# Patient Record
Sex: Male | Born: 1945 | Hispanic: No | Marital: Married | Smoking: Current every day smoker
Health system: Southern US, Community
[De-identification: ages and names within clinical notes are randomized; demographics above are authoritative.]

## PROBLEM LIST (undated history)

## (undated) DIAGNOSIS — J189 Pneumonia, unspecified organism: Secondary | ICD-10-CM

## (undated) DIAGNOSIS — I2510 Atherosclerotic heart disease of native coronary artery without angina pectoris: Secondary | ICD-10-CM

## (undated) DIAGNOSIS — K831 Obstruction of bile duct: Secondary | ICD-10-CM

## (undated) DIAGNOSIS — C801 Malignant (primary) neoplasm, unspecified: Secondary | ICD-10-CM

## (undated) DIAGNOSIS — E7800 Pure hypercholesterolemia, unspecified: Secondary | ICD-10-CM

## (undated) DIAGNOSIS — K5900 Constipation, unspecified: Secondary | ICD-10-CM

## (undated) DIAGNOSIS — I10 Essential (primary) hypertension: Secondary | ICD-10-CM

## (undated) DIAGNOSIS — I219 Acute myocardial infarction, unspecified: Secondary | ICD-10-CM

## (undated) HISTORY — PX: CORONARY STENT PLACEMENT: SHX1402

## (undated) HISTORY — DX: Malignant (primary) neoplasm, unspecified: C80.1

## (undated) HISTORY — PX: APPENDECTOMY: SHX54

## (undated) HISTORY — PX: CORONARY ANGIOPLASTY: SHX604

---

## 2008-11-24 ENCOUNTER — Emergency Department (HOSPITAL_COMMUNITY): Admission: EM | Admit: 2008-11-24 | Discharge: 2008-11-24 | Payer: Self-pay | Admitting: Family Medicine

## 2009-12-29 ENCOUNTER — Ambulatory Visit: Payer: Self-pay | Admitting: Family Medicine

## 2009-12-29 ENCOUNTER — Encounter (INDEPENDENT_AMBULATORY_CARE_PROVIDER_SITE_OTHER): Payer: Self-pay | Admitting: Family Medicine

## 2009-12-29 LAB — CONVERTED CEMR LAB
ALT: 14 units/L (ref 0–53)
Alkaline Phosphatase: 79 units/L (ref 39–117)
Basophils Absolute: 0 10*3/uL (ref 0.0–0.1)
Basophils Relative: 0 % (ref 0–1)
Creatinine, Ser: 0.91 mg/dL (ref 0.40–1.50)
Glucose, Bld: 83 mg/dL (ref 70–99)
LDL Cholesterol: 93 mg/dL (ref 0–99)
MCHC: 34.1 g/dL (ref 30.0–36.0)
Neutro Abs: 3.9 10*3/uL (ref 1.7–7.7)
Neutrophils Relative %: 51 % (ref 43–77)
PSA: 1.15 ng/mL (ref 0.10–4.00)
RDW: 14 % (ref 11.5–15.5)
Sodium: 134 meq/L — ABNORMAL LOW (ref 135–145)
Total Bilirubin: 0.5 mg/dL (ref 0.3–1.2)
Total CHOL/HDL Ratio: 3.7
Total Protein: 7.9 g/dL (ref 6.0–8.3)
Triglycerides: 183 mg/dL — ABNORMAL HIGH (ref <150)
VLDL: 37 mg/dL (ref 0–40)

## 2012-05-01 ENCOUNTER — Encounter (HOSPITAL_COMMUNITY): Payer: Self-pay

## 2012-05-01 ENCOUNTER — Inpatient Hospital Stay (HOSPITAL_COMMUNITY)
Admission: EM | Admit: 2012-05-01 | Discharge: 2012-05-23 | DRG: 438 | Disposition: A | Discharge status: Home Health | Payer: Medicaid Other | Attending: Cardiovascular Disease | Admitting: Cardiovascular Disease

## 2012-05-01 ENCOUNTER — Emergency Department (HOSPITAL_COMMUNITY): Payer: Medicaid Other

## 2012-05-01 DIAGNOSIS — E871 Hypo-osmolality and hyponatremia: Secondary | ICD-10-CM | POA: Diagnosis present

## 2012-05-01 DIAGNOSIS — T50995A Adverse effect of other drugs, medicaments and biological substances, initial encounter: Secondary | ICD-10-CM | POA: Diagnosis present

## 2012-05-01 DIAGNOSIS — R7402 Elevation of levels of lactic acid dehydrogenase (LDH): Secondary | ICD-10-CM | POA: Diagnosis present

## 2012-05-01 DIAGNOSIS — I10 Essential (primary) hypertension: Secondary | ICD-10-CM | POA: Diagnosis present

## 2012-05-01 DIAGNOSIS — R634 Abnormal weight loss: Secondary | ICD-10-CM | POA: Diagnosis present

## 2012-05-01 DIAGNOSIS — K831 Obstruction of bile duct: Secondary | ICD-10-CM | POA: Diagnosis present

## 2012-05-01 DIAGNOSIS — R17 Unspecified jaundice: Secondary | ICD-10-CM | POA: Diagnosis present

## 2012-05-01 DIAGNOSIS — Q619 Cystic kidney disease, unspecified: Secondary | ICD-10-CM

## 2012-05-01 DIAGNOSIS — E785 Hyperlipidemia, unspecified: Secondary | ICD-10-CM | POA: Diagnosis present

## 2012-05-01 DIAGNOSIS — F17200 Nicotine dependence, unspecified, uncomplicated: Secondary | ICD-10-CM | POA: Diagnosis present

## 2012-05-01 DIAGNOSIS — R748 Abnormal levels of other serum enzymes: Secondary | ICD-10-CM | POA: Diagnosis present

## 2012-05-01 DIAGNOSIS — K4090 Unilateral inguinal hernia, without obstruction or gangrene, not specified as recurrent: Secondary | ICD-10-CM | POA: Diagnosis present

## 2012-05-01 DIAGNOSIS — K5730 Diverticulosis of large intestine without perforation or abscess without bleeding: Secondary | ICD-10-CM | POA: Diagnosis present

## 2012-05-01 DIAGNOSIS — K838 Other specified diseases of biliary tract: Secondary | ICD-10-CM | POA: Diagnosis present

## 2012-05-01 DIAGNOSIS — R7401 Elevation of levels of liver transaminase levels: Secondary | ICD-10-CM | POA: Diagnosis present

## 2012-05-01 DIAGNOSIS — Z6825 Body mass index (BMI) 25.0-25.9, adult: Secondary | ICD-10-CM

## 2012-05-01 DIAGNOSIS — Z79899 Other long term (current) drug therapy: Secondary | ICD-10-CM

## 2012-05-01 DIAGNOSIS — E8809 Other disorders of plasma-protein metabolism, not elsewhere classified: Secondary | ICD-10-CM | POA: Diagnosis present

## 2012-05-01 DIAGNOSIS — K859 Acute pancreatitis without necrosis or infection, unspecified: Principal | ICD-10-CM | POA: Diagnosis present

## 2012-05-01 DIAGNOSIS — E875 Hyperkalemia: Secondary | ICD-10-CM | POA: Diagnosis present

## 2012-05-01 DIAGNOSIS — K5900 Constipation, unspecified: Secondary | ICD-10-CM | POA: Diagnosis not present

## 2012-05-01 DIAGNOSIS — D490 Neoplasm of unspecified behavior of digestive system: Secondary | ICD-10-CM | POA: Diagnosis present

## 2012-05-01 DIAGNOSIS — N179 Acute kidney failure, unspecified: Secondary | ICD-10-CM | POA: Diagnosis not present

## 2012-05-01 DIAGNOSIS — E861 Hypovolemia: Secondary | ICD-10-CM | POA: Diagnosis present

## 2012-05-01 DIAGNOSIS — I2510 Atherosclerotic heart disease of native coronary artery without angina pectoris: Secondary | ICD-10-CM | POA: Diagnosis present

## 2012-05-01 DIAGNOSIS — E46 Unspecified protein-calorie malnutrition: Secondary | ICD-10-CM | POA: Diagnosis present

## 2012-05-01 DIAGNOSIS — K8020 Calculus of gallbladder without cholecystitis without obstruction: Secondary | ICD-10-CM | POA: Diagnosis present

## 2012-05-01 DIAGNOSIS — I959 Hypotension, unspecified: Secondary | ICD-10-CM | POA: Diagnosis present

## 2012-05-01 DIAGNOSIS — K315 Obstruction of duodenum: Secondary | ICD-10-CM | POA: Diagnosis present

## 2012-05-01 HISTORY — DX: Atherosclerotic heart disease of native coronary artery without angina pectoris: I25.10

## 2012-05-01 HISTORY — DX: Essential (primary) hypertension: I10

## 2012-05-01 HISTORY — DX: Pure hypercholesterolemia, unspecified: E78.00

## 2012-05-01 LAB — CBC WITH DIFFERENTIAL/PLATELET
Basophils Relative: 0 % (ref 0–1)
Eosinophils Absolute: 0.2 10*3/uL (ref 0.0–0.7)
Eosinophils Relative: 2 % (ref 0–5)
MCH: 28.3 pg (ref 26.0–34.0)
MCHC: 34.6 g/dL (ref 30.0–36.0)
MCV: 81.9 fL (ref 78.0–100.0)
Monocytes Relative: 14 % — ABNORMAL HIGH (ref 3–12)
Neutrophils Relative %: 67 % (ref 43–77)
Platelets: 192 10*3/uL (ref 150–400)

## 2012-05-01 LAB — COMPREHENSIVE METABOLIC PANEL
Albumin: 3.7 g/dL (ref 3.5–5.2)
Alkaline Phosphatase: 400 U/L — ABNORMAL HIGH (ref 39–117)
BUN: 18 mg/dL (ref 6–23)
Calcium: 9.6 mg/dL (ref 8.4–10.5)
GFR calc Af Amer: 88 mL/min — ABNORMAL LOW (ref >90)
Potassium: 3.8 mEq/L (ref 3.5–5.1)
Sodium: 134 mEq/L — ABNORMAL LOW (ref 135–145)
Total Protein: 7.8 g/dL (ref 6.0–8.3)

## 2012-05-01 LAB — LIPASE, BLOOD: Lipase: >3000 U/L — ABNORMAL HIGH (ref 11–59)

## 2012-05-01 MED ORDER — ZOLPIDEM TARTRATE 5 MG PO TABS: 5.0000 mg | ORAL_TABLET | Freq: Every evening | ORAL | Status: DC | PRN

## 2012-05-01 MED ORDER — DEXTROSE-NACL 5-0.45 % IV SOLN
INTRAVENOUS | Status: DC
  Administered 2012-05-01 – 2012-05-02 (×3): via INTRAVENOUS

## 2012-05-01 MED ORDER — ONDANSETRON HCL 4 MG/2ML IJ SOLN
4.0000 mg | Freq: Four times a day (QID) | INTRAMUSCULAR | Status: DC | PRN
  Administered 2012-05-01 – 2012-05-04 (×3): 4 mg via INTRAVENOUS
  Filled 2012-05-01 (×3): qty 2

## 2012-05-01 MED ORDER — LISINOPRIL 20 MG PO TABS
20.0000 mg | ORAL_TABLET | Freq: Every day | ORAL | Status: DC
  Administered 2012-05-01 – 2012-05-07 (×7): 20 mg via ORAL
  Filled 2012-05-01 (×8): qty 1

## 2012-05-01 MED ORDER — SODIUM CHLORIDE 0.9 % IJ SOLN
3.0000 mL | Freq: Two times a day (BID) | INTRAMUSCULAR | Status: DC
  Administered 2012-05-01 – 2012-05-23 (×8): 3 mL via INTRAVENOUS

## 2012-05-01 MED ORDER — ONDANSETRON HCL 4 MG PO TABS: 4.0000 mg | ORAL_TABLET | Freq: Four times a day (QID) | ORAL | Status: DC | PRN

## 2012-05-01 MED ORDER — ASPIRIN 81 MG PO CHEW
324.0000 mg | CHEWABLE_TABLET | Freq: Once | ORAL | Status: AC
  Administered 2012-05-01: 324 mg via ORAL
  Filled 2012-05-01: qty 4

## 2012-05-01 MED ORDER — SODIUM CHLORIDE 0.9 % IV BOLUS (SEPSIS)
500.0000 mL | Freq: Once | INTRAVENOUS | Status: AC
  Administered 2012-05-01: 500 mL via INTRAVENOUS

## 2012-05-01 MED ORDER — IOHEXOL 300 MG/ML  SOLN
100.0000 mL | Freq: Once | INTRAMUSCULAR | Status: AC | PRN
  Administered 2012-05-01: 100 mL via INTRAVENOUS

## 2012-05-01 MED ORDER — OXYCODONE HCL 5 MG PO TABS
5.0000 mg | ORAL_TABLET | ORAL | Status: DC | PRN
  Administered 2012-05-01 – 2012-05-23 (×15): 5 mg via ORAL
  Filled 2012-05-01 (×15): qty 1

## 2012-05-01 NOTE — ED Notes (Signed)
Pt complains of epigastric pain and shortness of breath since last night, no vomiting but states that he feels nauseated

## 2012-05-01 NOTE — ED Provider Notes (Signed)
History     CSN: [id number]  Arrival date & time 05/01/12  1322   First MD Initiated Contact with Patient 05/01/12 1355      Chief Complaint  Patient presents with  . Abdominal Pain  . Shortness of Breath    (Consider location/radiation/quality/duration/timing/severity/associated sxs/prior treatment) HPI Pt reports several days of increasing, moderate aching epigastric abdominal pain occasionally radiating into his chest, associated with nausea but no vomiting and abdominal bloating. No change in bowels. Pain occasionally moves into his chest and having some mild itching. He went to see his PCP recently for itching all over and given a course of prednisone. Denies any cough or fever.   Past Medical History  Diagnosis Date  . Hypertension   . High cholesterol     History reviewed. No pertinent past surgical history.  History reviewed. No pertinent family history.  History  Substance Use Topics  . Smoking status: Not on file  . Smokeless tobacco: Not on file  . Alcohol Use: No      Review of Systems All other systems reviewed and are negative except as noted in HPI.   Allergies  Review of patient's allergies indicates no known allergies.  Home Medications   Current Outpatient Rx  Name Route Sig Dispense Refill  . ATORVASTATIN CALCIUM 10 MG PO TABS Oral Take 10 mg by mouth daily.    [name] LISINOPRIL 20 MG PO TABS Oral Take 20 mg by mouth daily.    [name] PRAVASTATIN SODIUM 40 MG PO TABS Oral Take 40 mg by mouth daily.    [name] PREDNISONE 10 MG PO TABS Oral Take 10-20 mg by mouth daily. Taper dosing     Started at 20mg  for first four days then 10mg  for rest of days.     Was taking for itching.      BP 160/72  Pulse 82  Temp 97.8 F (36.6 C) (Oral)  Resp 20  SpO2 99%  Physical Exam  Nursing note and vitals reviewed. Constitutional: He is oriented to person, place, and time. He appears well-developed and well-nourished.  HENT:  Head: Normocephalic and atraumatic.  Eyes:  EOM are normal. Pupils are equal, round, and reactive to light.  Neck: Normal range of motion. Neck supple.  Cardiovascular: Normal rate, normal heart sounds and intact distal pulses.   Pulmonary/Chest: Effort normal and breath sounds normal.  Abdominal: Bowel sounds are normal. He exhibits no distension. There is tenderness (epigastric tenderness). There is no rebound and no guarding.  Musculoskeletal: Normal range of motion. He exhibits no edema and no tenderness.  Neurological: He is alert and oriented to person, place, and time. He has normal strength. No cranial nerve deficit or sensory deficit.  Skin: Skin is warm and dry. No rash noted.  Psychiatric: He has a normal mood and affect.    ED Course  Procedures (including critical care time)  Labs Reviewed  CBC WITH DIFFERENTIAL - Abnormal; Notable for the following:    RBC 6.18 (*)     Hemoglobin 17.5 (*)     Monocytes Relative 14 (*)     Monocytes Absolute 1.5 (*)     All other components within normal limits  COMPREHENSIVE METABOLIC PANEL - Abnormal; Notable for the following:    Sodium 134 (*)     AST 72 (*)     ALT 216 (*)     Alkaline Phosphatase 400 (*)     Total Bilirubin 2.9 (*)     GFR calc non Af  Amer 76 (*)     GFR calc Af Amer 88 (*)     All other components within normal limits  TROPONIN I  LIPASE, BLOOD   Dg Abd Acute W/chest  05/01/2012  *RADIOLOGY REPORT*  Clinical Data: Chest pain, abdominal pain  ACUTE ABDOMEN SERIES (ABDOMEN 2 VIEW & CHEST 1 VIEW)  Comparison: None.  Findings: Normal cardiac silhouette.  A fine reticular nodular pattern of left or right lower lobe.  Lungs are clear.  Lungs are hyperinflated.  There are no dilated loops of large or small bowel. Gas filled loops of large and small bowel.  There is gas in the rectum.  No pathologic calcifications.  IMPRESSION:  1. Reticular nodular pattern in the lower lobes suggest interstitial edema or infection.  Fine pulmonary edema could have similar  pattern. 2.  No evidence of bowel obstruction.  Gas filled loops of large and small bowel.   Original Report Authenticated By: Genevive Bi, M.D.      No diagnosis found.    MDM   Date: 05/01/2012  Rate: 84  Rhythm: normal sinus rhythm  QRS Axis: normal  Intervals: normal  ST/T Wave abnormalities: nonspecific T wave changes  Conduction Disutrbances:none  Narrative Interpretation:   Old EKG Reviewed: none available   CMP shows elevation in liver enzymes and bilirubin. Awaiting lipase which is elevated and being diluted in the Lab. Will send for CT. Discussed with Dr. Algie Coffer who will come to the ED to evaluate the patient.        Charles B. Bernette Mayers, MD 05/01/12 1538

## 2012-05-01 NOTE — ED Notes (Signed)
Dr Sheldon at bedside  

## 2012-05-01 NOTE — H&P (Signed)
Colin Castro is an 66 y.o. male.   Chief Complaint: Abdominal Pain HPI: Abdominal tightness and nausea x 1 week. No fever. Denies alcohol, fat intake or prior history of GB or pancreatic disease. No fever or cough or diarrhea.  Past Medical History  Diagnosis Date  . Hypertension   . High cholesterol       History reviewed. No pertinent past surgical history.  History reviewed. No pertinent family history. Social History:  reports that he has been smoking Cigarettes.  He has a 12 pack-year smoking history. He has never used smokeless tobacco. He reports that he does not drink alcohol or use illicit drugs.  Allergies: No Known Allergies   Results for orders placed during the hospital encounter of 05/01/12 (from the past 48 hour(s))  CBC WITH DIFFERENTIAL     Status: Abnormal   Collection Time   05/01/12  2:25 PM      Component Value Range Comment   WBC 10.3  4.0 - 10.5 K/uL    RBC 6.18 (*) 4.22 - 5.81 MIL/uL    Hemoglobin 17.5 (*) 13.0 - 17.0 g/dL    HCT 96.0  45.4 - 09.8 %    MCV 81.9  78.0 - 100.0 fL    MCH 28.3  26.0 - 34.0 pg    MCHC 34.6  30.0 - 36.0 g/dL    RDW 11.9  14.7 - 82.9 %    Platelets 192  150 - 400 K/uL    Neutrophils Relative 67  43 - 77 %    Neutro Abs 6.8  1.7 - 7.7 K/uL    Lymphocytes Relative 17  12 - 46 %    Lymphs Abs 1.8  0.7 - 4.0 K/uL    Monocytes Relative 14 (*) 3 - 12 %    Monocytes Absolute 1.5 (*) 0.1 - 1.0 K/uL    Eosinophils Relative 2  0 - 5 %    Eosinophils Absolute 0.2  0.0 - 0.7 K/uL    Basophils Relative 0  0 - 1 %    Basophils Absolute 0.0  0.0 - 0.1 K/uL   COMPREHENSIVE METABOLIC PANEL     Status: Abnormal   Collection Time   05/01/12  2:25 PM      Component Value Range Comment   Sodium 134 (*) 135 - 145 mEq/L    Potassium 3.8  3.5 - 5.1 mEq/L    Chloride 97  96 - 112 mEq/L    CO2 22  19 - 32 mEq/L    Glucose, Bld 82  70 - 99 mg/dL    BUN 18  6 - 23 mg/dL    Creatinine, Ser 5.62  0.50 - 1.35 mg/dL    Calcium 9.6  8.4 - 13.0  mg/dL    Total Protein 7.8  6.0 - 8.3 g/dL    Albumin 3.7  3.5 - 5.2 g/dL    AST 72 (*) 0 - 37 U/L    ALT 216 (*) 0 - 53 U/L    Alkaline Phosphatase 400 (*) 39 - 117 U/L    Total Bilirubin 2.9 (*) 0.3 - 1.2 mg/dL    GFR calc non Af Amer 76 (*) >90 mL/min    GFR calc Af Amer 88 (*) >90 mL/min   LIPASE, BLOOD     Status: Abnormal   Collection Time   05/01/12  2:25 PM      Component Value Range Comment   Lipase >3000 (*) 11 - 59 U/L  TROPONIN I     Status: Normal   Collection Time   05/01/12  2:25 PM      Component Value Range Comment   Troponin I <0.30  <0.30 ng/mL    Dg Abd Acute W/chest  05/01/2012  *RADIOLOGY REPORT*  Clinical Data: Chest pain, abdominal pain  ACUTE ABDOMEN SERIES (ABDOMEN 2 VIEW & CHEST 1 VIEW)  Comparison: None.  Findings: Normal cardiac silhouette.  A fine reticular nodular pattern of left or right lower lobe.  Lungs are clear.  Lungs are hyperinflated.  There are no dilated loops of large or small bowel. Gas filled loops of large and small bowel.  There is gas in the rectum.  No pathologic calcifications.  IMPRESSION:  1. Reticular nodular pattern in the lower lobes suggest interstitial edema or infection.  Fine pulmonary edema could have similar pattern. 2.  No evidence of bowel obstruction.  Gas filled loops of large and small bowel.   Original Report Authenticated By: Genevive Bi, M.D.     @ROS @ No weight gain or loss. No fever, wears reading glasses. No chest pain. No hepatitis. No gi bleed. No kidney stone. No CVA. No seizure disorder or psychiatric admission.   Blood pressure 160/72, pulse 82, temperature 97.8 F (36.6 C), temperature source Oral, resp. rate 20, SpO2 99.00%. Physical Exan: Constitutional: He is oriented to person, place, and time. He appears well-developed and well-nourished.  HENT:  Head: Normocephalic and atraumatic. Eyes: Manson Passey, EOM are normal. Pupils are equal, round, and reactive to light.  Neck: Normal range of motion. Neck  supple.  Cardiovascular: Normal rate, normal heart sounds and intact distal pulses.  Pulmonary/Chest: Effort normal and breath sounds normal.  Abdominal: Bowel sounds are normal. There is tenderness (epigastric tenderness). There is no rebound and no guarding.   Musculoskeletal: Normal range of motion. No edema and no tenderness.  Neurological: He is alert and oriented to person, place, and time. He has normal strength. No cranial nerve deficit or sensory deficit.  Skin: Skin is warm and dry. No rash noted.  Psychiatric: He has a normal mood and affect.   Assessment/Plan Acute pancreatitis R/O GB pancreatitis  Admit, IV fluids.  Estephani Popper S 05/01/2012, 5:32 PM

## 2012-05-01 NOTE — Progress Notes (Signed)
[facility] ED CM noted cm consult from admission RN for assistance because pt does not have insurance. CM spoke with pt who confirms he has an active orange card and sees Dr Algie Coffer  Pt reports no issues with obtaining his medications Male visitor assisted with translation and providing information for pt.

## 2012-05-01 NOTE — ED Notes (Signed)
Pt finishing second cup of CT contrast. Informed of plan of care. Verbalizes understanding and agreement. Urinal provided at pt request. Family friend at bedside.

## 2012-05-01 NOTE — ED Notes (Signed)
Report called to [facility] RN

## 2012-05-01 NOTE — ED Notes (Signed)
Attempted to call report x2 at 1840. Sec stated RN was on another line. Held for RN for . No one ever answered phone. Will attempt report x3 at shift change.

## 2012-05-01 NOTE — ED Notes (Signed)
Attempted to call report x1, RN unavailable. Awaiting return call.

## 2012-05-01 NOTE — ED Notes (Signed)
Pt reports epigastric pain x5-6 days. Sometimes radiates to chest or generalized abd. Denies n/v, shob, CP.

## 2012-05-02 LAB — BASIC METABOLIC PANEL
BUN: 20 mg/dL (ref 6–23)
CO2: 24 mEq/L (ref 19–32)
Chloride: 99 mEq/L (ref 96–112)
Creatinine, Ser: 1.18 mg/dL (ref 0.50–1.35)
Glucose, Bld: 98 mg/dL (ref 70–99)

## 2012-05-02 NOTE — Consult Note (Signed)
[facility] Consultation Note  Referring Provider = Primary Care Physician:  [name], MD  Reason for Consultation:  pancreatitis  HPI: Colin Castro is a 66 y.o. male whom we've been asked to see for pancreatitis and abnormal CT scan.  Patient was in static state of health until about one week ago.  At that time, he began having acute onset of epigastric pain and nausea/vomiting.  Pain at present has resolved.  He has no change in bowel habits, blood in stool.  His appetite has been a bit diminished, and he has lost about 5 lbs over the past several weeks.  CT showed pancreatic and biliary ductal dilatation, large gallstone, but no obvious inflammatory changes of pancreatitis despite markedly elevated pancreatic enzyme level.  He has never had pancreatitis before.  He does not consume alcohol.   Past Medical History  Diagnosis Date  . Hypertension   . High cholesterol     History reviewed. No pertinent past surgical history.  Prior to Admission medications   Medication Sig Start Date End Date Taking? Authorizing Provider  atorvastatin (LIPITOR) 10 MG tablet Take 10 mg by mouth daily.   Yes Historical Provider, MD  lisinopril (PRINIVIL,ZESTRIL) 20 MG tablet Take 20 mg by mouth daily.   Yes Historical Provider, MD  pravastatin (PRAVACHOL) 40 MG tablet Take 40 mg by mouth daily.   Yes Historical Provider, MD  predniSONE (DELTASONE) 10 MG tablet Take 10-20 mg by mouth daily. Taper dosing     Started at 20mg  for first four days then 10mg  for rest of days.     Was taking for itching.   Yes Historical Provider, MD    Current Facility-Administered Medications  Medication Dose Route Frequency Provider Last Rate Last Dose  . dextrose 5 %-0.45 % sodium chloride infusion   Intravenous Continuous [name], MD 100 mL/hr at 05/02/12 1610    . iohexol (OMNIPAQUE) 300 MG/ML solution 100 mL  100 mL Intravenous Once PRN Medication Radiologist, MD   100 mL at 05/01/12 1743  .  lisinopril (PRINIVIL,ZESTRIL) tablet 20 mg  20 mg Oral Daily [name], MD   20 mg at 05/02/12 0946  . ondansetron (ZOFRAN) tablet 4 mg  4 mg Oral Q6H PRN [name], MD       Or  . ondansetron (ZOFRAN) injection 4 mg  4 mg Intravenous Q6H PRN [name], MD   4 mg at 05/01/12 2102  . oxyCODONE (Oxy IR/ROXICODONE) immediate release tablet 5 mg  5 mg Oral Q4H PRN [name], MD   5 mg at 05/02/12 0438  . sodium chloride 0.9 % injection 3 mL  3 mL Intravenous Q12H [name], MD   3 mL at 05/01/12 2040  . zolpidem (AMBIEN) tablet 5 mg  5 mg Oral QHS PRN [name], MD        Allergies as of 05/01/2012  . (No Known Allergies)    History reviewed. No pertinent family history.  History   Social History  . Marital Status: Married    Spouse Name: N/A    Number of Children: N/A  . Years of Education: N/A   Occupational History  . Not on file.   Social History Main Topics  . Smoking status: Current Every Day Smoker -- 0.2 packs/day for 48 years    Types: Cigarettes  . Smokeless tobacco: Never Used  . Alcohol Use: No  . Drug Use: No  . Sexually Active: No  Other Topics Concern  . Not on file   Social History Narrative  . No narrative on file    Review of Systems: As per HPI; all others negative.  Physical Exam: Vital signs in last 24 hours: Temp:  [97.8 F (36.6 C)-98.4 F (36.9 C)] 98.4 F (36.9 C) (09/13 1449) Pulse Rate:  [59-61] 60  (09/13 1452) Resp:  [18-22] 18  (09/13 1449) BP: (96-151)/(49-83) 151/61 mmHg (09/13 1452) SpO2:  [95 %-100 %] 95 % (09/13 1452) Weight:  [67.8 kg (149 lb 7.6 oz)-70.5 kg (155 lb 6.8 oz)] 70.5 kg (155 lb 6.8 oz) (09/13 0538) Last BM Date: 04/30/12 General:   Alert,  Bit cachectic-appearing, older-appearing than stated age, is in NAD Head:  Normocephalic and atraumatic. Eyes:  Sclera clear; slight scleral icterus.   Conjunctiva slightly pale Ears:  Normal auditory acuity. Nose:  No deformity, discharge,  or  lesions. Mouth:  No deformity or lesions.  Oropharynx pink & somewhat dry.  Poor dentition, with couple very loose teeth Neck:  Supple Lungs:  Clear throughout to auscultation.   No wheezes, crackles, or rhonchi. No acute distress. Heart:  Regular rate and rhythm; no murmurs, clicks, rubs,  or gallops. Abdomen:  Soft, nondistended. No significant tenderness, even to deep palpation.  Active bowel sounds. No masses, hepatosplenomegaly or hernias noted. No peritonitis.    Msk:  Symmetrical without gross deformities. Normal posture. Pulses:  Normal pulses noted. Extremities:  Without clubbing or edema. Neurologic:  Alert and  oriented x4;  grossly normal neurologically. Cervical Nodes:  No significant cervical adenopathy. Psych:  Alert and cooperative. Normal mood and affect.   Lab Results:  Basename 05/01/12 1425  WBC 10.3  HGB 17.5*  HCT 50.6  PLT 192   BMET  Basename 05/02/12 0450 05/01/12 1425  NA 133* 134*  K 3.9 3.8  CL 99 97  CO2 24 22  GLUCOSE 98 82  BUN 20 18  CREATININE 1.18 1.01  CALCIUM 8.7 9.6   LFT  Basename 05/01/12 1425  PROT 7.8  ALBUMIN 3.7  AST 72*  ALT 216*  ALKPHOS 400*  BILITOT 2.9*  BILIDIR --  IBILI --   PT/INR No results found for this basename: LABPROT:2,INR:2 in the last 72 hours  Studies/Results: Ct Abdomen Pelvis W Contrast  05/01/2012  *RADIOLOGY REPORT*  Clinical Data: Epigastric pain, pancreatitis.  CT ABDOMEN AND PELVIS WITH CONTRAST  Technique:  Multidetector CT imaging of the abdomen and pelvis was performed following the standard protocol during bolus administration of intravenous contrast.  Contrast: OMNIPAQUE IOHEXOL 300 MG/ML  SOLN plain films of the abdomen 05/01/2012  Comparison: Plain films of the abdomen 05/01/2012  Findings: The heart is normal in size.  Possible coronary stent versus coronary calcification.  There is dilatation of the intrahepatic, extrahepatic biliary ducts and the pancreatic duct, which taper abruptly  at the level of the ampulla.  This raises the question of and ampullary lesion such as a benign or malignant stricture.  Large gallstone is present in the gallbladder.  No focal hepatic lesion identified.  No focal abnormality identified within the spleen, pancreas, or adrenal glands.  Small low attenuation lesions are identified within the kidneys, most consistent with cysts.  No evidence for hydronephrosis.  The stomach and small bowel loops have a normal appearance.  There are scattered colonic diverticula but no CT evidence for acute diverticulitis.  The appendix is not seen.  No inflammatory changes are seen is the chest presence of acute appendicitis.  No retroperitoneal or mesenteric adenopathy.  There is atherosclerotic calcification of the aorta.  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 There is a small right inguinal hernia, containing anterior aspect of the bladder and mesenteric fat.  IMPRESSION:  1.  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 2.  No contour deforming or abnormal density mass within the pancreatic head.  Further evaluation with ERCP may be helpful. 3.  Coronary stent versus coronary calcification. 4.  Cholelithiasis. 5.  Probable small renal cysts. 6.  Diverticulosis. 7.  Right inguinal hernia containing a portion of the bladder.   Original Report Authenticated By: Patterson Hammersmith, M.D.    Dg Abd Acute W/chest  05/01/2012  *RADIOLOGY REPORT*  Clinical Data: Chest pain, abdominal pain  ACUTE ABDOMEN SERIES (ABDOMEN 2 VIEW & CHEST 1 VIEW)  Comparison: None.  Findings: Normal cardiac silhouette.  A fine reticular nodular pattern of left or right lower lobe.  Lungs are clear.  Lungs are hyperinflated.  There are no dilated loops of large or small bowel. Gas filled loops of large and small bowel.  There is gas in the rectum.  No pathologic calcifications.   IMPRESSION:  1. Reticular nodular pattern in the lower lobes suggest interstitial edema or infection.  Fine pulmonary edema could have similar pattern. 2.  No evidence of bowel obstruction.  Gas filled loops of large and small bowel.   Original Report Authenticated By: Genevive Bi, M.D.     Impression:  1.  Pancreatitis.  Etiology most consistent with gallstone etiology.  Improving. 2.  Elevated LFTs.  Likely choledocholithiasis-related.  Resolved pain argues in favor of either stone passage or "ball-valving" and presently non-obstructing stone.  Ampullary or diminutive pancreatic mass seems quite unlikely, but can't be definitively excluded. 3.  Gallstones on CT scan.  Plan:  1.  Trial of clear liquids. 2.  Continue Intravenous fluids, narcotic analgesia as supportive care for pancreatitis.   3.  Follow LFTs; if worsening of LFTs will need to consider MRCP versus ERCP. 4.  Surgical consult this admission; will wait for now until we see what trend his LFTs follow. 5.  Thank you for the consult.  One of my partners will follow over the weekend.   LOS: 1 day   Yamilett Anastos M  05/02/2012, 3:47 PM

## 2012-05-02 NOTE — Progress Notes (Signed)
Subjective:  Decreasing abdominal discomfort. Afebrile.  Objective:  Vital Signs in the last 24 hours: Temp:  [97.8 F (36.6 C)-98.1 F (36.7 C)] 98.1 F (36.7 C) (09/13 0538) Pulse Rate:  [59-61] 61  (09/13 0538) Cardiac Rhythm:  [-] Normal sinus rhythm (09/12 2026) Resp:  [18-22] 18  (09/13 0538) BP: (105-141)/(49-83) 108/60 mmHg (09/13 0605) SpO2:  [98 %-100 %] 98 % (09/13 0538) Weight:  [67.8 kg (149 lb 7.6 oz)-70.5 kg (155 lb 6.8 oz)] 70.5 kg (155 lb 6.8 oz) (09/13 0538)  Physical Exam: BP Readings from Last 1 Encounters:  05/02/12 108/60    Wt Readings from Last 1 Encounters:  05/02/12 70.5 kg (155 lb 6.8 oz)    Weight change:   HEENT: [place]/AT, Eyes-Brown, PERL, EOMI, Conjunctiva-Pink, Sclera-Non-icteric Neck: No JVD, No bruit, Trachea midline. Lungs:  Clear, Bilateral. Cardiac:  Regular rhythm, normal S1 and S2, no S3.  Abdomen:  Soft, mild epigastric tenderness. Extremities:  No edema present. No cyanosis. No clubbing. CNS: AxOx3, Cranial nerves grossly intact, moves all 4 extremities. Right handed. Skin: Warm and dry.   Intake/Output from previous day: 09/12 0701 - 09/13 0700 In: 950 [I.V.:950] Out: 1200 [Urine:1200]    Lab Results: BMET    Component Value Date/Time   NA 133* 05/02/2012 0450   K 3.9 05/02/2012 0450   CL 99 05/02/2012 0450   CO2 24 05/02/2012 0450   GLUCOSE 98 05/02/2012 0450   BUN 20 05/02/2012 0450   CREATININE 1.18 05/02/2012 0450   CALCIUM 8.7 05/02/2012 0450   GFRNONAA 63* 05/02/2012 0450   GFRAA 73* 05/02/2012 0450   CBC    Component Value Date/Time   WBC 10.3 05/01/2012 1425   RBC 6.18* 05/01/2012 1425   HGB 17.5* 05/01/2012 1425   HCT 50.6 05/01/2012 1425   PLT 192 05/01/2012 1425   MCV 81.9 05/01/2012 1425   MCH 28.3 05/01/2012 1425   MCHC 34.6 05/01/2012 1425   RDW 15.4 05/01/2012 1425   LYMPHSABS 1.8 05/01/2012 1425   MONOABS 1.5* 05/01/2012 1425   EOSABS 0.2 05/01/2012 1425   BASOSABS 0.0 05/01/2012 1425   CARDIAC ENZYMES Lab Results   Component Value Date   TROPONINI <0.30 05/01/2012    Assessment/Plan:  Patient Active Hospital Problem List: Acute Pancreatitis  R/O Ampullary stricture Diverticulosis Right inguinal hernia  Continue IV fluids. Refer to GI.   LOS: 1 day    Orpah Cobb  MD  05/02/2012, 2:02 PM

## 2012-05-03 LAB — HEPATIC FUNCTION PANEL
ALT: 134 U/L — ABNORMAL HIGH (ref 0–53)
AST: 59 U/L — ABNORMAL HIGH (ref 0–37)
Bilirubin, Direct: 4.5 mg/dL — ABNORMAL HIGH (ref 0.0–0.3)
Total Bilirubin: 5.9 mg/dL — ABNORMAL HIGH (ref 0.3–1.2)

## 2012-05-03 LAB — BASIC METABOLIC PANEL
BUN: 12 mg/dL (ref 6–23)
Calcium: 8.7 mg/dL (ref 8.4–10.5)
Chloride: 98 mEq/L (ref 96–112)
Creatinine, Ser: 0.91 mg/dL (ref 0.50–1.35)
GFR calc Af Amer: >90 mL/min (ref >90)

## 2012-05-03 LAB — CBC
HCT: 41.4 % (ref 39.0–52.0)
MCH: 28.4 pg (ref 26.0–34.0)
MCV: 82.3 fL (ref 78.0–100.0)
RDW: 15.2 % (ref 11.5–15.5)
WBC: 8 10*3/uL (ref 4.0–10.5)

## 2012-05-03 MED ORDER — KCL IN DEXTROSE-NACL 20-5-0.45 MEQ/L-%-% IV SOLN
INTRAVENOUS | Status: DC
  Administered 2012-05-03: 1000 mL via INTRAVENOUS
  Administered 2012-05-07 – 2012-05-08 (×2): via INTRAVENOUS
  Filled 2012-05-03 (×12): qty 1000

## 2012-05-03 NOTE — Progress Notes (Signed)
Subjective:  Patient has abdominal pain worsened when he consumes clear liquids with one episode of vomiting last night. He is alert and oriented and does not appear to be in any distress.    Objective:  Vital Signs in the last 24 hours: Temp:  [98.3 F (36.8 C)-98.5 F (36.9 C)] 98.5 F (36.9 C) (09/14 0524) Pulse Rate:  [56-60] 56  (09/14 0524) Cardiac Rhythm:  [-] Sinus bradycardia (09/14 0800) Resp:  [18] 18  (09/13 1449) BP: (96-156)/(55-68) 116/68 mmHg (09/14 0524) SpO2:  [95 %-98 %] 96 % (09/14 0524) Weight:  [72.1 kg (158 lb 15.2 oz)] 72.1 kg (158 lb 15.2 oz) (09/14 0524)  Physical Exam: BP Readings from Last 1 Encounters:  05/03/12 116/68    Wt Readings from Last 1 Encounters:  05/03/12 72.1 kg (158 lb 15.2 oz)    Weight change: 4.3 kg (9 lb 7.7 oz)  HEENT: [place]/AT, Eyes-Brown, PERL, EOMI, Conjunctiva-Pink, Sclera-Non-icteric Neck: No JVD, No bruit, Trachea midline. Lungs:  Clear, Bilateral. Cardiac:  Regular rhythm, normal S1 and S2, no S3.  Abdomen:  Soft, epigastric tenderness. Extremities:  No edema present. No cyanosis. No clubbing. CNS: AxOx3, Cranial nerves grossly intact, moves all 4 extremities. Right handed. Skin: Warm and dry.   Intake/Output from previous day: 09/13 0701 - 09/14 0700 In: 800 [I.V.:800] Out: -     Lab Results: BMET    Component Value Date/Time   NA 131* 05/03/2012 0530   K 3.4* 05/03/2012 0530   CL 98 05/03/2012 0530   CO2 22 05/03/2012 0530   GLUCOSE 87 05/03/2012 0530   BUN 12 05/03/2012 0530   CREATININE 0.91 05/03/2012 0530   CALCIUM 8.7 05/03/2012 0530   GFRNONAA 87* 05/03/2012 0530   GFRAA >90 05/03/2012 0530   CBC    Component Value Date/Time   WBC 8.0 05/03/2012 0530   RBC 5.03 05/03/2012 0530   HGB 14.3 05/03/2012 0530   HCT 41.4 05/03/2012 0530   PLT 168 05/03/2012 0530   MCV 82.3 05/03/2012 0530   MCH 28.4 05/03/2012 0530   MCHC 34.5 05/03/2012 0530   RDW 15.2 05/03/2012 0530   LYMPHSABS 1.8 05/01/2012 1425   MONOABS 1.5*  05/01/2012 1425   EOSABS 0.2 05/01/2012 1425   BASOSABS 0.0 05/01/2012 1425   CARDIAC ENZYMES Lab Results  Component Value Date   TROPONINI <0.30 05/01/2012    Assessment/Plan:  Patient Active Hospital Problem List:  Acute Pancreatitis probably due to GB stone R/O Ampullary stricture  Diverticulosis  Right inguinal hernia  Follow with GI.   LOS: 2 days    Orpah Cobb  MD  05/03/2012, 11:50 AM

## 2012-05-03 NOTE — Progress Notes (Signed)
[facility] Progress Note  Subjective: Patient still complaining of abdominal pain worsened when he consumes clear liquids with one episode of vomiting last night. He is alert and oriented and does not appear to be in any distress.  Objective: Vital signs in last 24 hours: Temp:  [98.3 F (36.8 C)-98.5 F (36.9 C)] 98.5 F (36.9 C) (09/14 0524) Pulse Rate:  [56-60] 56  (09/14 0524) Resp:  [18] 18  (09/13 1449) BP: (96-156)/(55-68) 116/68 mmHg (09/14 0524) SpO2:  [95 %-98 %] 96 % (09/14 0524) Weight:  [72.1 kg (158 lb 15.2 oz)] 72.1 kg (158 lb 15.2 oz) (09/14 0524) Weight change: 4.3 kg (9 lb 7.7 oz)   PE: Abdomen soft with decreased bowel sounds. Mild/moderate tenderness throughout the upper abdomen  Lab Results: No results found for this or any previous visit (from the past 24 hour(s)).  Studies/Results: Ct Abdomen Pelvis W Contrast  05/01/2012  *RADIOLOGY REPORT*  Clinical Data: Epigastric pain, pancreatitis.  CT ABDOMEN AND PELVIS WITH CONTRAST  Technique:  Multidetector CT imaging of the abdomen and pelvis was performed following the standard protocol during bolus administration of intravenous contrast.  Contrast: OMNIPAQUE IOHEXOL 300 MG/ML  SOLN plain films of the abdomen 05/01/2012  Comparison: Plain films of the abdomen 05/01/2012  Findings: The heart is normal in size.  Possible coronary stent versus coronary calcification.  There is dilatation of the intrahepatic, extrahepatic biliary ducts and the pancreatic duct, which taper abruptly at the level of the ampulla.  This raises the question of and ampullary lesion such as a benign or malignant stricture.  Large gallstone is present in the gallbladder.  No focal hepatic lesion identified.  No focal abnormality identified within the spleen, pancreas, or adrenal glands.  Small low attenuation lesions are identified within the kidneys, most consistent with cysts.  No evidence for hydronephrosis.  The stomach and small  bowel loops have a normal appearance.  There are scattered colonic diverticula but no CT evidence for acute diverticulitis.  The appendix is not seen.  No inflammatory changes are seen is the chest presence of acute appendicitis.  No retroperitoneal or mesenteric adenopathy.  There is atherosclerotic calcification of the aorta.  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 There is a small right inguinal hernia, containing anterior aspect of the bladder and mesenteric fat.  IMPRESSION:  1.  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 2.  No contour deforming or abnormal density mass within the pancreatic head.  Further evaluation with ERCP may be helpful. 3.  Coronary stent versus coronary calcification. 4.  Cholelithiasis. 5.  Probable small renal cysts. 6.  Diverticulosis. 7.  Right inguinal hernia containing a portion of the bladder.   Original Report Authenticated By: Patterson Hammersmith, M.D.    Dg Abd Acute W/chest  05/01/2012  *RADIOLOGY REPORT*  Clinical Data: Chest pain, abdominal pain  ACUTE ABDOMEN SERIES (ABDOMEN 2 VIEW & CHEST 1 VIEW)  Comparison: None.  Findings: Normal cardiac silhouette.  A fine reticular nodular pattern of left or right lower lobe.  Lungs are clear.  Lungs are hyperinflated.  There are no dilated loops of large or small bowel. Gas filled loops of large and small bowel.  There is gas in the rectum.  No pathologic calcifications.  IMPRESSION:  1. Reticular nodular pattern in the lower lobes suggest interstitial edema or infection.  Fine pulmonary edema could  have similar pattern. 2.  No evidence of bowel obstruction.  Gas filled loops of large and small bowel.   Original Report Authenticated By: Genevive Bi, M.D.       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  Plan: 1. Increase IV fluid rate 2. Maintain a clear liquid for now 3. Recheck labs tomorrow Surgical consult eventually Depending on her course and laboratory trends consider MRCP or ERCP versus cholecystectomy with intraoperative cholangiogram.     Abb Gobert C 05/03/2012, 8:21 AM

## 2012-05-04 LAB — COMPREHENSIVE METABOLIC PANEL
BUN: 8 mg/dL (ref 6–23)
Calcium: 8.9 mg/dL (ref 8.4–10.5)
GFR calc Af Amer: >90 mL/min (ref >90)
Glucose, Bld: 134 mg/dL — ABNORMAL HIGH (ref 70–99)
Total Protein: 6.8 g/dL (ref 6.0–8.3)

## 2012-05-04 LAB — CBC WITH DIFFERENTIAL/PLATELET
Basophils Relative: 0 % (ref 0–1)
Eosinophils Absolute: 0.3 10*3/uL (ref 0.0–0.7)
Hemoglobin: 14.6 g/dL (ref 13.0–17.0)
MCH: 28.6 pg (ref 26.0–34.0)
MCHC: 34.8 g/dL (ref 30.0–36.0)
Monocytes Relative: 17 % — ABNORMAL HIGH (ref 3–12)
Neutrophils Relative %: 59 % (ref 43–77)
Platelets: 169 10*3/uL (ref 150–400)

## 2012-05-04 MED ORDER — SODIUM CHLORIDE 0.9 % IV SOLN: 75.0000 mg/kg | Freq: Three times a day (TID) | INTRAVENOUS | Status: DC

## 2012-05-04 MED ORDER — PIPERACILLIN-TAZOBACTAM 3.375 G IVPB
3.3750 g | Freq: Three times a day (TID) | INTRAVENOUS | Status: DC
  Administered 2012-05-04 – 2012-05-22 (×54): 3.375 g via INTRAVENOUS
  Filled 2012-05-04 (×58): qty 50

## 2012-05-04 NOTE — Progress Notes (Signed)
[facility] Progress Note  Subjective: Still having some abdominal pain but less than yesterday. Continues to be worse after eating. Having some nausea but no vomiting.  Objective: Vital signs in last 24 hours: Temp:  [97.2 F (36.2 C)-98.7 F (37.1 C)] 97.2 F (36.2 C) (09/15 0639) Pulse Rate:  [51-56] 56  (09/15 0639) Resp:  [16] 16  (09/15 0639) BP: (113-153)/(68-91) 148/68 mmHg (09/15 0639) SpO2:  [95 %-97 %] 95 % (09/15 0639) Weight:  [70.4 kg (155 lb 3.3 oz)] 70.4 kg (155 lb 3.3 oz) (09/15 0639) Weight change: -1.7 kg (-3 lb 12 oz)   PE: Abdomen soft moderately tender in epigastrium no guarding.  Lab Results: No results found for this or any previous visit (from the past 24 hour(s)).  Studies/Results: No results found.    Assessment: Probable gallstone pancreatitis with some question of ampullary neoplasm. Yesterday his labs came back with bilirubin rising to 5.9 with persistent significant lipase elevation. Today's labs are pending. WBC count from yesterday was normal. He is afebrile.  Plan: Await today's labs, suspect he will need ERCP probably tomorrow based on rising bilirubin. Try to explain the procedure to the patient he understands some English. I did discuss in a little more detail with his daughter probable need for invasive procedure, likely tomorrow. Her name is Mahetab [phone]. If  white blood cell count elevated today will cover with antibiotics. Will make n.p.o. after midnight.    Colin Castro C 05/04/2012, 8:22 AM

## 2012-05-04 NOTE — Progress Notes (Signed)
Subjective:  Patient without new complaints. He still has some abdominal pain when he eats. No chest pains or shortness of breath. No nausea vomiting. GI followup appreciated. Patient scheduled for ERCP tomorrow.   No Known Allergies Current Facility-Administered Medications  Medication Dose Route Frequency Provider Last Rate Last Dose  . dextrose 5 % and 0.45 % NaCl with KCl 20 mEq/L infusion   Intravenous Continuous [name], MD 75 mL/hr at 05/04/12 0902    . lisinopril (PRINIVIL,ZESTRIL) tablet 20 mg  20 mg Oral Daily [name], MD   20 mg at 05/04/12 0902  . ondansetron (ZOFRAN) tablet 4 mg  4 mg Oral Q6H PRN [name], MD       Or  . ondansetron (ZOFRAN) injection 4 mg  4 mg Intravenous Q6H PRN [name], MD   4 mg at 05/04/12 0349  . oxyCODONE (Oxy IR/ROXICODONE) immediate release tablet 5 mg  5 mg Oral Q4H PRN [name], MD   5 mg at 05/04/12 0349  . piperacillin-tazobactam (ZOSYN) IVPB 3.375 g  3.375 g Intravenous Q8H Barrie Folk, MD   3.375 g at 05/04/12 1036  . sodium chloride 0.9 % injection 3 mL  3 mL Intravenous Q12H [name], MD   3 mL at 05/01/12 2040  . zolpidem (AMBIEN) tablet 5 mg  5 mg Oral QHS PRN [name], MD      . DISCONTD: piperacillin-tazo (ZOSYN) NICU IV syringe 200 mg/mL  75 mg/kg Intravenous Q8H Barrie Folk, MD        Objective: Blood pressure 148/68, pulse 56, temperature 97.2 F (36.2 C), temperature source Oral, resp. rate 16, height 5\' 6"  (1.676 m), weight 155 lb 3.3 oz (70.4 kg), SpO2 95.00%.  Well-developed well-nourished [place] male in no acute distress. HEENT: No sinus tenderness. NECK: No posterior cervical nodes LUNGS: Clear to auscultation. CV: Normal S1, S2 without S3. ABD: Epigastric and left upper quadrant tenderness. MSK: Negative [place]. NEURO: Nonfocal.  Lab results: Results for orders placed during the hospital encounter of 05/01/12 (from the past 48 hour(s))  CBC     Status: Normal   Collection  Time   05/03/12  5:30 AM      Component Value Range Comment   WBC 8.0  4.0 - 10.5 K/uL    RBC 5.03  4.22 - 5.81 MIL/uL    Hemoglobin 14.3  13.0 - 17.0 g/dL    HCT 16.1  09.6 - 04.5 %    MCV 82.3  78.0 - 100.0 fL    MCH 28.4  26.0 - 34.0 pg    MCHC 34.5  30.0 - 36.0 g/dL    RDW 40.9  81.1 - 91.4 %    Platelets 168  150 - 400 K/uL   BASIC METABOLIC PANEL     Status: Abnormal   Collection Time   05/03/12  5:30 AM      Component Value Range Comment   Sodium 131 (*) 135 - 145 mEq/L    Potassium 3.4 (*) 3.5 - 5.1 mEq/L    Chloride 98  96 - 112 mEq/L    CO2 22  19 - 32 mEq/L    Glucose, Bld 87  70 - 99 mg/dL    BUN 12  6 - 23 mg/dL    Creatinine, Ser 7.82  0.50 - 1.35 mg/dL    Calcium 8.7  8.4 - 95.6 mg/dL    GFR calc non Af Amer 87 (*) >  90 mL/min    GFR calc Af Amer >90  >90 mL/min   LIPASE, BLOOD     Status: Abnormal   Collection Time   05/03/12  5:30 AM      Component Value Range Comment   Lipase 2216 (*) 11 - 59 U/L   HEPATIC FUNCTION PANEL     Status: Abnormal   Collection Time   05/03/12  5:30 AM      Component Value Range Comment   Total Protein 6.3  6.0 - 8.3 g/dL    Albumin 2.8 (*) 3.5 - 5.2 g/dL    AST 59 (*) 0 - 37 U/L    ALT 134 (*) 0 - 53 U/L    Alkaline Phosphatase 275 (*) 39 - 117 U/L    Total Bilirubin 5.9 (*) 0.3 - 1.2 mg/dL    Bilirubin, Direct 4.5 (*) 0.0 - 0.3 mg/dL    Indirect Bilirubin 1.4 (*) 0.3 - 0.9 mg/dL   LIPASE, BLOOD     Status: Abnormal   Collection Time   05/04/12  8:40 AM      Component Value Range Comment   Lipase 2023 (*) 11 - 59 U/L   COMPREHENSIVE METABOLIC PANEL     Status: Abnormal   Collection Time   05/04/12  8:40 AM      Component Value Range Comment   Sodium 129 (*) 135 - 145 mEq/L    Potassium 3.8  3.5 - 5.1 mEq/L    Chloride 97  96 - 112 mEq/L    CO2 20  19 - 32 mEq/L    Glucose, Bld 134 (*) 70 - 99 mg/dL    BUN 8  6 - 23 mg/dL    Creatinine, Ser 1.61  0.50 - 1.35 mg/dL    Calcium 8.9  8.4 - 09.6 mg/dL    Total Protein 6.8   6.0 - 8.3 g/dL    Albumin 2.9 (*) 3.5 - 5.2 g/dL    AST 63 (*) 0 - 37 U/L    ALT 135 (*) 0 - 53 U/L    Alkaline Phosphatase 279 (*) 39 - 117 U/L    Total Bilirubin 8.3 (*) 0.3 - 1.2 mg/dL    GFR calc non Af Amer >90  >90 mL/min    GFR calc Af Amer >90  >90 mL/min   CBC WITH DIFFERENTIAL     Status: Abnormal   Collection Time   05/04/12 11:00 AM      Component Value Range Comment   WBC 7.0  4.0 - 10.5 K/uL    RBC 5.11  4.22 - 5.81 MIL/uL    Hemoglobin 14.6  13.0 - 17.0 g/dL    HCT 04.5  40.9 - 81.1 %    MCV 82.0  78.0 - 100.0 fL    MCH 28.6  26.0 - 34.0 pg    MCHC 34.8  30.0 - 36.0 g/dL    RDW 91.4  78.2 - 95.6 %    Platelets 169  150 - 400 K/uL    Neutrophils Relative 59  43 - 77 %    Neutro Abs 4.1  1.7 - 7.7 K/uL    Lymphocytes Relative 20  12 - 46 %    Lymphs Abs 1.4  0.7 - 4.0 K/uL    Monocytes Relative 17 (*) 3 - 12 %    Monocytes Absolute 1.2 (*) 0.1 - 1.0 K/uL    Eosinophils Relative 4  0 - 5 %    Eosinophils  Absolute 0.3  0.0 - 0.7 K/uL    Basophils Relative 0  0 - 1 %    Basophils Absolute 0.0  0.0 - 0.1 K/uL     Studies/Results: No results found.  There is no problem list on file for this patient.   Impression: Pancreatitis. Rule out gallstone pancreatitis versus other. Abdominal pain secondary to above. Hypokalemia resolved. Mild malnutrition. Low albumin. Rule out inflammatory gammopathy. Patient with total protein/albumin split.    Plan: Continue present therapy. GI followup with evaluation as appropriate. Baseline SPEP.   August Saucer, Rock Sobol 05/04/2012 12:39 PM

## 2012-05-04 NOTE — Progress Notes (Signed)
CSW met with Pt.   Pt had only medication questions and referred Pt to CM.  CSW signing off.   Leron Croak, LCSWA [employer] [phone]

## 2012-05-05 ENCOUNTER — Inpatient Hospital Stay (HOSPITAL_COMMUNITY): Payer: Medicaid Other

## 2012-05-05 LAB — CBC WITH DIFFERENTIAL/PLATELET
Basophils Absolute: 0 10*3/uL (ref 0.0–0.1)
Basophils Relative: 0 % (ref 0–1)
Eosinophils Absolute: 0.3 10*3/uL (ref 0.0–0.7)
Lymphs Abs: 1.4 10*3/uL (ref 0.7–4.0)
MCH: 29 pg (ref 26.0–34.0)
Neutrophils Relative %: 59 % (ref 43–77)
Platelets: 150 10*3/uL (ref 150–400)
RBC: 5.2 MIL/uL (ref 4.22–5.81)
RDW: 14.7 % (ref 11.5–15.5)

## 2012-05-05 LAB — COMPREHENSIVE METABOLIC PANEL
ALT: 134 U/L — ABNORMAL HIGH (ref 0–53)
AST: 62 U/L — ABNORMAL HIGH (ref 0–37)
CO2: 18 mEq/L — ABNORMAL LOW (ref 19–32)
Chloride: 98 mEq/L (ref 96–112)
GFR calc non Af Amer: 89 mL/min — ABNORMAL LOW (ref >90)
Potassium: 3.8 mEq/L (ref 3.5–5.1)
Sodium: 131 mEq/L — ABNORMAL LOW (ref 135–145)
Total Bilirubin: 9.5 mg/dL — ABNORMAL HIGH (ref 0.3–1.2)

## 2012-05-05 LAB — PROTIME-INR: Prothrombin Time: 13.2 seconds (ref 11.6–15.2)

## 2012-05-05 LAB — LIPASE, BLOOD: Lipase: 1311 U/L — ABNORMAL HIGH (ref 11–59)

## 2012-05-05 MED ORDER — HEPARIN SODIUM (PORCINE) 5000 UNIT/ML IJ SOLN
5000.0000 [IU] | Freq: Three times a day (TID) | INTRAMUSCULAR | Status: DC
  Administered 2012-05-05 – 2012-05-06 (×2): 5000 [IU] via SUBCUTANEOUS
  Filled 2012-05-05 (×6): qty 1

## 2012-05-05 MED ORDER — GADOBENATE DIMEGLUMINE 529 MG/ML IV SOLN
14.0000 mL | Freq: Once | INTRAVENOUS | Status: AC | PRN
  Administered 2012-05-05: 14 mL via INTRAVENOUS

## 2012-05-05 NOTE — Progress Notes (Signed)
Subjective:  No chest pain. Abdominal discomfort with food intake. Improving blood work yesterday.  Objective:  Vital Signs in the last 24 hours: Temp:  [97.9 F (36.6 C)-98.7 F (37.1 C)] 98.7 F (37.1 C) (09/16 0646) Pulse Rate:  [55-62] 58  (09/16 0646) Cardiac Rhythm:  [-]  Resp:  [16-20] 20  (09/16 0646) BP: (129-151)/(68-77) 151/77 mmHg (09/16 0646) SpO2:  [95 %-98 %] 97 % (09/16 0646) Weight:  [70.4 kg (155 lb 3.3 oz)] 70.4 kg (155 lb 3.3 oz) (09/16 0646)  Physical Exam: BP Readings from Last 1 Encounters:  05/05/12 151/77    Wt Readings from Last 1 Encounters:  05/05/12 70.4 kg (155 lb 3.3 oz)    Weight change: 0 kg (0 lb)  HEENT: [place]/AT, Eyes-Brown, PERL, EOMI, Conjunctiva-Pink, Sclera-Non-icteric Neck: No JVD, No bruit, Trachea midline. Lungs:  Clear, Bilateral. Cardiac:  Regular rhythm, normal S1 and S2, no S3.  Abdomen:  Soft, epigastric tenderness. Extremities:  No edema present. No cyanosis. No clubbing. CNS: AxOx3, Cranial nerves grossly intact, moves all 4 extremities. Right handed. Skin: Warm and dry.   Intake/Output from previous day: 09/15 0701 - 09/16 0700 In: 4062.5 [I.V.:3912.5; IV Piggyback:150] Out: -     Lab Results: BMET    Component Value Date/Time   NA 131* 05/05/2012 0543   K 3.8 05/05/2012 0543   CL 98 05/05/2012 0543   CO2 18* 05/05/2012 0543   GLUCOSE 89 05/05/2012 0543   BUN 8 05/05/2012 0543   CREATININE 0.86 05/05/2012 0543   CALCIUM 8.8 05/05/2012 0543   GFRNONAA 89* 05/05/2012 0543   GFRAA >90 05/05/2012 0543   CBC    Component Value Date/Time   WBC 6.2 05/05/2012 0543   RBC 5.20 05/05/2012 0543   HGB 15.1 05/05/2012 0543   HCT 42.5 05/05/2012 0543   PLT 150 05/05/2012 0543   MCV 81.7 05/05/2012 0543   MCH 29.0 05/05/2012 0543   MCHC 35.5 05/05/2012 0543   RDW 14.7 05/05/2012 0543   LYMPHSABS 1.4 05/05/2012 0543   MONOABS 0.9 05/05/2012 0543   EOSABS 0.3 05/05/2012 0543   BASOSABS 0.0 05/05/2012 0543   CARDIAC ENZYMES Lab Results    Component Value Date   TROPONINI <0.30 05/01/2012    Assessment/Plan:  Patient Active Hospital Problem List: Acute Pancreatitis probably due to GB stone  R/O Ampullary stricture  Diverticulosis  Right inguinal hernia  Awaiting ERCP today.   LOS: 4 days    Orpah Cobb  MD  05/05/2012, 9:03 AM

## 2012-05-05 NOTE — Progress Notes (Signed)
[facility] Progress Note  Subjective: The patient was seen this morning, and he is not having much upper abdominal pain now. His lipase although down is still markedly elevated. I talked to him about ERCP, but he doesn't seem to understand about it other than he says it's putting a camera down in his stomach. He wanted me to talked to his daughter who lives in [place] which I will. I told him that I thought we needed to get an interpreter who spoke his language so that he could be fully informed of the procedure along with the potential risks of bleeding, infection, perforation, and worsening pancreatitis before doing it. I would like to see his lipase come down more before doing the ERCP. I would also like to get an MRCP done today to further clarify what might be going on. This would be a noninvasive approach to image the biliary tree without risks of the ERCP.  Objective: Vital signs in last 24 hours: Temp:  [97.9 F (36.6 C)-98.7 F (37.1 C)] 98.7 F (37.1 C) (09/16 0646) Pulse Rate:  [55-62] 58  (09/16 0646) Resp:  [16-20] 20  (09/16 0646) BP: (129-151)/(68-77) 151/77 mmHg (09/16 0646) SpO2:  [95 %-98 %] 97 % (09/16 0646) Weight:  [70.4 kg (155 lb 3.3 oz)] 70.4 kg (155 lb 3.3 oz) (09/16 0646) Weight change: 0 kg (0 lb)   PE: He is in no acute distress  Abdomen soft nontender Lab Results: Results for orders placed during the hospital encounter of 05/01/12 (from the past 24 hour(s))  CBC WITH DIFFERENTIAL     Status: Abnormal   Collection Time   05/04/12 11:00 AM      Component Value Range   WBC 7.0  4.0 - 10.5 K/uL   RBC 5.11  4.22 - 5.81 MIL/uL   Hemoglobin 14.6  13.0 - 17.0 g/dL   HCT 16.1  09.6 - 04.5 %   MCV 82.0  78.0 - 100.0 fL   MCH 28.6  26.0 - 34.0 pg   MCHC 34.8  30.0 - 36.0 g/dL   RDW 40.9  81.1 - 91.4 %   Platelets 169  150 - 400 K/uL   Neutrophils Relative 59  43 - 77 %   Neutro Abs 4.1  1.7 - 7.7 K/uL   Lymphocytes Relative 20  12 - 46 %   Lymphs Abs 1.4  0.7 - 4.0 K/uL   Monocytes Relative 17 (*) 3 - 12 %   Monocytes Absolute 1.2 (*) 0.1 - 1.0 K/uL   Eosinophils Relative 4  0 - 5 %   Eosinophils Absolute 0.3  0.0 - 0.7 K/uL   Basophils Relative 0  0 - 1 %   Basophils Absolute 0.0  0.0 - 0.1 K/uL  COMPREHENSIVE METABOLIC PANEL     Status: Abnormal   Collection Time   05/05/12  5:43 AM      Component Value Range   Sodium 131 (*) 135 - 145 mEq/L   Potassium 3.8  3.5 - 5.1 mEq/L   Chloride 98  96 - 112 mEq/L   CO2 18 (*) 19 - 32 mEq/L   Glucose, Bld 89  70 - 99 mg/dL   BUN 8  6 - 23 mg/dL   Creatinine, Ser 7.82  0.50 - 1.35 mg/dL   Calcium 8.8  8.4 - 95.6 mg/dL   Total Protein 6.7  6.0 - 8.3 g/dL   Albumin 2.9 (*) 3.5 - 5.2 g/dL   AST 62 (*)  0 - 37 U/L   ALT 134 (*) 0 - 53 U/L   Alkaline Phosphatase 286 (*) 39 - 117 U/L   Total Bilirubin 9.5 (*) 0.3 - 1.2 mg/dL   GFR calc non Af Amer 89 (*) >90 mL/min   GFR calc Af Amer >90  >90 mL/min  CBC WITH DIFFERENTIAL     Status: Abnormal   Collection Time   05/05/12  5:43 AM      Component Value Range   WBC 6.2  4.0 - 10.5 K/uL   RBC 5.20  4.22 - 5.81 MIL/uL   Hemoglobin 15.1  13.0 - 17.0 g/dL   HCT 13.0  86.5 - 78.4 %   MCV 81.7  78.0 - 100.0 fL   MCH 29.0  26.0 - 34.0 pg   MCHC 35.5  30.0 - 36.0 g/dL   RDW 69.6  29.5 - 28.4 %   Platelets 150  150 - 400 K/uL   Neutrophils Relative 59  43 - 77 %   Neutro Abs 3.7  1.7 - 7.7 K/uL   Lymphocytes Relative 22  12 - 46 %   Lymphs Abs 1.4  0.7 - 4.0 K/uL   Monocytes Relative 14 (*) 3 - 12 %   Monocytes Absolute 0.9  0.1 - 1.0 K/uL   Eosinophils Relative 5  0 - 5 %   Eosinophils Absolute 0.3  0.0 - 0.7 K/uL   Basophils Relative 0  0 - 1 %   Basophils Absolute 0.0  0.0 - 0.1 K/uL  LIPASE, BLOOD     Status: Abnormal   Collection Time   05/05/12  5:43 AM      Component Value Range   Lipase 1311 (*) 11 - 59 U/L    Studies/Results: @RISRSLT24 @    Assessment: Pancreatitis, this could be a gallstone  pancreatitis.  Plan: Obtain MRCP  Discuss with patient in detail with an interpreter so that he understands what this procedure is and its potential risks.  Discuss with daughter also.    Angeliah Wisdom F 05/05/2012, 9:20 AM

## 2012-05-05 NOTE — Progress Notes (Signed)
Patient states that his cardiac monitor was discontinued on Saturday 05/03/2012

## 2012-05-06 MED ORDER — SODIUM CHLORIDE 0.9 % IV SOLN: INTRAVENOUS | Status: DC

## 2012-05-06 MED ORDER — SODIUM CHLORIDE 0.9 % IV SOLN
INTRAVENOUS | Status: DC
  Administered 2012-05-07: 500 mL via INTRAVENOUS

## 2012-05-06 MED ORDER — DIPHENHYDRAMINE HCL 50 MG/ML IJ SOLN
25.0000 mg | Freq: Four times a day (QID) | INTRAMUSCULAR | Status: DC | PRN
  Administered 2012-05-06 – 2012-05-12 (×13): 25 mg via INTRAVENOUS
  Filled 2012-05-06 (×11): qty 1

## 2012-05-06 MED ORDER — DIPHENHYDRAMINE HCL 50 MG/ML IJ SOLN
INTRAMUSCULAR | Status: AC
  Administered 2012-05-06: 25 mg via INTRAVENOUS
  Filled 2012-05-06: qty 1

## 2012-05-06 NOTE — Progress Notes (Signed)
Subjective:  Feeling better. No abdominal pain today. MR abdomen worry some for possible neoplasm along with GB stone.  Objective:  Vital Signs in the last 24 hours: Temp:  [98 F (36.7 C)-98.2 F (36.8 C)] 98.2 F (36.8 C) (09/17 0551) Pulse Rate:  [55-58] 55  (09/17 0551) Cardiac Rhythm:  [-] Normal sinus rhythm (09/16 1030) Resp:  [18] 18  (09/17 0551) BP: (118-137)/(55-66) 119/55 mmHg (09/17 0551) SpO2:  [97 %-98 %] 98 % (09/17 0551) Weight:  [68.2 kg (150 lb 5.7 oz)] 68.2 kg (150 lb 5.7 oz) (09/17 0551)  Physical Exam: BP Readings from Last 1 Encounters:  05/06/12 119/55    Wt Readings from Last 1 Encounters:  05/06/12 68.2 kg (150 lb 5.7 oz)    Weight change: -2.2 kg (-4 lb 13.6 oz)  HEENT: [place]/AT, Eyes-Brown, PERL, EOMI, Conjunctiva-Pink, Sclera-Non-icteric Neck: No JVD, No bruit, Trachea midline. Lungs:  Clear, Bilateral. Cardiac:  Regular rhythm, normal S1 and S2, no S3.  Abdomen:  Soft, non-tender. Extremities:  No edema present. No cyanosis. No clubbing. CNS: AxOx3, Cranial nerves grossly intact, moves all 4 extremities. Right handed. Skin: Warm and dry.   Intake/Output from previous day:      Lab Results: BMET    Component Value Date/Time   NA 131* 05/05/2012 0543   K 3.8 05/05/2012 0543   CL 98 05/05/2012 0543   CO2 18* 05/05/2012 0543   GLUCOSE 89 05/05/2012 0543   BUN 8 05/05/2012 0543   CREATININE 0.86 05/05/2012 0543   CALCIUM 8.8 05/05/2012 0543   GFRNONAA 89* 05/05/2012 0543   GFRAA >90 05/05/2012 0543   CBC    Component Value Date/Time   WBC 6.2 05/05/2012 0543   RBC 5.20 05/05/2012 0543   HGB 15.1 05/05/2012 0543   HCT 42.5 05/05/2012 0543   PLT 150 05/05/2012 0543   MCV 81.7 05/05/2012 0543   MCH 29.0 05/05/2012 0543   MCHC 35.5 05/05/2012 0543   RDW 14.7 05/05/2012 0543   LYMPHSABS 1.4 05/05/2012 0543   MONOABS 0.9 05/05/2012 0543   EOSABS 0.3 05/05/2012 0543   BASOSABS 0.0 05/05/2012 0543   CARDIAC ENZYMES Lab Results  Component Value Date   TROPONINI <0.30 05/01/2012    Assessment/Plan:  Patient Active Hospital Problem List: Acute Pancreatitis probably due to GB stone  R/O Ampullary stricture/Neoplasm  Diverticulosis  Right inguinal hernia  Follow with GI.   LOS: 5 days    Orpah Cobb  MD  05/06/2012, 8:27 AM

## 2012-05-06 NOTE — Progress Notes (Signed)
The MRCP was reviewed and it is worrisome for a mass in the area of the ampulla of Vater. This could certainly be causing obstruction in this region. There was no sign of choledocholithiasis. I discussed this with Dr. Dulce Sellar to review the MRCP and did not feel that an EUS would be the next step as suggested on the MRCP. ERCP will therefore be planned with visualization of the ampullary area, possible biopsy, and possible biliary stent placement. I was able to speak to the patient in the presence of Dr. Algie Coffer who speaks the patient's language and was able to interpret. The procedure was explained to the patient along with the reasons and the potential risks of bleeding, infection, perforation, and pancreatitis. We will set him up for ERCP, possible biopsy, possible sphincterotomy, possible stent placement.

## 2012-05-07 ENCOUNTER — Inpatient Hospital Stay (HOSPITAL_COMMUNITY): Payer: Medicaid Other

## 2012-05-07 ENCOUNTER — Encounter (HOSPITAL_COMMUNITY)
Admission: EM | Disposition: A | Discharge status: Home Health | Payer: Self-pay | Source: Home / Self Care | Attending: Cardiovascular Disease

## 2012-05-07 ENCOUNTER — Encounter (HOSPITAL_COMMUNITY): Payer: Self-pay | Admitting: Radiology

## 2012-05-07 HISTORY — PX: ERCP: SHX5425

## 2012-05-07 HISTORY — PX: EUS: SHX5427

## 2012-05-07 SURGERY — ERCP, WITH INTERVENTION IF INDICATED
Anesthesia: Moderate Sedation

## 2012-05-07 MED ORDER — DIPHENHYDRAMINE HCL 50 MG/ML IJ SOLN
INTRAMUSCULAR | Status: AC
  Filled 2012-05-07: qty 1

## 2012-05-07 MED ORDER — FENTANYL CITRATE 0.05 MG/ML IJ SOLN
INTRAMUSCULAR | Status: AC
  Filled 2012-05-07: qty 4

## 2012-05-07 MED ORDER — GLUCAGON HCL (RDNA) 1 MG IJ SOLR
INTRAMUSCULAR | Status: AC
  Filled 2012-05-07: qty 2

## 2012-05-07 MED ORDER — FENTANYL CITRATE 0.05 MG/ML IJ SOLN
INTRAMUSCULAR | Status: AC
  Filled 2012-05-07: qty 2

## 2012-05-07 MED ORDER — MIDAZOLAM HCL 2 MG/2ML IJ SOLN
INTRAMUSCULAR | Status: AC
  Filled 2012-05-07: qty 4

## 2012-05-07 MED ORDER — FENTANYL CITRATE 0.05 MG/ML IJ SOLN
INTRAMUSCULAR | Status: DC | PRN
  Administered 2012-05-07 (×3): 25 ug via INTRAVENOUS

## 2012-05-07 MED ORDER — MIDAZOLAM HCL 10 MG/2ML IJ SOLN
INTRAMUSCULAR | Status: DC | PRN
  Administered 2012-05-07 (×3): 2 mg via INTRAVENOUS

## 2012-05-07 MED ORDER — BUTAMBEN-TETRACAINE-BENZOCAINE 2-2-14 % EX AERO
INHALATION_SPRAY | CUTANEOUS | Status: DC | PRN
  Administered 2012-05-07: 2 via TOPICAL

## 2012-05-07 MED ORDER — MIDAZOLAM HCL 10 MG/2ML IJ SOLN
INTRAMUSCULAR | Status: AC
  Filled 2012-05-07: qty 4

## 2012-05-07 MED ORDER — MIDAZOLAM HCL 5 MG/5ML IJ SOLN
INTRAMUSCULAR | Status: AC | PRN
  Administered 2012-05-07: 2 mg via INTRAVENOUS
  Administered 2012-05-07: 1 mg via INTRAVENOUS
  Administered 2012-05-07: 2 mg via INTRAVENOUS
  Administered 2012-05-07 (×5): 1 mg via INTRAVENOUS

## 2012-05-07 MED ORDER — FENTANYL CITRATE 0.05 MG/ML IJ SOLN
INTRAMUSCULAR | Status: AC
  Filled 2012-05-07: qty 6

## 2012-05-07 MED ORDER — MIDAZOLAM HCL 2 MG/2ML IJ SOLN
INTRAMUSCULAR | Status: AC
  Filled 2012-05-07: qty 6

## 2012-05-07 MED ORDER — HYDROMORPHONE HCL PF 1 MG/ML IJ SOLN
INTRAMUSCULAR | Status: AC | PRN
  Administered 2012-05-07 (×5): 1 mg via INTRAVENOUS

## 2012-05-07 MED ORDER — DIPHENHYDRAMINE HCL 50 MG/ML IJ SOLN
INTRAMUSCULAR | Status: DC | PRN
  Administered 2012-05-07: 25 mg via INTRAVENOUS

## 2012-05-07 MED ORDER — HYDROMORPHONE HCL PF 2 MG/ML IJ SOLN
INTRAMUSCULAR | Status: AC
  Filled 2012-05-07: qty 1

## 2012-05-07 MED ORDER — HYDROMORPHONE HCL PF 2 MG/ML IJ SOLN
INTRAMUSCULAR | Status: AC
  Filled 2012-05-07: qty 2

## 2012-05-07 MED ORDER — FENTANYL CITRATE 0.05 MG/ML IJ SOLN
INTRAMUSCULAR | Status: AC | PRN
  Administered 2012-05-07 (×2): 100 ug via INTRAVENOUS
  Administered 2012-05-07 (×2): 50 ug via INTRAVENOUS

## 2012-05-07 MED ORDER — FLUMAZENIL 0.5 MG/5ML IV SOLN
0.5000 mg | Freq: Once | INTRAVENOUS | Status: AC
  Administered 2012-05-07: 0.5 mg via INTRAVENOUS
  Filled 2012-05-07: qty 5

## 2012-05-07 MED ORDER — DIPHENHYDRAMINE-ZINC ACETATE 2-0.1 % EX CREA
TOPICAL_CREAM | Freq: Every day | CUTANEOUS | Status: DC | PRN
  Administered 2012-05-08: 1 via TOPICAL
  Administered 2012-05-10: 13:00:00 via TOPICAL
  Filled 2012-05-07 (×4): qty 28

## 2012-05-07 MED ORDER — IOHEXOL 300 MG/ML  SOLN
100.0000 mL | Freq: Once | INTRAMUSCULAR | Status: AC | PRN
  Administered 2012-05-07: 50 mL

## 2012-05-07 NOTE — Op Note (Signed)
[facility] [address] [place], [address]   ERCP PROCEDURE REPORT  PATIENT: Colin Castro, Colin Castro  MR# :[id number] BIRTHDATE: August 13, 1946  GENDER: Male ENDOSCOPIST: Wandalee Ferdinand, MD REFERRED BY: PROCEDURE DATE:  05/07/2012 PROCEDURE:   EGD ,initial plan was for an ERCP with biliary decompression ASA CLASS:   3 INDICATIONS:obstructive jaundice with abnormal MRI showing dilation of both CBD and pancreatic duct with duodenal thickening worrisome for malignancy MEDICATIONS: fentanyl 50 mcg IV, Versed 4 mg IV, Benadryl 25 mg IV TOPICAL ANESTHETIC: Cetacaine spray  DESCRIPTION OF PROCEDURE:   After the risks benefits and alternatives of the procedure were thoroughly explained, informed consent was obtained.  The [employer]ax ERCP [id number]  endoscope was introduced through the mouth  and advanced to the duodenal bulb. At this point there was an obvious stricture in the post bulbar area with friable mucosa.  I could not advance the scope beyond the stricture. I therefore removed the lateral viewing scope and advanced a forward viewing scope which was of smaller diameter but still could not get it past the post bulbar duodenal stricture. This area has the appearance of tumor with friable mucosa but there is also appearance of extrinsic compression. No obvious luminal tumor could be identified but the mucosa was friable and I did obtain some biopsies from the strictured area. After this I discussed with my partner Dr. Dulce Sellar who was here who does endoscopic ultrasound and it was felt that perhaps an EUS could give [place] further information. I then discussed going ahead and doing EUS with a family who consented to have this done also at the same time. No other specific abnormalities were seen in the stomach or in the esophagus.      .  findings: See above        The scope was then completely withdrawn from the patient and the procedure  terminated.     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 Biopsies were obtained. EUS was done. see above RECOMMENDATIONS:obtain CEA level, CA 19-9 level, consult interventional radiology for percutaneous transhepatic cholangiogram with biliary drainage. Check duodenal biopsies.     _______________________________ Rosalie DoctorWandalee Ferdinand, MD 05/07/2012 10:33 AM   CC:

## 2012-05-07 NOTE — Progress Notes (Signed)
Subjective:  Sleepy x 1 hour post return from IR intervention of 8 Fr biliary drain for CBD obstruction at ampulla and failure of ERCP due to post bulbar duodenal stricture, with suspicion of tumor.  Objective:  Vital Signs in the last 24 hours: Temp:  [97.4 F (36.3 C)-98.5 F (36.9 C)] 97.9 F (36.6 C) (09/18 2008) Pulse Rate:  [55-106] 92  (09/18 2008) Cardiac Rhythm:  [-] Normal sinus rhythm (09/18 1738) Resp:  [11-31] 17  (09/18 2008) BP: (72-197)/(36-106) 121/94 mmHg (09/18 2008) SpO2:  [21 %-100 %] 100 % (09/18 2008)  Physical Exam: BP Readings from Last 1 Encounters:  05/07/12 121/94    Wt Readings from Last 1 Encounters:  05/06/12 68.2 kg (150 lb 5.7 oz)    Weight change:   HEENT: [place]/AT, Eyes-Brown, PERL, EOMI, Conjunctiva-Pink, Sclera-icteric Neck: No JVD, No bruit, Trachea midline. Lungs:  Clear, Bilateral. Cardiac:  Regular rhythm, normal S1 and S2, no S3.  Abdomen:  Soft, non-tender. Extremities:  No edema present. No cyanosis. No clubbing. CNS: AxOx0, Cranial nerves grossly intact. Skin: Warm and dry.   Intake/Output from previous day: 09/17 0701 - 09/18 0700 In: 4075 [P.O.:440; I.V.:3435; IV Piggyback:200] Out: -     Lab Results: BMET    Component Value Date/Time   NA 131* 05/05/2012 0543   K 3.8 05/05/2012 0543   CL 98 05/05/2012 0543   CO2 18* 05/05/2012 0543   GLUCOSE 89 05/05/2012 0543   BUN 8 05/05/2012 0543   CREATININE 0.86 05/05/2012 0543   CALCIUM 8.8 05/05/2012 0543   GFRNONAA 89* 05/05/2012 0543   GFRAA >90 05/05/2012 0543   CBC    Component Value Date/Time   WBC 6.2 05/05/2012 0543   RBC 5.20 05/05/2012 0543   HGB 15.1 05/05/2012 0543   HCT 42.5 05/05/2012 0543   PLT 150 05/05/2012 0543   MCV 81.7 05/05/2012 0543   MCH 29.0 05/05/2012 0543   MCHC 35.5 05/05/2012 0543   RDW 14.7 05/05/2012 0543   LYMPHSABS 1.4 05/05/2012 0543   MONOABS 0.9 05/05/2012 0543   EOSABS 0.3 05/05/2012 0543   BASOSABS 0.0 05/05/2012 0543   CARDIAC ENZYMES Lab Results   Component Value Date   TROPONINI <0.30 05/01/2012    Assessment/Plan:  Patient Active Hospital Problem List: Acute Pancreatitis probably due to GB stone  R/O Ampullary stricture/Neoplasm  Diverticulosis  Right inguinal hernia Duodenal stricture CBD biliary drain Obstructive jaundice  Increase IV fluids Discussed care with wife and daughter.   LOS: 6 days    Orpah Cobb  MD  05/07/2012, 8:09 PM

## 2012-05-07 NOTE — Progress Notes (Signed)
We receive him from Interventional radiology at this time.  Pt. Is very lethargic and moans continuously.  His right lower abd./thoracic drsng. Is dry, with his abd. Drain putting out very dark green liquid drng., which is attached to gravity drain bag.  He is breathing normally, and his skin is jaundiced, warm and dry.  He is too lethargic at this time to consider giving any pain med.

## 2012-05-07 NOTE — H&P (Signed)
Chief Complaint: Biliary obstruction Referring Physician:Ganem HPI: Colin Castro is an 66 y.o. male who presented with abdominal tightness and nausea. He has also had progressive jaundice and his workup now finds evidence of possible mass at the ampulla causing biliary obstruction with rising bilirubin. He underwent Endoscopy today with unsuccessful attempts at ERCP or EUS. Biopsies were taken of the duodenum. IR consult requested for PTC to allow decompression of biliary system. Pt surrently feels ok, no N/V. Tol endoscopy with sedation well earlier today.  Past Medical History:  Past Medical History  Diagnosis Date  . Hypertension   . High cholesterol     Past Surgical History: History reviewed. No pertinent past surgical history.  Family History: History reviewed. No pertinent family history.  Social History:  reports that he has been smoking Cigarettes.  He has a 12 pack-year smoking history. He has never used smokeless tobacco. He reports that he does not drink alcohol or use illicit drugs.  Allergies: No Known Allergies  Medications: Medications Prior to Admission  Medication Sig Dispense Refill  . atorvastatin (LIPITOR) 10 MG tablet Take 10 mg by mouth daily.      [name] lisinopril (PRINIVIL,ZESTRIL) 20 MG tablet Take 20 mg by mouth daily.      . pravastatin (PRAVACHOL) 40 MG tablet Take 40 mg by mouth daily.      . predniSONE (DELTASONE) 10 MG tablet Take 10-20 mg by mouth daily. Taper dosing     Started at 20mg  for first four days then 10mg  for rest of days.     Was taking for itching.        Please HPI for pertinent positives, otherwise complete 10 system ROS negative.  Physical Exam: Blood pressure 137/81, pulse 60, temperature 97.6 F (36.4 C), temperature source Oral, resp. rate 18, height 5\' 6"  (1.676 m), weight 150 lb 5.7 oz (68.2 kg), SpO2 99.00%. Body mass index is 24.27 kg/(m^2).   General Appearance:  Alert, cooperative, no distress, appears stated age.  Head:   Normocephalic, without obvious abnormality, atraumatic  EENT: Icteric sclera, otherwise unremarkable  Neck: Supple, symmetrical, trachea midline, no adenopathy, thyroid: not enlarged, symmetric, no tenderness/mass/nodules  Lungs:   Clear to auscultation bilaterally, no w/r/r, respirations unlabored without use of accessory muscles.  Chest Wall:  No tenderness or deformity  Heart:  Regular rate and rhythm, S1, S2 normal, no murmur, rub or gallop. Carotids 2+ without bruit.  Abdomen:   Soft, non-tender, non distended. Bowel sounds active all four quadrants,  no masses, no organomegaly.  Extremities: Extremities normal, atraumatic, no cyanosis or edema  Skin: Jaundiced, no rashes  Neurologic: Normal affect, no gross deficits.   No results found for this or any previous visit (from the past 48 hour(s)). Mr 3d Recon At Scanner  05/05/2012  *RADIOLOGY REPORT*  Clinical Data:  Pancreatitis, evaluate for CBD stone  MRI ABDOMEN WITHOUT AND WITH CONTRAST (INCLUDING MRCP)  Technique:  Multiplanar multisequence MR imaging of the abdomen was performed both before and after the administration of intravenous contrast. Heavily T2-weighted images of the biliary and pancreatic ducts were obtained, and three-dimensional MRCP images were rendered by post processing.  Contrast: 14mL MULTIHANCE GADOBENATE DIMEGLUMINE 529 MG/ML IV SOLN  Comparison:  CT abdomen pelvis dated 05/01/2012  Findings:  Motion degraded images.  Mild intrahepatic ductal dilatation.  Common duct measures up to 14 mm and tapers abruptly at the ampulla.  Mild pancreatic ductal dilatation measuring up to 7 mm and tapering abruptly at the ampulla.  Cholelithiasis.  No choledocholithiasis is seen.  Narrowing/wall thickening involving the second portion of the duodenum (series 3/images 25 and 27) and extending to the ampulla (series 3/image 30).  This appearance is worrisome for neoplasm (series 11/image 21), less likely focal inflammatory changes related to  known pancreatitis.  It is unclear whether the abnormality involves the adjacent pancreatic head/uncinate process.  The celiac artery, SMA/SMV, and portal vein all appear uninvolved.  No abdominal ascites.  No suspicious abdominal lymphadenopathy.  No focal osseous lesions.  IMPRESSION: Intrahepatic/extrahepatic and pancreatic ductal dilatation, as described above, tapering abruptly at the ampulla.  Focal wall thickening involving the second portion of the duodenum and extending to the ampulla, as described above, worrisome for neoplasm.  Cholelithiasis.  No choledocholithiasis is seen.  EUS is suggested for further evaluation.  These results will be called to the ordering clinician or representative by the Radiologist Assistant, and communication documented in the PACS Dashboard.   Original Report Authenticated By: Charline Bills, M.D.    Mr Abd W/wo Cm/mrcp  05/05/2012  *RADIOLOGY REPORT*  Clinical Data:  Pancreatitis, evaluate for CBD stone  MRI ABDOMEN WITHOUT AND WITH CONTRAST (INCLUDING MRCP)  Technique:  Multiplanar multisequence MR imaging of the abdomen was performed both before and after the administration of intravenous contrast. Heavily T2-weighted images of the biliary and pancreatic ducts were obtained, and three-dimensional MRCP images were rendered by post processing.  Contrast: 14mL MULTIHANCE GADOBENATE DIMEGLUMINE 529 MG/ML IV SOLN  Comparison:  CT abdomen pelvis dated 05/01/2012  Findings:  Motion degraded images.  Mild intrahepatic ductal dilatation.  Common duct measures up to 14 mm and tapers abruptly at the ampulla.  Mild pancreatic ductal dilatation measuring up to 7 mm and tapering abruptly at the ampulla.  Cholelithiasis.  No choledocholithiasis is seen.  Narrowing/wall thickening involving the second portion of the duodenum (series 3/images 25 and 27) and extending to the ampulla (series 3/image 30).  This appearance is worrisome for neoplasm (series 11/image 21), less likely focal  inflammatory changes related to known pancreatitis.  It is unclear whether the abnormality involves the adjacent pancreatic head/uncinate process.  The celiac artery, SMA/SMV, and portal vein all appear uninvolved.  No abdominal ascites.  No suspicious abdominal lymphadenopathy.  No focal osseous lesions.  IMPRESSION: Intrahepatic/extrahepatic and pancreatic ductal dilatation, as described above, tapering abruptly at the ampulla.  Focal wall thickening involving the second portion of the duodenum and extending to the ampulla, as described above, worrisome for neoplasm.  Cholelithiasis.  No choledocholithiasis is seen.  EUS is suggested for further evaluation.  These results will be called to the ordering clinician or representative by the Radiologist Assistant, and communication documented in the PACS Dashboard.   Original Report Authenticated By: Charline Bills, M.D.     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 Biopsies pending post endoscopy Steadily rising bilirubin and jaundice Discussed with pt and family indications for percutaneous transhepatic cholangiogram and drain placement. Procedure discussed in detail, including risks and complications. Discussed that drain would remain for the foreseeable future until definitive diagnosis and treatment is made. Labs ok Consent signed by daughter as pt had sedation earlier today.  Brayton El PA-C 05/07/2012, 12:27 PM

## 2012-05-07 NOTE — Progress Notes (Signed)
Dr. Algie Coffer called and notified of pt's manual BP of 78/50, P-84,R-12, T-98.3, and  O2 sats 97% on 2L. Order given to check CBG which was 112. Dr. Algie Coffer stated he would call back for further orders.

## 2012-05-07 NOTE — Op Note (Signed)
[facility] [address] [place], [address]   ENDOSCOPIC ULTRASOUND PROCEDURE REPORT  PATIENT: Colin Castro, Colin Castro  MR#: [id number] BIRTHDATE: Nov 21, 1945  GENDER: Male ENDOSCOPIST: Willis Modena, MD REFERRED BY:  Orpah Cobb, M.D. PROCEDURE DATE:  05/07/2012 PROCEDURE:   Upper EUS ASA CLASS:      Class II INDICATIONS:   1.  pancreatitis, obstructive jaundice. MEDICATIONS: Fentanyl 25 mcg IV and Versed 2 mg IV  DESCRIPTION OF PROCEDURE:   After the risks benefits and alternatives of the procedure were  explained, informed consent was obtained. The patient was then placed in the left, lateral, decubitus postion and IV sedation was administered. Throughout the procedure, the patients blood pressure, pulse and oxygen saturations were monitored continuously.  Under direct visualization, the     endoscope was introduced through the mouth and advanced to the bulb of duodenum .  Water was used as necessary to provide an acoustic interface.  Upon completion of the imaging, water was removed and the patient was sent to the recovery room in satisfactory condition.   FINDINGS:      Linear EUS scope used.  Diffuse honeycombing lobularity of pancreas; most typical of chronic pancreatitis, but can be seen with acute pancreatitis or progressive obstruction of pancreatic ductal obstruction.  Gallbladder distended and full of sludge.  Post-bulbar friable and edematous region with stricturing noted on ERCP seen again; can't pass the EUS scope through this area.  Head of pancreas showed no obvious mass.  Bile and pancreatic ducts both were dilated and I could follow them all the way to the ampulla.  Unable to visualize the uncinate pancreas . The ampulla and post-bulbar duodenal wall were suboptimally seen as well.  IMPRESSION:     As above.  Suspect patient has either infiltrative or inflammatory process around the ampulla, descending duodenal wall or uncinate  pancreas.  RECOMMENDATIONS:     1.  Watch for potential complications of procedure. 2.  Patient needs Interventional Radiology consultation for percutaneous biliary decompression. 3.  Would consider UGI series in near future to better assess his duodenal stricture. 4.  Regardless of underlying process, if duodenal obstruction persists, patient will likely need surgical consultation.   _______________________________ Rosalie DoctorWillis Modena, MD 05/07/2012 10:26 AM   CC:

## 2012-05-07 NOTE — Progress Notes (Signed)
Pt given Romazicon 0.5mg  IV with Dr. Algie Coffer at bedside. Pt became alert after administration. Wife and daughter also at bedside with the patient. BP 89/48 and O2 sats 99% on 2L. Will monitor VS  q45minx3 per Dr. Algie Coffer.

## 2012-05-07 NOTE — Procedures (Signed)
Successful placement of an 8 [place] biliary drain through a right posterior biliary duct with tip coiled in the distal CBD. Drain placed to bag. No immediate post procedural complications.

## 2012-05-08 ENCOUNTER — Encounter (HOSPITAL_COMMUNITY): Payer: Self-pay | Admitting: Gastroenterology

## 2012-05-08 ENCOUNTER — Encounter (HOSPITAL_COMMUNITY): Payer: Self-pay

## 2012-05-08 DIAGNOSIS — K315 Obstruction of duodenum: Secondary | ICD-10-CM

## 2012-05-08 DIAGNOSIS — K859 Acute pancreatitis without necrosis or infection, unspecified: Secondary | ICD-10-CM

## 2012-05-08 LAB — COMPREHENSIVE METABOLIC PANEL
ALT: 131 U/L — ABNORMAL HIGH (ref 0–53)
AST: 90 U/L — ABNORMAL HIGH (ref 0–37)
Albumin: 2.4 g/dL — ABNORMAL LOW (ref 3.5–5.2)
CO2: 18 mEq/L — ABNORMAL LOW (ref 19–32)
Chloride: 103 mEq/L (ref 96–112)
Creatinine, Ser: 1.65 mg/dL — ABNORMAL HIGH (ref 0.50–1.35)
GFR calc non Af Amer: 42 mL/min — ABNORMAL LOW (ref >90)
Potassium: 4.5 mEq/L (ref 3.5–5.1)
Sodium: 133 mEq/L — ABNORMAL LOW (ref 135–145)
Total Bilirubin: 10.6 mg/dL — ABNORMAL HIGH (ref 0.3–1.2)

## 2012-05-08 LAB — CBC
Platelets: 200 10*3/uL (ref 150–400)
RBC: 4.8 MIL/uL (ref 4.22–5.81)
RDW: 15.9 % — ABNORMAL HIGH (ref 11.5–15.5)
WBC: 8.3 10*3/uL (ref 4.0–10.5)

## 2012-05-08 LAB — CANCER ANTIGEN 19-9: CA 19-9: 98.8 U/mL — ABNORMAL HIGH (ref <35.0)

## 2012-05-08 MED ORDER — ALBUMIN HUMAN 5 % IV SOLN
12.5000 g | Freq: Once | INTRAVENOUS | Status: AC
  Administered 2012-05-08: 12.5 g via INTRAVENOUS
  Filled 2012-05-08: qty 250

## 2012-05-08 MED ORDER — ALPRAZOLAM 0.25 MG PO TABS
0.2500 mg | ORAL_TABLET | Freq: Two times a day (BID) | ORAL | Status: DC
  Administered 2012-05-08 – 2012-05-20 (×23): 0.25 mg via ORAL
  Filled 2012-05-08 (×23): qty 1

## 2012-05-08 MED ORDER — HYDROXYZINE HCL 10 MG PO TABS
10.0000 mg | ORAL_TABLET | Freq: Three times a day (TID) | ORAL | Status: AC | PRN
  Administered 2012-05-08 – 2012-05-10 (×3): 10 mg via ORAL
  Filled 2012-05-08 (×3): qty 1

## 2012-05-08 MED ORDER — GUAIFENESIN 100 MG/5ML PO SYRP
200.0000 mg | ORAL_SOLUTION | ORAL | Status: DC | PRN
  Administered 2012-05-08 – 2012-05-18 (×9): 200 mg via ORAL
  Filled 2012-05-08 (×5): qty 118

## 2012-05-08 MED ORDER — DEXTROSE-NACL 5-0.45 % IV SOLN
INTRAVENOUS | Status: DC
  Administered 2012-05-08: 22:00:00 via INTRAVENOUS

## 2012-05-08 MED ORDER — SODIUM CHLORIDE 0.9 % IV BOLUS (SEPSIS)
500.0000 mL | Freq: Once | INTRAVENOUS | Status: AC
  Administered 2012-05-08: 500 mL via INTRAVENOUS

## 2012-05-08 NOTE — Progress Notes (Signed)
Subjective:  Awake. Wants to eat. Aware of difficulty with ERCP and stent placement for internal drainage. Afebrile. ++ itching all over.  Objective:  Vital Signs in the last 24 hours: Temp:  [97.5 F (36.4 C)-98.4 F (36.9 C)] 97.8 F (36.6 C) (09/19 0622) Pulse Rate:  [57-106] 75  (09/19 0742) Cardiac Rhythm:  [-] Normal sinus rhythm (09/18 1738) Resp:  [11-31] 18  (09/19 0622) BP: (70-197)/(36-106) 102/49 mmHg (09/19 0742) SpO2:  [21 %-100 %] 93 % (09/19 0622) Weight:  [69.8 kg (153 lb 14.1 oz)] 69.8 kg (153 lb 14.1 oz) (09/19 0622)  Physical Exam: BP Readings from Last 1 Encounters:  05/08/12 102/49    Wt Readings from Last 1 Encounters:  05/08/12 69.8 kg (153 lb 14.1 oz)    Weight change:   HEENT: [place]/AT, Eyes-Brown, PERL, EOMI, Conjunctiva-Pink, Sclera-icteric Neck: No JVD, No bruit, Trachea midline. Lungs:  Clear, Bilateral. Cardiac:  Regular rhythm, normal S1 and S2, no S3.  Abdomen:  Soft, non-tender. + drainage tube with bile. Extremities:  No edema present. No cyanosis. No clubbing. CNS: AxOx3, Cranial nerves grossly intact, moves all 4 extremities. Right handed. Skin: Warm and dry. + excoriations.   Intake/Output from previous day: 09/18 0701 - 09/19 0700 In: 50 [IV Piggyback:50] Out: 75 [Drains:75]    Lab Results: BMET    Component Value Date/Time   NA 133* 05/08/2012 0441   K 4.5 05/08/2012 0441   CL 103 05/08/2012 0441   CO2 18* 05/08/2012 0441   GLUCOSE 117* 05/08/2012 0441   BUN 9 05/08/2012 0441   CREATININE 1.65* 05/08/2012 0441   CALCIUM 8.3* 05/08/2012 0441   GFRNONAA 42* 05/08/2012 0441   GFRAA 49* 05/08/2012 0441   CBC    Component Value Date/Time   WBC 8.3 05/08/2012 0440   RBC 4.80 05/08/2012 0440   HGB 13.6 05/08/2012 0440   HCT 40.4 05/08/2012 0440   PLT 200 05/08/2012 0440   MCV 84.2 05/08/2012 0440   MCH 28.3 05/08/2012 0440   MCHC 33.7 05/08/2012 0440   RDW 15.9* 05/08/2012 0440   LYMPHSABS 1.4 05/05/2012 0543   MONOABS 0.9 05/05/2012 0543   EOSABS 0.3 05/05/2012 0543   BASOSABS 0.0 05/05/2012 0543   CARDIAC ENZYMES Lab Results  Component Value Date   TROPONINI <0.30 05/01/2012    Assessment/Plan:  Patient Active Hospital Problem List:  Acute Pancreatitis probably due to GB stone  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

## 2012-05-08 NOTE — Progress Notes (Signed)
INITIAL ADULT NUTRITION ASSESSMENT Date: 05/08/2012   Time: 3:16 PM Reason for Assessment: Low braden  INTERVENTION: Recommend TNA as pt with duodenal obstruction and no PO intake since admission (7 days). Will monitor.   Pt meets criteria for moderate malnutrition of acute illness as evidenced by pt with <50% estimated energy intake for the past 2-3 weeks per pt and family report with 1.2-1.9% weight loss during this time frame.  ASSESSMENT: Male 66 y.o.  Dx: Biliary obstruction  Food/Nutrition Related Hx: Met with pt and daughter. Daughter did a lot of the speaking in addition to translating for pt. She reports pt typically consumes 1 large meal/day at night and snacks during the day. A typical day would consist of tea for breakfast, tea with milk, cereal, and a biscuit for lunch, and rice, chicken soup, and cooked vegetables for dinner with tortillas. She reports he has been eating less in the past 2 weeks with likely 2-3 pound unintended weight loss r/t abdominal tightness and nausea. She reports pt does not have any nausea today but did not eat much of the liquid diet. Pt had ERCP and upper EUS yesterday which showed pt with possible tumor around the ampulla, descending duodenal wall or uncinate pancreas. Pt had biliary drain placed yesterday with 75ml total output yesterday. Pt with elevated bilirubin that has been increasing since admission. Pt with pancreatitis likely secondary to duodenal obstruction per PA notes.   Hx:  Past Medical History  Diagnosis Date  . Hypertension   . High cholesterol   . Coronary artery disease    Related Meds:  Scheduled Meds:   . albumin human  12.5 g Intravenous Once  . diphenhydrAMINE      . fentaNYL      . flumazenil  0.5 mg Intravenous Once  . HYDROmorphone      . HYDROmorphone      . midazolam      . midazolam      . piperacillin-tazobactam (ZOSYN)  IV  3.375 g Intravenous Q8H  . sodium chloride  500 mL Intravenous Once  . sodium chloride   500 mL Intravenous Once  . sodium chloride  3 mL Intravenous Q12H  . DISCONTD: lisinopril  20 mg Oral Daily   Continuous Infusions:   . dextrose 5 % and 0.45% NaCl 100 mL/hr at 05/08/12 0935  . DISCONTD: dextrose 5 % and 0.45 % NaCl with KCl 20 mEq/L 125 mL/hr at 05/08/12 0614   PRN Meds:.diphenhydrAMINE, diphenhydrAMINE-zinc acetate, fentaNYL, HYDROmorphone, hydrOXYzine, iohexol, midazolam, ondansetron (ZOFRAN) IV, ondansetron, oxyCODONE, DISCONTD: zolpidem  Ht: 5\' 6"  (167.6 cm) (pt. stated)  Wt: 153 lb 14.1 oz (69.8 kg) (bed scale)  Ideal Wt: 142 lb % Ideal Wt: 108  Usual Wt: 155-156 lb per pt report % Usual Wt: 98  Body mass index is 24.84 kg/(m^2).   Labs:  CMP     Component Value Date/Time   NA 133* 05/08/2012 0441   K 4.5 05/08/2012 0441   CL 103 05/08/2012 0441   CO2 18* 05/08/2012 0441   GLUCOSE 117* 05/08/2012 0441   BUN 9 05/08/2012 0441   CREATININE 1.65* 05/08/2012 0441   CALCIUM 8.3* 05/08/2012 0441   PROT 5.7* 05/08/2012 0441   ALBUMIN 2.4* 05/08/2012 0441   AST 90* 05/08/2012 0441   ALT 131* 05/08/2012 0441   ALKPHOS 273* 05/08/2012 0441   BILITOT 10.6* 05/08/2012 0441   GFRNONAA 42* 05/08/2012 0441   GFRAA 49* 05/08/2012 0441   Lipase  Date Value Range  Status  05/08/2012 102* 11 - 59 U/L Final    Intake/Output Summary (Last 24 hours) at 05/08/12 1534 Last data filed at 05/08/12 1021  Gross per 24 hour  Intake    750 ml  Output    175 ml  Net    575 ml   Last BM - 9/16  Diet Order: Full Liquid   IVF:    dextrose 5 % and 0.45% NaCl Last Rate: 100 mL/hr at 05/08/12 0935  DISCONTD: dextrose 5 % and 0.45 % NaCl with KCl 20 mEq/L Last Rate: 125 mL/hr at 05/08/12 1610    Estimated Nutritional Needs:   Kcal:1700-1950 Protein:70-85g Fluid:1.7-1.9L  NUTRITION DIAGNOSIS: -Inadequate oral intake (NI-2.1).  Status: Ongoing  RELATED TO: poor appetite  AS EVIDENCE BY: pt statement, <50% meal intake  MONITORING/EVALUATION(Goals): Initiation of nutrition  support with goal of meeting >90% of estimated nutritional needs.   EDUCATION NEEDS: -No education needs identified at this time   Dietitian #: [phone]  DOCUMENTATION CODES Per approved criteria  -Moderate malnutrition in the context of acute illness or injury    Marshall Cork 05/08/2012, 3:16 PM

## 2012-05-08 NOTE — Progress Notes (Signed)
Feels ok today. Itching all over. Had placement of a external biliary drain yesterday by IR, but they could not get through the distal biliary obstruction. CA-19-9 elevated.  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 ? Any role for surgical intervention.

## 2012-05-08 NOTE — Consult Note (Signed)
I have seen and examined the patient and agree with the assessment and plans. Will have Dr. Donell Beers review the findings Nyonna Hargrove A. Magnus Ivan  MD, FACS

## 2012-05-08 NOTE — Progress Notes (Signed)
1 Day Post-Op  Subjective: Itching all over. Tender at drain. Denies abdominal pain, nausea or vomitting. Benadryl not really effective in controlling itching.   Objective: Vital signs in last 24 hours: Temp:  [97.5 F (36.4 C)-98.4 F (36.9 C)] 98.3 F (36.8 C) (09/19 1019) Pulse Rate:  [57-106] 78  (09/19 1019) Resp:  [11-22] 18  (09/19 1019) BP: (70-197)/(36-106) 122/62 mmHg (09/19 1019) SpO2:  [21 %-100 %] 94 % (09/19 1019) Weight:  [153 lb 14.1 oz (69.8 kg)] 153 lb 14.1 oz (69.8 kg) (09/19 0622) Last BM Date: 05/05/12  Intake/Output from previous day: 09/18 0701 - 09/19 0700 In: 50 [IV Piggyback:50] Out: 77 [Urine:2; Drains:75] Intake/Output this shift: Total I/O In: 700 [P.O.:700] Out: 100 [Drains:100] PE:  Drain intact, jaundiced.  Dressing to drain saturated.  Drain redressed and flushed with 20 ml NS and aspirated back without difficulty, leakage or pain to patient. 175 ml output since placement 9/18.  Approximately 25 ml in bag currently.  Abdomen is soft, positive bowel sounds.   Lab Results:   [facility] 05/08/12 0440  WBC 8.3  HGB 13.6  HCT 40.4  PLT 200   BMET  Basename 05/08/12 0441  NA 133*  K 4.5  CL 103  CO2 18*  GLUCOSE 117*  BUN 9  CREATININE 1.65*  CALCIUM 8.3*   PT/INR  Basename 05/05/12 1056  LABPROT 13.2  INR 0.98    Results for ALANN, AVEY (MRN [id number]) as of 05/08/2012 10:50  Ref. Range 05/08/2012 04:41  Sodium Latest Range: 135-145 mEq/L 133 (L)  Potassium Latest Range: 3.5-5.1 mEq/L 4.5  Chloride Latest Range: 96-112 mEq/L 103  CO2 Latest Range: 19-32 mEq/L 18 (L)  BUN Latest Range: 6-23 mg/dL 9  Creatinine Latest Range: 0.50-1.35 mg/dL 1.19 (H)  Calcium Latest Range: 8.4-10.5 mg/dL 8.3 (L)  GFR calc non Af Amer Latest Range: >90 mL/min 42 (L)  GFR calc Af Amer Latest Range: >90 mL/min 49 (L)  Glucose Latest Range: 70-99 mg/dL 147 (H)  Alkaline Phosphatase Latest Range: 39-117 U/L 273 (H)  Albumin Latest Range: 3.5-5.2  g/dL 2.4 (L)  AST Latest Range: 0-37 U/L 90 (H)  ALT Latest Range: 0-53 U/L 131 (H)  Total Protein Latest Range: 6.0-8.3 g/dL 5.7 (L)  Total Bilirubin Latest Range: 0.3-1.2 mg/dL 82.9 (H)    Studies/Results: Ir Biliary Drain Catheter Placement  05/07/2012  *RADIOLOGY REPORT*  Indication: 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  Failed ERCP. Now with obstructive jaundice.  ULTRASOUND AND FLUOROSCOIC GUIDED PERCUTANEOUS TRANSHEPATIC CHOLANGIOGRAM AND BILIARY TUBE PLACEMENT  Comparisons: CT abdomen pelvis - 05/01/2012; abdominal MRCP - 05/05/2012  Intravenous Medications: Versed 10 mg IV; Fentanyl 300 mcg IV; Dilaudid 6 mg IV; The patient is currently admitted to the hospital on intravenous antibiotics.  Intravenous antibiotics have been administered with an appropriate time frame prior to the initiation of the procedure.  Contrast: 50ml Omnipaque-300  Total Moderate Sedation Time: 90 minutes  Fluoroscopy Time: 28.1 minutes.  Complications: None immediate  Technique:  Informed written consent was obtained from the the patient's daughter after a discussion of the risks, benefits and alternatives to treatment.  Questions regarding the procedure were encouraged and answered.  A timeout was performed prior to the initiation of the procedure.  The right upper abdominal quadrant was prepped and draped in the usual sterile fashion, and a sterile drape was applied covering the operative field.  Maximum barrier sterile technique with sterile gowns and gloves were used for the procedure.  A timeout  was performed prior to the initiation of the procedure.  Ultrasound scanning of the left lobe of the liver failed to delineate dilatation of the peripheral left biliary ducts.  A nondilated biliary duct was attempted to be accessed percutaneously with a 21-gauge Chiba needle after overlying soft tissues were anesthestized with 1% lidocaine, however this optimally proved unsuccessful.  Ultrasound scanning of  the right upper abdominal quadrant was performed to all of the delineate the anatomy and avoid transgression of the pleural. Note was made of marked dilatation of the gallbladder nearly obstructing access to the right lobe of the liver.  A spot along the right mid axillary line was marked fluoroscopically inferior to the right costophrenic angle.  A pass was made into the hepatic parenchyma with a 22 gauge Chiba needle after the overlying soft tissues were anesthetized 1% lidocaine with epinephrine however puncture of the gallbladder was confirmed with injection of a small amount of contrast.  Despite the injection of additional contrast into the gallbladder, no contrast would pass beyond the level of the cystic duct.  Several additional passes were made into the posterior segment of the right lobe of the liver with eventual opacification of a nondilated peripheral biliary duct.  This allowed for access of this non dilated duct with an additional 22 gauge Chiba needle. Appropriate biliary puncture was confirmed with the advancement of a Nitrex wire centrally and dilatation of the tract with an Accustick set.  Several spot radiographic fluoroscopic images were obtained in various obliquities confirming appropriate access.  The Accustick catheter was exchanged for a Kumpe catheter over a [employer] wire.  Contrast injection was performed at the level of the distal CBD.   Limited attempts were made to pass the distal CBD obstruction with both a regular and stiff Glidewire, however this ultimately proved unsuccessful.  Over an Amplatz stiff wire, the tract was dilated and an 8 [place] biliary drainage catheter was advanced with coil ultimately locked within the distal CBD.  Contrast was injected and a completion radiograph was obtained.  The catheter was connected to a drainage bag which yielded the brisk return of dark bile.  The catheter was secured to the skin with an interrupted suture.  The patient tolerated the  procedure well without immediate postprocedural complication.  Findings:  The preprocedural sonographic imaging failed to demonstrate significant dilatation of the peripheral biliary ducts as was demonstrated on preprocedural cross-sectional imaging.  Limited attempts to access the slightly atrophic nondilated left hepatic lobe ultimately proved unsuccessful.  Ultrasound scanning of the right lobe of the liver demonstrated marked distension of the gallbladder and during the initial attempt to access the right lobe of the liver, the gallbladder was inadvertently punctured.  With the gallbladder now delineated, a peripheral right posterior nondilated duct was eventually opacified and ultimately cannulated.  Contrast injection from the distal CBD demonstrates complete obstruction at the level of the ampulla.  Limited attempts to traverse the obstruction proved unsuccessful.  As such, an 8 [place] percutaneous biliary drainage catheter was positioned with coil locked within the distal common bile duct.  Impression:  Successful placement of an 8 French percutaneous biliary drainage catheter with end coiled and locked within the obstructed distal CBD.  Plan:  The patient will return after at least 2 days of external biliary decompression for attempted traversal of the distal CBD obstruction and internalization of the biliary drain.  Above findings discussed with Dr. Evette Cristal at the time of procedure completion.   Original Report Authenticated By: Jonny Ruiz  A. Judithann Sheen, M.D.    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  Failed ERCP. Now with obstructive jaundice.  ULTRASOUND AND FLUOROSCOIC GUIDED PERCUTANEOUS TRANSHEPATIC CHOLANGIOGRAM AND BILIARY TUBE PLACEMENT  Comparisons: CT abdomen pelvis - 05/01/2012; abdominal MRCP - 05/05/2012  Intravenous Medications: Versed 10 mg IV; Fentanyl 300 mcg IV; Dilaudid 6 mg IV; The patient is currently admitted to the  hospital on intravenous antibiotics.  Intravenous antibiotics have been administered with an appropriate time frame prior to the initiation of the procedure.  Contrast: 50ml Omnipaque-300  Total Moderate Sedation Time: 90 minutes  Fluoroscopy Time: 28.1 minutes.  Complications: None immediate  Technique:  Informed written consent was obtained from the the patient's daughter after a discussion of the risks, benefits and alternatives to treatment.  Questions regarding the procedure were encouraged and answered.  A timeout was performed prior to the initiation of the procedure.  The right upper abdominal quadrant was prepped and draped in the usual sterile fashion, and a sterile drape was applied covering the operative field.  Maximum barrier sterile technique with sterile gowns and gloves were used for the procedure.  A timeout was performed prior to the initiation of the procedure.  Ultrasound scanning of the left lobe of the liver failed to delineate dilatation of the peripheral left biliary ducts.  A nondilated biliary duct was attempted to be accessed percutaneously with a 21-gauge Chiba needle after overlying soft tissues were anesthestized with 1% lidocaine, however this optimally proved unsuccessful.  Ultrasound scanning of the right upper abdominal quadrant was performed to all of the delineate the anatomy and avoid transgression of the pleural. Note was made of marked dilatation of the gallbladder nearly obstructing access to the right lobe of the liver.  A spot along the right mid axillary line was marked fluoroscopically inferior to the right costophrenic angle.  A pass was made into the hepatic parenchyma with a 22 gauge Chiba needle after the overlying soft tissues were anesthetized 1% lidocaine with epinephrine however puncture of the gallbladder was confirmed with injection of a small amount of contrast.  Despite the injection of additional contrast into the gallbladder, no contrast would pass beyond  the level of the cystic duct.  Several additional passes were made into the posterior segment of the right lobe of the liver with eventual opacification of a nondilated peripheral biliary duct.  This allowed for access of this non dilated duct with an additional 22 gauge Chiba needle. Appropriate biliary puncture was confirmed with the advancement of a Nitrex wire centrally and dilatation of the tract with an Accustick set.  Several spot radiographic fluoroscopic images were obtained in various obliquities confirming appropriate access.  The Accustick catheter was exchanged for a Kumpe catheter over a [employer] wire.  Contrast injection was performed at the level of the distal CBD.   Limited attempts were made to pass the distal CBD obstruction with both a regular and stiff Glidewire, however this ultimately proved unsuccessful.  Over an Amplatz stiff wire, the tract was dilated and an 8 [place] biliary drainage catheter was advanced with coil ultimately locked within the distal CBD.  Contrast was injected and a completion radiograph was obtained.  The catheter was connected to a drainage bag which yielded the brisk return of dark bile.  The catheter was secured to the skin with an interrupted suture.  The patient tolerated the procedure well without immediate postprocedural complication.  Findings:  The preprocedural  sonographic imaging failed to demonstrate significant dilatation of the peripheral biliary ducts as was demonstrated on preprocedural cross-sectional imaging.  Limited attempts to access the slightly atrophic nondilated left hepatic lobe ultimately proved unsuccessful.  Ultrasound scanning of the right lobe of the liver demonstrated marked distension of the gallbladder and during the initial attempt to access the right lobe of the liver, the gallbladder was inadvertently punctured.  With the gallbladder now delineated, a peripheral right posterior nondilated duct was eventually opacified and ultimately  cannulated.  Contrast injection from the distal CBD demonstrates complete obstruction at the level of the ampulla.  Limited attempts to traverse the obstruction proved unsuccessful.  As such, an 8 [place] percutaneous biliary drainage catheter was positioned with coil locked within the distal common bile duct.  Impression:  Successful placement of an 8 French percutaneous biliary drainage catheter with end coiled and locked within the obstructed distal CBD.  Plan:  The patient will return after at least 2 days of external biliary decompression for attempted traversal of the distal CBD obstruction and internalization of the biliary drain.  Above findings discussed with Dr. Evette Cristal at the time of procedure completion.   Original Report Authenticated By: Waynard Reeds, M.D.     Anti-infectives: Anti-infectives     Start     Dose/Rate Route Frequency Ordered Stop   05/04/12 1100  piperacillin-tazobactam (ZOSYN) IVPB 3.375 g       3.375 g 12.5 mL/hr over 240 Minutes Intravenous Every 8 hours 05/04/12 1013     05/04/12 1000   piperacillin-tazo (ZOSYN) NICU IV syringe 200 mg/mL  Status:  Discontinued        75 mg/kg  70.4 kg 52.8 mL/hr over 30 Minutes Intravenous Every 8 hours 05/04/12 0947 05/04/12 1011          Assessment/Plan: CBD obstruction with jaundice and hyperbilirubinemia s/p external drain by IR 9/18. Bili still elevated and no significant output from drain.  Will recheck am labs and discuss with Dr. Archer Asa - may need to allow further decompression over the weekend prior to any further attempts for internalization. Will add vistaril to meds today and tomorrow to see if helps with itching while bilirubin is still elevated.  Kamie Korber D 05/08/2012

## 2012-05-08 NOTE — Consult Note (Signed)
Colin Castro 27-Apr-1946  [id number].   Primary Care MD: Dr. Orpah Cobb Requesting MD: Dr. Herbert Moors Chief Complaint/Reason for Consult: duodenal obstruction HPI: This is a 66 yo [place] male who began having itching about 2-3 weeks ago.  He saw his PCP who gave him Benadryl, checked labs, and had him follow up in a week.  He then began complaining of upper abdominal pain.  He had some nausea and was unable to eat very much at all.  He went back to see his PCP who drew more labs and was found to have a lipase > 3000 and elevated LFTs.  He was admitted for further observation.  In the meantime, he had a CT scan and MRI all of which show thickening of the duodenum, intra and extrahepatic ductal dilatation c/w possible malignancy.  GI was asked to see the patient.  An EGD was done as well as EUS.  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  The EUS was fairly unremarkable as well secondary to this same problem.  Biopsies of the duodenum at the obstruction site were taken though and are pending.  He does have an elevated CA 19-9 of 98.  Due to worsening TB, a PTC  And biliary drain was placed in the gallbladder for decompression.  We have been asked to evaluate the patient for surgical recommendations.  Review of Systems: Please see HPI, otherwise all other systems have been reviewed and are negative.  History reviewed. No pertinent family history.  Past Medical History  Diagnosis Date  . Hypertension   . High cholesterol   . Coronary artery disease     Past Surgical History  Procedure Date  . Ercp 05/07/2012    Procedure: ENDOSCOPIC RETROGRADE CHOLANGIOPANCREATOGRAPHY (ERCP);  Surgeon: [name], MD;  Location: [facility];  Service: Endoscopy;  Laterality: N/A;  Dr [name]  . Eus 05/07/2012    Procedure: UPPER ENDOSCOPIC ULTRASOUND (EUS) RADIAL;  Surgeon: [name], MD;  Location: [facility];  Service: Endoscopy;;  . Appendectomy     open, 25 yrs ago  . Coronary stent placement     2 stents placed    Social History:  reports that he has been smoking Cigarettes.  He has a 12 pack-year smoking history. He has never used smokeless tobacco. He reports that he does not drink alcohol or use illicit drugs.  Allergies: No Known Allergies  Medications Prior to Admission  Medication Sig Dispense Refill  . atorvastatin (LIPITOR) 10 MG tablet Take 10 mg by mouth daily.      [name] lisinopril (PRINIVIL,ZESTRIL) 20 MG tablet Take 20 mg by mouth daily.      . pravastatin (PRAVACHOL) 40 MG tablet Take 40 mg by mouth daily.      . predniSONE (DELTASONE) 10 MG tablet Take 10-20 mg by mouth daily. Taper dosing     Started at 20mg  for first four days then 10mg  for rest of days.     Was taking for itching.        Blood pressure 111/97, pulse 79, temperature 97.7 F (36.5 C), temperature source Oral, resp. rate 20, height 5\' 6"  (1.676 m), weight 153 lb 14.1 oz (69.8 kg), SpO2 91.00%. Physical Exam: General: pleasant, WD, WN [place] male who is laying in bed in NAD HEENT: head is normocephalic, atraumatic.  Sclera are icteric.  PERRL.  Ears and nose without any masses or lesions.  Mouth is pink and moist.  Very poor dentition! Heart: regular, rate, and rhythm.  Normal s1,s2. No obvious murmurs, gallops, or rubs noted.  Palpable radial and pedal pulses bilaterally Lungs: CTAB, no wheezes, rhonchi, or rales noted.  Respiratory effort nonlabored Abd: soft, NT, ND, +BS, no masses, hernias, or organomegaly MS: all 4 extremities are symmetrical with no cyanosis, clubbing, or edema. Skin: warm and dry with no masses, lesions, or rashes, jaundice is difficult to observe due to skin color Psych: A&Ox3 with an appropriate affect.    Results for orders placed during the hospital encounter of 05/01/12 (from the past 48 hour(s))  CEA     Status: Normal   Collection Time    05/07/12 12:20 PM      Component Value Range Comment   CEA 1.3  0.0 - 5.0 ng/mL   CANCER ANTIGEN 19-9     Status: Abnormal   Collection Time   05/07/12 12:20 PM      Component Value Range Comment   CA 19-9 98.8 (*) <35.0 U/mL   CBC     Status: Abnormal   Collection Time   05/08/12  4:40 AM      Component Value Range Comment   WBC 8.3  4.0 - 10.5 K/uL    RBC 4.80  4.22 - 5.81 MIL/uL    Hemoglobin 13.6  13.0 - 17.0 g/dL    HCT 40.9  81.1 - 91.4 %    MCV 84.2  78.0 - 100.0 fL    MCH 28.3  26.0 - 34.0 pg    MCHC 33.7  30.0 - 36.0 g/dL    RDW 78.2 (*) 95.6 - 15.5 %    Platelets 200  150 - 400 K/uL   LIPASE, BLOOD     Status: Abnormal   Collection Time   05/08/12  4:40 AM      Component Value Range Comment   Lipase 102 (*) 11 - 59 U/L   COMPREHENSIVE METABOLIC PANEL     Status: Abnormal   Collection Time   05/08/12  4:41 AM      Component Value Range Comment   Sodium 133 (*) 135 - 145 mEq/L    Potassium 4.5  3.5 - 5.1 mEq/L    Chloride 103  96 - 112 mEq/L    CO2 18 (*) 19 - 32 mEq/L    Glucose, Bld 117 (*) 70 - 99 mg/dL    BUN 9  6 - 23 mg/dL    Creatinine, Ser 2.13 (*) 0.50 - 1.35 mg/dL    Calcium 8.3 (*) 8.4 - 10.5 mg/dL    Total Protein 5.7 (*) 6.0 - 8.3 g/dL    Albumin 2.4 (*) 3.5 - 5.2 g/dL    AST 90 (*) 0 - 37 U/L    ALT 131 (*) 0 - 53 U/L    Alkaline Phosphatase 273 (*) 39 - 117 U/L    Total Bilirubin 10.6 (*) 0.3 - 1.2 mg/dL    GFR calc non Af Amer 42 (*) >90 mL/min    GFR calc Af Amer 49 (*) >90 mL/min    Ir Biliary Drain Catheter Placement  05/07/2012  *RADIOLOGY REPORT*  Indication: 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  Failed ERCP. Now with obstructive jaundice.  ULTRASOUND AND FLUOROSCOIC GUIDED PERCUTANEOUS TRANSHEPATIC CHOLANGIOGRAM AND BILIARY TUBE PLACEMENT  Comparisons: CT abdomen pelvis - 05/01/2012; abdominal MRCP - 05/05/2012  Intravenous Medications: Versed 10 mg IV; Fentanyl 300 mcg IV; Dilaudid 6 mg IV; The patient is currently  admitted to the hospital on intravenous antibiotics.  Intravenous antibiotics have been administered with an appropriate time frame prior to the initiation of the procedure.  Contrast: 50ml Omnipaque-300  Total Moderate Sedation Time: 90 minutes  Fluoroscopy Time: 28.1 minutes.  Complications: None immediate  Technique:  Informed written consent was obtained from the the patient's daughter after a discussion of the risks, benefits and alternatives to treatment.  Questions regarding the procedure were encouraged and answered.  A timeout was performed prior to the initiation of the procedure.  The right upper abdominal quadrant was prepped and draped in the usual sterile fashion, and a sterile drape was applied covering the operative field.  Maximum barrier sterile technique with sterile gowns and gloves were used for the procedure.  A timeout was performed prior to the initiation of the procedure.  Ultrasound scanning of the left lobe of the liver failed to delineate dilatation of the peripheral left biliary ducts.  A nondilated biliary duct was attempted to be accessed percutaneously with a 21-gauge Chiba needle after overlying soft tissues were anesthestized with 1% lidocaine, however this optimally proved unsuccessful.  Ultrasound scanning of the right upper abdominal quadrant was performed to all of the delineate the anatomy and avoid transgression of the pleural. Note was made of marked dilatation of the gallbladder nearly obstructing access to the right lobe of the liver.  A spot along the right mid axillary line was marked fluoroscopically inferior to the right costophrenic angle.  A pass was made into the hepatic parenchyma with a 22 gauge Chiba needle after the overlying soft tissues were anesthetized 1% lidocaine with epinephrine however puncture of the gallbladder was confirmed with injection of a small amount of contrast.  Despite the injection of additional contrast into the gallbladder, no contrast  would pass beyond the level of the cystic duct.  Several additional passes were made into the posterior segment of the right lobe of the liver with eventual opacification of a nondilated peripheral biliary duct.  This allowed for access of this non dilated duct with an additional 22 gauge Chiba needle. Appropriate biliary puncture was confirmed with the advancement of a Nitrex wire centrally and dilatation of the tract with an Accustick set.  Several spot radiographic fluoroscopic images were obtained in various obliquities confirming appropriate access.  The Accustick catheter was exchanged for a Kumpe catheter over a [employer] wire.  Contrast injection was performed at the level of the distal CBD.   Limited attempts were made to pass the distal CBD obstruction with both a regular and stiff Glidewire, however this ultimately proved unsuccessful.  Over an Amplatz stiff wire, the tract was dilated and an 8 [place] biliary drainage catheter was advanced with coil ultimately locked within the distal CBD.  Contrast was injected and a completion radiograph was obtained.  The catheter was connected to a drainage bag which yielded the brisk return of dark bile.  The catheter was secured to the skin with an interrupted suture.  The patient tolerated the procedure well without immediate postprocedural complication.  Findings:  The preprocedural sonographic imaging failed to demonstrate significant dilatation of the peripheral biliary ducts as was demonstrated on preprocedural cross-sectional imaging.  Limited attempts to access the slightly atrophic nondilated left hepatic lobe ultimately proved unsuccessful.  Ultrasound scanning of the right lobe of the liver demonstrated marked distension of the gallbladder and during the initial  attempt to access the right lobe of the liver, the gallbladder was inadvertently punctured.  With the gallbladder now delineated, a peripheral right posterior nondilated duct was eventually opacified  and ultimately cannulated.  Contrast injection from the distal CBD demonstrates complete obstruction at the level of the ampulla.  Limited attempts to traverse the obstruction proved unsuccessful.  As such, an 8 [place] percutaneous biliary drainage catheter was positioned with coil locked within the distal common bile duct.  Impression:  Successful placement of an 8 French percutaneous biliary drainage catheter with end coiled and locked within the obstructed distal CBD.  Plan:  The patient will return after at least 2 days of external biliary decompression for attempted traversal of the distal CBD obstruction and internalization of the biliary drain.  Above findings discussed with Dr. Evette Cristal at the time of procedure completion.   Original Report Authenticated By: Waynard Reeds, M.D.    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  Failed ERCP. Now with obstructive jaundice.  ULTRASOUND AND FLUOROSCOIC GUIDED PERCUTANEOUS TRANSHEPATIC CHOLANGIOGRAM AND BILIARY TUBE PLACEMENT  Comparisons: CT abdomen pelvis - 05/01/2012; abdominal MRCP - 05/05/2012  Intravenous Medications: Versed 10 mg IV; Fentanyl 300 mcg IV; Dilaudid 6 mg IV; The patient is currently admitted to the hospital on intravenous antibiotics.  Intravenous antibiotics have been administered with an appropriate time frame prior to the initiation of the procedure.  Contrast: 50ml Omnipaque-300  Total Moderate Sedation Time: 90 minutes  Fluoroscopy Time: 28.1 minutes.  Complications: None immediate  Technique:  Informed written consent was obtained from the the patient's daughter after a discussion of the risks, benefits and alternatives to treatment.  Questions regarding the procedure were encouraged and answered.  A timeout was performed prior to the initiation of the procedure.  The right upper abdominal quadrant was prepped and draped in the usual sterile fashion, and a sterile drape  was applied covering the operative field.  Maximum barrier sterile technique with sterile gowns and gloves were used for the procedure.  A timeout was performed prior to the initiation of the procedure.  Ultrasound scanning of the left lobe of the liver failed to delineate dilatation of the peripheral left biliary ducts.  A nondilated biliary duct was attempted to be accessed percutaneously with a 21-gauge Chiba needle after overlying soft tissues were anesthestized with 1% lidocaine, however this optimally proved unsuccessful.  Ultrasound scanning of the right upper abdominal quadrant was performed to all of the delineate the anatomy and avoid transgression of the pleural. Note was made of marked dilatation of the gallbladder nearly obstructing access to the right lobe of the liver.  A spot along the right mid axillary line was marked fluoroscopically inferior to the right costophrenic angle.  A pass was made into the hepatic parenchyma with a 22 gauge Chiba needle after the overlying soft tissues were anesthetized 1% lidocaine with epinephrine however puncture of the gallbladder was confirmed with injection of a small amount of contrast.  Despite the injection of additional contrast into the gallbladder, no contrast would pass beyond the level of the cystic duct.  Several additional passes were made into the posterior segment of the right lobe of the liver with eventual opacification of a nondilated peripheral biliary duct.  This allowed for access of this non dilated duct with an additional 22 gauge Chiba needle. Appropriate biliary puncture was confirmed with the advancement of a Nitrex wire centrally and dilatation of the  tract with an Accustick set.  Several spot radiographic fluoroscopic images were obtained in various obliquities confirming appropriate access.  The Accustick catheter was exchanged for a Kumpe catheter over a [employer] wire.  Contrast injection was performed at the level of the distal CBD.    Limited attempts were made to pass the distal CBD obstruction with both a regular and stiff Glidewire, however this ultimately proved unsuccessful.  Over an Amplatz stiff wire, the tract was dilated and an 8 [place] biliary drainage catheter was advanced with coil ultimately locked within the distal CBD.  Contrast was injected and a completion radiograph was obtained.  The catheter was connected to a drainage bag which yielded the brisk return of dark bile.  The catheter was secured to the skin with an interrupted suture.  The patient tolerated the procedure well without immediate postprocedural complication.  Findings:  The preprocedural sonographic imaging failed to demonstrate significant dilatation of the peripheral biliary ducts as was demonstrated on preprocedural cross-sectional imaging.  Limited attempts to access the slightly atrophic nondilated left hepatic lobe ultimately proved unsuccessful.  Ultrasound scanning of the right lobe of the liver demonstrated marked distension of the gallbladder and during the initial attempt to access the right lobe of the liver, the gallbladder was inadvertently punctured.  With the gallbladder now delineated, a peripheral right posterior nondilated duct was eventually opacified and ultimately cannulated.  Contrast injection from the distal CBD demonstrates complete obstruction at the level of the ampulla.  Limited attempts to traverse the obstruction proved unsuccessful.  As such, an 8 [place] percutaneous biliary drainage catheter was positioned with coil locked within the distal common bile duct.  Impression:  Successful placement of an 8 French percutaneous biliary drainage catheter with end coiled and locked within the obstructed distal CBD.  Plan:  The patient will return after at least 2 days of external biliary decompression for attempted traversal of the distal CBD obstruction and internalization of the biliary drain.  Above findings discussed with Dr. Evette Cristal at the  time of procedure completion.   Original Report Authenticated By: Waynard Reeds, M.D.        Assessment/Plan 1. 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 Hyperbilirubinemia 3. 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 H/o CAD with stenting  Plan: 1. I will need to review this case with my surgeon.  The patient will likely need some type of an operation whether this are is benign or malignant given multiple obstructions of his bowel and biliary tree.  This would likely be a large operation.  We will currently await his pathology results from his biopsies.  He will likely need a PICC and TNA given he is unable to eat.  I have explained all of this to his daughter who has explained it to him.  They both understand what is going on and the possibilities.  We will follow along.  Thank you for this consultation. Develle Sievers E 05/08/2012, 2:09 PM Pager: [phone]

## 2012-05-08 NOTE — Progress Notes (Signed)
Agree.  Biliary drain flushed today, will re-evaluate in the morning.  If drainage continues to be scant and Br elevated, we may consider upsizing the existing 54f drain.    Signed,  Sterling Big, MD Vascular & Interventional Radiologist [facility] Radiology

## 2012-05-08 NOTE — Progress Notes (Signed)
Notified Dr. Algie Coffer of pt's manual BP of 70/40. Order given to give 500cc bolus of NS. Will recheck BP after bolus and notify MD. Pt is arousable at this time but quickly falls back to sleep.

## 2012-05-09 ENCOUNTER — Inpatient Hospital Stay (HOSPITAL_COMMUNITY): Payer: Medicaid Other

## 2012-05-09 DIAGNOSIS — E46 Unspecified protein-calorie malnutrition: Secondary | ICD-10-CM

## 2012-05-09 LAB — DIFFERENTIAL
Basophils Absolute: 0.1 10*3/uL (ref 0.0–0.1)
Basophils Relative: 1 % (ref 0–1)
Eosinophils Absolute: 0.3 10*3/uL (ref 0.0–0.7)
Eosinophils Relative: 4 % (ref 0–5)
Monocytes Absolute: 1 10*3/uL (ref 0.1–1.0)

## 2012-05-09 LAB — MAGNESIUM: Magnesium: 2 mg/dL (ref 1.5–2.5)

## 2012-05-09 LAB — CBC
HCT: 44.3 % (ref 39.0–52.0)
MCH: 28.4 pg (ref 26.0–34.0)
MCHC: 34.5 g/dL (ref 30.0–36.0)
MCV: 82.2 fL (ref 78.0–100.0)
RDW: 16.1 % — ABNORMAL HIGH (ref 11.5–15.5)

## 2012-05-09 LAB — COMPREHENSIVE METABOLIC PANEL
ALT: 127 U/L — ABNORMAL HIGH (ref 0–53)
AST: 80 U/L — ABNORMAL HIGH (ref 0–37)
Albumin: 3.2 g/dL — ABNORMAL LOW (ref 3.5–5.2)
Alkaline Phosphatase: 354 U/L — ABNORMAL HIGH (ref 39–117)
Calcium: 9.5 mg/dL (ref 8.4–10.5)
GFR calc non Af Amer: 84 mL/min — ABNORMAL LOW (ref >90)
Glucose, Bld: 89 mg/dL (ref 70–99)
Potassium: 3.2 mEq/L — ABNORMAL LOW (ref 3.5–5.1)
Sodium: 132 mEq/L — ABNORMAL LOW (ref 135–145)
Total Bilirubin: 12.2 mg/dL — ABNORMAL HIGH (ref 0.3–1.2)
Total Protein: 7.2 g/dL (ref 6.0–8.3)

## 2012-05-09 LAB — GLUCOSE, CAPILLARY: Glucose-Capillary: 92 mg/dL (ref 70–99)

## 2012-05-09 LAB — PREALBUMIN: Prealbumin: 12.6 mg/dL — ABNORMAL LOW (ref 17.0–34.0)

## 2012-05-09 MED ORDER — DEXTROSE-NACL 5-0.45 % IV SOLN: INTRAVENOUS | Status: DC

## 2012-05-09 MED ORDER — HYDROMORPHONE HCL PF 2 MG/ML IJ SOLN
INTRAMUSCULAR | Status: AC
  Filled 2012-05-09: qty 1

## 2012-05-09 MED ORDER — POTASSIUM CHLORIDE 10 MEQ/100ML IV SOLN
10.0000 meq | INTRAVENOUS | Status: AC
  Administered 2012-05-09: 10 meq via INTRAVENOUS
  Filled 2012-05-09 (×4): qty 100

## 2012-05-09 MED ORDER — KCL IN DEXTROSE-NACL 20-5-0.9 MEQ/L-%-% IV SOLN
INTRAVENOUS | Status: DC
  Administered 2012-05-09: 13:00:00 via INTRAVENOUS
  Filled 2012-05-09 (×2): qty 1000

## 2012-05-09 MED ORDER — FENTANYL CITRATE 0.05 MG/ML IJ SOLN
INTRAMUSCULAR | Status: AC | PRN
  Administered 2012-05-09: 100 ug via INTRAVENOUS

## 2012-05-09 MED ORDER — INSULIN ASPART 100 UNIT/ML ~~LOC~~ SOLN
0.0000 [IU] | Freq: Four times a day (QID) | SUBCUTANEOUS | Status: DC
  Administered 2012-05-10 – 2012-05-11 (×5): 1 [IU] via SUBCUTANEOUS
  Administered 2012-05-13 (×2): 2 [IU] via SUBCUTANEOUS
  Administered 2012-05-13: 1 [IU] via SUBCUTANEOUS
  Administered 2012-05-13: 2 [IU] via SUBCUTANEOUS
  Administered 2012-05-15 – 2012-05-16 (×5): 1 [IU] via SUBCUTANEOUS

## 2012-05-09 MED ORDER — SODIUM CHLORIDE 0.9 % IJ SOLN
10.0000 mL | INTRAMUSCULAR | Status: DC | PRN
  Administered 2012-05-15 – 2012-05-23 (×8): 10 mL

## 2012-05-09 MED ORDER — SODIUM CHLORIDE 0.9 % IV SOLN
INTRAVENOUS | Status: DC
  Administered 2012-05-12: 20 mL via INTRAVENOUS

## 2012-05-09 MED ORDER — KCL IN DEXTROSE-NACL 20-5-0.9 MEQ/L-%-% IV SOLN
INTRAVENOUS | Status: AC
  Administered 2012-05-09 – 2012-05-10 (×3): via INTRAVENOUS
  Filled 2012-05-09 (×3): qty 1000

## 2012-05-09 MED ORDER — HYDROMORPHONE HCL PF 1 MG/ML IJ SOLN
INTRAMUSCULAR | Status: AC | PRN
  Administered 2012-05-09: 2 mg via INTRAVENOUS

## 2012-05-09 MED ORDER — MIDAZOLAM HCL 5 MG/5ML IJ SOLN
INTRAMUSCULAR | Status: AC | PRN
  Administered 2012-05-09: 2 mg via INTRAVENOUS

## 2012-05-09 MED ORDER — IOHEXOL 300 MG/ML  SOLN
80.0000 mL | Freq: Once | INTRAMUSCULAR | Status: AC | PRN
  Administered 2012-05-09: 80 mL via INTRAVENOUS

## 2012-05-09 MED ORDER — ZINC TRACE METAL 1 MG/ML IV SOLN
INTRAVENOUS | Status: AC
  Administered 2012-05-09: 17:00:00 via INTRAVENOUS
  Filled 2012-05-09: qty 1000

## 2012-05-09 NOTE — Procedures (Signed)
Interventional Radiology Procedure Note  Procedure: Cholangiogram and internalization and upsizing of the existing percutaneous biliary drain.  Complications: None Recommendations: - Maintain to bag drainage - If bilirubin continues to increase, it is likely due to underlying hepatocellular dysfunction - Internal/external drain can be capped after sufficient decrease in serum bilirubin  Signed,  Sterling Big, MD Vascular & Interventional Radiologist [facility] Radiology

## 2012-05-09 NOTE — Progress Notes (Signed)
Subjective:  Awake, + itching. On IV zosyn. Afebrile. Awaiting additional r/o cancer evaluation and possible surgery. CT chest negative for possible metastasis  Objective:  Vital Signs in the last 24 hours: Temp:  [97.6 F (36.4 C)-98.5 F (36.9 C)] 97.6 F (36.4 C) (09/20 1745) Pulse Rate:  [63-83] 76  (09/20 1745) Cardiac Rhythm:  [-] Normal sinus rhythm (09/20 1559) Resp:  [13-28] 16  (09/20 1745) BP: (119-209)/(63-102) 126/82 mmHg (09/20 1745) SpO2:  [91 %-97 %] 94 % (09/20 1559)  Physical Exam: BP Readings from Last 1 Encounters:  05/09/12 126/82    Wt Readings from Last 1 Encounters:  05/08/12 69.8 kg (153 lb 14.1 oz)    Weight change:   HEENT: [place]/AT, Eyes-Brown, PERL, EOMI, Conjunctiva-Pink, Sclera-icteric Neck: No JVD, No bruit, Trachea midline. Lungs:  Clear, Bilateral. Cardiac:  Regular rhythm, normal S1 and S2, no S3.  Abdomen:  Soft, non-tender. + drainage tube. Extremities:  No edema present. No cyanosis. No clubbing. CNS: AxOx3, Cranial nerves grossly intact, moves all 4 extremities. Right handed. Skin: Warm and dry.   Intake/Output from previous day: 09/19 0701 - 09/20 0700 In: 3091.7 [P.O.:700; I.V.:1941.7; IV Piggyback:450] Out: 500 [Drains:500]    Lab Results: BMET    Component Value Date/Time   NA 132* 05/09/2012 0440   K 3.2* 05/09/2012 0440   CL 97 05/09/2012 0440   CO2 21 05/09/2012 0440   GLUCOSE 89 05/09/2012 0440   BUN 4* 05/09/2012 0440   CREATININE 0.99 05/09/2012 0440   CALCIUM 9.5 05/09/2012 0440   GFRNONAA 84* 05/09/2012 0440   GFRAA >90 05/09/2012 0440   CBC    Component Value Date/Time   WBC 8.9 05/09/2012 0440   RBC 5.39 05/09/2012 0440   HGB 15.3 05/09/2012 0440   HCT 44.3 05/09/2012 0440   PLT 177 05/09/2012 0440   MCV 82.2 05/09/2012 0440   MCH 28.4 05/09/2012 0440   MCHC 34.5 05/09/2012 0440   RDW 16.1* 05/09/2012 0440   LYMPHSABS 1.8 05/09/2012 0440   MONOABS 1.0 05/09/2012 0440   EOSABS 0.3 05/09/2012 0440   BASOSABS 0.1 05/09/2012  0440   CARDIAC ENZYMES Lab Results  Component Value Date   TROPONINI <0.30 05/01/2012    Assessment/Plan:  Patient Active Hospital Problem List:  Acute Pancreatitis probably due to GB stone  R/O Ampullary stricture/Neoplasm  (with elevated CA 19-9 and normal CEA)  Diverticulosis  Right inguinal hernia  Duodenal stricture  CBD biliary drain  Obstructive jaundice  Hypoalbuminemia  Acute renal failure due to contrast use and hypovolemia/hypotension  -Improving  Follow with GI and Surgery   LOS: 8 days    Orpah Cobb  MD  05/09/2012, 5:57 PM

## 2012-05-09 NOTE — Progress Notes (Signed)
Peripherally Inserted Central Catheter/Midline Placement  The IV Nurse has discussed with the patient and/or persons authorized to consent for the patient, the purpose of this procedure and the potential benefits and risks involved with this procedure.  The benefits include less needle sticks, lab draws from the catheter and patient may be discharged home with the catheter.  Risks include, but not limited to, infection, bleeding, blood clot (thrombus formation), and puncture of an artery; nerve damage and irregular heat beat.  Alternatives to this procedure were also discussed.  PICC/Midline Placement Documentation  PICC / Midline Double Lumen 05/09/12 PICC Right Basilic (Active)       Stacie Glaze Horton 05/09/2012, 2:47 PM

## 2012-05-09 NOTE — Progress Notes (Signed)
The patient has no specific complaints today. He is going back down today for a repeat biliary drain. His bilirubin has gone up despite the current biliary drain that is in place. The surgical team has seen him and are reviewing his case with Dr. Donell Beers to see whether he might be a candidate for a Whipple's procedure.

## 2012-05-09 NOTE — Progress Notes (Addendum)
Patient ID: Colin Castro, male   DOB: [birth year], 66 y.o.   MRN: [id number] 2 Days Post-Op  Subjective: Pt without c/o.  No pain this morning  Objective: Vital signs in last 24 hours: Temp:  [97.7 F (36.5 C)-98.3 F (36.8 C)] 97.8 F (36.6 C) (09/20 0220) Pulse Rate:  [74-80] 74  (09/20 0220) Resp:  [18-20] 18  (09/20 0220) BP: (102-155)/(49-97) 155/63 mmHg (09/20 0220) SpO2:  [91 %-96 %] 91 % (09/20 0220) Last BM Date: 05/05/12  Intake/Output from previous day: 09/19 0701 - 09/20 0700 In: 3091.7 [P.O.:700; I.V.:1941.7; IV Piggyback:450] Out: 500 [Drains:500] Intake/Output this shift:    PE: Abd: soft, NT, ND, drain with bilious output.  Lab Results:   Basename 05/09/12 0440 05/08/12 0440  WBC 8.9 8.3  HGB 15.3 13.6  HCT 44.3 40.4  PLT 177 200   BMET  Basename 05/09/12 0440 05/08/12 0441  NA 132* 133*  K 3.2* 4.5  CL 97 103  CO2 21 18*  GLUCOSE 89 117*  BUN 4* 9  CREATININE 0.99 1.65*  CALCIUM 9.5 8.3*   PT/INR No results found for this basename: LABPROT:2,INR:2 in the last 72 hours CMP     Component Value Date/Time   NA 132* 05/09/2012 0440   K 3.2* 05/09/2012 0440   CL 97 05/09/2012 0440   CO2 21 05/09/2012 0440   GLUCOSE 89 05/09/2012 0440   BUN 4* 05/09/2012 0440   CREATININE 0.99 05/09/2012 0440   CALCIUM 9.5 05/09/2012 0440   PROT 7.2 05/09/2012 0440   ALBUMIN 3.2* 05/09/2012 0440   AST 80* 05/09/2012 0440   ALT 127* 05/09/2012 0440   ALKPHOS 354* 05/09/2012 0440   BILITOT 12.2* 05/09/2012 0440   GFRNONAA 84* 05/09/2012 0440   GFRAA >90 05/09/2012 0440   Lipase     Component Value Date/Time   LIPASE 102* 05/08/2012 0440       Studies/Results: Ir Biliary Drain Catheter Placement  05/07/2012  *RADIOLOGY REPORT*  Indication: 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  Failed ERCP. Now with obstructive jaundice.  ULTRASOUND AND FLUOROSCOIC GUIDED PERCUTANEOUS TRANSHEPATIC CHOLANGIOGRAM AND BILIARY TUBE PLACEMENT  Comparisons: CT abdomen  pelvis - 05/01/2012; abdominal MRCP - 05/05/2012  Intravenous Medications: Versed 10 mg IV; Fentanyl 300 mcg IV; Dilaudid 6 mg IV; The patient is currently admitted to the hospital on intravenous antibiotics.  Intravenous antibiotics have been administered with an appropriate time frame prior to the initiation of the procedure.  Contrast: 50ml Omnipaque-300  Total Moderate Sedation Time: 90 minutes  Fluoroscopy Time: 28.1 minutes.  Complications: None immediate  Technique:  Informed written consent was obtained from the the patient's daughter after a discussion of the risks, benefits and alternatives to treatment.  Questions regarding the procedure were encouraged and answered.  A timeout was performed prior to the initiation of the procedure.  The right upper abdominal quadrant was prepped and draped in the usual sterile fashion, and a sterile drape was applied covering the operative field.  Maximum barrier sterile technique with sterile gowns and gloves were used for the procedure.  A timeout was performed prior to the initiation of the procedure.  Ultrasound scanning of the left lobe of the liver failed to delineate dilatation of the peripheral left biliary ducts.  A nondilated biliary duct was attempted to be accessed percutaneously with a 21-gauge Chiba needle after overlying soft tissues were anesthestized with 1% lidocaine, however this optimally proved unsuccessful.  Ultrasound scanning of the right upper abdominal quadrant was performed  to all of the delineate the anatomy and avoid transgression of the pleural. Note was made of marked dilatation of the gallbladder nearly obstructing access to the right lobe of the liver.  A spot along the right mid axillary line was marked fluoroscopically inferior to the right costophrenic angle.  A pass was made into the hepatic parenchyma with a 22 gauge Chiba needle after the overlying soft tissues were anesthetized 1% lidocaine with epinephrine however puncture of the  gallbladder was confirmed with injection of a small amount of contrast.  Despite the injection of additional contrast into the gallbladder, no contrast would pass beyond the level of the cystic duct.  Several additional passes were made into the posterior segment of the right lobe of the liver with eventual opacification of a nondilated peripheral biliary duct.  This allowed for access of this non dilated duct with an additional 22 gauge Chiba needle. Appropriate biliary puncture was confirmed with the advancement of a Nitrex wire centrally and dilatation of the tract with an Accustick set.  Several spot radiographic fluoroscopic images were obtained in various obliquities confirming appropriate access.  The Accustick catheter was exchanged for a Kumpe catheter over a [employer] wire.  Contrast injection was performed at the level of the distal CBD.   Limited attempts were made to pass the distal CBD obstruction with both a regular and stiff Glidewire, however this ultimately proved unsuccessful.  Over an Amplatz stiff wire, the tract was dilated and an 8 [place] biliary drainage catheter was advanced with coil ultimately locked within the distal CBD.  Contrast was injected and a completion radiograph was obtained.  The catheter was connected to a drainage bag which yielded the brisk return of dark bile.  The catheter was secured to the skin with an interrupted suture.  The patient tolerated the procedure well without immediate postprocedural complication.  Findings:  The preprocedural sonographic imaging failed to demonstrate significant dilatation of the peripheral biliary ducts as was demonstrated on preprocedural cross-sectional imaging.  Limited attempts to access the slightly atrophic nondilated left hepatic lobe ultimately proved unsuccessful.  Ultrasound scanning of the right lobe of the liver demonstrated marked distension of the gallbladder and during the initial attempt to access the right lobe of the liver,  the gallbladder was inadvertently punctured.  With the gallbladder now delineated, a peripheral right posterior nondilated duct was eventually opacified and ultimately cannulated.  Contrast injection from the distal CBD demonstrates complete obstruction at the level of the ampulla.  Limited attempts to traverse the obstruction proved unsuccessful.  As such, an 8 [place] percutaneous biliary drainage catheter was positioned with coil locked within the distal common bile duct.  Impression:  Successful placement of an 8 French percutaneous biliary drainage catheter with end coiled and locked within the obstructed distal CBD.  Plan:  The patient will return after at least 2 days of external biliary decompression for attempted traversal of the distal CBD obstruction and internalization of the biliary drain.  Above findings discussed with Dr. Evette Cristal at the time of procedure completion.   Original Report Authenticated By: Waynard Reeds, M.D.    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  Failed ERCP. Now with obstructive jaundice.  ULTRASOUND AND FLUOROSCOIC GUIDED PERCUTANEOUS TRANSHEPATIC CHOLANGIOGRAM AND BILIARY TUBE PLACEMENT  Comparisons: CT abdomen pelvis - 05/01/2012; abdominal MRCP - 05/05/2012  Intravenous Medications: Versed 10 mg IV; Fentanyl 300 mcg IV; Dilaudid 6 mg IV; The patient  is currently admitted to the hospital on intravenous antibiotics.  Intravenous antibiotics have been administered with an appropriate time frame prior to the initiation of the procedure.  Contrast: 50ml Omnipaque-300  Total Moderate Sedation Time: 90 minutes  Fluoroscopy Time: 28.1 minutes.  Complications: None immediate  Technique:  Informed written consent was obtained from the the patient's daughter after a discussion of the risks, benefits and alternatives to treatment.  Questions regarding the procedure were encouraged and answered.  A timeout was performed  prior to the initiation of the procedure.  The right upper abdominal quadrant was prepped and draped in the usual sterile fashion, and a sterile drape was applied covering the operative field.  Maximum barrier sterile technique with sterile gowns and gloves were used for the procedure.  A timeout was performed prior to the initiation of the procedure.  Ultrasound scanning of the left lobe of the liver failed to delineate dilatation of the peripheral left biliary ducts.  A nondilated biliary duct was attempted to be accessed percutaneously with a 21-gauge Chiba needle after overlying soft tissues were anesthestized with 1% lidocaine, however this optimally proved unsuccessful.  Ultrasound scanning of the right upper abdominal quadrant was performed to all of the delineate the anatomy and avoid transgression of the pleural. Note was made of marked dilatation of the gallbladder nearly obstructing access to the right lobe of the liver.  A spot along the right mid axillary line was marked fluoroscopically inferior to the right costophrenic angle.  A pass was made into the hepatic parenchyma with a 22 gauge Chiba needle after the overlying soft tissues were anesthetized 1% lidocaine with epinephrine however puncture of the gallbladder was confirmed with injection of a small amount of contrast.  Despite the injection of additional contrast into the gallbladder, no contrast would pass beyond the level of the cystic duct.  Several additional passes were made into the posterior segment of the right lobe of the liver with eventual opacification of a nondilated peripheral biliary duct.  This allowed for access of this non dilated duct with an additional 22 gauge Chiba needle. Appropriate biliary puncture was confirmed with the advancement of a Nitrex wire centrally and dilatation of the tract with an Accustick set.  Several spot radiographic fluoroscopic images were obtained in various obliquities confirming appropriate access.   The Accustick catheter was exchanged for a Kumpe catheter over a [employer] wire.  Contrast injection was performed at the level of the distal CBD.   Limited attempts were made to pass the distal CBD obstruction with both a regular and stiff Glidewire, however this ultimately proved unsuccessful.  Over an Amplatz stiff wire, the tract was dilated and an 8 [place] biliary drainage catheter was advanced with coil ultimately locked within the distal CBD.  Contrast was injected and a completion radiograph was obtained.  The catheter was connected to a drainage bag which yielded the brisk return of dark bile.  The catheter was secured to the skin with an interrupted suture.  The patient tolerated the procedure well without immediate postprocedural complication.  Findings:  The preprocedural sonographic imaging failed to demonstrate significant dilatation of the peripheral biliary ducts as was demonstrated on preprocedural cross-sectional imaging.  Limited attempts to access the slightly atrophic nondilated left hepatic lobe ultimately proved unsuccessful.  Ultrasound scanning of the right lobe of the liver demonstrated marked distension of the gallbladder and during the initial attempt to access the right lobe of the liver, the gallbladder was inadvertently punctured.  With  the gallbladder now delineated, a peripheral right posterior nondilated duct was eventually opacified and ultimately cannulated.  Contrast injection from the distal CBD demonstrates complete obstruction at the level of the ampulla.  Limited attempts to traverse the obstruction proved unsuccessful.  As such, an 8 [place] percutaneous biliary drainage catheter was positioned with coil locked within the distal common bile duct.  Impression:  Successful placement of an 8 French percutaneous biliary drainage catheter with end coiled and locked within the obstructed distal CBD.  Plan:  The patient will return after at least 2 days of external biliary  decompression for attempted traversal of the distal CBD obstruction and internalization of the biliary drain.  Above findings discussed with Dr. Evette Cristal at the time of procedure completion.   Original Report Authenticated By: Waynard Reeds, M.D.     Anti-infectives: Anti-infectives     Start     Dose/Rate Route Frequency Ordered Stop   05/04/12 1100  piperacillin-tazobactam (ZOSYN) IVPB 3.375 g       3.375 g 12.5 mL/hr over 240 Minutes Intravenous Every 8 hours 05/04/12 1013     05/04/12 1000   piperacillin-tazo (ZOSYN) NICU IV syringe 200 mg/mL  Status:  Discontinued        75 mg/kg  70.4 kg 52.8 mL/hr over 30 Minutes Intravenous Every 8 hours 05/04/12 0947 05/04/12 1011           Assessment/Plan  1. Duodenal obstruction, ? Secondary to a hepatobiliary carcinoma 2. Hyperbilirubinemia 3. transaminitis 4. PCM/TNA  Plan: 1. Await Dr. Arita Miss opinion of scans.  Patient is likely a candidate for a whipple procedure given the location of obstruction and possible lesion 2. Will start TNA today for nutritional support as he is unable to take anything oral right now. 3. Cont with drain. 4. Await biopsies.   LOS: 8 days    Hiawatha Dressel E 05/09/2012, 7:30 AM Pager: [phone]   ADDENDUM: Patient is a whipple candidate per Dr. Donell Beers.  He will need to be kept on TNA this weekend.  We will obtain a CT of the chest to rule out potential metastatic disease in the chest.  Dr. Donell Beers is the doctor of the week here at [facility] long next week.  Timing of surgery will be per her. Jamyson Jirak E 3:23 PM

## 2012-05-09 NOTE — Progress Notes (Signed)
Subjective: Pt ok. No new c/o this am  Objective: Physical Exam: BP 146/68  Pulse 67  Temp 98.5 F (36.9 C) (Oral)  Resp 18  Ht 5\' 6"  (1.676 m)  Wt 153 lb 14.1 oz (69.8 kg)  BMI 24.84 kg/m2  SpO2 91% Drain intact, site clean.  Draining golden bile  Labs: CBC  Basename 05/09/12 0440 05/08/12 0440  WBC 8.9 8.3  HGB 15.3 13.6  HCT 44.3 40.4  PLT 177 200   BMET  Basename 05/09/12 0440 05/08/12 0441  NA 132* 133*  K 3.2* 4.5  CL 97 103  CO2 21 18*  GLUCOSE 89 117*  BUN 4* 9  CREATININE 0.99 1.65*  CALCIUM 9.5 8.3*   LFT  Basename 05/09/12 0440 05/08/12 0440  PROT 7.2 --  ALBUMIN 3.2* --  AST 80* --  ALT 127* --  ALKPHOS 354* --  BILITOT 12.2* --  BILIDIR -- --  IBILI -- --  LIPASE -- 102*   PT/INR No results found for this basename: LABPROT:2,INR:2 in the last 72 hours   Studies/Results: Ir Biliary Drain Catheter Placement  05/07/2012  *RADIOLOGY REPORT*  Indication: 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  Failed ERCP. Now with obstructive jaundice.  ULTRASOUND AND FLUOROSCOIC GUIDED PERCUTANEOUS TRANSHEPATIC CHOLANGIOGRAM AND BILIARY TUBE PLACEMENT  Comparisons: CT abdomen pelvis - 05/01/2012; abdominal MRCP - 05/05/2012  Intravenous Medications: Versed 10 mg IV; Fentanyl 300 mcg IV; Dilaudid 6 mg IV; The patient is currently admitted to the hospital on intravenous antibiotics.  Intravenous antibiotics have been administered with an appropriate time frame prior to the initiation of the procedure.  Contrast: 50ml Omnipaque-300  Total Moderate Sedation Time: 90 minutes  Fluoroscopy Time: 28.1 minutes.  Complications: None immediate  Technique:  Informed written consent was obtained from the the patient's daughter after a discussion of the risks, benefits and alternatives to treatment.  Questions regarding the procedure were encouraged and answered.  A timeout was performed prior to the initiation of the procedure.  The right upper abdominal  quadrant was prepped and draped in the usual sterile fashion, and a sterile drape was applied covering the operative field.  Maximum barrier sterile technique with sterile gowns and gloves were used for the procedure.  A timeout was performed prior to the initiation of the procedure.  Ultrasound scanning of the left lobe of the liver failed to delineate dilatation of the peripheral left biliary ducts.  A nondilated biliary duct was attempted to be accessed percutaneously with a 21-gauge Chiba needle after overlying soft tissues were anesthestized with 1% lidocaine, however this optimally proved unsuccessful.  Ultrasound scanning of the right upper abdominal quadrant was performed to all of the delineate the anatomy and avoid transgression of the pleural. Note was made of marked dilatation of the gallbladder nearly obstructing access to the right lobe of the liver.  A spot along the right mid axillary line was marked fluoroscopically inferior to the right costophrenic angle.  A pass was made into the hepatic parenchyma with a 22 gauge Chiba needle after the overlying soft tissues were anesthetized 1% lidocaine with epinephrine however puncture of the gallbladder was confirmed with injection of a small amount of contrast.  Despite the injection of additional contrast into the gallbladder, no contrast would pass beyond the level of the cystic duct.  Several additional passes were made into the posterior segment of the right lobe of the liver with eventual opacification of a nondilated peripheral biliary duct.  This allowed for access  of this non dilated duct with an additional 22 gauge Chiba needle. Appropriate biliary puncture was confirmed with the advancement of a Nitrex wire centrally and dilatation of the tract with an Accustick set.  Several spot radiographic fluoroscopic images were obtained in various obliquities confirming appropriate access.  The Accustick catheter was exchanged for a Kumpe catheter over a  [employer] wire.  Contrast injection was performed at the level of the distal CBD.   Limited attempts were made to pass the distal CBD obstruction with both a regular and stiff Glidewire, however this ultimately proved unsuccessful.  Over an Amplatz stiff wire, the tract was dilated and an 8 [place] biliary drainage catheter was advanced with coil ultimately locked within the distal CBD.  Contrast was injected and a completion radiograph was obtained.  The catheter was connected to a drainage bag which yielded the brisk return of dark bile.  The catheter was secured to the skin with an interrupted suture.  The patient tolerated the procedure well without immediate postprocedural complication.  Findings:  The preprocedural sonographic imaging failed to demonstrate significant dilatation of the peripheral biliary ducts as was demonstrated on preprocedural cross-sectional imaging.  Limited attempts to access the slightly atrophic nondilated left hepatic lobe ultimately proved unsuccessful.  Ultrasound scanning of the right lobe of the liver demonstrated marked distension of the gallbladder and during the initial attempt to access the right lobe of the liver, the gallbladder was inadvertently punctured.  With the gallbladder now delineated, a peripheral right posterior nondilated duct was eventually opacified and ultimately cannulated.  Contrast injection from the distal CBD demonstrates complete obstruction at the level of the ampulla.  Limited attempts to traverse the obstruction proved unsuccessful.  As such, an 8 [place] percutaneous biliary drainage catheter was positioned with coil locked within the distal common bile duct.  Impression:  Successful placement of an 8 French percutaneous biliary drainage catheter with end coiled and locked within the obstructed distal CBD.  Plan:  The patient will return after at least 2 days of external biliary decompression for attempted traversal of the distal CBD obstruction and  internalization of the biliary drain.  Above findings discussed with Dr. Evette Cristal at the time of procedure completion.   Original Report Authenticated By: Waynard Reeds, M.D.    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  Failed ERCP. Now with obstructive jaundice.  ULTRASOUND AND FLUOROSCOIC GUIDED PERCUTANEOUS TRANSHEPATIC CHOLANGIOGRAM AND BILIARY TUBE PLACEMENT  Comparisons: CT abdomen pelvis - 05/01/2012; abdominal MRCP - 05/05/2012  Intravenous Medications: Versed 10 mg IV; Fentanyl 300 mcg IV; Dilaudid 6 mg IV; The patient is currently admitted to the hospital on intravenous antibiotics.  Intravenous antibiotics have been administered with an appropriate time frame prior to the initiation of the procedure.  Contrast: 50ml Omnipaque-300  Total Moderate Sedation Time: 90 minutes  Fluoroscopy Time: 28.1 minutes.  Complications: None immediate  Technique:  Informed written consent was obtained from the the patient's daughter after a discussion of the risks, benefits and alternatives to treatment.  Questions regarding the procedure were encouraged and answered.  A timeout was performed prior to the initiation of the procedure.  The right upper abdominal quadrant was prepped and draped in the usual sterile fashion, and a sterile drape was applied covering the operative field.  Maximum barrier sterile technique with sterile gowns and gloves were used for the procedure.  A timeout was performed prior to the initiation of the  procedure.  Ultrasound scanning of the left lobe of the liver failed to delineate dilatation of the peripheral left biliary ducts.  A nondilated biliary duct was attempted to be accessed percutaneously with a 21-gauge Chiba needle after overlying soft tissues were anesthestized with 1% lidocaine, however this optimally proved unsuccessful.  Ultrasound scanning of the right upper abdominal quadrant was performed to all of the  delineate the anatomy and avoid transgression of the pleural. Note was made of marked dilatation of the gallbladder nearly obstructing access to the right lobe of the liver.  A spot along the right mid axillary line was marked fluoroscopically inferior to the right costophrenic angle.  A pass was made into the hepatic parenchyma with a 22 gauge Chiba needle after the overlying soft tissues were anesthetized 1% lidocaine with epinephrine however puncture of the gallbladder was confirmed with injection of a small amount of contrast.  Despite the injection of additional contrast into the gallbladder, no contrast would pass beyond the level of the cystic duct.  Several additional passes were made into the posterior segment of the right lobe of the liver with eventual opacification of a nondilated peripheral biliary duct.  This allowed for access of this non dilated duct with an additional 22 gauge Chiba needle. Appropriate biliary puncture was confirmed with the advancement of a Nitrex wire centrally and dilatation of the tract with an Accustick set.  Several spot radiographic fluoroscopic images were obtained in various obliquities confirming appropriate access.  The Accustick catheter was exchanged for a Kumpe catheter over a [employer] wire.  Contrast injection was performed at the level of the distal CBD.   Limited attempts were made to pass the distal CBD obstruction with both a regular and stiff Glidewire, however this ultimately proved unsuccessful.  Over an Amplatz stiff wire, the tract was dilated and an 8 [place] biliary drainage catheter was advanced with coil ultimately locked within the distal CBD.  Contrast was injected and a completion radiograph was obtained.  The catheter was connected to a drainage bag which yielded the brisk return of dark bile.  The catheter was secured to the skin with an interrupted suture.  The patient tolerated the procedure well without immediate postprocedural complication.   Findings:  The preprocedural sonographic imaging failed to demonstrate significant dilatation of the peripheral biliary ducts as was demonstrated on preprocedural cross-sectional imaging.  Limited attempts to access the slightly atrophic nondilated left hepatic lobe ultimately proved unsuccessful.  Ultrasound scanning of the right lobe of the liver demonstrated marked distension of the gallbladder and during the initial attempt to access the right lobe of the liver, the gallbladder was inadvertently punctured.  With the gallbladder now delineated, a peripheral right posterior nondilated duct was eventually opacified and ultimately cannulated.  Contrast injection from the distal CBD demonstrates complete obstruction at the level of the ampulla.  Limited attempts to traverse the obstruction proved unsuccessful.  As such, an 8 [place] percutaneous biliary drainage catheter was positioned with coil locked within the distal common bile duct.  Impression:  Successful placement of an 8 French percutaneous biliary drainage catheter with end coiled and locked within the obstructed distal CBD.  Plan:  The patient will return after at least 2 days of external biliary decompression for attempted traversal of the distal CBD obstruction and internalization of the biliary drain.  Above findings discussed with Dr. Evette Cristal at the time of procedure completion.   Original Report Authenticated By: Judene Companion.D.  Assessment/Plan: Biliary obstruction with rising bilirubin, to 12.2 today Will bring back to IR sometime today for repeat cholangiogram, attempt to across obstruction, but likely exchange and upsize external biliary drain. Discussed with pt and family risks. Keep NPO Consent obtained.   LOS: 8 days    Brayton El PA-C 05/09/2012 8:17 AM

## 2012-05-09 NOTE — Progress Notes (Signed)
PARENTERAL NUTRITION CONSULT NOTE - INITIAL  Pharmacy Consult for TNA Indication: pancreatitis, duodenal obstruction  No Known Allergies  Patient Measurements: Height: 5\' 6"  (167.6 cm) (pt. stated) Weight: 153 lb 14.1 oz (69.8 kg) (bed scale) IBW/kg (Calculated) : 63.8   Vital Signs: Temp: 98.5 F (36.9 C) (09/20 0600) Temp src: Oral (09/20 0600) BP: 146/68 mmHg (09/20 0600) Pulse Rate: 67  (09/20 0600) Intake/Output from previous day: 09/19 0701 - 09/20 0700 In: 3091.7 [P.O.:700; I.V.:1941.7; IV Piggyback:450] Out: 500 [Drains:500] Intake/Output from this shift:    Labs:  [facility] 05/09/12 0440 05/08/12 0440  WBC 8.9 8.3  HGB 15.3 13.6  HCT 44.3 40.4  PLT 177 200  APTT -- --  INR -- --     Basename 05/09/12 0440 05/08/12 0441  NA 132* 133*  K 3.2* 4.5  CL 97 103  CO2 21 18*  GLUCOSE 89 117*  BUN 4* 9  CREATININE 0.99 1.65*  LABCREA -- --  CREAT24HRUR -- --  CALCIUM 9.5 8.3*  MG 2.0 --  PHOS 2.9 --  PROT 7.2 5.7*  ALBUMIN 3.2* 2.4*  AST 80* 90*  ALT 127* 131*  ALKPHOS 354* 273*  BILITOT 12.2* 10.6*  BILIDIR -- --  IBILI -- --  PREALBUMIN -- --  TRIG 422* --  CHOLHDL -- --  CHOL 189 --   Estimated Creatinine Clearance: 67.1 ml/min (by C-G formula based on Cr of 0.99).    Basename 05/07/12 2235  GLUCAP 112*   Insulin Requirements in the past 24 hours:  none  Nutritional Goals:  Kcal:1700-1950  Protein:70-85g  Fluid:1.7-1.9L  Current Nutrition:  NPO IVF: D5-1/2NS @ 100 ml/hr  Clinimix E 5/20 advanced to goal rate of 70 ml/hr + lipids at 10 ml/hr will provide: 84 g/day protein and 1958 Kcal/day MWF, 1478 Kcal/day STTHS (Avg. 1684 Kcal/day weekly).  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  Biopsies obtained, pathology pending.  Surgery to f/u with decision to proceed with further surgery. PICC line ordered for placement today prior to  initiation of TNA.  Labs:  Electrolytes:  K+ 3.2 (will replace today prior to initiation of TNA), Na 132 (cannot adjust in pre-mixed TNA), Mag/Phos WNL LFTs: elevated, but may be trending down now, monitoring and will try to restrict amount of dextrose kcal until see improvement (will use Clinimix E 5/15 instead of E 5/20) SCr: now WNL TGs/Cholesterol: TG 422 today - f/u to see when we can start lipids (hold for now) Pre-albumin: pending  Plan:  At 1800 today:  Start Clinimix E 5/15 at 40 ml/hr  Holding off on providing lipids at this time d/t high TGs  TNA to contain standard multivitamins and trace elements (MWF only due to ongoing shortage).  KCl 10 mEq/100 mL IV x 4 runs.  Reduce IVF to 60 ml/hr.  Start sensitive SSI q6h.  TNA lab panels on Mondays & Thursdays.  F/u CMET in AM.  Will also order TGs for AM to follow as well.  Clance Boll 05/09/2012,8:08 AM

## 2012-05-09 NOTE — Progress Notes (Signed)
Nutrition Brief Note  Intervention: TPN per pharmacy. Will monitor.   Diet: NPO  TPN: Clinimix E 5/15 @ 40 ml/hr.  Provides 682 kcal and 48 grams protein daily.  Meets 40% minimum estimated kcal and 68% minimum estimated protein needs.  Additional IVF with D5 NS @ 85 ml/hr.  - Pt started TNA today. Lipids held r/t high triglycerides (422 mg/dL today). Potassium was low today but being replaced. Phosphorus and magnesium were WNL. Lipase much improved from 1311 U/L on 9/16 to 102 U/L yesterday.    Potassium  Date/Time Value Range Status  05/09/2012  4:40 AM 3.2* 3.5 - 5.1 mEq/L Final     RESULT REPEATED AND VERIFIED     DELTA CHECK NOTED  05/08/2012  4:41 AM 4.5  3.5 - 5.1 mEq/L Final  05/05/2012  5:43 AM 3.8  3.5 - 5.1 mEq/L Final    Phosphorus  Date/Time Value Range Status  05/09/2012  4:40 AM 2.9  2.3 - 4.6 mg/dL Final    Magnesium  Date/Time Value Range Status  05/09/2012  4:40 AM 2.0  1.5 - 2.5 mg/dL Final   CMP     Component Value Date/Time   NA 132* 05/09/2012 0440   K 3.2* 05/09/2012 0440   CL 97 05/09/2012 0440   CO2 21 05/09/2012 0440   GLUCOSE 89 05/09/2012 0440   BUN 4* 05/09/2012 0440   CREATININE 0.99 05/09/2012 0440   CALCIUM 9.5 05/09/2012 0440   PROT 7.2 05/09/2012 0440   ALBUMIN 3.2* 05/09/2012 0440   AST 80* 05/09/2012 0440   ALT 127* 05/09/2012 0440   ALKPHOS 354* 05/09/2012 0440   BILITOT 12.2* 05/09/2012 0440   GFRNONAA 84* 05/09/2012 0440   GFRAA >90 05/09/2012 0440   Lipase  Date Value Range Status  05/08/2012 102* 11 - 59 U/L Final    Levon Hedger MS, RD, LDN [phone] Pager [phone] After Hours Pager

## 2012-05-10 LAB — COMPREHENSIVE METABOLIC PANEL
ALT: 95 U/L — ABNORMAL HIGH (ref 0–53)
Alkaline Phosphatase: 337 U/L — ABNORMAL HIGH (ref 39–117)
BUN: 6 mg/dL (ref 6–23)
CO2: 21 mEq/L (ref 19–32)
Chloride: 100 mEq/L (ref 96–112)
GFR calc Af Amer: >90 mL/min (ref >90)
GFR calc non Af Amer: 85 mL/min — ABNORMAL LOW (ref >90)
Glucose, Bld: 120 mg/dL — ABNORMAL HIGH (ref 70–99)
Potassium: 3.7 mEq/L (ref 3.5–5.1)
Sodium: 134 mEq/L — ABNORMAL LOW (ref 135–145)
Total Bilirubin: 11.4 mg/dL — ABNORMAL HIGH (ref 0.3–1.2)
Total Protein: 6.7 g/dL (ref 6.0–8.3)

## 2012-05-10 LAB — GLUCOSE, CAPILLARY
Glucose-Capillary: 125 mg/dL — ABNORMAL HIGH (ref 70–99)
Glucose-Capillary: 127 mg/dL — ABNORMAL HIGH (ref 70–99)
Glucose-Capillary: 131 mg/dL — ABNORMAL HIGH (ref 70–99)

## 2012-05-10 LAB — TRIGLYCERIDES: Triglycerides: 385 mg/dL — ABNORMAL HIGH (ref <150)

## 2012-05-10 MED ORDER — PANTOPRAZOLE SODIUM 40 MG IV SOLR
40.0000 mg | Freq: Every day | INTRAVENOUS | Status: DC
  Administered 2012-05-11 – 2012-05-23 (×13): 40 mg via INTRAVENOUS
  Filled 2012-05-10 (×13): qty 40

## 2012-05-10 MED ORDER — KCL IN DEXTROSE-NACL 20-5-0.9 MEQ/L-%-% IV SOLN
INTRAVENOUS | Status: AC
  Administered 2012-05-10: 18:00:00 via INTRAVENOUS
  Filled 2012-05-10 (×2): qty 1000

## 2012-05-10 MED ORDER — MORPHINE SULFATE 2 MG/ML IJ SOLN
2.0000 mg | INTRAMUSCULAR | Status: DC | PRN
  Administered 2012-05-10 – 2012-05-15 (×22): 2 mg via INTRAVENOUS
  Filled 2012-05-10 (×22): qty 1

## 2012-05-10 MED ORDER — CLINIMIX E/DEXTROSE (5/15) 5 % IV SOLN
INTRAVENOUS | Status: AC
  Administered 2012-05-10: 17:00:00 via INTRAVENOUS
  Filled 2012-05-10: qty 2000

## 2012-05-10 NOTE — Progress Notes (Signed)
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 Arita Miss opinion regarding be a Whipple procedure or double bypass and if he is not a surgical candidate please call us back to discuss possible duodenal stent and will await biopsies in the meantime and please call us sooner if we could be of any further assistance  [facility] E

## 2012-05-10 NOTE — Progress Notes (Signed)
3 Days Post-Op  Subjective: Bili drain placed 9/18; up sized 9/20 Pt feeling some better  Objective: Vital signs in last 24 hours: Temp:  [97.5 F (36.4 C)-98.8 F (37.1 C)] 98.8 F (37.1 C) (09/21 1026) Pulse Rate:  [58-83] 67  (09/21 1026) Resp:  [13-28] 18  (09/21 1026) BP: (113-209)/(64-102) 144/74 mmHg (09/21 1026) SpO2:  [92 %-97 %] 94 % (09/21 1026) Weight:  [158 lb 4.6 oz (71.8 kg)] 158 lb 4.6 oz (71.8 kg) (09/21 0500) Last BM Date: 05/05/12  Intake/Output from previous day: 09/20 0701 - 09/21 0700 In: 1585.7 [I.V.:1535.7; IV Piggyback:50] Out: 475 [Drains:475] Intake/Output this shift: Total I/O In: 572 [TPN:572] Out: 300 [Drains:300]  PE:  Afeb; vss TB: 11.4 (12.2) LFTs down 475 cc output 9/20 300 cc so far 9/21: 200 cc in bag now - greenish purulent fluid Site clean and dry; NT   Lab Results:   [facility] 05/09/12 0440 05/08/12 0440  WBC 8.9 8.3  HGB 15.3 13.6  HCT 44.3 40.4  PLT 177 200   BMET  Basename 05/10/12 0411 05/09/12 0440  NA 134* 132*  K 3.7 3.2*  CL 100 97  CO2 21 21  GLUCOSE 120* 89  BUN 6 4*  CREATININE 0.95 0.99  CALCIUM 9.0 9.5   PT/INR No results found for this basename: LABPROT:2,INR:2 in the last 72 hours ABG No results found for this basename: PHART:2,PCO2:2,PO2:2,HCO3:2 in the last 72 hours  Studies/Results: Ct Chest W Contrast  05/09/2012  *RADIOLOGY REPORT*  Clinical Data: Rule out metastatic disease. Recent history of biliary obstruction due to a mass.  Jaundice and epigastric pain.  CT CHEST WITH CONTRAST  Technique:  Multidetector CT imaging of the chest was performed following the standard protocol during bolus administration of intravenous contrast.  Contrast: 80mL OMNIPAQUE IOHEXOL 300 MG/ML  SOLN  Comparison: No priors.  Findings:  Mediastinum: Heart size is mildly enlarged. There is no significant pericardial fluid, thickening or pericardial calcification. There is atherosclerosis of the thoracic aorta, the great  vessels of the mediastinum and the coronary arteries, including calcified atherosclerotic plaque in the left main, left anterior descending and right coronary arteries. Probable coronary artery stents in the proximal left anterior descending and distal right coronary arteries.  There is a 1.4 x 2.0 cm subcarinal node or a conglomerate of lymph nodes.  No other pathologically enlarged mediastinal or hilar lymph nodes are noted.  Esophagus is unremarkable in appearance.  Lungs/Pleura: No definite suspicious appearing pulmonary nodules or masses are identified on today's examination.  There is extensive air space consolidation and some volume loss throughout the lower lobes of the lungs bilaterally (right greater than left), concerning for pneumonia or sequelae of aspiration.  Trace right pleural effusion layering dependently.  Upper Abdomen: Biliary stents in place leading from the right hepatic ducts into the common bile duct (incompletely visualized). High attenuation material within the gallbladder from recent percutaneous cholangiogram.  Small amount of pneumobilia. Potential filling defects in the left branch of the portal vein, which is suspicious for incomplete portal vein thrombosis (image 47 of series 2).  Musculoskeletal: There are no aggressive appearing lytic or blastic lesions noted in the visualized portions of the skeleton.  IMPRESSION: 1.  No definite findings to suggest metastatic disease to the chest. 2.  There is an enlarged subcarinal lymph node or cluster of small subcarinal lymph nodes, as above, which may simply be reactive. Attention on follow-up studies may be warranted. 3.  Bibasilar air space consolidation and  some degree of bilateral lower lobe atelectasis (right greater than left), concerning for multilobar pneumonia and/or sequelae of aspiration. 3. 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 4.  Atherosclerosis,  including left main and two-vessel coronary artery disease.  Probable coronary artery stents in the proximal left anterior descending and distal right coronary artery territories. 5. Post procedural changes in the upper abdomen, as above.   Original Report Authenticated By: Florencia Reasons, M.D.    Ir Cholan Exist Tube  05/09/2012  *RADIOLOGY REPORT*  IR CHOLANGIOGRAM; IR INTERNALIZATION OF EXTERNAL BILIARY DRAIN  Date: 05/09/2012  Clinical History: 66 year old male with distal biliary obstruction and obstructive jaundice.  An externalized percutaneous transhepatic biliary drain was placed in the distal bile duct above the obstruction, however the patient is bilirubin continues to rise.  He presents to interventional radiology for cholangiogram followed by possible internalization of a biliary drain if the obstructing lesion cannot be crossed versus upsizing of the existing biliary drain which is not.  The adequately draining the system.  Procedures Performed: 1. Sheath cholangiogram 2.  Successful crossing of the distal biliary obstruction 3.  Placement of a 10 French internal/external transhepatic biliary drain  Interventional Radiologist:  Sterling Big, MD  Sedation: Moderate (conscious) sedation was used.  Two mg Versed, 100 mcg Fentanyl and 2 mg Dilaudid were administered intravenously. The patient's vital signs were monitored continuously by radiology nursing throughout the procedure.  Sedation Time: 25 minutes  Fluoroscopy time: 12.2  Contrast volume: 30 ml Omnipaque-300 administered into the biliary system  PROCEDURE/FINDINGS:   Informed consent was obtained from the patient following explanation of the procedure, risks, benefits and alternatives. The patient understands, agrees and consents for the procedure. All questions were addressed. A time out was performed.  Maximal barrier sterile technique utilized including caps, mask, sterile gowns, sterile gloves, large sterile drape, hand hygiene,  and betadine skin prep.  The existing external 8-French biliary drain was transected and a wire advanced into the distal common bile duct.  The drain was then removed over the wire.  A 9-French vascular sheath was advanced over a wire and positioned in the central right posterior hepatic duct.  A formal cholangiogram was then performed by injecting contrast material through the sheath.  The cholangiogram was obtained in multiple oblique images.  There is mild intra and extrahepatic biliary ductal dilatation with complete obstruction of the distal common bile duct.  The cystic duct is patent.  Both the left, and right biliary tree communicate, no amputated ducts or evidence of undrained hepatic segments.  No cholangiogram findings to suggest primary biliary cirrhosis, or other cholangiopathy.  An angled catheter was then advanced over the wire and using an angled Glidewire, the distal biliary obstruction was successfully crossed.  A superstiff Amplatz wire was then advanced into the proximal small bowel.  A 10-French Cook biliary drainage catheter was then advanced over the wire and the locking loop was positioned in the proximal duodenum. A final hand injection of contrast material through the tube confirmed tube replacement, as well as internal/external drainage.  The catheter was secured with 0-prolene suture and connected to gravity bag drainage.  The patient tolerated the procedure well, there is no immediate complication.  IMPRESSION:  1.  Percutaneous transhepatic cholangiogram demonstrates excellent communication between the left and right intrahepatic biliary systems, as well as patency of the cystic duct.  There is a complete obstruction of the distal common bile duct  just proximal to the ampulla of Vater.  There is mild - moderate intra and extrahepatic biliary ductal dilatation.  2. Successful crossing of the distal biliary obstruction followed by placement of a 10 French internal/external transhepatic  biliary drain.  The drain should be left to external drainage until the serum bilirubin has sufficiently decreased.  Once it is clear that the drainage is adequate, the tube can be capped and the bile allowed to drain through the tube into the duodenum.  3.  If the biopsy results come back as malignant, the cholangiogram findings suggest that the patient would be a candidate for placement of a metallic biliary stent via a transhepatic approach if they are not a surgical candidate. If the obstruction is benign, we could consider serial cholangioplasty.  These results were called by telephone on 05/09/2012 at 04:00 p.m. to Dr. Evette Cristal, who verbally acknowledged these results.  Signed,  Sterling Big, MD Vascular & Interventional Radiologist [facility]   Original Report Authenticated By: Vilma Prader    Ir Biliary Drain Catheter Placement  05/09/2012  *RADIOLOGY REPORT*  IR CHOLANGIOGRAM; IR INTERNALIZATION OF EXTERNAL BILIARY DRAIN  Date: 05/09/2012  Clinical History: 66 year old male with distal biliary obstruction and obstructive jaundice.  An externalized percutaneous transhepatic biliary drain was placed in the distal bile duct above the obstruction, however the patient is bilirubin continues to rise.  He presents to interventional radiology for cholangiogram followed by possible internalization of a biliary drain if the obstructing lesion cannot be crossed versus upsizing of the existing biliary drain which is not.  The adequately draining the system.  Procedures Performed: 1. Sheath cholangiogram 2.  Successful crossing of the distal biliary obstruction 3.  Placement of a 10 French internal/external transhepatic biliary drain  Interventional Radiologist:  Sterling Big, MD  Sedation: Moderate (conscious) sedation was used.  Two mg Versed, 100 mcg Fentanyl and 2 mg Dilaudid were administered intravenously. The patient's vital signs were monitored continuously by radiology nursing throughout the  procedure.  Sedation Time: 25 minutes  Fluoroscopy time: 12.2  Contrast volume: 30 ml Omnipaque-300 administered into the biliary system  PROCEDURE/FINDINGS:   Informed consent was obtained from the patient following explanation of the procedure, risks, benefits and alternatives. The patient understands, agrees and consents for the procedure. All questions were addressed. A time out was performed.  Maximal barrier sterile technique utilized including caps, mask, sterile gowns, sterile gloves, large sterile drape, hand hygiene, and betadine skin prep.  The existing external 8-French biliary drain was transected and a wire advanced into the distal common bile duct.  The drain was then removed over the wire.  A 9-French vascular sheath was advanced over a wire and positioned in the central right posterior hepatic duct.  A formal cholangiogram was then performed by injecting contrast material through the sheath.  The cholangiogram was obtained in multiple oblique images.  There is mild intra and extrahepatic biliary ductal dilatation with complete obstruction of the distal common bile duct.  The cystic duct is patent.  Both the left, and right biliary tree communicate, no amputated ducts or evidence of undrained hepatic segments.  No cholangiogram findings to suggest primary biliary cirrhosis, or other cholangiopathy.  An angled catheter was then advanced over the wire and using an angled Glidewire, the distal biliary obstruction was successfully crossed.  A superstiff Amplatz wire was then advanced into the proximal small bowel.  A 10-French Cook biliary drainage catheter was then advanced over the wire and the locking loop  was positioned in the proximal duodenum. A final hand injection of contrast material through the tube confirmed tube replacement, as well as internal/external drainage.  The catheter was secured with 0-prolene suture and connected to gravity bag drainage.  The patient tolerated the procedure well,  there is no immediate complication.  IMPRESSION:  1.  Percutaneous transhepatic cholangiogram demonstrates excellent communication between the left and right intrahepatic biliary systems, as well as patency of the cystic duct.  There is a complete obstruction of the distal common bile duct just proximal to the ampulla of Vater.  There is mild - moderate intra and extrahepatic biliary ductal dilatation.  2. Successful crossing of the distal biliary obstruction followed by placement of a 10 French internal/external transhepatic biliary drain.  The drain should be left to external drainage until the serum bilirubin has sufficiently decreased.  Once it is clear that the drainage is adequate, the tube can be capped and the bile allowed to drain through the tube into the duodenum.  3.  If the biopsy results come back as malignant, the cholangiogram findings suggest that the patient would be a candidate for placement of a metallic biliary stent via a transhepatic approach if they are not a surgical candidate. If the obstruction is benign, we could consider serial cholangioplasty.  These results were called by telephone on 05/09/2012 at 04:00 p.m. to Dr. Evette Cristal, who verbally acknowledged these results.  Signed,  Sterling Big, MD Vascular & Interventional Radiologist [facility]iology   Original Report Authenticated By: Vilma Prader     Anti-infectives: Anti-infectives     Start     Dose/Rate Route Frequency Ordered Stop   05/04/12 1100   piperacillin-tazobactam (ZOSYN) IVPB 3.375 g        3.375 g 12.5 mL/hr over 240 Minutes Intravenous Every 8 hours 05/04/12 1013     05/04/12 1000   piperacillin-tazo (ZOSYN) NICU IV syringe 200 mg/mL  Status:  Discontinued        75 mg/kg  70.4 kg 52.8 mL/hr over 30 Minutes Intravenous Every 8 hours 05/04/12 0947 05/04/12 1011          Assessment/Plan: s/p Procedure(s) (LRB) with comments: ENDOSCOPIC RETROGRADE CHOLANGIOPANCREATOGRAPHY (ERCP) (N/A) - Dr  [name] UPPER ENDOSCOPIC ULTRASOUND (EUS) RADIAL ()   LOS: 9 days   Bili drain intact Output great Feels better Plan per [facility]  Colin Castro A 05/10/2012

## 2012-05-10 NOTE — Progress Notes (Signed)
PARENTERAL NUTRITION CONSULT NOTE - INITIAL  Pharmacy Consult for TNA Indication: pancreatitis, duodenal obstruction  No Known Allergies  Patient Measurements: Height: 5\' 6"  (167.6 cm) (pt. stated) Weight: 158 lb 4.6 oz (71.8 kg) IBW/kg (Calculated) : 63.8   Vital Signs: Temp: 98.2 F (36.8 C) (09/21 0553) BP: 131/64 mmHg (09/21 0553) Pulse Rate: 70  (09/21 0553) Intake/Output from previous day: 09/20 0701 - 09/21 0700 In: 1585.7 [I.V.:1535.7; IV Piggyback:50] Out: 475 [Drains:475] Intake/Output from this shift: Total I/O In: 572 [TPN:572] Out: 300 [Drains:300]  Labs:  [facility] 05/09/12 0440 05/08/12 0440  WBC 8.9 8.3  HGB 15.3 13.6  HCT 44.3 40.4  PLT 177 200  APTT -- --  INR -- --     Basename 05/10/12 0411 05/09/12 0440 05/08/12 0441  NA 134* 132* 133*  K 3.7 3.2* 4.5  CL 100 97 103  CO2 21 21 18*  GLUCOSE 120* 89 117*  BUN 6 4* 9  CREATININE 0.95 0.99 1.65*  LABCREA -- -- --  CREAT24HRUR -- -- --  CALCIUM 9.0 9.5 8.3*  MG -- 2.0 --  PHOS -- 2.9 --  PROT 6.7 7.2 5.7*  ALBUMIN 2.8* 3.2* 2.4*  AST 55* 80* 90*  ALT 95* 127* 131*  ALKPHOS 337* 354* 273*  BILITOT 11.4* 12.2* 10.6*  BILIDIR -- -- --  IBILI -- -- --  PREALBUMIN -- 12.6* --  TRIG -- 422* --  CHOLHDL -- -- --  CHOL -- 189 --   Estimated Creatinine Clearance: 70 ml/min (by C-G formula based on Cr of 0.95).    Basename 05/10/12 0549 05/10/12 0023 05/09/12 1827  GLUCAP 127* 125* 131*   Insulin Requirements in the past 24 hours:   2 units  Nutritional Goals:  Kcal:1700-1950  Protein:70-85g  Fluid:1.7-1.9L  Current Nutrition:  NPO IVF: D5-1/2NS @ 85 ml/hr  Clinimix E 5/20 advanced to goal rate of 70 ml/hr + lipids at 10 ml/hr will provide: 84 g/day protein and 1958 Kcal/day MWF, 1478 Kcal/day STTHS (Avg. 1684 Kcal/day weekly).  Assessment: 66 YOM presented 9/12 w/ abdominal tightness and nausea. 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 Pathology pending. Had  biliary drain x 2 /18 and 9/20. Surgery planned next week  Electrolytes:  K+ up to wnl after replacement yesterday. Na remains low, cannot adjust in pre-mixed TNA. Other lytes WNL LFTs: elevated, trending down s/p 2nd biliary drain 9/20.  SCr:  WNL TGs/Cholesterol: TG 422 9/20, no lipids yesterday; TG today in process Pre-albumin: 12.6 9/20  Plan:   Increase TNA to 60 ml/hr, decrease MIVF to 65 ml/hr  Continue to monitor LFT, restrict amount of CHO kcal (use Clinimix E 5/15 instead of E 5/20)  F/U TG on Monday - Lipids MWF only due to shortage if appropriate  TNA to contain standard multivitamins and trace elements (MWF only due to ongoing shortage).  Continue sensitive SSI q6h.  CMET in AM  TNA lab panels on Mondays & Thursdays.  Gwen Her PharmD  [phone] 05/10/2012 8:39 AM

## 2012-05-10 NOTE — Progress Notes (Signed)
Patient ID: Colin Castro, male   DOB: 1946-05-17, 66 y.o.   MRN: [id number] 3 Days Post-Op  Subjective: No C/O x itching  Objective: Vital signs in last 24 hours: Temp:  [97.5 F (36.4 C)-98.8 F (37.1 C)] 98.8 F (37.1 C) (09/21 1026) Pulse Rate:  [58-83] 67  (09/21 1026) Resp:  [13-28] 18  (09/21 1026) BP: (113-209)/(64-102) 144/74 mmHg (09/21 1026) SpO2:  [92 %-97 %] 94 % (09/21 1026) Weight:  [158 lb 4.6 oz (71.8 kg)] 158 lb 4.6 oz (71.8 kg) (09/21 0500) Last BM Date: 05/05/12  Intake/Output from previous day: 09/20 0701 - 09/21 0700 In: 1585.7 [I.V.:1535.7; IV Piggyback:50] Out: 475 [Drains:475] Intake/Output this shift: Total I/O In: 572 [TPN:572] Out: 300 [Drains:300]  General appearance: alert and no distress Eyes: positive findings: sclera icteric GI: nontender, drain with clear bile  Lab Results:   Basename 05/09/12 0440 05/08/12 0440  WBC 8.9 8.3  HGB 15.3 13.6  HCT 44.3 40.4  PLT 177 200   BMET  Basename 05/10/12 0411 05/09/12 0440  NA 134* 132*  K 3.7 3.2*  CL 100 97  CO2 21 21  GLUCOSE 120* 89  BUN 6 4*  CREATININE 0.95 0.99  CALCIUM 9.0 9.5   Lab Results  Component Value Date   ALT 95* 05/10/2012   AST 55* 05/10/2012   ALKPHOS 337* 05/10/2012   BILITOT 11.4* 05/10/2012      Studies/Results: Ct Chest W Contrast  05/09/2012  *RADIOLOGY REPORT*  Clinical Data: Rule out metastatic disease. Recent history of biliary obstruction due to a mass.  Jaundice and epigastric pain.  CT CHEST WITH CONTRAST  Technique:  Multidetector CT imaging of the chest was performed following the standard protocol during bolus administration of intravenous contrast.  Contrast: 80mL OMNIPAQUE IOHEXOL 300 MG/ML  SOLN  Comparison: No priors.  Findings:  Mediastinum: Heart size is mildly enlarged. There is no significant pericardial fluid, thickening or pericardial calcification. There is atherosclerosis of the thoracic aorta, the great vessels of the mediastinum and the  coronary arteries, including calcified atherosclerotic plaque in the left main, left anterior descending and right coronary arteries. Probable coronary artery stents in the proximal left anterior descending and distal right coronary arteries.  There is a 1.4 x 2.0 cm subcarinal node or a conglomerate of lymph nodes.  No other pathologically enlarged mediastinal or hilar lymph nodes are noted.  Esophagus is unremarkable in appearance.  Lungs/Pleura: No definite suspicious appearing pulmonary nodules or masses are identified on today's examination.  There is extensive air space consolidation and some volume loss throughout the lower lobes of the lungs bilaterally (right greater than left), concerning for pneumonia or sequelae of aspiration.  Trace right pleural effusion layering dependently.  Upper Abdomen: Biliary stents in place leading from the right hepatic ducts into the common bile duct (incompletely visualized). High attenuation material within the gallbladder from recent percutaneous cholangiogram.  Small amount of pneumobilia. Potential filling defects in the left branch of the portal vein, which is suspicious for incomplete portal vein thrombosis (image 47 of series 2).  Musculoskeletal: There are no aggressive appearing lytic or blastic lesions noted in the visualized portions of the skeleton.  IMPRESSION: 1.  No definite findings to suggest metastatic disease to the chest. 2.  There is an enlarged subcarinal lymph node or cluster of small subcarinal lymph nodes, as above, which may simply be reactive. Attention on follow-up studies may be warranted. 3.  Bibasilar air space consolidation and some degree of bilateral  lower lobe atelectasis (right greater than left), concerning for multilobar pneumonia and/or sequelae of aspiration. 3. 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 4.  Atherosclerosis, including left main and two-vessel  coronary artery disease.  Probable coronary artery stents in the proximal left anterior descending and distal right coronary artery territories. 5. Post procedural changes in the upper abdomen, as above.   Original Report Authenticated By: Florencia Reasons, M.D.    Ir Cholan Exist Tube  05/09/2012  *RADIOLOGY REPORT*  IR CHOLANGIOGRAM; IR INTERNALIZATION OF EXTERNAL BILIARY DRAIN  Date: 05/09/2012  Clinical History: 66 year old male with distal biliary obstruction and obstructive jaundice.  An externalized percutaneous transhepatic biliary drain was placed in the distal bile duct above the obstruction, however the patient is bilirubin continues to rise.  He presents to interventional radiology for cholangiogram followed by possible internalization of a biliary drain if the obstructing lesion cannot be crossed versus upsizing of the existing biliary drain which is not.  The adequately draining the system.  Procedures Performed: 1. Sheath cholangiogram 2.  Successful crossing of the distal biliary obstruction 3.  Placement of a 10 French internal/external transhepatic biliary drain  Interventional Radiologist:  Sterling Big, MD  Sedation: Moderate (conscious) sedation was used.  Two mg Versed, 100 mcg Fentanyl and 2 mg Dilaudid were administered intravenously. The patient's vital signs were monitored continuously by radiology nursing throughout the procedure.  Sedation Time: 25 minutes  Fluoroscopy time: 12.2  Contrast volume: 30 ml Omnipaque-300 administered into the biliary system  PROCEDURE/FINDINGS:   Informed consent was obtained from the patient following explanation of the procedure, risks, benefits and alternatives. The patient understands, agrees and consents for the procedure. All questions were addressed. A time out was performed.  Maximal barrier sterile technique utilized including caps, mask, sterile gowns, sterile gloves, large sterile drape, hand hygiene, and betadine skin prep.  The  existing external 8-French biliary drain was transected and a wire advanced into the distal common bile duct.  The drain was then removed over the wire.  A 9-French vascular sheath was advanced over a wire and positioned in the central right posterior hepatic duct.  A formal cholangiogram was then performed by injecting contrast material through the sheath.  The cholangiogram was obtained in multiple oblique images.  There is mild intra and extrahepatic biliary ductal dilatation with complete obstruction of the distal common bile duct.  The cystic duct is patent.  Both the left, and right biliary tree communicate, no amputated ducts or evidence of undrained hepatic segments.  No cholangiogram findings to suggest primary biliary cirrhosis, or other cholangiopathy.  An angled catheter was then advanced over the wire and using an angled Glidewire, the distal biliary obstruction was successfully crossed.  A superstiff Amplatz wire was then advanced into the proximal small bowel.  A 10-French Cook biliary drainage catheter was then advanced over the wire and the locking loop was positioned in the proximal duodenum. A final hand injection of contrast material through the tube confirmed tube replacement, as well as internal/external drainage.  The catheter was secured with 0-prolene suture and connected to gravity bag drainage.  The patient tolerated the procedure well, there is no immediate complication.  IMPRESSION:  1.  Percutaneous transhepatic cholangiogram demonstrates excellent communication between the left and right intrahepatic biliary systems, as well as patency of the cystic duct.  There is a complete obstruction of the distal common bile duct just proximal to the  ampulla of Vater.  There is mild - moderate intra and extrahepatic biliary ductal dilatation.  2. Successful crossing of the distal biliary obstruction followed by placement of a 10 French internal/external transhepatic biliary drain.  The drain  should be left to external drainage until the serum bilirubin has sufficiently decreased.  Once it is clear that the drainage is adequate, the tube can be capped and the bile allowed to drain through the tube into the duodenum.  3.  If the biopsy results come back as malignant, the cholangiogram findings suggest that the patient would be a candidate for placement of a metallic biliary stent via a transhepatic approach if they are not a surgical candidate. If the obstruction is benign, we could consider serial cholangioplasty.  These results were called by telephone on 05/09/2012 at 04:00 p.m. to Dr. Evette Cristal, who verbally acknowledged these results.  Signed,  Sterling Big, MD Vascular & Interventional Radiologist [facility] Radiology   Original Report Authenticated By: Vilma Prader    Ir Biliary Drain Catheter Placement  05/09/2012  *RADIOLOGY REPORT*  IR CHOLANGIOGRAM; IR INTERNALIZATION OF EXTERNAL BILIARY DRAIN  Date: 05/09/2012  Clinical History: 66 year old male with distal biliary obstruction and obstructive jaundice.  An externalized percutaneous transhepatic biliary drain was placed in the distal bile duct above the obstruction, however the patient is bilirubin continues to rise.  He presents to interventional radiology for cholangiogram followed by possible internalization of a biliary drain if the obstructing lesion cannot be crossed versus upsizing of the existing biliary drain which is not.  The adequately draining the system.  Procedures Performed: 1. Sheath cholangiogram 2.  Successful crossing of the distal biliary obstruction 3.  Placement of a 10 French internal/external transhepatic biliary drain  Interventional Radiologist:  Sterling Big, MD  Sedation: Moderate (conscious) sedation was used.  Two mg Versed, 100 mcg Fentanyl and 2 mg Dilaudid were administered intravenously. The patient's vital signs were monitored continuously by radiology nursing throughout the procedure.  Sedation Time:  25 minutes  Fluoroscopy time: 12.2  Contrast volume: 30 ml Omnipaque-300 administered into the biliary system  PROCEDURE/FINDINGS:   Informed consent was obtained from the patient following explanation of the procedure, risks, benefits and alternatives. The patient understands, agrees and consents for the procedure. All questions were addressed. A time out was performed.  Maximal barrier sterile technique utilized including caps, mask, sterile gowns, sterile gloves, large sterile drape, hand hygiene, and betadine skin prep.  The existing external 8-French biliary drain was transected and a wire advanced into the distal common bile duct.  The drain was then removed over the wire.  A 9-French vascular sheath was advanced over a wire and positioned in the central right posterior hepatic duct.  A formal cholangiogram was then performed by injecting contrast material through the sheath.  The cholangiogram was obtained in multiple oblique images.  There is mild intra and extrahepatic biliary ductal dilatation with complete obstruction of the distal common bile duct.  The cystic duct is patent.  Both the left, and right biliary tree communicate, no amputated ducts or evidence of undrained hepatic segments.  No cholangiogram findings to suggest primary biliary cirrhosis, or other cholangiopathy.  An angled catheter was then advanced over the wire and using an angled Glidewire, the distal biliary obstruction was successfully crossed.  A superstiff Amplatz wire was then advanced into the proximal small bowel.  A 10-French Cook biliary drainage catheter was then advanced over the wire and the locking loop was positioned in the  proximal duodenum. A final hand injection of contrast material through the tube confirmed tube replacement, as well as internal/external drainage.  The catheter was secured with 0-prolene suture and connected to gravity bag drainage.  The patient tolerated the procedure well, there is no immediate  complication.  IMPRESSION:  1.  Percutaneous transhepatic cholangiogram demonstrates excellent communication between the left and right intrahepatic biliary systems, as well as patency of the cystic duct.  There is a complete obstruction of the distal common bile duct just proximal to the ampulla of Vater.  There is mild - moderate intra and extrahepatic biliary ductal dilatation.  2. Successful crossing of the distal biliary obstruction followed by placement of a 10 French internal/external transhepatic biliary drain.  The drain should be left to external drainage until the serum bilirubin has sufficiently decreased.  Once it is clear that the drainage is adequate, the tube can be capped and the bile allowed to drain through the tube into the duodenum.  3.  If the biopsy results come back as malignant, the cholangiogram findings suggest that the patient would be a candidate for placement of a metallic biliary stent via a transhepatic approach if they are not a surgical candidate. If the obstruction is benign, we could consider serial cholangioplasty.  These results were called by telephone on 05/09/2012 at 04:00 p.m. to Dr. Evette Cristal, who verbally acknowledged these results.  Signed,  Sterling Big, MD Vascular & Interventional Radiologist [facility] Radiology   Original Report Authenticated By: Vilma Prader     Anti-infectives: Anti-infectives     Start     Dose/Rate Route Frequency Ordered Stop   05/04/12 1100  piperacillin-tazobactam (ZOSYN) IVPB 3.375 g       3.375 g 12.5 mL/hr over 240 Minutes Intravenous Every 8 hours 05/04/12 1013     05/04/12 1000   piperacillin-tazo (ZOSYN) NICU IV syringe 200 mg/mL  Status:  Discontinued        75 mg/kg  70.4 kg 52.8 mL/hr over 30 Minutes Intravenous Every 8 hours 05/04/12 0947 05/04/12 1011          Assessment/Plan: s/p Procedure(s): ENDOSCOPIC RETROGRADE CHOLANGIOPANCREATOGRAPHY (ERCP) UPPER ENDOSCOPIC ULTRASOUND (EUS) RADIAL Duodenal/ampulary mass  with CBD obstruction,  Bxs pending.  Bili trending down some Wants to drink, try CL.  On TNA Dr Donell Beers to evaluate re resectability   LOS: 9 days    Alayja Armas T 05/10/2012

## 2012-05-10 NOTE — Progress Notes (Signed)
Subjective:  Patient denies any chest pain or shortness of breath denies abdominal pain complaints of itching  Objective:  Vital Signs in the last 24 hours: Temp:  [97.5 F (36.4 C)-98.8 F (37.1 C)] 98.8 F (37.1 C) (09/21 1026) Pulse Rate:  [58-83] 67  (09/21 1026) Resp:  [13-28] 18  (09/21 1026) BP: (113-209)/(64-102) 144/74 mmHg (09/21 1026) SpO2:  [92 %-97 %] 94 % (09/21 1026) Weight:  [71.8 kg (158 lb 4.6 oz)] 71.8 kg (158 lb 4.6 oz) (09/21 0500)  Intake/Output from previous day: 09/20 0701 - 09/21 0700 In: 1585.7 [I.V.:1535.7; IV Piggyback:50] Out: 475 [Drains:475] Intake/Output from this shift: Total I/O In: 572 [TPN:572] Out: 300 [Drains:300]  Physical Exam: Neck: no adenopathy, no carotid bruit, no JVD and supple, symmetrical, trachea midline Lungs: clear to auscultation bilaterally Heart: regular rate and rhythm, S1, S2 normal, no murmur, click, rub or gallop Abdomen: soft, non-tender; bowel sounds normal; no masses,  no organomegaly and Biliary drain noted Extremities: extremities normal, atraumatic, no cyanosis or edema  Lab Results:  Basename 05/09/12 0440 05/08/12 0440  WBC 8.9 8.3  HGB 15.3 13.6  PLT 177 200    Basename 05/10/12 0411 05/09/12 0440  NA 134* 132*  K 3.7 3.2*  CL 100 97  CO2 21 21  GLUCOSE 120* 89  BUN 6 4*  CREATININE 0.95 0.99   No results found for this basename: TROPONINI:2,CK,MB:2 in the last 72 hours Hepatic Function Panel  Basename 05/10/12 0411  PROT 6.7  ALBUMIN 2.8*  AST 55*  ALT 95*  ALKPHOS 337*  BILITOT 11.4*  BILIDIR --  IBILI --    Basename 05/09/12 0440  CHOL 189   No results found for this basename: PROTIME in the last 72 hours  Imaging: Imaging results have been reviewed and Ct Chest W Contrast  05/09/2012  *RADIOLOGY REPORT*  Clinical Data: Rule out metastatic disease. Recent history of biliary obstruction due to a mass.  Jaundice and epigastric pain.  CT CHEST WITH CONTRAST  Technique:   Multidetector CT imaging of the chest was performed following the standard protocol during bolus administration of intravenous contrast.  Contrast: 80mL OMNIPAQUE IOHEXOL 300 MG/ML  SOLN  Comparison: No priors.  Findings:  Mediastinum: Heart size is mildly enlarged. There is no significant pericardial fluid, thickening or pericardial calcification. There is atherosclerosis of the thoracic aorta, the great vessels of the mediastinum and the coronary arteries, including calcified atherosclerotic plaque in the left main, left anterior descending and right coronary arteries. Probable coronary artery stents in the proximal left anterior descending and distal right coronary arteries.  There is a 1.4 x 2.0 cm subcarinal node or a conglomerate of lymph nodes.  No other pathologically enlarged mediastinal or hilar lymph nodes are noted.  Esophagus is unremarkable in appearance.  Lungs/Pleura: No definite suspicious appearing pulmonary nodules or masses are identified on today's examination.  There is extensive air space consolidation and some volume loss throughout the lower lobes of the lungs bilaterally (right greater than left), concerning for pneumonia or sequelae of aspiration.  Trace right pleural effusion layering dependently.  Upper Abdomen: Biliary stents in place leading from the right hepatic ducts into the common bile duct (incompletely visualized). High attenuation material within the gallbladder from recent percutaneous cholangiogram.  Small amount of pneumobilia. Potential filling defects in the left branch of the portal vein, which is suspicious for incomplete portal vein thrombosis (image 47 of series 2).  Musculoskeletal: There are no aggressive appearing lytic or blastic  lesions noted in the visualized portions of the skeleton.  IMPRESSION: 1.  No definite findings to suggest metastatic disease to the chest. 2.  There is an enlarged subcarinal lymph node or cluster of small subcarinal lymph nodes, as  above, which may simply be reactive. Attention on follow-up studies may be warranted. 3.  Bibasilar air space consolidation and some degree of bilateral lower lobe atelectasis (right greater than left), concerning for multilobar pneumonia and/or sequelae of aspiration. 3. 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 4.  Atherosclerosis, including left main and two-vessel coronary artery disease.  Probable coronary artery stents in the proximal left anterior descending and distal right coronary artery territories. 5. Post procedural changes in the upper abdomen, as above.   Original Report Authenticated By: Florencia Reasons, M.D.    Ir Cholan Exist Tube  05/09/2012  *RADIOLOGY REPORT*  IR CHOLANGIOGRAM; IR INTERNALIZATION OF EXTERNAL BILIARY DRAIN  Date: 05/09/2012  Clinical History: 66 year old male with distal biliary obstruction and obstructive jaundice.  An externalized percutaneous transhepatic biliary drain was placed in the distal bile duct above the obstruction, however the patient is bilirubin continues to rise.  He presents to interventional radiology for cholangiogram followed by possible internalization of a biliary drain if the obstructing lesion cannot be crossed versus upsizing of the existing biliary drain which is not.  The adequately draining the system.  Procedures Performed: 1. Sheath cholangiogram 2.  Successful crossing of the distal biliary obstruction 3.  Placement of a 10 French internal/external transhepatic biliary drain  Interventional Radiologist:  Sterling Big, MD  Sedation: Moderate (conscious) sedation was used.  Two mg Versed, 100 mcg Fentanyl and 2 mg Dilaudid were administered intravenously. The patient's vital signs were monitored continuously by radiology nursing throughout the procedure.  Sedation Time: 25 minutes  Fluoroscopy time: 12.2  Contrast volume: 30 ml Omnipaque-300 administered into the  biliary system  PROCEDURE/FINDINGS:   Informed consent was obtained from the patient following explanation of the procedure, risks, benefits and alternatives. The patient understands, agrees and consents for the procedure. All questions were addressed. A time out was performed.  Maximal barrier sterile technique utilized including caps, mask, sterile gowns, sterile gloves, large sterile drape, hand hygiene, and betadine skin prep.  The existing external 8-French biliary drain was transected and a wire advanced into the distal common bile duct.  The drain was then removed over the wire.  A 9-French vascular sheath was advanced over a wire and positioned in the central right posterior hepatic duct.  A formal cholangiogram was then performed by injecting contrast material through the sheath.  The cholangiogram was obtained in multiple oblique images.  There is mild intra and extrahepatic biliary ductal dilatation with complete obstruction of the distal common bile duct.  The cystic duct is patent.  Both the left, and right biliary tree communicate, no amputated ducts or evidence of undrained hepatic segments.  No cholangiogram findings to suggest primary biliary cirrhosis, or other cholangiopathy.  An angled catheter was then advanced over the wire and using an angled Glidewire, the distal biliary obstruction was successfully crossed.  A superstiff Amplatz wire was then advanced into the proximal small bowel.  A 10-French Cook biliary drainage catheter was then advanced over the wire and the locking loop was positioned in the proximal duodenum. A final hand injection of contrast material through the tube confirmed tube replacement, as well as internal/external drainage.  The catheter was secured  with 0-prolene suture and connected to gravity bag drainage.  The patient tolerated the procedure well, there is no immediate complication.  IMPRESSION:  1.  Percutaneous transhepatic cholangiogram demonstrates excellent  communication between the left and right intrahepatic biliary systems, as well as patency of the cystic duct.  There is a complete obstruction of the distal common bile duct just proximal to the ampulla of Vater.  There is mild - moderate intra and extrahepatic biliary ductal dilatation.  2. Successful crossing of the distal biliary obstruction followed by placement of a 10 French internal/external transhepatic biliary drain.  The drain should be left to external drainage until the serum bilirubin has sufficiently decreased.  Once it is clear that the drainage is adequate, the tube can be capped and the bile allowed to drain through the tube into the duodenum.  3.  If the biopsy results come back as malignant, the cholangiogram findings suggest that the patient would be a candidate for placement of a metallic biliary stent via a transhepatic approach if they are not a surgical candidate. If the obstruction is benign, we could consider serial cholangioplasty.  These results were called by telephone on 05/09/2012 at 04:00 p.m. to Dr. Evette Cristal, who verbally acknowledged these results.  Signed,  Sterling Big, MD Vascular & Interventional Radiologist [facility]   Original Report Authenticated By: Vilma Prader    Ir Biliary Drain Catheter Placement  05/09/2012  *RADIOLOGY REPORT*  IR CHOLANGIOGRAM; IR INTERNALIZATION OF EXTERNAL BILIARY DRAIN  Date: 05/09/2012  Clinical History: 66 year old male with distal biliary obstruction and obstructive jaundice.  An externalized percutaneous transhepatic biliary drain was placed in the distal bile duct above the obstruction, however the patient is bilirubin continues to rise.  He presents to interventional radiology for cholangiogram followed by possible internalization of a biliary drain if the obstructing lesion cannot be crossed versus upsizing of the existing biliary drain which is not.  The adequately draining the system.  Procedures Performed: 1. Sheath  cholangiogram 2.  Successful crossing of the distal biliary obstruction 3.  Placement of a 10 French internal/external transhepatic biliary drain  Interventional Radiologist:  Sterling Big, MD  Sedation: Moderate (conscious) sedation was used.  Two mg Versed, 100 mcg Fentanyl and 2 mg Dilaudid were administered intravenously. The patient's vital signs were monitored continuously by radiology nursing throughout the procedure.  Sedation Time: 25 minutes  Fluoroscopy time: 12.2  Contrast volume: 30 ml Omnipaque-300 administered into the biliary system  PROCEDURE/FINDINGS:   Informed consent was obtained from the patient following explanation of the procedure, risks, benefits and alternatives. The patient understands, agrees and consents for the procedure. All questions were addressed. A time out was performed.  Maximal barrier sterile technique utilized including caps, mask, sterile gowns, sterile gloves, large sterile drape, hand hygiene, and betadine skin prep.  The existing external 8-French biliary drain was transected and a wire advanced into the distal common bile duct.  The drain was then removed over the wire.  A 9-French vascular sheath was advanced over a wire and positioned in the central right posterior hepatic duct.  A formal cholangiogram was then performed by injecting contrast material through the sheath.  The cholangiogram was obtained in multiple oblique images.  There is mild intra and extrahepatic biliary ductal dilatation with complete obstruction of the distal common bile duct.  The cystic duct is patent.  Both the left, and right biliary tree communicate, no amputated ducts or evidence of undrained hepatic segments.  No cholangiogram findings  to suggest primary biliary cirrhosis, or other cholangiopathy.  An angled catheter was then advanced over the wire and using an angled Glidewire, the distal biliary obstruction was successfully crossed.  A superstiff Amplatz wire was then advanced  into the proximal small bowel.  A 10-French Cook biliary drainage catheter was then advanced over the wire and the locking loop was positioned in the proximal duodenum. A final hand injection of contrast material through the tube confirmed tube replacement, as well as internal/external drainage.  The catheter was secured with 0-prolene suture and connected to gravity bag drainage.  The patient tolerated the procedure well, there is no immediate complication.  IMPRESSION:  1.  Percutaneous transhepatic cholangiogram demonstrates excellent communication between the left and right intrahepatic biliary systems, as well as patency of the cystic duct.  There is a complete obstruction of the distal common bile duct just proximal to the ampulla of Vater.  There is mild - moderate intra and extrahepatic biliary ductal dilatation.  2. Successful crossing of the distal biliary obstruction followed by placement of a 10 French internal/external transhepatic biliary drain.  The drain should be left to external drainage until the serum bilirubin has sufficiently decreased.  Once it is clear that the drainage is adequate, the tube can be capped and the bile allowed to drain through the tube into the duodenum.  3.  If the biopsy results come back as malignant, the cholangiogram findings suggest that the patient would be a candidate for placement of a metallic biliary stent via a transhepatic approach if they are not a surgical candidate. If the obstruction is benign, we could consider serial cholangioplasty.  These results were called by telephone on 05/09/2012 at 04:00 p.m. to Dr. Evette Cristal, who verbally acknowledged these results.  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

## 2012-05-11 LAB — COMPREHENSIVE METABOLIC PANEL
Albumin: 2.6 g/dL — ABNORMAL LOW (ref 3.5–5.2)
Alkaline Phosphatase: 272 U/L — ABNORMAL HIGH (ref 39–117)
BUN: 16 mg/dL (ref 6–23)
Calcium: 8.7 mg/dL (ref 8.4–10.5)
Creatinine, Ser: 0.87 mg/dL (ref 0.50–1.35)
Potassium: 3.4 mEq/L — ABNORMAL LOW (ref 3.5–5.1)
Total Protein: 6.3 g/dL (ref 6.0–8.3)

## 2012-05-11 LAB — GLUCOSE, CAPILLARY
Glucose-Capillary: 131 mg/dL — ABNORMAL HIGH (ref 70–99)
Glucose-Capillary: 135 mg/dL — ABNORMAL HIGH (ref 70–99)
Glucose-Capillary: 138 mg/dL — ABNORMAL HIGH (ref 70–99)
Glucose-Capillary: 117 mg/dL — ABNORMAL HIGH (ref 70–99)

## 2012-05-11 MED ORDER — HYDROXYZINE HCL 10 MG PO TABS
10.0000 mg | ORAL_TABLET | Freq: Three times a day (TID) | ORAL | Status: DC | PRN
  Administered 2012-05-11 – 2012-05-23 (×8): 10 mg via ORAL
  Filled 2012-05-11 (×5): qty 1

## 2012-05-11 MED ORDER — KCL IN DEXTROSE-NACL 20-5-0.9 MEQ/L-%-% IV SOLN
INTRAVENOUS | Status: DC
  Administered 2012-05-11 – 2012-05-14 (×3): via INTRAVENOUS
  Filled 2012-05-11 (×5): qty 1000

## 2012-05-11 MED ORDER — CLINIMIX E/DEXTROSE (5/20) 5 % IV SOLN
INTRAVENOUS | Status: AC
  Administered 2012-05-11: 18:00:00 via INTRAVENOUS
  Filled 2012-05-11: qty 2000

## 2012-05-11 MED ORDER — POTASSIUM CHLORIDE 10 MEQ/100ML IV SOLN
10.0000 meq | INTRAVENOUS | Status: AC
  Administered 2012-05-11 (×2): 10 meq via INTRAVENOUS
  Filled 2012-05-11 (×2): qty 100

## 2012-05-11 NOTE — Progress Notes (Signed)
Ppt c/o itching, generalized. Daughter states pt prefers po tab as it works "better than IV med". Will contact PCP [facility] for orders.

## 2012-05-11 NOTE — Progress Notes (Signed)
PARENTERAL NUTRITION CONSULT NOTE - Follow Up  Pharmacy Consult for TNA Indication: pancreatitis, duodenal obstruction  No Known Allergies  Patient Measurements: Height: 5\' 6"  (167.6 cm) (pt. stated) Weight: 160 lb 4.4 oz (72.7 kg) IBW/kg (Calculated) : 63.8   Vital Signs: Temp: 97.6 F (36.4 C) (09/22 0619) Temp src: Oral (09/22 0619) BP: 154/67 mmHg (09/22 0619) Pulse Rate: 61  (09/22 0619) Intake/Output from previous day: 09/21 0701 - 09/22 0700 In: 3312 [I.V.:1460; TPN:1852] Out: 2100 [Urine:400; Drains:1700] Intake/Output from this shift: Total I/O In: -  Out: 250 [Drains:250]  Labs:  [facility] 05/09/12 0440  WBC 8.9  HGB 15.3  HCT 44.3  PLT 177  APTT --  INR --     Basename 05/11/12 0825 05/10/12 0411 05/09/12 0440  NA 132* 134* 132*  K 3.4* 3.7 3.2*  CL 100 100 97  CO2 23 21 21   GLUCOSE 131* 120* 89  BUN 16 6 4*  CREATININE 0.87 0.95 0.99  LABCREA -- -- --  CREAT24HRUR -- -- --  CALCIUM 8.7 9.0 9.5  MG -- -- 2.0  PHOS -- -- 2.9  PROT 6.3 6.7 7.2  ALBUMIN 2.6* 2.8* 3.2*  AST 36 55* 80*  ALT 64* 95* 127*  ALKPHOS 272* 337* 354*  BILITOT 11.2* 11.4* 12.2*  BILIDIR -- -- --  IBILI -- -- --  PREALBUMIN -- -- 12.6*  TRIG -- 385* 422*  CHOLHDL -- -- --  CHOL -- -- 189   Estimated Creatinine Clearance: 76.4 ml/min (by C-G formula based on Cr of 0.87).    Basename 05/11/12 0536 05/11/12 0005 05/10/12 1900  GLUCAP 131* 138* 135*   Insulin Requirements in the past 24 hours:   2 units  Nutritional Goals:  Kcal:1700-1950  Protein:70-85g  Fluid:1.7-1.9L  Current Nutrition:  CL started 9/21 AM IVF: D5-1/2NS @ 65 ml/hr  Clinimix E 5/20 advanced to goal rate of 70 ml/hr + lipids at 10 ml/hr will provide: 84 g/day protein and 1958 Kcal/day MWF, 1478 Kcal/day STTHS (Avg. 1684 Kcal/day weekly).  Assessment: 66 YOM presented 9/12 w/ abdominal tightness and nausea. 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 S/P ERCP, biopsies  pending. Had biliary drain x 2 /18 and 9/20. Surgery planned next week  Electrolytes:  K+ low again today. Na+ remains low, cannot adjust in pre-mixed TNA. Other lytes WNL LFTs: Tbili 11.2, Alk phos down, AST down wnl, ALT down and almost wnl. S/P 2nd biliary drain 9/20.  SCr:  WNL TGs/Cholesterol: TG 422 (9/20), down to 385 yesterday Pre-albumin: 12.6 (9/20)  Plan:   KCL IV q1h x 2, F/U AM K  Change TNA to Clinimix E 5/20% and increase to goal rate 16ml/hr. Decrease MIVF 96ml/hr  F/U TG tomorrow - Lipids MWF only due to shortage if appropriate  TNA to contain standard multivitamins and trace elements (MWF only due to ongoing shortage).  Continue sensitive SSI q6h.  TNA lab panels on Mondays & Thursdays.  Gwen Her PharmD  [phone] 05/11/2012 9:03 AM

## 2012-05-11 NOTE — Progress Notes (Signed)
Subjective:  Patient denies any chest pain abdominal pain or shortness of breath complains of itching  Objective:  Vital Signs in the last 24 hours: Temp:  [97.4 F (36.3 C)-98.8 F (37.1 C)] 97.6 F (36.4 C) (09/22 0619) Pulse Rate:  [60-67] 61  (09/22 0619) Resp:  [16-20] 20  (09/22 0619) BP: (143-162)/(59-82) 154/67 mmHg (09/22 0619) SpO2:  [90 %-100 %] 100 % (09/22 0619) Weight:  [72.7 kg (160 lb 4.4 oz)] 72.7 kg (160 lb 4.4 oz) (09/22 0619)  Intake/Output from previous day: 09/21 0701 - 09/22 0700 In: 3312 [I.V.:1460; TPN:1852] Out: 2100 [Urine:400; Drains:1700] Intake/Output from this shift: Total I/O In: -  Out: 250 [Drains:250]  Physical Exam: Neck: no adenopathy, no carotid bruit, no JVD and supple, symmetrical, trachea midline Lungs: clear to auscultation bilaterally Heart: regular rate and rhythm, S1, S2 normal, no murmur, click, rub or gallop Abdomen: Soft bowel sounds present mildly distended biliary drain noted Extremities: extremities normal, atraumatic, no cyanosis or edema  Lab Results:  Basename 05/09/12 0440  WBC 8.9  HGB 15.3  PLT 177    Basename 05/11/12 0825 05/10/12 0411  NA 132* 134*  K 3.4* 3.7  CL 100 100  CO2 23 21  GLUCOSE 131* 120*  BUN 16 6  CREATININE 0.87 0.95   No results found for this basename: TROPONINI:2,CK,MB:2 in the last 72 hours Hepatic Function Panel  Basename 05/11/12 0825  PROT 6.3  ALBUMIN 2.6*  AST 36  ALT 64*  ALKPHOS 272*  BILITOT 11.2*  BILIDIR --  IBILI --    Basename 05/09/12 0440  CHOL 189   No results found for this basename: PROTIME in the last 72 hours  Imaging: Imaging results have been reviewed and Ct Chest W Contrast  05/09/2012  *RADIOLOGY REPORT*  Clinical Data: Rule out metastatic disease. Recent history of biliary obstruction due to a mass.  Jaundice and epigastric pain.  CT CHEST WITH CONTRAST  Technique:  Multidetector CT imaging of the chest was performed following the standard  protocol during bolus administration of intravenous contrast.  Contrast: 80mL OMNIPAQUE IOHEXOL 300 MG/ML  SOLN  Comparison: No priors.  Findings:  Mediastinum: Heart size is mildly enlarged. There is no significant pericardial fluid, thickening or pericardial calcification. There is atherosclerosis of the thoracic aorta, the great vessels of the mediastinum and the coronary arteries, including calcified atherosclerotic plaque in the left main, left anterior descending and right coronary arteries. Probable coronary artery stents in the proximal left anterior descending and distal right coronary arteries.  There is a 1.4 x 2.0 cm subcarinal node or a conglomerate of lymph nodes.  No other pathologically enlarged mediastinal or hilar lymph nodes are noted.  Esophagus is unremarkable in appearance.  Lungs/Pleura: No definite suspicious appearing pulmonary nodules or masses are identified on today's examination.  There is extensive air space consolidation and some volume loss throughout the lower lobes of the lungs bilaterally (right greater than left), concerning for pneumonia or sequelae of aspiration.  Trace right pleural effusion layering dependently.  Upper Abdomen: Biliary stents in place leading from the right hepatic ducts into the common bile duct (incompletely visualized). High attenuation material within the gallbladder from recent percutaneous cholangiogram.  Small amount of pneumobilia. Potential filling defects in the left branch of the portal vein, which is suspicious for incomplete portal vein thrombosis (image 47 of series 2).  Musculoskeletal: There are no aggressive appearing lytic or blastic lesions noted in the visualized portions of the skeleton.  IMPRESSION:  1.  No definite findings to suggest metastatic disease to the chest. 2.  There is an enlarged subcarinal lymph node or cluster of small subcarinal lymph nodes, as above, which may simply be reactive. Attention on follow-up studies may be  warranted. 3.  Bibasilar air space consolidation and some degree of bilateral lower lobe atelectasis (right greater than left), concerning for multilobar pneumonia and/or sequelae of aspiration. 3. 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 4.  Atherosclerosis, including left main and two-vessel coronary artery disease.  Probable coronary artery stents in the proximal left anterior descending and distal right coronary artery territories. 5. Post procedural changes in the upper abdomen, as above.   Original Report Authenticated By: Florencia Reasons, M.D.    Ir Cholan Exist Tube  05/09/2012  *RADIOLOGY REPORT*  IR CHOLANGIOGRAM; IR INTERNALIZATION OF EXTERNAL BILIARY DRAIN  Date: 05/09/2012  Clinical History: 66 year old male with distal biliary obstruction and obstructive jaundice.  An externalized percutaneous transhepatic biliary drain was placed in the distal bile duct above the obstruction, however the patient is bilirubin continues to rise.  He presents to interventional radiology for cholangiogram followed by possible internalization of a biliary drain if the obstructing lesion cannot be crossed versus upsizing of the existing biliary drain which is not.  The adequately draining the system.  Procedures Performed: 1. Sheath cholangiogram 2.  Successful crossing of the distal biliary obstruction 3.  Placement of a 10 French internal/external transhepatic biliary drain  Interventional Radiologist:  Sterling Big, MD  Sedation: Moderate (conscious) sedation was used.  Two mg Versed, 100 mcg Fentanyl and 2 mg Dilaudid were administered intravenously. The patient's vital signs were monitored continuously by radiology nursing throughout the procedure.  Sedation Time: 25 minutes  Fluoroscopy time: 12.2  Contrast volume: 30 ml Omnipaque-300 administered into the biliary system  PROCEDURE/FINDINGS:   Informed consent was obtained from the  patient following explanation of the procedure, risks, benefits and alternatives. The patient understands, agrees and consents for the procedure. All questions were addressed. A time out was performed.  Maximal barrier sterile technique utilized including caps, mask, sterile gowns, sterile gloves, large sterile drape, hand hygiene, and betadine skin prep.  The existing external 8-French biliary drain was transected and a wire advanced into the distal common bile duct.  The drain was then removed over the wire.  A 9-French vascular sheath was advanced over a wire and positioned in the central right posterior hepatic duct.  A formal cholangiogram was then performed by injecting contrast material through the sheath.  The cholangiogram was obtained in multiple oblique images.  There is mild intra and extrahepatic biliary ductal dilatation with complete obstruction of the distal common bile duct.  The cystic duct is patent.  Both the left, and right biliary tree communicate, no amputated ducts or evidence of undrained hepatic segments.  No cholangiogram findings to suggest primary biliary cirrhosis, or other cholangiopathy.  An angled catheter was then advanced over the wire and using an angled Glidewire, the distal biliary obstruction was successfully crossed.  A superstiff Amplatz wire was then advanced into the proximal small bowel.  A 10-French Cook biliary drainage catheter was then advanced over the wire and the locking loop was positioned in the proximal duodenum. A final hand injection of contrast material through the tube confirmed tube replacement, as well as internal/external drainage.  The catheter was secured with 0-prolene suture and connected to gravity bag drainage.  The  patient tolerated the procedure well, there is no immediate complication.  IMPRESSION:  1.  Percutaneous transhepatic cholangiogram demonstrates excellent communication between the left and right intrahepatic biliary systems, as well as  patency of the cystic duct.  There is a complete obstruction of the distal common bile duct just proximal to the ampulla of Vater.  There is mild - moderate intra and extrahepatic biliary ductal dilatation.  2. Successful crossing of the distal biliary obstruction followed by placement of a 10 French internal/external transhepatic biliary drain.  The drain should be left to external drainage until the serum bilirubin has sufficiently decreased.  Once it is clear that the drainage is adequate, the tube can be capped and the bile allowed to drain through the tube into the duodenum.  3.  If the biopsy results come back as malignant, the cholangiogram findings suggest that the patient would be a candidate for placement of a metallic biliary stent via a transhepatic approach if they are not a surgical candidate. If the obstruction is benign, we could consider serial cholangioplasty.  These results were called by telephone on 05/09/2012 at 04:00 p.m. to Dr. Evette Cristal, who verbally acknowledged these results.  Signed,  Sterling Big, MD Vascular & Interventional Radiologist [facility] Radiology   Original Report Authenticated By: Vilma Prader    Ir Biliary Drain Catheter Placement  05/09/2012  *RADIOLOGY REPORT*  IR CHOLANGIOGRAM; IR INTERNALIZATION OF EXTERNAL BILIARY DRAIN  Date: 05/09/2012  Clinical History: 66 year old male with distal biliary obstruction and obstructive jaundice.  An externalized percutaneous transhepatic biliary drain was placed in the distal bile duct above the obstruction, however the patient is bilirubin continues to rise.  He presents to interventional radiology for cholangiogram followed by possible internalization of a biliary drain if the obstructing lesion cannot be crossed versus upsizing of the existing biliary drain which is not.  The adequately draining the system.  Procedures Performed: 1. Sheath cholangiogram 2.  Successful crossing of the distal biliary obstruction 3.  Placement of a 10  French internal/external transhepatic biliary drain  Interventional Radiologist:  Sterling Big, MD  Sedation: Moderate (conscious) sedation was used.  Two mg Versed, 100 mcg Fentanyl and 2 mg Dilaudid were administered intravenously. The patient's vital signs were monitored continuously by radiology nursing throughout the procedure.  Sedation Time: 25 minutes  Fluoroscopy time: 12.2  Contrast volume: 30 ml Omnipaque-300 administered into the biliary system  PROCEDURE/FINDINGS:   Informed consent was obtained from the patient following explanation of the procedure, risks, benefits and alternatives. The patient understands, agrees and consents for the procedure. All questions were addressed. A time out was performed.  Maximal barrier sterile technique utilized including caps, mask, sterile gowns, sterile gloves, large sterile drape, hand hygiene, and betadine skin prep.  The existing external 8-French biliary drain was transected and a wire advanced into the distal common bile duct.  The drain was then removed over the wire.  A 9-French vascular sheath was advanced over a wire and positioned in the central right posterior hepatic duct.  A formal cholangiogram was then performed by injecting contrast material through the sheath.  The cholangiogram was obtained in multiple oblique images.  There is mild intra and extrahepatic biliary ductal dilatation with complete obstruction of the distal common bile duct.  The cystic duct is patent.  Both the left, and right biliary tree communicate, no amputated ducts or evidence of undrained hepatic segments.  No cholangiogram findings to suggest primary biliary cirrhosis, or other cholangiopathy.  An angled  catheter was then advanced over the wire and using an angled Glidewire, the distal biliary obstruction was successfully crossed.  A superstiff Amplatz wire was then advanced into the proximal small bowel.  A 10-French Cook biliary drainage catheter was then advanced over  the wire and the locking loop was positioned in the proximal duodenum. A final hand injection of contrast material through the tube confirmed tube replacement, as well as internal/external drainage.  The catheter was secured with 0-prolene suture and connected to gravity bag drainage.  The patient tolerated the procedure well, there is no immediate complication.  IMPRESSION:  1.  Percutaneous transhepatic cholangiogram demonstrates excellent communication between the left and right intrahepatic biliary systems, as well as patency of the cystic duct.  There is a complete obstruction of the distal common bile duct just proximal to the ampulla of Vater.  There is mild - moderate intra and extrahepatic biliary ductal dilatation.  2. Successful crossing of the distal biliary obstruction followed by placement of a 10 French internal/external transhepatic biliary drain.  The drain should be left to external drainage until the serum bilirubin has sufficiently decreased.  Once it is clear that the drainage is adequate, the tube can be capped and the bile allowed to drain through the tube into the duodenum.  3.  If the biopsy results come back as malignant, the cholangiogram findings suggest that the patient would be a candidate for placement of a metallic biliary stent via a transhepatic approach if they are not a surgical candidate. If the obstruction is benign, we could consider serial cholangioplasty.  These results were called by telephone on 05/09/2012 at 04:00 p.m. to Dr. Evette Cristal, who verbally acknowledged these results.  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 Awaiting biopsy results Further management per GI/surgery  LOS: 10 days    Japneet Staggs N 05/11/2012, 10:17 AM

## 2012-05-12 LAB — GLUCOSE, CAPILLARY
Glucose-Capillary: 143 mg/dL — ABNORMAL HIGH (ref 70–99)
Glucose-Capillary: 125 mg/dL — ABNORMAL HIGH (ref 70–99)

## 2012-05-12 LAB — COMPREHENSIVE METABOLIC PANEL
BUN: 15 mg/dL (ref 6–23)
CO2: 20 mEq/L (ref 19–32)
Calcium: 8.6 mg/dL (ref 8.4–10.5)
Creatinine, Ser: 0.79 mg/dL (ref 0.50–1.35)
GFR calc Af Amer: >90 mL/min (ref >90)
GFR calc non Af Amer: >90 mL/min (ref >90)
Glucose, Bld: 134 mg/dL — ABNORMAL HIGH (ref 70–99)

## 2012-05-12 LAB — DIFFERENTIAL
Basophils Absolute: 0 10*3/uL (ref 0.0–0.1)
Basophils Relative: 0 % (ref 0–1)
Eosinophils Absolute: 0.5 10*3/uL (ref 0.0–0.7)
Neutro Abs: 5 10*3/uL (ref 1.7–7.7)
Neutrophils Relative %: 65 % (ref 43–77)

## 2012-05-12 LAB — PHOSPHORUS: Phosphorus: 3.8 mg/dL (ref 2.3–4.6)

## 2012-05-12 LAB — TRIGLYCERIDES: Triglycerides: 237 mg/dL — ABNORMAL HIGH (ref <150)

## 2012-05-12 LAB — CBC
MCH: 28.5 pg (ref 26.0–34.0)
MCHC: 35 g/dL (ref 30.0–36.0)
Platelets: 213 10*3/uL (ref 150–400)
RDW: 16.1 % — ABNORMAL HIGH (ref 11.5–15.5)

## 2012-05-12 LAB — MAGNESIUM: Magnesium: 2.2 mg/dL (ref 1.5–2.5)

## 2012-05-12 LAB — PREALBUMIN: Prealbumin: 7.5 mg/dL — ABNORMAL LOW (ref 17.0–34.0)

## 2012-05-12 MED ORDER — DIPHENHYDRAMINE HCL 50 MG/ML IJ SOLN
25.0000 mg | INTRAMUSCULAR | Status: DC | PRN
  Administered 2012-05-12: 21:00:00 via INTRAVENOUS
  Administered 2012-05-13 – 2012-05-15 (×12): 25 mg via INTRAVENOUS
  Administered 2012-05-16: 50 mg via INTRAVENOUS
  Administered 2012-05-16 – 2012-05-22 (×11): 25 mg via INTRAVENOUS
  Filled 2012-05-12 (×25): qty 1

## 2012-05-12 MED ORDER — FLEET ENEMA 7-19 GM/118ML RE ENEM
1.0000 | ENEMA | Freq: Once | RECTAL | Status: AC
  Administered 2012-05-12: 1 via RECTAL
  Filled 2012-05-12: qty 1

## 2012-05-12 MED ORDER — FAT EMULSION 20 % IV EMUL
250.0000 mL | INTRAVENOUS | Status: AC
  Administered 2012-05-12: 250 mL via INTRAVENOUS
  Filled 2012-05-12: qty 250

## 2012-05-12 MED ORDER — ZINC TRACE METAL 1 MG/ML IV SOLN
INTRAVENOUS | Status: AC
  Administered 2012-05-12: 18:00:00 via INTRAVENOUS
  Filled 2012-05-12: qty 2000

## 2012-05-12 NOTE — Progress Notes (Signed)
PARENTERAL NUTRITION CONSULT NOTE - Follow Up  Pharmacy Consult for TNA Indication: pancreatitis, duodenal obstruction  No Known Allergies  Patient Measurements: Height: 5\' 6"  (167.6 cm) (pt. stated) Weight: 160 lb 4.4 oz (72.7 kg) IBW/kg (Calculated) : 63.8   Vital Signs: Temp: 98.6 F (37 C) (09/23 0655) Temp src: Oral (09/23 0655) BP: 138/53 mmHg (09/23 0655) Pulse Rate: 62  (09/23 0655) Intake/Output from previous day: 09/22 0701 - 09/23 0700 In: 2145.3 [I.V.:1300.3; TPN:840] Out: 2400 [Urine:1100; Drains:1300]  Labs:  Basename 05/12/12 0500  WBC 7.7  HGB 13.8  HCT 39.4  PLT 213  APTT --  INR --     Basename 05/12/12 0500 05/11/12 0825 05/10/12 0411  NA 131* 132* 134*  K 3.9 3.4* 3.7  CL 100 100 100  CO2 20 23 21   GLUCOSE 134* 131* 120*  BUN 15 16 6   CREATININE 0.79 0.87 0.95  LABCREA -- -- --  CREAT24HRUR -- -- --  CALCIUM 8.6 8.7 9.0  MG 2.2 -- --  PHOS 3.8 -- --  PROT 6.3 6.3 6.7  ALBUMIN 2.4* 2.6* 2.8*  AST 43* 36 55*  ALT 58* 64* 95*  ALKPHOS 241* 272* 337*  BILITOT 11.5* 11.2* 11.4*  BILIDIR -- -- --  IBILI -- -- --  PREALBUMIN -- -- --  TRIG -- -- 385*  CHOLHDL -- -- --  CHOL -- -- --   Estimated Creatinine Clearance: 83.1 ml/min (by C-G formula based on Cr of 0.79).    Basename 05/12/12 0557 05/12/12 0021 05/11/12 1804  GLUCAP 143* 137* 129*   Insulin Requirements in the past 24 hours:   CBG: 117/138 2 units   Nutritional Goals:  Kcal:1700-1950  Protein:70-85g  Fluid:1.7-1.9L  Current Nutrition:  CL started 9/21 AM- no po intake IVF: D5W/NS with 20 mEq KCl/liter at 55 ml/hr  Clinimix E 5/20 advanced to goal rate of 70 ml/hr + lipids at 10 ml/hr will provide: 84 g/day protein and 1958 Kcal/day MWF, 1478 Kcal/day STTHS (Avg. 1684 Kcal/day weekly).  Assessment: 66 YOM presented 9/12 w/ abdominal tightness and nausea. 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 S/P ERCP, biopsies pending. Had biliary drain  placed 9/18 and upsized 9/20. Surgery to decide regarding resection.  Electrolytes: WNL, except Na+ remains low, cannot adjust in pre-mixed TNA.  LFTs: Tbili 11.5, Alk phos down, AST down wnl, ALT down and almost wnl. Biliary drainage 1300 ml/24 hr..  SCr:  WNL TGs/Cholesterol: TG 422 (9/20), decreased to 385 on 9/21 Pre-albumin: 12.6 (9/20)  Plan:   Continue TNA formula Clinimix E 5/20% at goal rate 60ml/hr. MIVF at 36ml/hr  Triglycerides and Pre-albumin pending -   Lipids (MWF only due to shortage)  TNA to contain standard multivitamins and trace elements (MWF only due to ongoing shortage).  Continue sensitive SSI q6h.  TNA lab panels on Mondays & Thursdays.  Otho Bellows  PharmD  [phone] 05/12/2012 10:29 AM

## 2012-05-12 NOTE — Progress Notes (Signed)
Patient seen, labs reviewed, CT reviewed, case discussed with Dr. Donell Beers.  The patient is having essentially no by mouth intake.  Last bowel movement was about 5 days ago, according to the patient and his family.  His biliary drain is draining well, but his bilirubin is not coming down, even though his other liver chemistries are substantially improved.  His lipase was almost down to normal as of several days ago and has not been repeated.  Digital exam shows a moderate amount of soft stool in the rectal ampulla, no fecal impaction, no inspissated stool.  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   Impression:  1. Abdominal pain and elevated lipase, resolved, without CT findings of acute pancreatitis at time of presentation. 2. "Double duct" sign with dilatation of both pancreatic duct and common bile duct. 3. Edema/stenosis/deformity of the second portion of duodenum, preventing passage of ERCP scope, gastroscope, or EUS scope. Contrast did pass through that area on his admission CT. 4. Elevated CA 19-9 level of approximately 100, which could be due to underlying malignancy or 2 underlying pancreatic disease. His EUS did not do a good job of visualizing the periampullary region, but there were changes possibly compatible with chronic pancreatitis.  Discussion: I agree with Dr. Donell Beers that the exact underlying problem is unclear, but that we are more or less obligated to exclude malignancy, which would most likely be of the ampulla or periampullary region. I agree with her plan to do exploratory laparoscopy as a first step, to be followed by more definitive surgery at a later date when time permits.  Plan:  1. I will order a fleets enema to prevent fecal obstipation or impaction 2. Continue  nutrition support in view of inability to take significant amounts by mouth, and in light of probable upcoming surgery 3. I will defer further management to Dr. Donell Beers 4. Please call if I can be of further assistance in the care of this very pleasant patient.  Florencia Reasons, M.D. [phone]

## 2012-05-12 NOTE — Progress Notes (Signed)
Discussed potential surgery with patient and family.  Also discussed with Dr. Matthias Hughs.   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  Ca 19-9 elevated.   Pathology non diagnostic.  Need assessment of perioperative risk from cardiology for possible whipple.   May need staged surgery with diagnostic laparoscopy to rule out carcinomatosis.

## 2012-05-12 NOTE — Progress Notes (Signed)
Nutrition Follow-up  Intervention: TPN per pharmacy. Will monitor.   Diet Order: Clear liquid, minimal intake  TPN: Clinimix E 5/20 @ 70 ml/hr.  Lipids (20% IVFE @ 10 ml/hr), multivitamins, and trace elements are provided 3 times weekly (MWF) due to national backorder.  Provides 1684 kcal and 84 grams protein daily (based on weekly average).  Meets 99% minimum estimated kcal and 120% minimum estimated protein needs.  Additional IVF with NS @ 10-20 ml/hr.  - Alk phos and AST/ALT trending down - Stable total bilirubin - PALB trending down - CBGs controlled  - Triglycerides trending down  Meds: Scheduled Meds:   . ALPRAZolam  0.25 mg Oral BID  . insulin aspart  0-9 Units Subcutaneous Q6H  . pantoprazole (PROTONIX) IV  40 mg Intravenous Q1200  . piperacillin-tazobactam (ZOSYN)  IV  3.375 g Intravenous Q8H  . sodium chloride  3 mL Intravenous Q12H  . sodium phosphate  1 enema Rectal Once   Continuous Infusions:   . sodium chloride 20 mL (05/12/12 0306)  . dextrose 5 % and 0.9 % NaCl with KCl 20 mEq/L 65 mL/hr at 05/11/12 1551  . dextrose 5 % and 0.9 % NaCl with KCl 20 mEq/L 55 mL/hr at 05/12/12 1152  . TPN (CLINIMIX) +/- additives     And  . fat emulsion    . TPN (CLINIMIX) +/- additives 60 mL/hr at 05/11/12 1551  . TPN (CLINIMIX) +/- additives 70 mL/hr at 05/11/12 1815   PRN Meds:.diphenhydrAMINE, diphenhydrAMINE-zinc acetate, guaifenesin, hydrOXYzine, morphine injection, ondansetron (ZOFRAN) IV, ondansetron, oxyCODONE, sodium chloride, DISCONTD: diphenhydrAMINE  Labs:  CMP     Component Value Date/Time   NA 131* 05/12/2012 0500   K 3.9 05/12/2012 0500   CL 100 05/12/2012 0500   CO2 20 05/12/2012 0500   GLUCOSE 134* 05/12/2012 0500   BUN 15 05/12/2012 0500   CREATININE 0.79 05/12/2012 0500   CALCIUM 8.6 05/12/2012 0500   PROT 6.3 05/12/2012 0500   ALBUMIN 2.4* 05/12/2012 0500   AST 43* 05/12/2012 0500   ALT 58* 05/12/2012 0500   ALKPHOS 241* 05/12/2012 0500   BILITOT 11.5*  05/12/2012 0500   GFRNONAA >90 05/12/2012 0500   GFRAA >90 05/12/2012 0500   CBG (last 3)   Basename 05/12/12 1203 05/12/12 0557 05/12/12 0021  GLUCAP 125* 143* 137*   Prealbumin  Date Value Range Status  05/12/2012 7.5* 17.0 - 34.0 mg/dL Final      Intake/Output Summary (Last 24 hours) at 05/12/12 1601 Last data filed at 05/12/12 1400  Gross per 24 hour  Intake   2500 ml  Output   1850 ml  Net    650 ml   Biliary drain - 1,354ml total output yesterday  Last BM - 9/19  Weight Status:   9/19 153 lb 14.1 oz 9/22 160 lb 4.4 oz  Estimated needs:   1700-1950 calories 70-85g protein  Nutrition Dx:  Inadequate oral intake now r/t duodenal obstruction and pancreatitis as evidenced by clear liquid diet.   Goal: TPN to meet >90% of estimated nutritional needs.   Monitor: Weights, labs, intake, TPN, diet   Levon Hedger MS, RD, [place] [phone] Pager [phone] After Hours Pager

## 2012-05-12 NOTE — Progress Notes (Signed)
Subjective:  Feeling same. Occasional abdominal pain and itching. Afebrile. H/O stent in [place] in 2004. No chest pain. Normal duodenal biopsies.  Objective:  Vital Signs in the last 24 hours: Temp:  [97.9 F (36.6 C)-98.6 F (37 C)] 98.3 F (36.8 C) (09/23 1431) Pulse Rate:  [62-73] 63  (09/23 1431) Cardiac Rhythm:  [-]  Resp:  [18-26] 18  (09/23 1431) BP: (125-144)/(53-69) 144/61 mmHg (09/23 1431) SpO2:  [90 %-96 %] 94 % (09/23 1431)  Physical Exam: BP Readings from Last 1 Encounters:  05/12/12 144/61    Wt Readings from Last 1 Encounters:  05/11/12 72.7 kg (160 lb 4.4 oz)    Weight change:   HEENT: [place]/AT, Eyes-Brown, PERL, EOMI, Conjunctiva-Pink, Sclera-icteric Neck: No JVD, No bruit, Trachea midline. Lungs:  Clear, Bilateral. Cardiac:  Regular rhythm, normal S1 and S2, no S3.  Abdomen:  Soft, mild epigastric tenderness. + drainage tube with bile in bag. Extremities:  No edema present. No cyanosis. No clubbing. CNS: AxOx3, Cranial nerves grossly intact, moves all 4 extremities. Right handed. Skin: Warm and dry.   Intake/Output from previous day: 09/22 0701 - 09/23 0700 In: 2145.3 [I.V.:1300.3; TPN:840] Out: 2400 [Urine:1100; Drains:1300]    Lab Results: BMET    Component Value Date/Time   NA 131* 05/12/2012 0500   K 3.9 05/12/2012 0500   CL 100 05/12/2012 0500   CO2 20 05/12/2012 0500   GLUCOSE 134* 05/12/2012 0500   BUN 15 05/12/2012 0500   CREATININE 0.79 05/12/2012 0500   CALCIUM 8.6 05/12/2012 0500   GFRNONAA >90 05/12/2012 0500   GFRAA >90 05/12/2012 0500   CBC    Component Value Date/Time   WBC 7.7 05/12/2012 0500   RBC 4.85 05/12/2012 0500   HGB 13.8 05/12/2012 0500   HCT 39.4 05/12/2012 0500   PLT 213 05/12/2012 0500   MCV 81.2 05/12/2012 0500   MCH 28.5 05/12/2012 0500   MCHC 35.0 05/12/2012 0500   RDW 16.1* 05/12/2012 0500   LYMPHSABS 1.5 05/12/2012 0500   MONOABS 0.7 05/12/2012 0500   EOSABS 0.5 05/12/2012 0500   BASOSABS 0.0 05/12/2012 0500   CARDIAC  ENZYMES Lab Results  Component Value Date   TROPONINI <0.30 05/01/2012    Assessment/Plan:  Patient Active Hospital Problem List:  Acute Pancreatitis probably due to GB stone  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

## 2012-05-12 NOTE — Progress Notes (Signed)
5 Days Post-Op  Subjective: Having nausea with PO's some discomfort RUQ.   Objective: Vital signs in last 24 hours: Temp:  [97.5 F (36.4 C)-98.6 F (37 C)] 98.6 F (37 C) (09/23 0655) Pulse Rate:  [59-73] 62  (09/23 0655) Resp:  [20-29] 20  (09/23 0655) BP: (125-157)/(53-77) 138/53 mmHg (09/23 0655) SpO2:  [90 %-96 %] 96 % (09/23 0655) Last BM Date: 05/08/12  Diet: clear liquids, TNA support, afebrile, VSS, LFT's show increasing bilirubin despite, drain, no labs this  AM  Intake/Output from previous day: 09/22 0701 - 09/23 0700 In: 2145.3 [I.V.:1300.3; TPN:840] Out: 2400 [Urine:1100; Drains:1300] Intake/Output this shift:    General appearance: alert, cooperative and no distress GI: soft, tender in RUQ, drain shows normal green bilious drainage.,   Lab Results:   Basename 05/12/12 0500  WBC 7.7  HGB 13.8  HCT 39.4  PLT 213    BMET  Basename 05/12/12 0500 05/11/12 0825  NA 131* 132*  K 3.9 3.4*  CL 100 100  CO2 20 23  GLUCOSE 134* 131*  BUN 15 16  CREATININE 0.79 0.87  CALCIUM 8.6 8.7   PT/INR No results found for this basename: LABPROT:2,INR:2 in the last 72 hours   Lab 05/12/12 0500 05/11/12 0825 05/10/12 0411 05/09/12 0440 05/08/12 0441  AST 43* 36 55* 80* 90*  ALT 58* 64* 95* 127* 131*  ALKPHOS 241* 272* 337* 354* 273*  BILITOT 11.5* 11.2* 11.4* 12.2* 10.6*  PROT 6.3 6.3 6.7 7.2 5.7*  ALBUMIN 2.4* 2.6* 2.8* 3.2* 2.4*     Lipase     Component Value Date/Time   LIPASE 102* 05/08/2012 0440     Studies/Results: No results found.  Medications:    . ALPRAZolam  0.25 mg Oral BID  . insulin aspart  0-9 Units Subcutaneous Q6H  . pantoprazole (PROTONIX) IV  40 mg Intravenous Q1200  . piperacillin-tazobactam (ZOSYN)  IV  3.375 g Intravenous Q8H  . potassium chloride  10 mEq Intravenous Q1 Hr x 2  . 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 S/p placement of biliary drain thru right posterior biliary duct with tip coiled in distal common bile duct. 05/07/12 IR. Hypertension Dyslipidemia Tobacco use   Plan:  Still waiting on pathology. He does have a history of prior stents, 2004, he does not think he has had a recent stress test.  If he is resectable, he will need cardiac clearance. Will defer to Dr. Algie Coffer. Dr. Donell Beers will see later today.  Daughter and son are present and help with language issues.    LOS: 11 days    Colin Castro 05/12/2012

## 2012-05-13 ENCOUNTER — Inpatient Hospital Stay (HOSPITAL_COMMUNITY): Payer: Medicaid Other

## 2012-05-13 LAB — COMPREHENSIVE METABOLIC PANEL
AST: 65 U/L — ABNORMAL HIGH (ref 0–37)
Albumin: 2.5 g/dL — ABNORMAL LOW (ref 3.5–5.2)
CO2: 20 mEq/L (ref 19–32)
Calcium: 8.9 mg/dL (ref 8.4–10.5)
Creatinine, Ser: 0.82 mg/dL (ref 0.50–1.35)
GFR calc non Af Amer: >90 mL/min (ref >90)

## 2012-05-13 LAB — GLUCOSE, CAPILLARY: Glucose-Capillary: 155 mg/dL — ABNORMAL HIGH (ref 70–99)

## 2012-05-13 MED ORDER — CLINIMIX E/DEXTROSE (5/15) 5 % IV SOLN
INTRAVENOUS | Status: AC
  Administered 2012-05-13: 18:00:00 via INTRAVENOUS
  Filled 2012-05-13: qty 2000

## 2012-05-13 MED ORDER — FUROSEMIDE 10 MG/ML IJ SOLN
20.0000 mg | Freq: Once | INTRAMUSCULAR | Status: AC
  Administered 2012-05-13: 20 mg via INTRAVENOUS
  Filled 2012-05-13: qty 2

## 2012-05-13 NOTE — Progress Notes (Signed)
PARENTERAL NUTRITION CONSULT NOTE - Follow Up  Pharmacy Consult for TNA Indication: pancreatitis, duodenal obstruction  No Known Allergies  Patient Measurements: Height: 5\' 6"  (167.6 cm) (pt. stated) Weight: 158 lb 15.2 oz (72.1 kg) (bed scale) IBW/kg (Calculated) : 63.8   Vital Signs: Temp: 98 F (36.7 C) (09/24 0627) Temp src: Oral (09/24 0627) BP: 123/66 mmHg (09/24 0627) Pulse Rate: 76  (09/24 0627) Intake/Output from previous day: 09/23 0701 - 09/24 0700 In: 3436.7 [I.V.:1265; IV Piggyback:450; TPN:1721.7] Out: 550 [Urine:200; Drains:350] Labs:  [facility] 05/12/12 0500  WBC 7.7  HGB 13.8  HCT 39.4  PLT 213  APTT --  INR --    Basename 05/13/12 0520 05/12/12 0500 05/11/12 0825  NA 127* 131* 132*  K 4.1 3.9 3.4*  CL 95* 100 100  CO2 20 20 23   GLUCOSE 132* 134* 131*  BUN 15 15 16   CREATININE 0.82 0.79 0.87  LABCREA -- -- --  CREAT24HRUR -- -- --  CALCIUM 8.9 8.6 8.7  MG -- 2.2 --  PHOS -- 3.8 --  PROT 7.1 6.3 6.3  ALBUMIN 2.5* 2.4* 2.6*  AST 65* 43* 36  ALT 76* 58* 64*  ALKPHOS 254* 241* 272*  BILITOT 11.7* 11.5* 11.2*  BILIDIR -- -- --  IBILI -- -- --  PREALBUMIN -- 7.5* --  TRIG -- 237* --  CHOLHDL -- -- --  CHOL -- 156 --   Estimated Creatinine Clearance: 81 ml/min (by C-G formula based on Cr of 0.82).    Basename 05/13/12 0609 05/13/12 0017 05/12/12 1747  GLUCAP 152* 155* 125*   Insulin Requirements in the past 24 hours:   CBG: 117-143, 150's this am 0 units yesterday, 4 units this am.   Nutritional Goals:  Kcal:1700-1950  Protein:70-85g  Fluid:1.7-1.9L  Current Nutrition:  CL started 9/21 AM- little po intake IVF: D5W/NS with 20 mEq KCl/liter at 55 ml/hr  Clinimix E 5/20 advanced to goal rate of 70 ml/hr + lipids at 10 ml/hr will provide: 84 g/day protein and 1958 Kcal/day MWF, 1478 Kcal/day STTHS (Avg. 1684 Kcal/day weekly).  Assessment: 66 YOM presented 9/12 w/ abdominal tightness and nausea. 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 S/P ERCP, biopsies pending. Had biliary drain placed 9/18 and upsized 9/20. Surgery to decide regarding resection. Noted that if LFT's do not decrease further; likely due to hepatic dysfx, drainage decreased.  Electrolytes: WNL, except Na+ remains low, cannot adjust in pre-mixed TNA.  LFTs: Tbili 11.7, Alk phos, AST, ALT decreased, but now trending up . Biliary drainage 350 ml/24 hr, decreased. SCr:  WNL TGs/Cholesterol: TG 422 (9/20) -> 385 on 9/21-> 237 yesterday Pre-albumin: 12.6 (9/20)-> 7.5 yesterday  Plan:   Change TNA formula back to Clinimix E 5/15% at goal rate 22ml/hr. MIVF at 66ml/hr  Monitor LFT's  Lipids (MWF only due to shortage)  TNA to contain standard multivitamins and trace elements (MWF only due to ongoing shortage).  Continue sensitive SSI q6h, consider insulin change if CBG's continue to increase.  TNA lab panels on Mondays & Thursdays.  CMET tomorrow.  Otho Bellows  PharmD  [phone] 05/13/2012 10:25 AM

## 2012-05-13 NOTE — Progress Notes (Signed)
Good results from yesterday's Fleet enema, per discussion with family.  I would recommend consideration of a suppository or an enema roughly once a week until such time as the patient is taking oral nutrition again, to prevent fecal obstipation.  I will plan to sign off at this time, but would be happy to see the patient again at your request, if we can be of further assistance in his care.  Florencia Reasons, M.D. [phone]

## 2012-05-13 NOTE — Progress Notes (Addendum)
Chaplain rounded on pt due to length of stay.   Spoke with family of pt, who are in town from [place].  Family expressed some anxiety around pt having surgery while they are present and able to speak with physicians.   Uncertain how long they may be able to stay due to need to return to work.     Pt speaks Hindi and Gujarati.   Family concerned about translation.  Chaplain spoke with pt's family about possibility of translator services for pt via phone.     Pt expressed thankfulness for care of pt and expressed no further needs.  Chaplain will follow as needed.        Belva Crome  MDiv, Chaplain

## 2012-05-13 NOTE — Progress Notes (Addendum)
Patient ID: Colin Castro, male   DOB: Sep 12, 1945, 66 y.o.   MRN: [id number] Per order from Dr. Donell Beers, pt's I/E biliary drain was capped. Pt still has sig abd  discomfort despite pain meds. VSS/AF. The drain was flushed with 10 cc's sterile NS without difficulty prior to capping. No pericatheter leaking was noted. Will need to monitor closely for worsening abd pain, spike in bilirubin, nausea, fever and pericatheter leaking. If pt  fails capping will place back to bag drainage, consider f/u imaging.  Further plans per CCS.

## 2012-05-13 NOTE — Progress Notes (Signed)
Subjective:  Abdominal pain continues. No fever. Biliary drain capped today.  Objective:  Vital Signs in the last 24 hours: Temp:  [97.5 F (36.4 C)-98 F (36.7 C)] 97.5 F (36.4 C) (09/24 1429) Pulse Rate:  [70-77] 77  (09/24 1429) Cardiac Rhythm:  [-]  Resp:  [18] 18  (09/24 1429) BP: (123-147)/(66-76) 145/76 mmHg (09/24 1429) SpO2:  [92 %-94 %] 93 % (09/24 1429) Weight:  [72.1 kg (158 lb 15.2 oz)] 72.1 kg (158 lb 15.2 oz) (09/24 3086)  Physical Exam: BP Readings from Last 1 Encounters:  05/13/12 145/76    Wt Readings from Last 1 Encounters:  05/13/12 72.1 kg (158 lb 15.2 oz)    Weight change:   HEENT: [place]/AT, Eyes-Brown, PERL, EOMI, Conjunctiva-Pink, Sclera-Non-icteric Neck: No JVD, No bruit, Trachea midline. Lungs:  Clear, Bilateral. Cardiac:  Regular rhythm, normal S1 and S2, no S3.  Abdomen:  Soft, moderate-tender all over. Extremities:  No edema present. No cyanosis. No clubbing. CNS: AxOx3, Cranial nerves grossly intact, moves all 4 extremities. Right handed. Skin: Warm and dry.   Intake/Output from previous day: 09/23 0701 - 09/24 0700 In: 3436.7 [I.V.:1265; IV Piggyback:450; TPN:1721.7] Out: 550 [Urine:200; Drains:350]    Lab Results: BMET    Component Value Date/Time   NA 127* 05/13/2012 0520   K 4.1 05/13/2012 0520   CL 95* 05/13/2012 0520   CO2 20 05/13/2012 0520   GLUCOSE 132* 05/13/2012 0520   BUN 15 05/13/2012 0520   CREATININE 0.82 05/13/2012 0520   CALCIUM 8.9 05/13/2012 0520   GFRNONAA >90 05/13/2012 0520   GFRAA >90 05/13/2012 0520   CBC    Component Value Date/Time   WBC 7.7 05/12/2012 0500   RBC 4.85 05/12/2012 0500   HGB 13.8 05/12/2012 0500   HCT 39.4 05/12/2012 0500   PLT 213 05/12/2012 0500   MCV 81.2 05/12/2012 0500   MCH 28.5 05/12/2012 0500   MCHC 35.0 05/12/2012 0500   RDW 16.1* 05/12/2012 0500   LYMPHSABS 1.5 05/12/2012 0500   MONOABS 0.7 05/12/2012 0500   EOSABS 0.5 05/12/2012 0500   BASOSABS 0.0 05/12/2012 0500   CARDIAC ENZYMES Lab  Results  Component Value Date   TROPONINI <0.30 05/01/2012    Assessment/Plan:  Patient Active Hospital Problem List:  Acute Pancreatitis probably due to GB stone  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 Volume overlaod  Nuclear stress test in AM Small dose lasix X-ray abdomen   LOS: 12 days    Orpah Cobb  MD  05/13/2012, 9:09 PM

## 2012-05-13 NOTE — Progress Notes (Signed)
6 Days Post-Op  Subjective: Pt with some itching and abd pain.  Pt receiving pain meds as needed, and doing a little better.   Objective: Vital signs in last 24 hours: Temp:  [97.9 F (36.6 C)-98.3 F (36.8 C)] 98 F (36.7 C) (09/24 0627) Pulse Rate:  [63-76] 76  (09/24 0627) Resp:  [18] 18  (09/24 0627) BP: (123-147)/(61-75) 123/66 mmHg (09/24 0627) SpO2:  [92 %-94 %] 92 % (09/24 0627) Weight:  [158 lb 15.2 oz (72.1 kg)] 158 lb 15.2 oz (72.1 kg) (09/24 0627) Last BM Date: 05/08/12  Intake/Output from previous day: 09/23 0701 - 09/24 0700 In: 3436.7 [I.V.:1265; IV Piggyback:450; TPN:1721.7] Out: 550 [Urine:200; Drains:350] Intake/Output this shift:    General appearance: alert and cooperative GI: soft, non-tender; bowel sounds normal; no masses,  no organomegaly  Lab Results:   Basename 05/12/12 0500  WBC 7.7  HGB 13.8  HCT 39.4  PLT 213   BMET  Basename 05/13/12 0520 05/12/12 0500  NA 127* 131*  K 4.1 3.9  CL 95* 100  CO2 20 20  GLUCOSE 132* 134*  BUN 15 15  CREATININE 0.82 0.79  CALCIUM 8.9 8.6   PT/INR No results found for this basename: LABPROT:2,INR:2 in the last 72 hours ABG No results found for this basename: PHART:2,PCO2:2,PO2:2,HCO3:2 in the last 72 hours  Studies/Results: No results found.  Anti-infectives: Anti-infectives     Start     Dose/Rate Route Frequency Ordered Stop   05/04/12 1100   piperacillin-tazobactam (ZOSYN) IVPB 3.375 g        3.375 g 12.5 mL/hr over 240 Minutes Intravenous Every 8 hours 05/04/12 1013     05/04/12 1000   piperacillin-tazo (ZOSYN) NICU IV syringe 200 mg/mL  Status:  Discontinued        75 mg/kg  70.4 kg 52.8 mL/hr over 30 Minutes Intravenous Every 8 hours 05/04/12 0947 05/04/12 1011          Assessment/Plan: s/p Procedure(s) (LRB) with comments: ENDOSCOPIC RETROGRADE CHOLANGIOPANCREATOGRAPHY (ERCP) (N/A) - Dr [name] UPPER ENDOSCOPIC ULTRASOUND (EUS) RADIAL ()  Con't pain medication as  needed. Cardiology stress test scheduled for AM. Will discuss the family's concerns with Dr. Donell Beers    LOS: 12 days    Marigene Ehlers., [facility] 05/13/2012

## 2012-05-13 NOTE — Progress Notes (Signed)
6 Days Post-Op  Subjective: Pt still with abd pain /fullness and poor oral intake; had BM with enema; family in room  Objective: Vital signs in last 24 hours: Temp:  [97.9 F (36.6 C)-98.3 F (36.8 C)] 98 F (36.7 C) (09/24 0627) Pulse Rate:  [63-76] 76  (09/24 0627) Resp:  [18] 18  (09/24 0627) BP: (123-147)/(61-75) 123/66 mmHg (09/24 0627) SpO2:  [92 %-94 %] 92 % (09/24 0627) Weight:  [158 lb 15.2 oz (72.1 kg)] 158 lb 15.2 oz (72.1 kg) (09/24 0627) Last BM Date: 05/08/12  Intake/Output from previous day: 09/23 0701 - 09/24 0700 In: 3436.7 [I.V.:1265; IV Piggyback:450; TPN:1721.7] Out: 550 [Urine:200; Drains:350] Intake/Output this shift:    I/E biliary drain intact, insertion site ok and without leaking; output 350 cc's today; no hemobilia; drain flushed with 10 cc's sterile NS without difficulty.  t bili 11.7 (11.5)  Lab Results:   Basename 05/12/12 0500  WBC 7.7  HGB 13.8  HCT 39.4  PLT 213   BMET  Basename 05/13/12 0520 05/12/12 0500  NA 127* 131*  K 4.1 3.9  CL 95* 100  CO2 20 20  GLUCOSE 132* 134*  BUN 15 15  CREATININE 0.82 0.79  CALCIUM 8.9 8.6   PT/INR No results found for this basename: LABPROT:2,INR:2 in the last 72 hours ABG No results found for this basename: PHART:2,PCO2:2,PO2:2,HCO3:2 in the last 72 hours No results found for this or any previous visit.  Studies/Results: No results found.  Anti-infectives: Anti-infectives     Start     Dose/Rate Route Frequency Ordered Stop   05/04/12 1100   piperacillin-tazobactam (ZOSYN) IVPB 3.375 g        3.375 g 12.5 mL/hr over 240 Minutes Intravenous Every 8 hours 05/04/12 1013     05/04/12 1000   piperacillin-tazo (ZOSYN) NICU IV syringe 200 mg/mL  Status:  Discontinued        75 mg/kg  70.4 kg 52.8 mL/hr over 30 Minutes Intravenous Every 8 hours 05/04/12 0947 05/04/12 1011          Assessment/Plan: s/p PTC 9/18 with conversion to I/E biliary drain 9/20 secondary to ? malignant distal  CBD/ ampullary obstruction; send bile for cx/cytology; cont drain flushes; monitor labs; cardiac nuc study scheduled for 9/25 in anticipation of probable surgery (lap/? Whipple) with Dr. Donell Beers   LOS: 12 days    ALLRED,D [facility] 05/13/2012

## 2012-05-14 ENCOUNTER — Inpatient Hospital Stay (HOSPITAL_COMMUNITY)
Admit: 2012-05-14 | Discharge: 2012-05-14 | Disposition: A | Discharge status: Home / Self Care | Payer: Medicaid Other | Attending: Cardiovascular Disease | Admitting: Cardiovascular Disease

## 2012-05-14 ENCOUNTER — Ambulatory Visit (HOSPITAL_COMMUNITY)
Admit: 2012-05-14 | Discharge: 2012-05-14 | Disposition: A | Discharge status: Home / Self Care | Payer: Medicaid Other | Attending: Cardiovascular Disease | Admitting: Cardiovascular Disease

## 2012-05-14 LAB — GLUCOSE, CAPILLARY
Glucose-Capillary: 109 mg/dL — ABNORMAL HIGH (ref 70–99)
Glucose-Capillary: 122 mg/dL — ABNORMAL HIGH (ref 70–99)
Glucose-Capillary: 138 mg/dL — ABNORMAL HIGH (ref 70–99)
Glucose-Capillary: 152 mg/dL — ABNORMAL HIGH (ref 70–99)

## 2012-05-14 LAB — COMPREHENSIVE METABOLIC PANEL
AST: 89 U/L — ABNORMAL HIGH (ref 0–37)
Albumin: 2.7 g/dL — ABNORMAL LOW (ref 3.5–5.2)
Calcium: 9.3 mg/dL (ref 8.4–10.5)
Chloride: 93 mEq/L — ABNORMAL LOW (ref 96–112)
Creatinine, Ser: 1.04 mg/dL (ref 0.50–1.35)
Total Bilirubin: 11.3 mg/dL — ABNORMAL HIGH (ref 0.3–1.2)
Total Protein: 7.5 g/dL (ref 6.0–8.3)

## 2012-05-14 LAB — CBC
MCV: 80.8 fL (ref 78.0–100.0)
Platelets: 224 10*3/uL (ref 150–400)
RDW: 16.5 % — ABNORMAL HIGH (ref 11.5–15.5)
WBC: 11 10*3/uL — ABNORMAL HIGH (ref 4.0–10.5)

## 2012-05-14 MED ORDER — REGADENOSON 0.4 MG/5ML IV SOLN
0.4000 mg | Freq: Once | INTRAVENOUS | Status: AC
  Administered 2012-05-14: 0.4 mg via INTRAVENOUS

## 2012-05-14 MED ORDER — REGADENOSON 0.4 MG/5ML IV SOLN
INTRAVENOUS | Status: AC
  Administered 2012-05-14: 0.4 mg via INTRAVENOUS
  Filled 2012-05-14: qty 5

## 2012-05-14 MED ORDER — TECHNETIUM TC 99M SESTAMIBI GENERIC - CARDIOLITE
30.0000 | Freq: Once | INTRAVENOUS | Status: AC | PRN
  Administered 2012-05-14: 30 via INTRAVENOUS

## 2012-05-14 MED ORDER — TECHNETIUM TC 99M SESTAMIBI GENERIC - CARDIOLITE
10.0000 | Freq: Once | INTRAVENOUS | Status: AC | PRN
  Administered 2012-05-14: 10 via INTRAVENOUS

## 2012-05-14 MED ORDER — TECHNETIUM TC 99M SESTAMIBI - CARDIOLITE
30.0000 | Freq: Once | INTRAVENOUS | Status: AC | PRN
  Administered 2012-05-14: 30 via INTRAVENOUS

## 2012-05-14 MED ORDER — DEXTROSE-NACL 5-0.9 % IV SOLN
INTRAVENOUS | Status: DC
  Administered 2012-05-14 – 2012-05-15 (×2): via INTRAVENOUS

## 2012-05-14 MED ORDER — ZINC TRACE METAL 1 MG/ML IV SOLN
INTRAVENOUS | Status: AC
  Administered 2012-05-14: 17:00:00 via INTRAVENOUS
  Filled 2012-05-14 (×2): qty 2000

## 2012-05-14 MED ORDER — FAT EMULSION 20 % IV EMUL
240.0000 mL | INTRAVENOUS | Status: AC
  Administered 2012-05-14: 240 mL via INTRAVENOUS
  Filled 2012-05-14 (×4): qty 250

## 2012-05-14 NOTE — Progress Notes (Signed)
Try on full liquids.  Will likely plan diagnostic laparoscopy this week.  If metastatic disease seen, can do gastrojejunostomy.  If no metastatic disease, would book for whipple.

## 2012-05-14 NOTE — Progress Notes (Signed)
7 Days Post-Op  Subjective: No complaints  Objective: Vital signs in last 24 hours: Temp:  [97.7 F (36.5 C)-98.4 F (36.9 C)] 97.7 F (36.5 C) (09/25 1441) Pulse Rate:  [74-83] 74  (09/25 1441) Resp:  [17-19] 19  (09/25 0550) BP: (84-148)/(30-71) 123/52 mmHg (09/25 1441) SpO2:  [93 %-95 %] 95 % (09/25 1441) Weight:  [65.227 kg (143 lb 12.8 oz)-71.8 kg (158 lb 4.6 oz)] 65.227 kg (143 lb 12.8 oz) (09/25 1237) Last BM Date: 05/12/12  Intake/Output from previous day:   Intake/Output this shift:    General appearance: alert, cooperative and no distress GI: soft, midly tender, large lower mid abd scar, family say it was appendix/  Lab Results:   Basename 05/14/12 0545 05/12/12 0500  WBC 11.0* 7.7  HGB 15.6 13.8  HCT 43.4 39.4  PLT 224 213    BMET  Basename 05/14/12 0545 2012-06-10 0520  NA 128* 127*  K 5.2* 4.1  CL 93* 95*  CO2 21 20  GLUCOSE 105* 132*  BUN 18 15  CREATININE 1.04 0.82  CALCIUM 9.3 8.9   PT/INR No results found for this basename: LABPROT:2,INR:2 in the last 72 hours   Lab 05/14/12 0545 Jun 10, 2012 0520 05/12/12 0500 05/11/12 0825 05/10/12 0411  AST 89* 65* 43* 36 55*  ALT 90* 76* 58* 64* 95*  ALKPHOS 247* 254* 241* 272* 337*  BILITOT 11.3* 11.7* 11.5* 11.2* 11.4*  PROT 7.5 7.1 6.3 6.3 6.7  ALBUMIN 2.7* 2.5* 2.4* 2.6* 2.8*     Lipase     Component Value Date/Time   LIPASE 102* 05/08/2012 0440     Studies/Results: Dg Abd 2 Views  10-Jun-2012  *RADIOLOGY REPORT*  Clinical Data: 66 year old male abdominal pain and distention. Percutaneous biliary drain.  ABDOMEN - 2 VIEW  Comparison: 05/09/2012 and earlier.  Findings: No pneumoperitoneum on the left side down lateral decubitus views.  Percutaneous transhepatic biliary drain re- identified.  Increased small and large bowel gas, but no abnormally dilated loops are identified and gas is present to the level of the rectum. Stable visualized osseous structures.  IMPRESSION: 1. Increased bowel gas which  might reflect ileus, but no evidence of mechanical obstruction.  No free air. 2.  Transhepatic biliary drain in place.   Original Report Authenticated By: Harley Hallmark, M.D.     Medications:    . ALPRAZolam  0.25 mg Oral BID  . furosemide  20 mg Intravenous Once  . insulin aspart  0-9 Units Subcutaneous Q6H  . pantoprazole (PROTONIX) IV  40 mg Intravenous Q1200  . piperacillin-tazobactam (ZOSYN)  IV  3.375 g Intravenous Q8H  . regadenoson  0.4 mg Intravenous Once  . 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  S/p placement of biliary drain thru right posterior biliary duct with tip coiled in distal common bile duct. 05/07/12 IR.  Hypertension  Dyslipidemia  Tobacco use  Plan:  Await Stress test, and plan exploratory lap for later this week if OK.    LOS: 13 days    Colin Castro 05/14/2012

## 2012-05-14 NOTE — Progress Notes (Signed)
Subjective: Pt back from NM stress test. Feels ok. Reports slightly less pain today Denies any known issues with drain.  Objective: Physical Exam: BP 123/52  Pulse 74  Temp 97.7 F (36.5 C) (Oral)  Resp 19  Ht 5\' 6"  (1.676 m)  Wt 143 lb 12.8 oz (65.227 kg)  BMI 23.21 kg/m2  SpO2 95% Drain intact, site clean. No evidence of leaking. No tenderness at site    Labs: CBC  Basename 05/14/12 0545 05/12/12 0500  WBC 11.0* 7.7  HGB 15.6 13.8  HCT 43.4 39.4  PLT 224 213   BMET  Basename 05/14/12 0545 May 28, 2012 0520  NA 128* 127*  K 5.2* 4.1  CL 93* 95*  CO2 21 20  GLUCOSE 105* 132*  BUN 18 15  CREATININE 1.04 0.82  CALCIUM 9.3 8.9   LFT  Basename 05/14/12 0545  PROT 7.5  ALBUMIN 2.7*  AST 89*  ALT 90*  ALKPHOS 247*  BILITOT 11.3*  BILIDIR --  IBILI --  LIPASE --   PT/INR No results found for this basename: LABPROT:2,INR:2 in the last 72 hours   Studies/Results: Dg Abd 2 Views  05-28-2012  *RADIOLOGY REPORT*  Clinical Data: 66 year old male abdominal pain and distention. Percutaneous biliary drain.  ABDOMEN - 2 VIEW  Comparison: 05/09/2012 and earlier.  Findings: No pneumoperitoneum on the left side down lateral decubitus views.  Percutaneous transhepatic biliary drain re- identified.  Increased small and large bowel gas, but no abnormally dilated loops are identified and gas is present to the level of the rectum. Stable visualized osseous structures.  IMPRESSION: 1. Increased bowel gas which might reflect ileus, but no evidence of mechanical obstruction.  No free air. 2.  Transhepatic biliary drain in place.   Original Report Authenticated By: Harley Hallmark, M.D.     Assessment/Plan: s/p PTC 9/18 with conversion to I/E biliary drain 9/20 secondary to ? malignant distal CBD/ ampullary obstruction. Drain capped late yesterday(9/24), tolerating well so far Bili slightly down to 11.3 Cardiac clearance in progress for anticipated surgical intervention. Will cont  to follow along.     LOS: 13 days    Brayton El PA-C 05/14/2012 2:55 PM

## 2012-05-14 NOTE — Progress Notes (Addendum)
Subjective:  Feeling better. No reversible ischemia on nuclear stress test with 56 % EF. Afebrile.  Objective:  Vital Signs in the last 24 hours: Temp:  [97.7 F (36.5 C)-98.4 F (36.9 C)] 97.7 F (36.5 C) (09/25 1441) Pulse Rate:  [74-83] 74  (09/25 1441) Cardiac Rhythm:  [-]  Resp:  [17-19] 19  (09/25 0550) BP: (84-148)/(30-71) 123/52 mmHg (09/25 1441) SpO2:  [93 %-95 %] 95 % (09/25 1441) Weight:  [65.227 kg (143 lb 12.8 oz)-71.8 kg (158 lb 4.6 oz)] 65.227 kg (143 lb 12.8 oz) (09/25 1237)  Physical Exam: BP Readings from Last 1 Encounters:  05/14/12 123/52    Wt Readings from Last 1 Encounters:  05/14/12 65.227 kg (143 lb 12.8 oz)    Weight change: -0.3 kg (-10.6 oz)  HEENT: [place]/AT, Eyes-Brown, PERL, EOMI, Conjunctiva-Pink, Sclera-icteric Neck: No JVD, No bruit, Trachea midline. Lungs:  Clear, Bilateral. Cardiac:  Regular rhythm, normal S1 and S2, no S3.  Abdomen:  Soft, mildly-tender all over. Extremities:  No edema present. No cyanosis. No clubbing. CNS: AxOx3, Cranial nerves grossly intact, moves all 4 extremities. Right handed. Skin: Warm and dry.   Intake/Output from previous day:      Lab Results: BMET    Component Value Date/Time   NA 128* 05/14/2012 0545   K 5.2* 05/14/2012 0545   CL 93* 05/14/2012 0545   CO2 21 05/14/2012 0545   GLUCOSE 105* 05/14/2012 0545   BUN 18 05/14/2012 0545   CREATININE 1.04 05/14/2012 0545   CALCIUM 9.3 05/14/2012 0545   GFRNONAA 73* 05/14/2012 0545   GFRAA 85* 05/14/2012 0545   CBC    Component Value Date/Time   WBC 11.0* 05/14/2012 0545   RBC 5.37 05/14/2012 0545   HGB 15.6 05/14/2012 0545   HCT 43.4 05/14/2012 0545   PLT 224 05/14/2012 0545   MCV 80.8 05/14/2012 0545   MCH 29.1 05/14/2012 0545   MCHC 35.9 05/14/2012 0545   RDW 16.5* 05/14/2012 0545   LYMPHSABS 1.5 05/12/2012 0500   MONOABS 0.7 05/12/2012 0500   EOSABS 0.5 05/12/2012 0500   BASOSABS 0.0 05/12/2012 0500   CARDIAC ENZYMES Lab Results  Component Value Date   TROPONINI <0.30 05/01/2012    Assessment/Plan:  Patient Active Hospital Problem List: Acute Pancreatitis probably due to GB stone  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 Volume overlaod Hyperkalemia  Change IV fluid to D5NS. May undergo surgery if needed. Follow with GI and Surgery. ? Repeat abdominal scan v/s exploration if bilirubin continues to improve.   LOS: 13 days    Orpah Cobb  MD  05/14/2012, 6:45 PM

## 2012-05-14 NOTE — Progress Notes (Addendum)
PARENTERAL NUTRITION CONSULT NOTE - Follow Up  Pharmacy Consult for TNA Indication: pancreatitis, duodenal obstruction  No Known Allergies  Patient Measurements: Height: 5\' 6"  (167.6 cm) (pt. stated) Weight: 158 lb 4.6 oz (71.8 kg) IBW/kg (Calculated) : 63.8   Vital Signs: Temp: 98.4 F (36.9 C) (09/25 0550) Temp src: Oral (09/25 0550) BP: 110/54 mmHg (09/25 0550) Pulse Rate: 83  (09/25 0550) Intake/Output from previous day:   Labs:  Basename 05/14/12 0545 05/12/12 0500  WBC 11.0* 7.7  HGB 15.6 13.8  HCT 43.4 39.4  PLT 224 213  APTT -- --  INR -- --    Basename 05/14/12 0545 05/13/12 0520 05/12/12 0500  NA 128* 127* 131*  K 5.2* 4.1 3.9  CL 93* 95* 100  CO2 21 20 20   GLUCOSE 105* 132* 134*  BUN 18 15 15   CREATININE 1.04 0.82 0.79  LABCREA -- -- --  CREAT24HRUR -- -- --  CALCIUM 9.3 8.9 8.6  MG -- -- 2.2  PHOS -- -- 3.8  PROT 7.5 7.1 6.3  ALBUMIN 2.7* 2.5* 2.4*  AST 89* 65* 43*  ALT 90* 76* 58*  ALKPHOS 247* 254* 241*  BILITOT 11.3* 11.7* 11.5*  BILIDIR -- -- --  IBILI -- -- --  PREALBUMIN -- -- 7.5*  TRIG -- -- 237*  CHOLHDL -- -- --  CHOL -- -- 156   Estimated Creatinine Clearance: 63.9 ml/min (by C-G formula based on Cr of 1.04).    Basename 05/14/12 0414 05/14/12 0230 05/14/12 0031  GLUCAP 107* 104* 122*   Insulin Requirements in the past 24 hours:   CBG: 104-152 SSI: 3 units SSI in past 24 hours  Nutritional Goals:  Kcal:1700-1950  Protein:70-85g  Fluid:1.7-1.9L  Current Nutrition:  CL started 9/21 AM- little po intake IVF: D5W/NS with 20 mEq KCl/liter at 55 ml/hr  Clinimix E 5/20 advanced to goal rate of 70 ml/hr + lipids at 10 ml/hr will provide: 84 g/day protein and 1958 Kcal/day MWF, 1478 Kcal/day STTHS (Avg. 1684 Kcal/day weekly).  Assessment:  66 YOM presented 9/12 w/ abdominal tightness and nausea. 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 S/P ERCP, biopsies pending. Had biliary drain placed 9/18 and upsized  9/20. Surgery to decide regarding resection.  Elevated LFT's noted, likely chronic hepatic dysfunction and not directly related to use of TNA. TNA currently with decreased dextrose concentration.  Will Monitor AST/ALT.    Electrolytes: WNL, except Na+ remains low, cannot adjust in pre-mixed TNA.  LFTs: dysfunction SCr:  WNL TGs/Cholesterol: TG 422 (9/20) -> now reduced to 237 (9/25) Pre-albumin: 12.6 (9/20)-> 7.5 (9/23)  Plan:   Continue Clinimix E 5/15% at goal rate 49ml/hr.   MIVF at 5ml/hr - MD consider removing KCL from MIVF given K 5.2 today, will f/u  Monitor LFT's  Lipids (MWF only due to shortage)  TNA to contain standard multivitamins and trace elements (MWF only due to ongoing shortage).  Continue sensitive SSI q6h  TNA lab panels on Mondays & Thursdays.  CMET tomorrow.  Camila Maita, Loma Messing PharmD Pager #: [phone] 7:59 AM 05/14/2012

## 2012-05-15 LAB — GLUCOSE, CAPILLARY
Glucose-Capillary: 148 mg/dL — ABNORMAL HIGH (ref 70–99)
Glucose-Capillary: 125 mg/dL — ABNORMAL HIGH (ref 70–99)
Glucose-Capillary: 132 mg/dL — ABNORMAL HIGH (ref 70–99)

## 2012-05-15 LAB — COMPREHENSIVE METABOLIC PANEL
ALT: 88 U/L — ABNORMAL HIGH (ref 0–53)
Alkaline Phosphatase: 211 U/L — ABNORMAL HIGH (ref 39–117)
BUN: 16 mg/dL (ref 6–23)
CO2: 20 mEq/L (ref 19–32)
Chloride: 94 mEq/L — ABNORMAL LOW (ref 96–112)
GFR calc Af Amer: >90 mL/min (ref >90)
GFR calc non Af Amer: 88 mL/min — ABNORMAL LOW (ref >90)
Glucose, Bld: 122 mg/dL — ABNORMAL HIGH (ref 70–99)
Potassium: 4.2 mEq/L (ref 3.5–5.1)
Sodium: 125 mEq/L — ABNORMAL LOW (ref 135–145)
Total Bilirubin: 8.3 mg/dL — ABNORMAL HIGH (ref 0.3–1.2)

## 2012-05-15 LAB — MAGNESIUM: Magnesium: 2.3 mg/dL (ref 1.5–2.5)

## 2012-05-15 MED ORDER — HYDROMORPHONE HCL PF 1 MG/ML IJ SOLN
1.0000 mg | INTRAMUSCULAR | Status: DC | PRN
  Administered 2012-05-15 – 2012-05-22 (×31): 1 mg via INTRAVENOUS
  Filled 2012-05-15 (×31): qty 1

## 2012-05-15 MED ORDER — SODIUM CHLORIDE 0.9 % IV SOLN
INTRAVENOUS | Status: AC
  Administered 2012-05-15 – 2012-05-16 (×2): via INTRAVENOUS

## 2012-05-15 MED ORDER — HYDROMORPHONE BOLUS VIA INFUSION: 1.0000 mg | INTRAVENOUS | Status: DC | PRN

## 2012-05-15 MED ORDER — CLINIMIX E/DEXTROSE (5/15) 5 % IV SOLN
INTRAVENOUS | Status: AC
  Administered 2012-05-15: 18:00:00 via INTRAVENOUS
  Filled 2012-05-15: qty 2000

## 2012-05-15 NOTE — Progress Notes (Signed)
Pt. On calorie ct. Started at lunch today.  Pt. Did not eat any lunch or dinner states it makes him feel bad.

## 2012-05-15 NOTE — Progress Notes (Signed)
Subjective:  Increased abdominal pain. Afebrile.  Objective:  Vital Signs in the last 24 hours: Temp:  [97.6 F (36.4 C)-98 F (36.7 C)] 97.6 F (36.4 C) (09/26 1451) Pulse Rate:  [67-82] 82  (09/26 1451) Cardiac Rhythm:  [-]  Resp:  [18-20] 20  (09/26 1451) BP: (113-133)/(54-63) 133/63 mmHg (09/26 1451) SpO2:  [94 %-96 %] 96 % (09/26 1451)  Physical Exam: BP Readings from Last 1 Encounters:  05/15/12 133/63    Wt Readings from Last 1 Encounters:  05/14/12 65.227 kg (143 lb 12.8 oz)    Weight change: -6.573 kg (-14 lb 7.8 oz)  HEENT: [place]/AT, Eyes-Brown, PERL, EOMI, Conjunctiva-Pink, Sclera-icteric Neck: No JVD, No bruit, Trachea midline. Lungs:  Clear, Bilateral. Cardiac:  Regular rhythm, normal S1 and S2, no S3.  Abdomen:  Soft, distended and tender all over, BS +. Extremities:  No edema present. No cyanosis. No clubbing. CNS: AxOx3, Cranial nerves grossly intact, moves all 4 extremities. Right handed. Skin: Warm and dry. + jaundice.   Intake/Output from previous day: 09/25 0701 - 09/26 0700 In: 4455.8 [P.O.:240; I.V.:455.8; TPN:3760] Out: -     Lab Results: BMET    Component Value Date/Time   NA 125* 05/15/2012 0500   K 4.2 05/15/2012 0500   CL 94* 05/15/2012 0500   CO2 20 05/15/2012 0500   GLUCOSE 122* 05/15/2012 0500   BUN 16 05/15/2012 0500   CREATININE 0.89 05/15/2012 0500   CALCIUM 8.6 05/15/2012 0500   GFRNONAA 88* 05/15/2012 0500   GFRAA >90 05/15/2012 0500   CBC    Component Value Date/Time   WBC 11.0* 05/14/2012 0545   RBC 5.37 05/14/2012 0545   HGB 15.6 05/14/2012 0545   HCT 43.4 05/14/2012 0545   PLT 224 05/14/2012 0545   MCV 80.8 05/14/2012 0545   MCH 29.1 05/14/2012 0545   MCHC 35.9 05/14/2012 0545   RDW 16.5* 05/14/2012 0545   LYMPHSABS 1.5 05/12/2012 0500   MONOABS 0.7 05/12/2012 0500   EOSABS 0.5 05/12/2012 0500   BASOSABS 0.0 05/12/2012 0500   CARDIAC ENZYMES Lab Results  Component Value Date   TROPONINI <0.30 05/01/2012     Assessment/Plan:  Patient Active Hospital Problem List: Acute Pancreatitis probably due to GB stone  R/O Ampullary stricture/Neoplasm  (with elevated CA 19-9 and normal CEA)  Diverticulosis  Right inguinal hernia  Duodenal stricture  CBD biliary drain-capped  Obstructive jaundice  Hypoalbuminemia  CAD/Stent  Hyponatremia Hyperkalemia Resolved  Change morphine to dilaudid to improve pain control.   LOS: 14 days    Orpah Cobb  MD  05/15/2012, 7:36 PM

## 2012-05-15 NOTE — Progress Notes (Signed)
Unable to eat more than a few sips. Will need TNA for several weeks.  Very nutritionally depleted.  Will hold off on surgery for a several weeks while nutritional status improved.

## 2012-05-15 NOTE — Progress Notes (Signed)
8 Days Post-Op  Subjective: Still tender RUQ, no apparent nausea.  Still on clears.    Objective: Vital signs in last 24 hours: Temp:  [97.7 F (36.5 C)-98 F (36.7 C)] 97.9 F (36.6 C) (09/26 0615) Pulse Rate:  [67-80] 67  (09/26 0615) Resp:  [18] 18  (09/26 0615) BP: (84-130)/(30-71) 113/54 mmHg (09/26 0615) SpO2:  [94 %-96 %] 94 % (09/26 0615) Weight:  [65.227 kg (143 lb 12.8 oz)] 65.227 kg (143 lb 12.8 oz) (09/25 1237) Last BM Date: 05/12/12  240 PO recorded, Diet: clears, afebrile, VSS, Na 125, LFT's improving  Intake/Output from previous day: 09/25 0701 - 09/26 0700 In: 4455.8 [P.O.:240; I.V.:455.8; TPN:3760] Out: -  Intake/Output this shift:    General appearance: alert, cooperative and no distress GI: soft, tender RUQ, +BS, no distension.  Lab Results:   [facility] 05/14/12 0545  WBC 11.0*  HGB 15.6  HCT 43.4  PLT 224    BMET  Basename 05/15/12 0500 05/14/12 0545  NA 125* 128*  K 4.2 5.2*  CL 94* 93*  CO2 20 21  GLUCOSE 122* 105*  BUN 16 18  CREATININE 0.89 1.04  CALCIUM 8.6 9.3   PT/INR No results found for this basename: LABPROT:2,INR:2 in the last 72 hours   Lab 05/15/12 0500 05/14/12 0545 05/13/12 0520 05/12/12 0500 05/11/12 0825  AST 82* 89* 65* 43* 36  ALT 88* 90* 76* 58* 64*  ALKPHOS 211* 247* 254* 241* 272*  BILITOT 8.3* 11.3* 11.7* 11.5* 11.2*  PROT 6.6 7.5 7.1 6.3 6.3  ALBUMIN 2.2* 2.7* 2.5* 2.4* 2.6*     Lipase     Component Value Date/Time   LIPASE 102* 05/08/2012 0440     Studies/Results: Nm Myocar Multi W/spect W/wall Motion / Ef  05/14/2012  *RADIOLOGY REPORT*  Clinical Data:  Chest pain  MYOCARDIAL IMAGING WITH SPECT (REST AND PHARMACOLOGIC-STRESS) GATED LEFT VENTRICULAR WALL MOTION STUDY LEFT VENTRICULAR EJECTION FRACTION  Technique:  Standard myocardial SPECT imaging was performed after resting intravenous injection of 10 mCi Tc-39m tetrofosmin. Subsequently, intravenous infusion of Lexiscan was performed under the  supervision of the Cardiology staff.  At peak effect of the drug, 30 mCi Tc-47m tetrofosmin was injected intravenously and standard myocardial SPECT  imaging was performed.  Quantitative gated imaging was also performed to evaluate left ventricular wall motion, and estimate left ventricular ejection fraction.  Comparison:  None  Findings: SPECT imaging demonstrates fixed defect in the inferolateral wall compatible with old infarct/scar.  No significant reversibility to suggest ischemia.  Quantitative gated analysis shows hypokinesia of the septum. Akinesia within the area of scar in the inferolateral wall.  The resting left ventricular ejection fraction is 56% with end- diastolic volume of 62 ml and end-systolic volume of 27 ml.  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  No evidence of ischemia.  Ejection fraction 56%.   Original Report Authenticated By: Cyndie Chime, M.D.    Dg Abd 2 Views  05/13/2012  *RADIOLOGY REPORT*  Clinical Data: 66 year old male abdominal pain and distention. Percutaneous biliary drain.  ABDOMEN - 2 VIEW  Comparison: 05/09/2012 and earlier.  Findings: No pneumoperitoneum on the left side down lateral decubitus views.  Percutaneous transhepatic biliary drain re- identified.  Increased small and large bowel gas, but no abnormally dilated loops are identified and gas is present to the level of the rectum. Stable visualized osseous structures.  IMPRESSION: 1. Increased bowel gas which might reflect ileus, but no evidence of mechanical obstruction.  No free  air. 2.  Transhepatic biliary drain in place.   Original Report Authenticated By: Harley Hallmark, M.D.     Medications:    . ALPRAZolam  0.25 mg Oral BID  . insulin aspart  0-9 Units Subcutaneous Q6H  . pantoprazole (PROTONIX) IV  40 mg Intravenous Q1200  . piperacillin-tazobactam (ZOSYN)  IV  3.375 g Intravenous Q8H  . regadenoson  0.4 mg Intravenous Once  . 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  S/p placement of biliary drain thru right posterior biliary duct with tip coiled in distal common bile duct. 05/07/12 IR.  Hypertension  Dyslipidemia  Tobacco use   Plan;  Try full liquids,  We appreciate Dr. Roseanne Kaufman help, I will discuss with DR.Byerly, his question of repeat scan vs exploration with improved LFT's, and try on full liquids.    LOS: 14 days    Aarna Mihalko 05/15/2012

## 2012-05-15 NOTE — Progress Notes (Addendum)
Nutrition Brief Note  Intervention: Calorie count envelope placed on the patient's door and instructions explained to nursing. Expect poor intake as pt refuses to eat or drink including nutritional supplements. TPN per pharmacy. Will monitor.   Diet: Full liquid - 0% intake  TPN: with Clinimix E 5/15 @ 70 ml/hr.  Lipids (20% IVFE @ 10 ml/hr), multivitamins, and trace elements are provided 3 times weekly (MWF) due to national backorder.  Provides 1398 kcal and 84 grams protein daily (based on weekly average).  Meets 82% minimum estimated kcal and 120% minimum estimated protein needs.  Additional IVF with D5 NS @ 50 ml/hr.  - Noted previous notes and plan for gastrojejunostomy if pt with metastatic disease, otherwise whipple procedure. Pt still c/o pain despite getting pain medications 30 minutes ago, notified RN. Pt's intake today has been 0% of meals. Pt denies any nausea but states he has no appetite. Discussed nutritional supplements - pt does not want them in addition to not wanting anything to eat or drink. Noted pt with improving total bilirubin, however AST/ALT trending up. Noted Alk phos trending down. Noted PALB decreasing. Last BM 9/23. Unsure of pt's weight yesterday as one value said 158 pounds and then later on it the day it was 143 pounds, recommend nursing re-weigh pt.   Levon Hedger MS, RD, LDN [phone] Pager [phone] After Hours Pager

## 2012-05-15 NOTE — Progress Notes (Signed)
PARENTERAL NUTRITION CONSULT NOTE - Follow Up  Pharmacy Consult for TNA Indication: pancreatitis, duodenal obstruction  No Known Allergies  Patient Measurements: Height: 5\' 6"  (167.6 cm) (pt. stated) Weight: 143 lb 12.8 oz (65.227 kg) IBW/kg (Calculated) : 63.8   Vital Signs: Temp: 97.9 F (36.6 C) (09/26 0615) Temp src: Oral (09/26 0615) BP: 113/54 mmHg (09/26 0615) Pulse Rate: 67  (09/26 0615) Intake/Output from previous day: 09/25 0701 - 09/26 0700 In: 4455.8 [P.O.:240; I.V.:455.8; TPN:3760] Out: -  Labs:  Basename 05/14/12 0545  WBC 11.0*  HGB 15.6  HCT 43.4  PLT 224  APTT --  INR --    Basename 05/15/12 0500 05/14/12 0545 05/13/12 0520  NA 125* 128* 127*  K 4.2 5.2* 4.1  CL 94* 93* 95*  CO2 20 21 20   GLUCOSE 122* 105* 132*  BUN 16 18 15   CREATININE 0.89 1.04 0.82  LABCREA -- -- --  CREAT24HRUR -- -- --  CALCIUM 8.6 9.3 8.9  MG 2.3 -- --  PHOS 3.7 -- --  PROT 6.6 7.5 7.1  ALBUMIN 2.2* 2.7* 2.5*  AST 82* 89* 65*  ALT 88* 90* 76*  ALKPHOS 211* 247* 254*  BILITOT 8.3* 11.3* 11.7*  BILIDIR -- -- --  IBILI -- -- --  PREALBUMIN -- -- --  TRIG -- -- --  CHOLHDL -- -- --  CHOL -- -- --   Estimated Creatinine Clearance: 74.7 ml/min (by C-G formula based on Cr of 0.89).    Basename 05/15/12 0603 05/14/12 2354 05/14/12 2147  GLUCAP 148* 141* 144*   Insulin Requirements in the past 24 hours:   CBG: 104-148 SSI: 2 units SSI in past 24 hours  Nutritional Goals:  Kcal:1700-1950  Protein:70-85g  Fluid:1.7-1.9L  Current Nutrition:  CL started 9/21 AM- little po intake IVF: D5 NS at 50 ml/min   Clinimix E 5/20 advanced to goal rate of 70 ml/hr + lipids at 10 ml/hr will provide: 84 g/day protein and 1958 Kcal/day MWF, 1478 Kcal/day STTHS (Avg. 1684 Kcal/day weekly).  Assessment:  66 YOM presented 9/12 w/ abdominal tightness and nausea. 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 S/P ERCP, biopsies pending. Had biliary drain placed 9/18  and upsized 9/20. Surgery to decide regarding resection.  Elevated LFT's noted, likely chronic hepatic dysfunction and not directly related to use of TNA. TNA currently with decreased dextrose concentration.  Will Monitor AST/ALT.     Labs  Electrolytes: WNL, except Na+ remains low, cannot adjust in pre-mixed TNA.   K improved to wnl after removal from MIVF  Mg/Phos WNL  LFTs: elevated but stable, T.Bili decreasing   Renal: Scr WNL  TGs/Cholesterol: TG 422 (9/20) -> now reduced to 237 (9/25)  Pre-albumin: 12.6 (9/20)-> 7.5 (9/23)  Plan:   Continue Clinimix E 5/15% at goal rate 6ml/hr.   Continue MIVF at 22ml/hr   Lipids (MWF only due to shortage)  TNA to contain standard multivitamins and trace elements (MWF only due to ongoing shortage).  Continue sensitive SSI q6h  TNA lab panels on Mondays & Thursdays.  Seanne Chirico, Loma Messing PharmD Pager #: [phone] 7:17 AM 05/15/2012

## 2012-05-15 NOTE — Progress Notes (Signed)
8 Days Post-Op  Subjective: Bili drain placed 9/18 Capped 9/24 Pt better No pain; less itching  Objective: Vital signs in last 24 hours: Temp:  [97.7 F (36.5 C)-98 F (36.7 C)] 97.9 F (36.6 C) (09/26 0615) Pulse Rate:  [67-80] 67  (09/26 0615) Resp:  [18] 18  (09/26 0615) BP: (84-130)/(30-71) 113/54 mmHg (09/26 0615) SpO2:  [94 %-96 %] 94 % (09/26 0615) Weight:  [143 lb 12.8 oz (65.227 kg)] 143 lb 12.8 oz (65.227 kg) (09/25 1237) Last BM Date: 05/12/12  Intake/Output from previous day: 09/25 0701 - 09/26 0700 In: 4455.8 [P.O.:240; I.V.:455.8; TPN:3760] Out: -  Intake/Output this shift:    PE:  Bili brain capped and intact Site clean and dry Abd; soft; NT Afeb; VSS TBili: 8.3 AP: 211 LFTs down   Lab Results:   Basename 05/14/12 0545  WBC 11.0*  HGB 15.6  HCT 43.4  PLT 224   BMET  Basename 05/15/12 0500 05/14/12 0545  NA 125* 128*  K 4.2 5.2*  CL 94* 93*  CO2 20 21  GLUCOSE 122* 105*  BUN 16 18  CREATININE 0.89 1.04  CALCIUM 8.6 9.3   PT/INR No results found for this basename: LABPROT:2,INR:2 in the last 72 hours ABG No results found for this basename: PHART:2,PCO2:2,PO2:2,HCO3:2 in the last 72 hours  Studies/Results: Nm Myocar Multi W/spect W/wall Motion / Ef  05/14/2012  *RADIOLOGY REPORT*  Clinical Data:  Chest pain  MYOCARDIAL IMAGING WITH SPECT (REST AND PHARMACOLOGIC-STRESS) GATED LEFT VENTRICULAR WALL MOTION STUDY LEFT VENTRICULAR EJECTION FRACTION  Technique:  Standard myocardial SPECT imaging was performed after resting intravenous injection of 10 mCi Tc-53m tetrofosmin. Subsequently, intravenous infusion of Lexiscan was performed under the supervision of the Cardiology staff.  At peak effect of the drug, 30 mCi Tc-37m tetrofosmin was injected intravenously and standard myocardial SPECT  imaging was performed.  Quantitative gated imaging was also performed to evaluate left ventricular wall motion, and estimate left ventricular ejection fraction.   Comparison:  None  Findings: SPECT imaging demonstrates fixed defect in the inferolateral wall compatible with old infarct/scar.  No significant reversibility to suggest ischemia.  Quantitative gated analysis shows hypokinesia of the septum. Akinesia within the area of scar in the inferolateral wall.  The resting left ventricular ejection fraction is 56% with end- diastolic volume of 62 ml and end-systolic volume of 27 ml.  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  No evidence of ischemia.  Ejection fraction 56%.   Original Report Authenticated By: Cyndie Chime, M.D.    Dg Abd 2 Views  05/13/2012  *RADIOLOGY REPORT*  Clinical Data: 66 year old male abdominal pain and distention. Percutaneous biliary drain.  ABDOMEN - 2 VIEW  Comparison: 05/09/2012 and earlier.  Findings: No pneumoperitoneum on the left side down lateral decubitus views.  Percutaneous transhepatic biliary drain re- identified.  Increased small and large bowel gas, but no abnormally dilated loops are identified and gas is present to the level of the rectum. Stable visualized osseous structures.  IMPRESSION: 1. Increased bowel gas which might reflect ileus, but no evidence of mechanical obstruction.  No free air. 2.  Transhepatic biliary drain in place.   Original Report Authenticated By: Harley Hallmark, M.D.     Anti-infectives: Anti-infectives     Start     Dose/Rate Route Frequency Ordered Stop   05/04/12 1100   piperacillin-tazobactam (ZOSYN) IVPB 3.375 g        3.375 g 12.5 mL/hr over 240 Minutes Intravenous Every 8 hours  05/04/12 1013     05/04/12 1000   piperacillin-tazo (ZOSYN) NICU IV syringe 200 mg/mL  Status:  Discontinued        75 mg/kg  70.4 kg 52.8 mL/hr over 30 Minutes Intravenous Every 8 hours 05/04/12 0947 05/04/12 1011          Assessment/Plan: s/p Procedure(s) (LRB) with comments: ENDOSCOPIC RETROGRADE CHOLANGIOPANCREATOGRAPHY (ERCP) (N/A) - Dr [name] UPPER ENDOSCOPIC ULTRASOUND (EUS)  RADIAL ()  Bili drain capped and intact Tolerating well Plan per GI and Surgery  Bow Buntyn A 05/15/2012

## 2012-05-16 ENCOUNTER — Other Ambulatory Visit (INDEPENDENT_AMBULATORY_CARE_PROVIDER_SITE_OTHER): Payer: Self-pay | Admitting: General Surgery

## 2012-05-16 LAB — GLUCOSE, CAPILLARY
Glucose-Capillary: 116 mg/dL — ABNORMAL HIGH (ref 70–99)
Glucose-Capillary: 116 mg/dL — ABNORMAL HIGH (ref 70–99)

## 2012-05-16 LAB — CBC
MCH: 28.6 pg (ref 26.0–34.0)
MCV: 81.7 fL (ref 78.0–100.0)
Platelets: 171 10*3/uL (ref 150–400)
RDW: 15.6 % — ABNORMAL HIGH (ref 11.5–15.5)
WBC: 6.3 10*3/uL (ref 4.0–10.5)

## 2012-05-16 LAB — COMPREHENSIVE METABOLIC PANEL
ALT: 98 U/L — ABNORMAL HIGH (ref 0–53)
AST: 85 U/L — ABNORMAL HIGH (ref 0–37)
CO2: 22 mEq/L (ref 19–32)
Chloride: 95 mEq/L — ABNORMAL LOW (ref 96–112)
Creatinine, Ser: 0.81 mg/dL (ref 0.50–1.35)
GFR calc non Af Amer: >90 mL/min (ref >90)
Glucose, Bld: 111 mg/dL — ABNORMAL HIGH (ref 70–99)
Total Bilirubin: 7.9 mg/dL — ABNORMAL HIGH (ref 0.3–1.2)

## 2012-05-16 MED ORDER — SODIUM CHLORIDE 0.9 % IV SOLN
INTRAVENOUS | Status: DC
  Administered 2012-05-17: 1000 mL via INTRAVENOUS
  Administered 2012-05-17 – 2012-05-20 (×4): via INTRAVENOUS

## 2012-05-16 MED ORDER — ZINC TRACE METAL 1 MG/ML IV SOLN
INTRAVENOUS | Status: AC
  Administered 2012-05-16: 18:00:00 via INTRAVENOUS
  Filled 2012-05-16: qty 2000

## 2012-05-16 MED ORDER — FAT EMULSION 20 % IV EMUL
250.0000 mL | INTRAVENOUS | Status: AC
  Administered 2012-05-16: 250 mL via INTRAVENOUS
  Filled 2012-05-16: qty 250

## 2012-05-16 MED ORDER — FUROSEMIDE 10 MG/ML IJ SOLN
20.0000 mg | Freq: Once | INTRAMUSCULAR | Status: AC
  Administered 2012-05-16: 20 mg via INTRAVENOUS
  Filled 2012-05-16: qty 2

## 2012-05-16 NOTE — Progress Notes (Signed)
Pt needing TPN at home. Spoke with Renea Ee with [facility]. Made her aware that pt is uninsured and she will work on trying to assist with the home TPN. Spoke with pt about this.

## 2012-05-16 NOTE — Progress Notes (Signed)
9 Days Post-Op  Subjective: Not eating much at all.  i cannot tell what makes his abdominal pain better or worse, no one in room can do a more in depth translation.  He clearly points to mid, left, and right abdomen above the umbilicus as source of pain.  Objective: Vital signs in last 24 hours: Temp:  [97.6 F (36.4 C)-97.7 F (36.5 C)] 97.7 F (36.5 C) (09/26 2200) Pulse Rate:  [82-87] 87  (09/26 2200) Resp:  [20] 20  (09/26 2200) BP: (133-156)/(63-77) 156/77 mmHg (09/26 2200) SpO2:  [90 %-96 %] 90 % (09/26 2200) Last BM Date: 05/12/12  No PO intake recorded, dietician says he's not eating anything and I have canceled calorie count. Afebrile, VSS, Lipase up to 533, other lft's improving Intake/Output from previous day: 09/26 0701 - 09/27 0700 In: 2561.3 [I.V.:481.3; IV Piggyback:400; TPN:1680] Out: -  Intake/Output this shift:    General appearance: alert, cooperative and no distress GI: as above point to pain mid, upper right and left portion of abdome.No distension or pain on palpation, +BS  Lab Results:   [facility] 05/16/12 0915 05/14/12 0545  WBC 6.3 11.0*  HGB 14.1 15.6  HCT 40.3 43.4  PLT 171 224    BMET  Basename 05/16/12 0915 05/15/12 0500  NA 127* 125*  K 4.2 4.2  CL 95* 94*  CO2 22 20  GLUCOSE 111* 122*  BUN 17 16  CREATININE 0.81 0.89  CALCIUM 8.5 8.6   PT/INR No results found for this basename: LABPROT:2,INR:2 in the last 72 hours   Lab 05/16/12 0915 05/15/12 0500 05/14/12 0545 05/13/12 0520 05/12/12 0500  AST 85* 82* 89* 65* 43*  ALT 98* 88* 90* 76* 58*  ALKPHOS 190* 211* 247* 254* 241*  BILITOT 7.9* 8.3* 11.3* 11.7* 11.5*  PROT 6.6 6.6 7.5 7.1 6.3  ALBUMIN 2.3* 2.2* 2.7* 2.5* 2.4*     Lipase     Component Value Date/Time   LIPASE 533* 05/16/2012 0915     Studies/Results: Nm Myocar Multi W/spect W/wall Motion / Ef  05/14/2012  *RADIOLOGY REPORT*  Clinical Data:  Chest pain  MYOCARDIAL IMAGING WITH SPECT (REST AND PHARMACOLOGIC-STRESS)  GATED LEFT VENTRICULAR WALL MOTION STUDY LEFT VENTRICULAR EJECTION FRACTION  Technique:  Standard myocardial SPECT imaging was performed after resting intravenous injection of 10 mCi Tc-41m tetrofosmin. Subsequently, intravenous infusion of Lexiscan was performed under the supervision of the Cardiology staff.  At peak effect of the drug, 30 mCi Tc-67m tetrofosmin was injected intravenously and standard myocardial SPECT  imaging was performed.  Quantitative gated imaging was also performed to evaluate left ventricular wall motion, and estimate left ventricular ejection fraction.  Comparison:  None  Findings: SPECT imaging demonstrates fixed defect in the inferolateral wall compatible with old infarct/scar.  No significant reversibility to suggest ischemia.  Quantitative gated analysis shows hypokinesia of the septum. Akinesia within the area of scar in the inferolateral wall.  The resting left ventricular ejection fraction is 56% with end- diastolic volume of 62 ml and end-systolic volume of 27 ml.  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  No evidence of ischemia.  Ejection fraction 56%.   Original Report Authenticated By: Cyndie Chime, M.D.     Medications:    . ALPRAZolam  0.25 mg Oral BID  . insulin aspart  0-9 Units Subcutaneous Q6H  . pantoprazole (PROTONIX) IV  40 mg Intravenous Q1200  . piperacillin-tazobactam (ZOSYN)  IV  3.375 g Intravenous Q8H  . 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  S/p placement of biliary drain thru right posterior biliary duct with tip coiled in distal common bile duct. 05/07/12 IR.  Hypertension  Dyslipidemia  Tobacco use PCM/TNA Prealbumin is 7.5   Plan:  Case management is  trying to work on TNA, he is apparently self pay. He said his daughter will be back around 5-6PM. I have put in an order for home health TNA.  LOS: 15 days    Maysa Lynn 05/16/2012

## 2012-05-16 NOTE — Progress Notes (Signed)
Subjective:  Improved pain control. Trying full liquid intake. Afebrile  Objective:  Vital Signs in the last 24 hours: Temp:  [97.7 F (36.5 C)-97.9 F (36.6 C)] 97.9 F (36.6 C) (09/27 1351) Pulse Rate:  [68-87] 68  (09/27 1351) Cardiac Rhythm:  [-]  Resp:  [20] 20  (09/27 1351) BP: (135-156)/(70-77) 135/70 mmHg (09/27 1351) SpO2:  [90 %-92 %] 92 % (09/27 1351)  Physical Exam: BP Readings from Last 1 Encounters:  05/16/12 135/70    Wt Readings from Last 1 Encounters:  05/14/12 65.227 kg (143 lb 12.8 oz)    Weight change:   HEENT: [place]/AT, Eyes-Brown, PERL, EOMI, Conjunctiva-Pink, Sclera--icteric Neck: No JVD, No bruit, Trachea midline. Lungs:  Fine crackles, Bilateral. Cardiac:  Regular rhythm, normal S1 and S2, no S3.  Abdomen:  Soft, non-tender. Extremities:  No edema present. No cyanosis. No clubbing. CNS: AxOx3, Cranial nerves grossly intact, moves all 4 extremities. Right handed. Skin: Warm and dry. Jaundice.   Intake/Output from previous day: 09/26 0701 - 09/27 0700 In: 2561.3 [I.V.:481.3; IV Piggyback:400; TPN:1680] Out: -     Lab Results: BMET    Component Value Date/Time   NA 127* 05/16/2012 0915   K 4.2 05/16/2012 0915   CL 95* 05/16/2012 0915   CO2 22 05/16/2012 0915   GLUCOSE 111* 05/16/2012 0915   BUN 17 05/16/2012 0915   CREATININE 0.81 05/16/2012 0915   CALCIUM 8.5 05/16/2012 0915   GFRNONAA >90 05/16/2012 0915   GFRAA >90 05/16/2012 0915   CBC    Component Value Date/Time   WBC 6.3 05/16/2012 0915   RBC 4.93 05/16/2012 0915   HGB 14.1 05/16/2012 0915   HCT 40.3 05/16/2012 0915   PLT 171 05/16/2012 0915   MCV 81.7 05/16/2012 0915   MCH 28.6 05/16/2012 0915   MCHC 35.0 05/16/2012 0915   RDW 15.6* 05/16/2012 0915   LYMPHSABS 1.5 05/12/2012 0500   MONOABS 0.7 05/12/2012 0500   EOSABS 0.5 05/12/2012 0500   BASOSABS 0.0 05/12/2012 0500   CARDIAC ENZYMES Lab Results  Component Value Date   TROPONINI <0.30 05/01/2012    Assessment/Plan:  Patient Active  Hospital Problem List: Acute Pancreatitis probably due to GB stone  R/O Ampullary stricture/Neoplasm  (with elevated CA 19-9 and normal CEA)  Diverticulosis  Right inguinal hernia  Duodenal stricture  CBD biliary drain-capped  Obstructive jaundice  Hypoalbuminemia  CAD/Stent  Hyponatremia  Hyperkalemia  Resolved  Increase activity as tolerated   LOS: 15 days    Orpah Cobb  MD  05/16/2012, 6:38 PM

## 2012-05-16 NOTE — Progress Notes (Signed)
Discussed with daughter and son-in-law and pt.  Explained why waiting to operate is necessary given low prealbumin (7.5) and albumin.  This would yield unacceptably high complication rates.  Have scheduled patient for 10/18 surgery.

## 2012-05-16 NOTE — Progress Notes (Addendum)
PARENTERAL NUTRITION CONSULT NOTE - Follow Up  Pharmacy Consult for TNA Indication: pancreatitis, duodenal obstruction  No Known Allergies  Patient Measurements: Height: 5\' 6"  (167.6 cm) (pt. stated) Weight: 143 lb 12.8 oz (65.227 kg) IBW/kg (Calculated) : 63.8   Vital Signs: Temp: 97.7 F (36.5 C) (09/26 2200) Temp src: Oral (09/26 2200) BP: 156/77 mmHg (09/26 2200) Pulse Rate: 87  (09/26 2200) Intake/Output from previous day: 09/26 0701 - 09/27 0700 In: 2561.3 [I.V.:481.3; IV Piggyback:400; TPN:1680] Out: -  Labs:  [facility] 05/14/12 0545  WBC 11.0*  HGB 15.6  HCT 43.4  PLT 224  APTT --  INR --    Basename 05/15/12 0500 05/14/12 0545  NA 125* 128*  K 4.2 5.2*  CL 94* 93*  CO2 20 21  GLUCOSE 122* 105*  BUN 16 18  CREATININE 0.89 1.04  LABCREA -- --  CREAT24HRUR -- --  CALCIUM 8.6 9.3  MG 2.3 --  PHOS 3.7 --  PROT 6.6 7.5  ALBUMIN 2.2* 2.7*  AST 82* 89*  ALT 88* 90*  ALKPHOS 211* 247*  BILITOT 8.3* 11.3*  BILIDIR -- --  IBILI -- --  PREALBUMIN -- --  TRIG -- --  CHOLHDL -- --  CHOL -- --   Estimated Creatinine Clearance: 74.7 ml/min (by C-G formula based on Cr of 0.89).    Basename 05/16/12 0005 05/15/12 1746 05/15/12 1154  GLUCAP 120* 124* 132*   Insulin Requirements in the past 24 hours:   SSI: 3 units SSI in past 24 hours  Nutritional Goals:  Kcal:1700-1950  Protein:70-85g  Fluid:1.7-1.9L  Current Nutrition:  Full liquids started 9/26- little po intake, on Kcal counts but eating makes him feel sick IVF: NS at 75 ml/min   Clinimix E 5/15 advanced to goal rate of 70 ml/hr + lipids at 10 ml/hr will provide: 84 g/day protein and 1672 Kcal/day MWF, 1193 Kcal/day STTHS (Avg. 1398 Kcal/day weekly).  Assessment:  66 YOM presented 9/12 w/ abdominal tightness and nausea. 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 S/P ERCP, biopsies - no malignancy. Had biliary drain placed 9/18 and upsized 9/20. Surgery to decide regarding  resection, surgery to be postponed until 10/18 per CCS to improve nutritional status  Elevated LFT's noted, likely chronic hepatic dysfunction and not directly related to use of TNA. TNA currently with decreased dextrose concentration.  Will Monitor AST/ALT.     Labs  Electrolytes: Hyponatremia remains, sl improvment, cannot adjust in pre-mixed TNA.   K improved to wnl after removal from MIVF  Mg/Phos WNL  LFTs: elevated but stable, T.Bili unchanged, + jaundice   Renal: Scr WNL (incomplete I/O's)  TGs/Cholesterol: TG 422 (9/20) -> now reduced to 237 (9/25)  Pre-albumin: 12.6 (9/20)-> 7.5 (9/23)  Plan:   Based on goal of boosting nutrition for surgery, prealbumin decline (acknowlege it was not 7 days to eval trend), and daily avg Kcal is 81% of needs, change Clinimix E 5/15% at goal rate 38ml/hr.  New TPN to provide 96gm protein, 1843 Kcal on MWF with lipids, 1363 other days w/o lipids for avg of 1569 kcals/day  Change MIVF at 3ml/hr for inc TPN rate  Lipids (MWF only due to shortage)  TNA to contain standard multivitamins and trace elements (MWF only due to ongoing shortage).  Continue sensitive SSI q6h  Monitor Na+, ? Hypervolemic hyponatremia then suggest reduce NS MIVF rate  TNA lab panels on Mondays & Thursdays.  Dannielle Huh PharmD Pager #: [phone] 7:45 AM 05/16/2012

## 2012-05-16 NOTE — Progress Notes (Signed)
Nutrition Brief Note  - Spoke with PA regarding calorie count and how pt has not been eating/eating minimally - calorie count d/c. Pt did not eat anything yesterday and ate only 1 pudding and 1 ice cream today (245 calories, 6g protein) meeting only 14% calorie needs and 8.5% protein needs. Pt remains on full nutrition support of Clinimix E 5/15 @ 70 ml/hr. Lipids (20% IVFE @ 10 ml/hr). Will monitor.   Levon Hedger MS, RD, LDN [phone] Pager [phone] After Hours Pager

## 2012-05-17 LAB — BASIC METABOLIC PANEL
BUN: 20 mg/dL (ref 6–23)
Calcium: 8.4 mg/dL (ref 8.4–10.5)
Creatinine, Ser: 0.89 mg/dL (ref 0.50–1.35)
GFR calc non Af Amer: 88 mL/min — ABNORMAL LOW (ref >90)
Glucose, Bld: 111 mg/dL — ABNORMAL HIGH (ref 70–99)

## 2012-05-17 LAB — GLUCOSE, CAPILLARY: Glucose-Capillary: 109 mg/dL — ABNORMAL HIGH (ref 70–99)

## 2012-05-17 MED ORDER — CLINIMIX E/DEXTROSE (5/15) 5 % IV SOLN
INTRAVENOUS | Status: AC
  Administered 2012-05-17: 17:00:00 via INTRAVENOUS
  Filled 2012-05-17: qty 2000

## 2012-05-17 NOTE — Progress Notes (Signed)
PARENTERAL NUTRITION CONSULT NOTE - Follow Up  Pharmacy Consult for TNA Indication: pancreatitis, duodenal obstruction  No Known Allergies  Patient Measurements: Height: 5\' 6"  (167.6 cm) (pt. stated) Weight: 143 lb 12.8 oz (65.227 kg) IBW/kg (Calculated) : 63.8   Vital Signs: Temp: 98.5 F (36.9 C) (09/28 0645) Temp src: Oral (09/28 0645) BP: 132/71 mmHg (09/28 0645) Pulse Rate: 68  (09/28 0645) Intake/Output from previous day: 09/27 0701 - 09/28 0700 In: 1776.6 [I.V.:672.2; IV Piggyback:178.3; TPN:926.1] Out: -  Labs:  [facility] 05/16/12 0915  WBC 6.3  HGB 14.1  HCT 40.3  PLT 171  APTT --  INR --    Basename 05/16/12 0915 05/15/12 0500  NA 127* 125*  K 4.2 4.2  CL 95* 94*  CO2 22 20  GLUCOSE 111* 122*  BUN 17 16  CREATININE 0.81 0.89  LABCREA -- --  CREAT24HRUR -- --  CALCIUM 8.5 8.6  MG -- 2.3  PHOS -- 3.7  PROT 6.6 6.6  ALBUMIN 2.3* 2.2*  AST 85* 82*  ALT 98* 88*  ALKPHOS 190* 211*  BILITOT 7.9* 8.3*  BILIDIR -- --  IBILI -- --  PREALBUMIN -- --  TRIG -- --  CHOLHDL -- --  CHOL -- --   Estimated Creatinine Clearance: 82 ml/min (by C-G formula based on Cr of 0.81).    Basename 05/17/12 0532 05/16/12 2343 05/16/12 1819  GLUCAP 117* 125* 115*   Insulin Requirements in the past 24 hours:   SSI: 1 units SSI in past 24 hours  Nutritional Goals:  Kcal:1700-1950  Protein:70-85g  Fluid:1.7-1.9L  Current Nutrition:  Full liquids started 9/26- little po intake, on Kcal counts but eating makes him feel sick IVF: NS at 75 ml/min   Clinimix E 5/15 advanced to goal rate of 80 ml/hr + lipids at 10 ml/hr will provide: 96 g/day protein and 1843 Kcal/day MWF, 1363 Kcal/day STTHS (Avg. 1569 Kcal/day weekly).  Assessment:  66 YOM presented 9/12 w/ abdominal tightness and nausea. 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 S/P ERCP, biopsies - no malignancy. Had biliary drain placed 9/18 and upsized 9/20. Surgery to decide regarding  resection, surgery to be postponed until 10/18 per CCS to improve nutritional status  Elevated LFT's noted, likely chronic hepatic dysfunction and not directly related to use of TNA. TNA currently with decreased dextrose concentration.  Will Monitor AST/ALT.     Labs  Electrolytes: Hyponatremia remains, sl improvment, cannot adjust in pre-mixed TNA.   K improved to wnl after removal from MIVF  Mg/Phos WNL 9/26  LFTs: elevated but stable, T.Bili unchanged, + jaundice   Renal: Scr WNL (incomplete I/O's)  TGs/Cholesterol: TG 422 (9/20) -> now reduced to 237 (9/25)  Pre-albumin: 12.6 (9/20)-> 7.5 (9/23)  Plan:   Continue Clinimix E 5/15% at goal rate 32ml/hr.    Continue MIVF at 70ml/hr for inc TPN rate  Lipids (MWF only due to shortage)  TNA to contain standard multivitamins and trace elements (MWF only due to ongoing shortage).  Continue sensitive SSI q6h  Monitor Na+, ? Hypervolemic hyponatremia then suggest reduce NS MIVF rate  TNA lab panels on Mondays & Thursdays.  Hessie Knows, PharmD, BCPS Pager [phone] 05/17/2012 7:03 AM

## 2012-05-17 NOTE — Progress Notes (Signed)
10 Days Post-Op  Subjective: Not eating much, still having pain  Objective: Vital signs in last 24 hours: Temp:  [97.9 F (36.6 C)-98.5 F (36.9 C)] 98.5 F (36.9 C) (09/28 0645) Pulse Rate:  [68-78] 68  (09/28 0645) Resp:  [18-20] 20  (09/28 0645) BP: (122-135)/(68-71) 132/71 mmHg (09/28 0645) SpO2:  [92 %-100 %] 93 % (09/28 0645) Last BM Date: 05/17/12  Intake/Output from previous day: 09/27 0701 - 09/28 0700 In: 1776.6 [I.V.:672.2; IV Piggyback:178.3; TPN:926.1] Out: -  Intake/Output this shift: Total I/O In: 50 [P.O.:50] Out: -   General appearance: alert, cooperative and no distress GI:  pain mid, upper right and left portion of abdomen.  No distension or pain on palpation, +BS  Lab Results:   [facility] 05/16/12 0915  WBC 6.3  HGB 14.1  HCT 40.3  PLT 171    BMET  Basename 05/17/12 0630 05/16/12 0915  NA 128* 127*  K 3.9 4.2  CL 95* 95*  CO2 23 22  GLUCOSE 111* 111*  BUN 20 17  CREATININE 0.89 0.81  CALCIUM 8.4 8.5   PT/INR No results found for this basename: LABPROT:2,INR:2 in the last 72 hours   Lab 05/16/12 0915 05/15/12 0500 05/14/12 0545 05/13/12 0520 05/12/12 0500  AST 85* 82* 89* 65* 43*  ALT 98* 88* 90* 76* 58*  ALKPHOS 190* 211* 247* 254* 241*  BILITOT 7.9* 8.3* 11.3* 11.7* 11.5*  PROT 6.6 6.6 7.5 7.1 6.3  ALBUMIN 2.3* 2.2* 2.7* 2.5* 2.4*     Lipase     Component Value Date/Time   LIPASE 533* 05/16/2012 0915     Studies/Results: No results found.  Medications:    . ALPRAZolam  0.25 mg Oral BID  . furosemide  20 mg Intravenous Once  . insulin aspart  0-9 Units Subcutaneous Q6H  . pantoprazole (PROTONIX) IV  40 mg Intravenous Q1200  . piperacillin-tazobactam (ZOSYN)  IV  3.375 g Intravenous Q8H  . 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  S/p placement of biliary drain thru right posterior biliary duct with tip coiled in distal common bile duct. 05/07/12 IR.  Hypertension  Dyslipidemia  Tobacco use PCM/TNA Prealbumin is 7.5   Plan:  Case management is trying to work on [employer],  Order placed for home health TNA.  Surgery scheduled for 10/18  LOS: 16 days    Colin Castro C. 05/17/2012

## 2012-05-18 LAB — GLUCOSE, CAPILLARY
Glucose-Capillary: 107 mg/dL — ABNORMAL HIGH (ref 70–99)
Glucose-Capillary: 113 mg/dL — ABNORMAL HIGH (ref 70–99)
Glucose-Capillary: 95 mg/dL (ref 70–99)

## 2012-05-18 LAB — COMPREHENSIVE METABOLIC PANEL
Albumin: 2.4 g/dL — ABNORMAL LOW (ref 3.5–5.2)
Alkaline Phosphatase: 194 U/L — ABNORMAL HIGH (ref 39–117)
BUN: 19 mg/dL (ref 6–23)
Chloride: 98 mEq/L (ref 96–112)
GFR calc Af Amer: >90 mL/min (ref >90)
Glucose, Bld: 109 mg/dL — ABNORMAL HIGH (ref 70–99)
Potassium: 4.4 mEq/L (ref 3.5–5.1)
Total Bilirubin: 5.5 mg/dL — ABNORMAL HIGH (ref 0.3–1.2)

## 2012-05-18 LAB — MAGNESIUM: Magnesium: 2.5 mg/dL (ref 1.5–2.5)

## 2012-05-18 MED ORDER — CLINIMIX E/DEXTROSE (5/15) 5 % IV SOLN
INTRAVENOUS | Status: AC
  Administered 2012-05-18: 17:00:00 via INTRAVENOUS
  Filled 2012-05-18: qty 2000

## 2012-05-18 MED ORDER — FUROSEMIDE 10 MG/ML IJ SOLN
20.0000 mg | Freq: Once | INTRAMUSCULAR | Status: AC
  Administered 2012-05-18: 20 mg via INTRAVENOUS
  Filled 2012-05-18: qty 2

## 2012-05-18 NOTE — Progress Notes (Addendum)
PARENTERAL NUTRITION CONSULT NOTE - Follow Up  Pharmacy Consult for TNA Indication: pancreatitis, duodenal obstruction  No Known Allergies  Patient Measurements: Height: 5\' 6"  (167.6 cm) (pt. stated) Weight: 143 lb 12.8 oz (65.227 kg) IBW/kg (Calculated) : 63.8   Vital Signs: Temp: 98.3 F (36.8 C) (09/28 2247) Temp src: Oral (09/28 2247) BP: 114/71 mmHg (09/28 2247) Pulse Rate: 63  (09/28 2247) Intake/Output from previous day: 09/28 0701 - 09/29 0700 In: 3863.4 [P.O.:50; I.V.:1536.9; IV Piggyback:150; TPN:2126.5] Out: 925 [Urine:925] Labs:  [facility] 05/16/12 0915  WBC 6.3  HGB 14.1  HCT 40.3  PLT 171  APTT --  INR --    Basename 05/18/12 0520 05/17/12 0630 05/16/12 0915  NA 129* 128* 127*  K 4.4 3.9 4.2  CL 98 95* 95*  CO2 22 23 22   GLUCOSE 109* 111* 111*  BUN 19 20 17   CREATININE 0.80 0.89 0.81  LABCREA -- -- --  CREAT24HRUR -- -- --  CALCIUM 8.6 8.4 8.5  MG 2.5 -- --  PHOS 3.9 -- --  PROT 6.7 -- 6.6  ALBUMIN 2.4* -- 2.3*  AST 83* -- 85*  ALT 107* -- 98*  ALKPHOS 194* -- 190*  BILITOT 5.5* -- 7.9*  BILIDIR -- -- --  IBILI -- -- --  PREALBUMIN -- -- --  TRIG -- -- --  CHOLHDL -- -- --  CHOL -- -- --   Estimated Creatinine Clearance: 83.1 ml/min (by C-G formula based on Cr of 0.8).    Basename 05/18/12 0057 05/17/12 2352 05/17/12 2001  GLUCAP 95 126* 123*   Insulin Requirements in the past 24 hours:   SSI: 0 units SSI in past 24 hours  Nutritional Goals:  Kcal:1700-1950  Protein:70-85g  Fluid:1.7-1.9L  Current Nutrition:  Full liquids started 9/26- little po intake, on Kcal counts but eating makes him feel sick IVF: NS at 75 ml/min   Clinimix E 5/15 advanced to goal rate of 80 ml/hr + lipids at 10 ml/hr will provide: 96 g/day protein and 1843 Kcal/day MWF, 1363 Kcal/day STTHS (Avg. 1569 Kcal/day weekly).  Assessment:  66 YOM presented 9/12 w/ abdominal tightness and nausea. 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 S/P  ERCP, biopsies - no malignancy. Had biliary drain placed 9/18 and upsized 9/20. Surgery to decide regarding resection, surgery to be postponed until 10/18 per CCS to improve nutritional status  Elevated LFT's noted, likely chronic hepatic dysfunction and not directly related to use of TNA. TNA currently with decreased dextrose concentration.  Will Monitor AST/ALT.     Labs  Electrolytes: Hyponatremia remains but stable, cannot adjust in pre-mixed TNA.   K improved to wnl after removal from MIVF  Mg/Phos WNL 9/26  LFTs: elevated but stable, T.Bili improved   Renal: Scr WNL  TGs/Cholesterol: TG 422 (9/20) -> now reduced to 237 (9/25)  Pre-albumin: 12.6 (9/20)-> 7.5 (9/23)  Plan:   Continue Clinimix E 5/15% at goal rate 59ml/hr.    MIVF at 18ml/hr - further change in rate per Md  Lipids (MWF only due to shortage)  TNA to contain standard multivitamins and trace elements (MWF only due to ongoing shortage).  D/C SSI  TNA lab panels on Mondays & Thursdays.  ?Should cyclic TNA be considered before discharged or will patient be on continuous TNA at home after discharged  Also, what is plan for Zosyn length of therapy? Today is Day [address] PharmD, BCPS Pager [phone] 05/18/2012 6:32 AM

## 2012-05-18 NOTE — Progress Notes (Signed)
Subjective:  No new complaints. Improving abdominal pain. Afebrile.  Objective:  Vital Signs in the last 24 hours: Temp:  [97.6 F (36.4 C)-98.3 F (36.8 C)] 97.6 F (36.4 C) (09/29 0656) Pulse Rate:  [57-69] 57  (09/29 0656) Cardiac Rhythm:  [-]  Resp:  [18-20] 18  (09/29 0656) BP: (114-149)/(49-82) 149/82 mmHg (09/29 0656) SpO2:  [93 %-96 %] 96 % (09/29 0656) Weight:  [70.8 kg (156 lb 1.4 oz)] 70.8 kg (156 lb 1.4 oz) (09/29 0500)  Physical Exam: BP Readings from Last 1 Encounters:  05/18/12 149/82    Wt Readings from Last 1 Encounters:  05/18/12 70.8 kg (156 lb 1.4 oz)    Weight change:   HEENT: [place]/AT, Eyes-Brown, PERL, EOMI, Conjunctiva-Pink, Sclera-icteric Neck: No JVD, No bruit, Trachea midline. Lungs:  Clear, Bilateral. Cardiac:  Regular rhythm, normal S1 and S2, no S3.  Abdomen:  Soft, non-tender. Extremities:  No edema present. No cyanosis. No clubbing. CNS: AxOx3, Cranial nerves grossly intact, moves all 4 extremities. Right handed. Skin: Warm and dry and yellow.   Intake/Output from previous day: 09/28 0701 - 09/29 0700 In: 3863.4 [P.O.:50; I.V.:1536.9; IV Piggyback:150; TPN:2126.5] Out: 925 [Urine:925]    Lab Results: BMET    Component Value Date/Time   NA 129* 05/18/2012 0520   K 4.4 05/18/2012 0520   CL 98 05/18/2012 0520   CO2 22 05/18/2012 0520   GLUCOSE 109* 05/18/2012 0520   BUN 19 05/18/2012 0520   CREATININE 0.80 05/18/2012 0520   CALCIUM 8.6 05/18/2012 0520   GFRNONAA >90 05/18/2012 0520   GFRAA >90 05/18/2012 0520   CBC    Component Value Date/Time   WBC 6.3 05/16/2012 0915   RBC 4.93 05/16/2012 0915   HGB 14.1 05/16/2012 0915   HCT 40.3 05/16/2012 0915   PLT 171 05/16/2012 0915   MCV 81.7 05/16/2012 0915   MCH 28.6 05/16/2012 0915   MCHC 35.0 05/16/2012 0915   RDW 15.6* 05/16/2012 0915   LYMPHSABS 1.5 05/12/2012 0500   MONOABS 0.7 05/12/2012 0500   EOSABS 0.5 05/12/2012 0500   BASOSABS 0.0 05/12/2012 0500   CARDIAC ENZYMES Lab Results  Component  Value Date   TROPONINI <0.30 05/01/2012    Assessment/Plan:  Patient Active Hospital Problem List: Acute Pancreatitis probably due to GB stone  R/O Ampullary stricture/Neoplasm  (with elevated CA 19-9 and normal CEA)  Diverticulosis  Right inguinal hernia  Duodenal stricture  CBD biliary drain-capped  Obstructive jaundice -improving Hypoalbuminemia  CAD/Stent  Hyponatremia  Hyperkalemia  Resolved  Incentive spirometry.   LOS: 16 days    Orpah Cobb  MD  05/18/2012, 12:43 PM

## 2012-05-18 NOTE — Progress Notes (Signed)
Attempted to flush biliary drain.  Biliary drain would not flush.

## 2012-05-18 NOTE — Progress Notes (Signed)
Bilirubin continues to decline - no longer in need of biliary drain flushes at this time - discontinued as long as bilirubin continues to decline and asymptomatic from elevated bili.  Noted for OR 10/18.

## 2012-05-18 NOTE — Progress Notes (Signed)
Subjective:  Further improvement in abdominal pain.  Objective:  Vital Signs in the last 24 hours: Temp:  [97.6 F (36.4 C)-98.3 F (36.8 C)] 97.6 F (36.4 C) (09/29 0656) Pulse Rate:  [57-69] 57  (09/29 0656) Cardiac Rhythm:  [-]  Resp:  [18-20] 18  (09/29 0656) BP: (114-149)/(49-82) 149/82 mmHg (09/29 0656) SpO2:  [93 %-96 %] 96 % (09/29 0656) Weight:  [70.8 kg (156 lb 1.4 oz)] 70.8 kg (156 lb 1.4 oz) (09/29 0500)  Physical Exam: BP Readings from Last 1 Encounters:  05/18/12 149/82    Wt Readings from Last 1 Encounters:  05/18/12 70.8 kg (156 lb 1.4 oz)    Weight change:   HEENT: [place]/AT, Eyes-Brown, PERL, EOMI, Conjunctiva-Pink, Sclera-icteric Neck: No JVD, No bruit, Trachea midline. Lungs:  Clear, Bilateral. Cardiac:  Regular rhythm, normal S1 and S2, no S3.  Abdomen:  Soft, non-tender. Extremities:  No edema present. No cyanosis. No clubbing. CNS: AxOx3, Cranial nerves grossly intact, moves all 4 extremities. Right handed. Skin: Warm and dry and yellow..   Intake/Output from previous day: 09/28 0701 - 09/29 0700 In: 3863.4 [P.O.:50; I.V.:1536.9; IV Piggyback:150; TPN:2126.5] Out: 925 [Urine:925]    Lab Results: BMET    Component Value Date/Time   NA 129* 05/18/2012 0520   K 4.4 05/18/2012 0520   CL 98 05/18/2012 0520   CO2 22 05/18/2012 0520   GLUCOSE 109* 05/18/2012 0520   BUN 19 05/18/2012 0520   CREATININE 0.80 05/18/2012 0520   CALCIUM 8.6 05/18/2012 0520   GFRNONAA >90 05/18/2012 0520   GFRAA >90 05/18/2012 0520   CBC    Component Value Date/Time   WBC 6.3 05/16/2012 0915   RBC 4.93 05/16/2012 0915   HGB 14.1 05/16/2012 0915   HCT 40.3 05/16/2012 0915   PLT 171 05/16/2012 0915   MCV 81.7 05/16/2012 0915   MCH 28.6 05/16/2012 0915   MCHC 35.0 05/16/2012 0915   RDW 15.6* 05/16/2012 0915   LYMPHSABS 1.5 05/12/2012 0500   MONOABS 0.7 05/12/2012 0500   EOSABS 0.5 05/12/2012 0500   BASOSABS 0.0 05/12/2012 0500   CARDIAC ENZYMES Lab Results  Component Value Date   TROPONINI <0.30 05/01/2012    Assessment/Plan:  Patient Active Hospital Problem List:  Acute Pancreatitis probably due to GB stone  R/O Ampullary stricture/Neoplasm  (with elevated CA 19-9 and normal CEA)  Diverticulosis  Right inguinal hernia  Duodenal stricture  CBD biliary drain-capped  Obstructive jaundice -improving  Hypoalbuminemia  CAD/Stent  Hyponatremia  Improving Hyperkalemia  Resolved  Continue medical treatment.   LOS: 17 days    Orpah Cobb  MD  05/18/2012, 12:45 PM

## 2012-05-19 LAB — COMPREHENSIVE METABOLIC PANEL
Albumin: 2.4 g/dL — ABNORMAL LOW (ref 3.5–5.2)
Alkaline Phosphatase: 206 U/L — ABNORMAL HIGH (ref 39–117)
BUN: 22 mg/dL (ref 6–23)
Chloride: 96 mEq/L (ref 96–112)
Creatinine, Ser: 0.97 mg/dL (ref 0.50–1.35)
GFR calc Af Amer: >90 mL/min (ref >90)
GFR calc non Af Amer: 85 mL/min — ABNORMAL LOW (ref >90)
Glucose, Bld: 101 mg/dL — ABNORMAL HIGH (ref 70–99)
Potassium: 4.1 mEq/L (ref 3.5–5.1)
Total Bilirubin: 5 mg/dL — ABNORMAL HIGH (ref 0.3–1.2)

## 2012-05-19 LAB — CBC
Hemoglobin: 13.7 g/dL (ref 13.0–17.0)
MCH: 27.9 pg (ref 26.0–34.0)
MCHC: 33.8 g/dL (ref 30.0–36.0)
MCV: 82.5 fL (ref 78.0–100.0)
Platelets: 183 10*3/uL (ref 150–400)

## 2012-05-19 LAB — DIFFERENTIAL
Basophils Relative: 1 % (ref 0–1)
Eosinophils Absolute: 0.7 10*3/uL (ref 0.0–0.7)
Eosinophils Relative: 12 % — ABNORMAL HIGH (ref 0–5)
Lymphs Abs: 1.4 10*3/uL (ref 0.7–4.0)
Monocytes Relative: 13 % — ABNORMAL HIGH (ref 3–12)
Neutrophils Relative %: 48 % (ref 43–77)

## 2012-05-19 LAB — GLUCOSE, CAPILLARY: Glucose-Capillary: 113 mg/dL — ABNORMAL HIGH (ref 70–99)

## 2012-05-19 LAB — PHOSPHORUS: Phosphorus: 4.2 mg/dL (ref 2.3–4.6)

## 2012-05-19 LAB — TRIGLYCERIDES: Triglycerides: 235 mg/dL — ABNORMAL HIGH (ref <150)

## 2012-05-19 MED ORDER — SELENIUM 40 MCG/ML IV SOLN
INTRAVENOUS | Status: DC
  Filled 2012-05-19: qty 2000

## 2012-05-19 MED ORDER — INSULIN ASPART 100 UNIT/ML ~~LOC~~ SOLN
0.0000 [IU] | Freq: Four times a day (QID) | SUBCUTANEOUS | Status: DC
  Administered 2012-05-19 – 2012-05-20 (×2): 1 [IU] via SUBCUTANEOUS
  Administered 2012-05-20 – 2012-05-23 (×6): 2 [IU] via SUBCUTANEOUS

## 2012-05-19 MED ORDER — FAT EMULSION 20 % IV EMUL
250.0000 mL | INTRAVENOUS | Status: DC
  Filled 2012-05-19: qty 250

## 2012-05-19 MED ORDER — POLYETHYLENE GLYCOL 3350 17 G PO PACK
17.0000 g | PACK | Freq: Every day | ORAL | Status: DC
  Administered 2012-05-19 – 2012-05-23 (×4): 17 g via ORAL
  Filled 2012-05-19 (×5): qty 1

## 2012-05-19 MED ORDER — BISACODYL 10 MG RE SUPP
10.0000 mg | Freq: Every day | RECTAL | Status: DC | PRN
  Administered 2012-05-19: 10 mg via RECTAL
  Filled 2012-05-19: qty 1

## 2012-05-19 MED ORDER — FAT EMULSION 20 % IV EMUL
250.0000 mL | INTRAVENOUS | Status: AC
  Administered 2012-05-19: 250 mL via INTRAVENOUS
  Filled 2012-05-19: qty 250

## 2012-05-19 MED ORDER — ZINC TRACE METAL 1 MG/ML IV SOLN
INTRAVENOUS | Status: AC
  Administered 2012-05-19: 18:00:00 via INTRAVENOUS
  Filled 2012-05-19: qty 2000

## 2012-05-19 MED ORDER — FUROSEMIDE 10 MG/ML IJ SOLN
20.0000 mg | Freq: Once | INTRAMUSCULAR | Status: AC
  Administered 2012-05-19: 20 mg via INTRAVENOUS
  Filled 2012-05-19: qty 2

## 2012-05-19 NOTE — Progress Notes (Signed)
Subjective:  Little better but constipated. T max 99 F. Decreasing abdominal pain.Na+-131 from 129  Objective:  Vital Signs in the last 24 hours: Temp:  [97.6 F (36.4 C)-99 F (37.2 C)] 97.6 F (36.4 C) (09/30 0617) Pulse Rate:  [63-71] 63  (09/30 0617) Cardiac Rhythm:  [-]  Resp:  [18] 18  (09/30 0617) BP: (102-141)/(62-79) 141/79 mmHg (09/30 0617) SpO2:  [91 %-96 %] 95 % (09/30 0617)  Physical Exam: BP Readings from Last 1 Encounters:  05/19/12 141/79    Wt Readings from Last 1 Encounters:  05/18/12 70.8 kg (156 lb 1.4 oz)    Weight change:   HEENT: [place]/AT, Eyes-Brown, PERL, EOMI, Conjunctiva-Pink, Sclera-icteric Neck: No JVD, No bruit, Trachea midline. Lungs:  Clear, Bilateral. Cardiac:  Regular rhythm, normal S1 and S2, no S3.  Abdomen:  Soft, generalized tenderness. Extremities:  No edema present. No cyanosis. No clubbing. CNS: AxOx3, Cranial nerves grossly intact, moves all 4 extremities. Right handed. Skin: Warm and dry.   Intake/Output from previous day: 09/29 0701 - 09/30 0700 In: 4540 [P.O.:50; I.V.:1823.7; IV Piggyback:292; TPN:2374.3] Out: 1250 [Urine:1250]    Lab Results: BMET    Component Value Date/Time   NA 131* 05/19/2012 0520   K 4.1 05/19/2012 0520   CL 96 05/19/2012 0520   CO2 25 05/19/2012 0520   GLUCOSE 101* 05/19/2012 0520   BUN 22 05/19/2012 0520   CREATININE 0.97 05/19/2012 0520   CALCIUM 8.9 05/19/2012 0520   GFRNONAA 85* 05/19/2012 0520   GFRAA >90 05/19/2012 0520   CBC    Component Value Date/Time   WBC 5.5 05/19/2012 0520   RBC 4.91 05/19/2012 0520   HGB 13.7 05/19/2012 0520   HCT 40.5 05/19/2012 0520   PLT 183 05/19/2012 0520   MCV 82.5 05/19/2012 0520   MCH 27.9 05/19/2012 0520   MCHC 33.8 05/19/2012 0520   RDW 15.0 05/19/2012 0520   LYMPHSABS 1.4 05/19/2012 0520   MONOABS 0.7 05/19/2012 0520   EOSABS 0.7 05/19/2012 0520   BASOSABS 0.1 05/19/2012 0520   CARDIAC ENZYMES Lab Results  Component Value Date   TROPONINI <0.30 05/01/2012     Assessment/Plan:  Patient Active Hospital Problem List: Acute Pancreatitis probably due to GB stone  R/O Ampullary stricture/Neoplasm  (with elevated CA 19-9 and normal CEA)  Diverticulosis  Right inguinal hernia  Duodenal stricture  CBD biliary drain-capped  Obstructive jaundice -improving  Hypoalbuminemia  CAD/Stent  Hyponatremia  Improving  Hyperkalemia   Continue medical treatment. Dulcolax suppository, prn in addition to Miralax   LOS: 18 days    Orpah Cobb  MD  05/19/2012, 1:51 PM

## 2012-05-19 NOTE — Progress Notes (Signed)
12 Days Post-Op  Subjective: Pt still with intermittent abd pain, constipation, itching; no nausea or vomiting ; little po intake  Objective: Vital signs in last 24 hours: Temp:  [97.6 F (36.4 C)-99 F (37.2 C)] 97.6 F (36.4 C) (09/30 0617) Pulse Rate:  [63-71] 63  (09/30 0617) Resp:  [18] 18  (09/30 0617) BP: (102-141)/(62-79) 141/79 mmHg (09/30 0617) SpO2:  [91 %-96 %] 95 % (09/30 0617) Last BM Date: 05/17/12  Intake/Output from previous day: 09/29 0701 - 09/30 0700 In: 4540 [P.O.:50; I.V.:1823.7; IV Piggyback:292; TPN:2374.3] Out: 1250 [Urine:1250] Intake/Output this shift:   I/E biliary drain intact/capped, insertion site ok, mildly tender to palpation, no pericath leaking noted, t bili 5.0  Lab Results:   [facility] 05/19/12 0520  WBC 5.5  HGB 13.7  HCT 40.5  PLT 183   BMET  Basename 05/19/12 0520 05/18/12 0520  NA 131* 129*  K 4.1 4.4  CL 96 98  CO2 25 22  GLUCOSE 101* 109*  BUN 22 19  CREATININE 0.97 0.80  CALCIUM 8.9 8.6   PT/INR No results found for this basename: LABPROT:2,INR:2 in the last 72 hours ABG No results found for this basename: PHART:2,PCO2:2,PO2:2,HCO3:2 in the last 72 hours  Studies/Results: No results found.  Anti-infectives: Anti-infectives     Start     Dose/Rate Route Frequency Ordered Stop   05/04/12 1100  piperacillin-tazobactam (ZOSYN) IVPB 3.375 g       3.375 g 12.5 mL/hr over 240 Minutes Intravenous Every 8 hours 05/04/12 1013     05/04/12 1000   piperacillin-tazo (ZOSYN) NICU IV syringe 200 mg/mL  Status:  Discontinued        75 mg/kg  70.4 kg 52.8 mL/hr over 30 Minutes Intravenous Every 8 hours 05/04/12 0947 05/04/12 1011          Assessment/Plan: s/p I/E biliary drain 9/20 for distal CBD/ ampullary obstruction, capped 9/24; plans as per [facility]CS (surgery tent planned for 10/18)  LOS: 18 days    Colin Castro,D [facility] 05/19/2012

## 2012-05-19 NOTE — Progress Notes (Signed)
Can stop antibiotics.  Tune up for resection

## 2012-05-19 NOTE — Progress Notes (Signed)
Nutrition Brief Note  - Discussed pt with pharmacist today with a focus on improving nutritional status before surgery and pt's decreasing PALB. Re-estimated nutritional needs to be 1750-2100 calories, 85-105g protein. Noted plans to cycle TPN. Will monitor.   Levon Hedger MS, RD, LDN [phone] Pager [phone] After Hours Pager

## 2012-05-19 NOTE — Progress Notes (Signed)
PARENTERAL NUTRITION CONSULT NOTE - Follow Up  Pharmacy Consult for TNA Indication: pancreatitis, duodenal obstruction  No Known Allergies  Patient Measurements: Height: 5\' 6"  (167.6 cm) (pt. stated) Weight: 156 lb 1.4 oz (70.8 kg) IBW/kg (Calculated) : 63.8   Vital Signs: Temp: 97.6 F (36.4 C) (09/30 0617) Temp src: Oral (09/30 0617) BP: 141/79 mmHg (09/30 0617) Pulse Rate: 63  (09/30 0617) Intake/Output from previous day: 09/29 0701 - 09/30 0700 In: 4540 [P.O.:50; I.V.:1823.7; IV Piggyback:292; TPN:2374.3] Out: 1250 [Urine:1250] Labs:  [facility] 05/19/12 0520 05/16/12 0915  WBC 5.5 6.3  HGB 13.7 14.1  HCT 40.5 40.3  PLT 183 171  APTT -- --  INR -- --    Basename 05/19/12 0520 05/18/12 0520 05/17/12 0630 05/16/12 0915  NA 131* 129* 128* --  K 4.1 4.4 3.9 --  CL 96 98 95* --  CO2 25 22 23  --  GLUCOSE 101* 109* 111* --  BUN 22 19 20  --  CREATININE 0.97 0.80 0.89 --  LABCREA -- -- -- --  CREAT24HRUR -- -- -- --  CALCIUM 8.9 8.6 8.4 --  MG 2.3 2.5 -- --  PHOS 4.2 3.9 -- --  PROT 6.8 6.7 -- 6.6  ALBUMIN 2.4* 2.4* -- 2.3*  AST 92* 83* -- 85*  ALT 114* 107* -- 98*  ALKPHOS 206* 194* -- 190*  BILITOT 5.0* 5.5* -- 7.9*  BILIDIR -- -- -- --  IBILI -- -- -- --  PREALBUMIN -- -- -- --  TRIG -- -- -- --  CHOLHDL -- -- -- --  CHOL -- -- -- --   Estimated Creatinine Clearance: 68.5 ml/min (by C-G formula based on Cr of 0.97).    Basename 05/19/12 0527 05/18/12 2308 05/18/12 1610  GLUCAP 107* 113* 107*   Insulin Requirements in the past 24 hours:   SSI: D/C'd 9/29  Nutritional Goals:  Kcal:1700-1950  Protein:70-85g  Fluid:1.7-1.9L  Current Nutrition:  Full liquids started 9/26- little po intake, on Kcal counts but eating makes him feel sick IVF: NS at 50 ml/min   Clinimix E 5/15 advanced to goal rate of 80 ml/hr + lipids at 10 ml/hr will provide: 96 g/day protein and 1843 Kcal/day MWF, 1363 Kcal/day STTHS (Avg. 1569 Kcal/day weekly).  Assessment:  66  YOM presented 9/12 w/ abdominal tightness and nausea. 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 S/P ERCP, biopsies - no malignancy. Had biliary drain placed 9/18 and upsized 9/20. Surgery to decide regarding resection, surgery to be postponed until 10/18 per CCS to improve nutritional status  Elevated LFT's noted, likely chronic hepatic dysfunction and not directly related to use of TNA. TNA currently with decreased dextrose concentration.  Will Monitor AST/ALT.    PO intake inadequate  Labs  Electrolytes: Hyponatremia remains but improving, cannot adjust in pre-mixed TNA.  Corr Ca = 10.2  Mg/Phos WNL 9/26  LFTs: elevated but stable, T.Bili improving  Renal: Scr WNL  TGs/Cholesterol: TG 422 (9/20) -> now reduced to 237 (9/25)  Pre-albumin: 12.6 (9/20)-> 7.5 (9/23)  Plan:   Continue Clinimix E 5/15% at goal rate 44ml/hr.    Lipids (MWF only due to shortage)  TNA to contain standard multivitamins and trace elements (MWF only due to ongoing shortage).  TNA lab panels on Mondays & Thursdays.  Follow-up pending trig, prealbumin from this am  Awaiting possibility of going home on TPN if family can assist patient and financially able. Will start 18h cycle tonight in preparation of possibility of going home.  May be better to cycle anyway as plan is for TPN until OR on 10/18.   Juliette Alcide, PharmD, BCPS.   Pager: [phone] 05/19/2012 7:29 AM

## 2012-05-19 NOTE — Progress Notes (Signed)
Spoke with Milagros Evener with [facility], she plans on meeting with pt's wife and daughter to start the education on TPN at home. She will also assist them in applying for assistance through [email or web address].

## 2012-05-19 NOTE — Progress Notes (Signed)
12 Days Post-Op  Subjective: Still having pain, and not eating.  Also complaining of constipation.  Objective: Vital signs in last 24 hours: Temp:  [97.6 F (36.4 C)-99 F (37.2 C)] 97.6 F (36.4 C) (09/30 0617) Pulse Rate:  [63-71] 63  (09/30 0617) Resp:  [18] 18  (09/30 0617) BP: (102-141)/(62-79) 141/79 mmHg (09/30 0617) SpO2:  [91 %-96 %] 95 % (09/30 0617) Last BM Date: 05/17/12  50 ml PO recorded yesterday, afebrile, VSS, TBil down to 5, it was up to 11.7 (9/23),   Intake/Output from previous day: 09/29 0701 - 09/30 0700 In: 4540 [P.O.:50; I.V.:1823.7; IV Piggyback:292; TPN:2374.3] Out: 1250 [Urine:1250] Intake/Output this shift:    General appearance: alert, cooperative and no distress GI: soft, still had discomfort RUQ areas, to mid abdomen.    Lab Results:   Basename 05/19/12 0520 05/16/12 0915  WBC 5.5 6.3  HGB 13.7 14.1  HCT 40.5 40.3  PLT 183 171    BMET  Basename 05/19/12 0520 05/18/12 0520  NA 131* 129*  K 4.1 4.4  CL 96 98  CO2 25 22  GLUCOSE 101* 109*  BUN 22 19  CREATININE 0.97 0.80  CALCIUM 8.9 8.6   PT/INR No results found for this basename: LABPROT:2,INR:2 in the last 72 hours   Lab 05/19/12 0520 05/18/12 0520 05/16/12 0915 05/15/12 0500 05/14/12 0545  AST 92* 83* 85* 82* 89*  ALT 114* 107* 98* 88* 90*  ALKPHOS 206* 194* 190* 211* 247*  BILITOT 5.0* 5.5* 7.9* 8.3* 11.3*  PROT 6.8 6.7 6.6 6.6 7.5  ALBUMIN 2.4* 2.4* 2.3* 2.2* 2.7*     Lipase     Component Value Date/Time   LIPASE 533* 05/16/2012 0915     Studies/Results: No results found.  Medications:    . ALPRAZolam  0.25 mg Oral BID  . furosemide  20 mg Intravenous Once  . pantoprazole (PROTONIX) IV  40 mg Intravenous Q1200  . piperacillin-tazobactam (ZOSYN)  IV  3.375 g Intravenous Q8H  . sodium chloride  3 mL Intravenous Q12H    Assessment/Plan Duodenal/ampulary mass with CBD obstruction lipase >3000, no hx of pancreatitis even with elevated lipase; elevated LFT's    S/p ENDOSCOPIC RETROGRADE CHOLANGIOPANCREATOGRAPHY (ERCP)  UPPER ENDOSCOPIC ULTRASOUND (EUS) RADIAL, 05/07/12 BX did not show cancer.  S/p placement of biliary drain thru right posterior biliary duct with tip coiled in distal common bile duct. 05/07/12 IR.  Hypertension  Dyslipidemia  Tobacco use  PCM/TNA Prealbumin is 7.5 Constipation Day 15 on Zosyn    Plan:  Continuing to have pain, poor po intake, new complaints of constipation, working on Home TNA, pt has no insurance.  I will discuss need for ongoing antibiotics, with Dr. Luisa Hart.  Try some Miralax, on ativan and dilaudid for pain.  LOS: 18 days    Colin Castro 05/19/2012

## 2012-05-20 LAB — COMPREHENSIVE METABOLIC PANEL
ALT: 121 U/L — ABNORMAL HIGH (ref 0–53)
BUN: 25 mg/dL — ABNORMAL HIGH (ref 6–23)
CO2: 22 mEq/L (ref 19–32)
Calcium: 8.8 mg/dL (ref 8.4–10.5)
Creatinine, Ser: 0.78 mg/dL (ref 0.50–1.35)
GFR calc Af Amer: >90 mL/min (ref >90)
GFR calc non Af Amer: >90 mL/min (ref >90)
Glucose, Bld: 140 mg/dL — ABNORMAL HIGH (ref 70–99)
Total Protein: 7 g/dL (ref 6.0–8.3)

## 2012-05-20 LAB — GLUCOSE, CAPILLARY
Glucose-Capillary: 110 mg/dL — ABNORMAL HIGH (ref 70–99)
Glucose-Capillary: 151 mg/dL — ABNORMAL HIGH (ref 70–99)

## 2012-05-20 LAB — LIPASE, BLOOD: Lipase: 432 U/L — ABNORMAL HIGH (ref 11–59)

## 2012-05-20 MED ORDER — ALPRAZOLAM 0.25 MG PO TABS
0.2500 mg | ORAL_TABLET | Freq: Three times a day (TID) | ORAL | Status: DC
  Administered 2012-05-20 – 2012-05-23 (×9): 0.25 mg via ORAL
  Filled 2012-05-20 (×9): qty 1

## 2012-05-20 MED ORDER — CLINIMIX E/DEXTROSE (5/15) 5 % IV SOLN
INTRAVENOUS | Status: AC
  Administered 2012-05-20: 18:00:00 via INTRAVENOUS
  Filled 2012-05-20: qty 2000

## 2012-05-20 MED ORDER — SODIUM CHLORIDE 0.9 % IV SOLN
INTRAVENOUS | Status: DC
  Administered 2012-05-21 – 2012-05-23 (×2): via INTRAVENOUS

## 2012-05-20 MED ORDER — SODIUM CHLORIDE 0.9 % IV SOLN: INTRAVENOUS | Status: AC

## 2012-05-20 NOTE — Progress Notes (Signed)
Subjective:  Tired of being in hospital. Further improvement in bilirubin. + small BM. Abdominal discomfort continues. Afebrile. Poor appetite. Decreasing itching.  Objective:  Vital Signs in the last 24 hours: Temp:  [98.1 F (36.7 C)-98.6 F (37 C)] 98.1 F (36.7 C) (10/01 1538) Pulse Rate:  [72-79] 73  (10/01 1538) Cardiac Rhythm:  [-]  Resp:  [18-19] 18  (10/01 1538) BP: (112-120)/(61-68) 118/61 mmHg (10/01 1538) SpO2:  [91 %-95 %] 95 % (10/01 1538) Weight:  [68.4 kg (150 lb 12.7 oz)] 68.4 kg (150 lb 12.7 oz) (10/01 0500)  Physical Exam: BP Readings from Last 1 Encounters:  05/20/12 118/61    Wt Readings from Last 1 Encounters:  05/20/12 68.4 kg (150 lb 12.7 oz)    Weight change:   HEENT: [place]/AT, Eyes-Brown, PERL, EOMI, Conjunctiva-Pink, Sclera-Non-icteric Neck: No JVD, No bruit, Trachea midline. Lungs:  Clear, Bilateral. Cardiac:  Regular rhythm, normal S1 and S2, no S3.  Abdomen:  Soft, mild generalized tenderness.Increased bowel sounds. Extremities:  No edema present. No cyanosis. No clubbing. CNS: AxOx3, Cranial nerves grossly intact, moves all 4 extremities. Right handed. Skin: Warm and dry. Less icteric than before.   Intake/Output from previous day: 09/30 0701 - 10/01 0700 In: -  Out: 800 [Urine:800]    Lab Results: BMET    Component Value Date/Time   NA 129* 05/20/2012 0455   K 3.9 05/20/2012 0455   CL 96 05/20/2012 0455   CO2 22 05/20/2012 0455   GLUCOSE 140* 05/20/2012 0455   BUN 25* 05/20/2012 0455   CREATININE 0.78 05/20/2012 0455   CALCIUM 8.8 05/20/2012 0455   GFRNONAA >90 05/20/2012 0455   GFRAA >90 05/20/2012 0455   CBC    Component Value Date/Time   WBC 5.5 05/19/2012 0520   RBC 4.91 05/19/2012 0520   HGB 13.7 05/19/2012 0520   HCT 40.5 05/19/2012 0520   PLT 183 05/19/2012 0520   MCV 82.5 05/19/2012 0520   MCH 27.9 05/19/2012 0520   MCHC 33.8 05/19/2012 0520   RDW 15.0 05/19/2012 0520   LYMPHSABS 1.4 05/19/2012 0520   MONOABS 0.7 05/19/2012 0520   EOSABS 0.7 05/19/2012 0520   BASOSABS 0.1 05/19/2012 0520   CARDIAC ENZYMES Lab Results  Component Value Date   TROPONINI <0.30 05/01/2012    Assessment/Plan:  Patient Active Hospital Problem List: Acute Pancreatitis probably due to GB stone  R/O Ampullary stricture/Neoplasm  (with elevated CA 19-9 and normal CEA)  Diverticulosis  Right inguinal hernia  Duodenal stricture  CBD biliary drain-capped  Obstructive jaundice -improving  Hypoalbuminemia  CAD/Stent  Hyponatremia  Improving  Hyperkalemia   IR-Perc. Cholangiogram through the drain to assist with stent placement v/s removal if normal drainage   LOS: 19 days    Orpah Cobb  MD  05/20/2012, 6:16 PM

## 2012-05-20 NOTE — Progress Notes (Signed)
13 Days Post-Op  Subjective: Bili drain placed 9/20 Capped 9/24 Pt eating little But no real complaints  Objective: Vital signs in last 24 hours: Temp:  [98.3 F (36.8 C)-98.6 F (37 C)] 98.6 F (37 C) (10/01 0600) Pulse Rate:  [72-79] 72  (10/01 0600) Resp:  [18-19] 18  (10/01 0600) BP: (112-120)/(67-68) 120/68 mmHg (10/01 0600) SpO2:  [91 %-93 %] 93 % (10/01 0600) Weight:  [150 lb 12.7 oz (68.4 kg)] 150 lb 12.7 oz (68.4 kg) (10/01 0500) Last BM Date: 05/17/12  Intake/Output from previous day: 09/30 0701 - 10/01 0700 In: -  Out: 800 [Urine:800] Intake/Output this shift:    PE:  Site of capped bili drain intact No signs of infection No bleeding; clean and dry TB: 4.3 AP trending up: 214 Afeb; vss   Lab Results:   [facility] 05/19/12 0520  WBC 5.5  HGB 13.7  HCT 40.5  PLT 183   BMET  Basename 05/20/12 0455 05/19/12 0520  NA 129* 131*  K 3.9 4.1  CL 96 96  CO2 22 25  GLUCOSE 140* 101*  BUN 25* 22  CREATININE 0.78 0.97  CALCIUM 8.8 8.9   PT/INR No results found for this basename: LABPROT:2,INR:2 in the last 72 hours ABG No results found for this basename: PHART:2,PCO2:2,PO2:2,HCO3:2 in the last 72 hours  Studies/Results: No results found.  Anti-infectives: Anti-infectives     Start     Dose/Rate Route Frequency Ordered Stop   05/04/12 1100  piperacillin-tazobactam (ZOSYN) IVPB 3.375 g       3.375 g 12.5 mL/hr over 240 Minutes Intravenous Every 8 hours 05/04/12 1013     05/04/12 1000   piperacillin-tazo (ZOSYN) NICU IV syringe 200 mg/mL  Status:  Discontinued        75 mg/kg  70.4 kg 52.8 mL/hr over 30 Minutes Intravenous Every 8 hours 05/04/12 0947 05/04/12 1011          Assessment/Plan: s/p Procedure(s) (LRB) with comments: ENDOSCOPIC RETROGRADE CHOLANGIOPANCREATOGRAPHY (ERCP) (N/A) - Dr [name] UPPER ENDOSCOPIC ULTRASOUND (EUS) RADIAL ()   Bili drain placed 9/20; capped 9/24 TB 4.3; Alk Phos trending up Duodenal/ampullary mass; OR  tentatively 10/18 Follow as needed   LOS: 19 days   Arta Stump A 05/20/2012

## 2012-05-20 NOTE — Progress Notes (Signed)
13 Days Post-Op  Subjective: Still having pain, no improvement.  Did have some success with miralax, but still constipated.  Objective: Vital signs in last 24 hours: Temp:  [98.3 F (36.8 C)-98.6 F (37 C)] 98.6 F (37 C) (10/01 0600) Pulse Rate:  [72-79] 72  (10/01 0600) Resp:  [18-19] 18  (10/01 0600) BP: (112-120)/(67-68) 120/68 mmHg (10/01 0600) SpO2:  [91 %-93 %] 93 % (10/01 0600) Weight:  [68.4 kg (150 lb 12.7 oz)] 68.4 kg (150 lb 12.7 oz) (10/01 0500) Last BM Date: 05/17/12 50 ml recorded PO yesterday,  On dysphagia diet and TNA. Afebrile,VSS, lipase up to 432.  Intake/Output from previous day: 09/30 0701 - 10/01 0700 In: -  Out: 800 [Urine:800] Intake/Output this shift:    General appearance: alert, cooperative and mild distress GI: soft, ongoing discomfort RUQ.  Lab Results:   [facility] 05/19/12 0520  WBC 5.5  HGB 13.7  HCT 40.5  PLT 183    BMET  Basename 05/20/12 0455 05/19/12 0520  NA 129* 131*  K 3.9 4.1  CL 96 96  CO2 22 25  GLUCOSE 140* 101*  BUN 25* 22  CREATININE 0.78 0.97  CALCIUM 8.8 8.9   PT/INR No results found for this basename: LABPROT:2,INR:2 in the last 72 hours   Lab 05/20/12 0455 05/19/12 0520 05/18/12 0520 05/16/12 0915 05/15/12 0500  AST 83* 92* 83* 85* 82*  ALT 121* 114* 107* 98* 88*  ALKPHOS 214* 206* 194* 190* 211*  BILITOT 4.3* 5.0* 5.5* 7.9* 8.3*  PROT 7.0 6.8 6.7 6.6 6.6  ALBUMIN 2.5* 2.4* 2.4* 2.3* 2.2*     Lipase     Component Value Date/Time   LIPASE 432* 05/20/2012 0455     Studies/Results: No results found.  Medications:    . ALPRAZolam  0.25 mg Oral BID  . furosemide  20 mg Intravenous Once  . insulin aspart  0-9 Units Subcutaneous Q6H  . pantoprazole (PROTONIX) IV  40 mg Intravenous Q1200  . piperacillin-tazobactam (ZOSYN)  IV  3.375 g Intravenous Q8H  . polyethylene glycol  17 g Oral Daily  . sodium chloride  3 mL Intravenous Q12H    Assessment/Plan Duodenal/ampulary mass with CBD obstruction  lipase >3000, no hx of pancreatitis even with elevated lipase; elevated LFT's  S/p ENDOSCOPIC RETROGRADE CHOLANGIOPANCREATOGRAPHY (ERCP)  UPPER ENDOSCOPIC ULTRASOUND (EUS) RADIAL, 05/07/12 BX did not show cancer.  S/p placement of biliary drain thru right posterior biliary duct with tip coiled in distal common bile duct. 05/07/12 IR.  Hypertension  Dyslipidemia  Tobacco use  PCM/TNA Prealbumin was 7.5 now up to 15.6. Constipation  Day 16 on Zosyn OK with surgery to discontinue.  Plan;  Ongoing discomfort from mass, TNA/PCM shows improving albumin.  We have no reason to continue Antibiotics at this time.    LOS: 19 days    Colin Castro 05/20/2012

## 2012-05-20 NOTE — Progress Notes (Signed)
agree

## 2012-05-20 NOTE — Progress Notes (Addendum)
PARENTERAL NUTRITION CONSULT NOTE - Follow Up  Pharmacy Consult for TNA Indication: pancreatitis, duodenal obstruction  No Known Allergies  Patient Measurements: Height: 5\' 6"  (167.6 cm) (pt. stated) Weight: 150 lb 12.7 oz (68.4 kg) IBW/kg (Calculated) : 63.8   Vital Signs: Temp: 98.6 F (37 C) (10/01 0600) Temp src: Oral (10/01 0600) BP: 120/68 mmHg (10/01 0600) Pulse Rate: 72  (10/01 0600) Intake/Output from previous day: 09/30 0701 - 10/01 0700 In: -  Out: 800 [Urine:800] Labs:  [facility] 05/19/12 0520  WBC 5.5  HGB 13.7  HCT 40.5  PLT 183  APTT --  INR --    Basename 05/20/12 0455 05/19/12 0520 05/18/12 0520  NA 129* 131* 129*  K 3.9 4.1 4.4  CL 96 96 98  CO2 22 25 22   GLUCOSE 140* 101* 109*  BUN 25* 22 19  CREATININE 0.78 0.97 0.80  LABCREA -- -- --  CREAT24HRUR -- -- --  CALCIUM 8.8 8.9 8.6  MG -- 2.3 2.5  PHOS -- 4.2 3.9  PROT 7.0 6.8 6.7  ALBUMIN 2.5* 2.4* 2.4*  AST 83* 92* 83*  ALT 121* 114* 107*  ALKPHOS 214* 206* 194*  BILITOT 4.3* 5.0* 5.5*  BILIDIR -- -- --  IBILI -- -- --  PREALBUMIN -- 15.6* --  TRIG -- 235* --  CHOLHDL -- -- --  CHOL -- 152 --   Estimated Creatinine Clearance: 83.1 ml/min (by C-G formula based on Cr of 0.78).    Basename 05/20/12 0617 05/20/12 0027 05/19/12 1815  GLUCAP 128* 151* 121*   Insulin Requirements in the past 24 hours:   SSI: D/C'd 9/29  Nutritional Goals:  Kcal:1750-2100 Protein:85-105g  Fluid:1.7-1.9L  Current Nutrition:  Full liquids started 9/26- little po intake, on Kcal counts but eating makes him feel sick IVF: NS at 50 ml/min   Clinimix E 5/15 advanced to goal cyclic TPN + lipids a ml/hr will provide: 96 g/day protein and 1843 Kcal/day MWF, 1363 Kcal/day STTHS (Avg. 1569 Kcal/day weekly).  Assessment:  66 YOM presented 9/12 w/ abdominal tightness and nausea. 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 S/P ERCP, biopsies - no malignancy. Had biliary drain placed 9/18 and  upsized 9/20. Surgery to decide regarding resection, surgery to be postponed until 10/18 per [facility] to improve nutritional status  Elevated LFT's noted, likely chronic hepatic dysfunction and not directly related to use of TNA. TNA currently with decreased dextrose concentration.  Will Monitor AST/ALT.    PO intake inadequate  Constipation - + BM 10/1 am  Tolerating 1st TPN cycle of 18hrs (thus far)  Labs  Electrolytes: Hyponatremia slightly worse this am, suspect d/t higher cyclic TPN rate at time of lab draws, cannot adjust in pre-mixed TNA.  Corr Ca = 10  Mg/Phos WNL 9/29  LFTs: AST unchanged, ALT and alk Phos inc, T.Bili improving  Renal: Scr WNL  TGs/Cholesterol: TG 422 (9/20) -> now reduced to 237 (9/25)  Pre-albumin: 12.6 (9/20)-> 7.5 (9/23)  --> 15.6 (9/30)  Plan:   Tolerating 18-hr cyclic TPN so far, try Clinimix E 5/15 over 14 hrs. Prealbumin improved  Lipids (MWF only due to shortage)  TNA to contain standard multivitamins and trace elements (MWF only due to ongoing shortage).  Change IVF to run while TPN off  TNA lab panels on Mondays & Thursdays.  Awaiting possibility of going home on TPN if family can care for patient and financially able.  Juliette Alcide, PharmD, BCPS.   Pager: [phone] 05/20/2012 8:17 AM

## 2012-05-21 LAB — GLUCOSE, CAPILLARY
Glucose-Capillary: 157 mg/dL — ABNORMAL HIGH (ref 70–99)
Glucose-Capillary: 92 mg/dL (ref 70–99)
Glucose-Capillary: 106 mg/dL — ABNORMAL HIGH (ref 70–99)
Glucose-Capillary: 157 mg/dL — ABNORMAL HIGH (ref 70–99)

## 2012-05-21 MED ORDER — FAT EMULSION 20 % IV EMUL
240.0000 mL | INTRAVENOUS | Status: AC
  Administered 2012-05-21: 240 mL via INTRAVENOUS
  Filled 2012-05-21: qty 250

## 2012-05-21 MED ORDER — ZINC TRACE METAL 1 MG/ML IV SOLN
INTRAVENOUS | Status: AC
  Administered 2012-05-21: 18:00:00 via INTRAVENOUS
  Filled 2012-05-21: qty 2000

## 2012-05-21 NOTE — Progress Notes (Signed)
Subjective:  Feeling same. Abdominal pain continues.  Objective:  Vital Signs in the last 24 hours: Temp:  [97.6 F (36.4 C)] 97.6 F (36.4 C) (10/02 1452) Pulse Rate:  [65-72] 72  (10/02 1452) Cardiac Rhythm:  [-]  Resp:  [18] 18  (10/02 1452) BP: (133-134)/(56-75) 134/61 mmHg (10/02 1452) SpO2:  [96 %] 96 % (10/02 1452) Weight:  [66.6 kg (146 lb 13.2 oz)] 66.6 kg (146 lb 13.2 oz) (10/02 0520)  Physical Exam: BP Readings from Last 1 Encounters:  05/21/12 134/61    Wt Readings from Last 1 Encounters:  05/21/12 66.6 kg (146 lb 13.2 oz)    Weight change: -1.8 kg (-3 lb 15.5 oz)  HEENT: /AT, Eyes-Brown, PERL, EOMI, Conjunctiva-Pink, Sclera-Non-icteric Neck: No JVD, No bruit, Trachea midline. Lungs:  Clear, Bilateral. Cardiac:  Regular rhythm, normal S1 and S2, no S3.  Abdomen:  Soft, generalized-tender. Extremities:  No edema present. No cyanosis. No clubbing. CNS: AxOx3, Cranial nerves grossly intact, moves all 4 extremities. Right handed. Skin: Warm and dry. Mild yellow hue.   Intake/Output from previous day:      Lab Results: BMET    Component Value Date/Time   NA 129* 05/20/2012 0455   K 3.9 05/20/2012 0455   CL 96 05/20/2012 0455   CO2 22 05/20/2012 0455   GLUCOSE 140* 05/20/2012 0455   BUN 25* 05/20/2012 0455   CREATININE 0.78 05/20/2012 0455   CALCIUM 8.8 05/20/2012 0455   GFRNONAA >90 05/20/2012 0455   GFRAA >90 05/20/2012 0455   CBC    Component Value Date/Time   WBC 5.5 05/19/2012 0520   RBC 4.91 05/19/2012 0520   HGB 13.7 05/19/2012 0520   HCT 40.5 05/19/2012 0520   PLT 183 05/19/2012 0520   MCV 82.5 05/19/2012 0520   MCH 27.9 05/19/2012 0520   MCHC 33.8 05/19/2012 0520   RDW 15.0 05/19/2012 0520   LYMPHSABS 1.4 05/19/2012 0520   MONOABS 0.7 05/19/2012 0520   EOSABS 0.7 05/19/2012 0520   BASOSABS 0.1 05/19/2012 0520   CARDIAC ENZYMES Lab Results  Component Value Date   TROPONINI <0.30 05/01/2012    Assessment/Plan:  Patient Active Hospital Problem  List: Acute Pancreatitis probably due to GB stone  R/O Ampullary stricture/Neoplasm  (with elevated CA 19-9 and normal CEA)  Diverticulosis  Right inguinal hernia  Duodenal stricture  CBD biliary drain-capped  Obstructive jaundice -improving  Hypoalbuminemia  CAD/Stent  Hyponatremia  Improving  Hyperkalemia   Postpone Cholangiogram x 1 week. Increase activity   LOS: 20 days    Orpah Cobb  MD  05/21/2012, 7:19 PM

## 2012-05-21 NOTE — Progress Notes (Signed)
PARENTERAL NUTRITION CONSULT NOTE - Follow Up  Pharmacy Consult for TNA Indication: pancreatitis, duodenal obstruction  No Known Allergies  Patient Measurements: Height: 5\' 6"  (167.6 cm) (pt. stated) Weight: 146 lb 13.2 oz (66.6 kg) IBW/kg (Calculated) : 63.8   Vital Signs: Temp: 97.6 F (36.4 C) (10/02 0520) Temp src: Oral (10/02 0520) BP: 133/75 mmHg (10/02 0520) Pulse Rate: 70  (10/02 0520) Intake/Output from previous day:   Labs:  [facility] 05/19/12 0520  WBC 5.5  HGB 13.7  HCT 40.5  PLT 183  APTT --  INR --    Basename 05/20/12 0455 05/19/12 0520  NA 129* 131*  K 3.9 4.1  CL 96 96  CO2 22 25  GLUCOSE 140* 101*  BUN 25* 22  CREATININE 0.78 0.97  LABCREA -- --  CREAT24HRUR -- --  CALCIUM 8.8 8.9  MG -- 2.3  PHOS -- 4.2  PROT 7.0 6.8  ALBUMIN 2.5* 2.4*  AST 83* 92*  ALT 121* 114*  ALKPHOS 214* 206*  BILITOT 4.3* 5.0*  BILIDIR -- --  IBILI -- --  PREALBUMIN -- 15.6*  TRIG -- 235*  CHOLHDL -- --  CHOL -- 152   Estimated Creatinine Clearance: 83.1 ml/min (by C-G formula based on Cr of 0.78).    Basename 05/20/12 2329 05/20/12 1806 05/20/12 1217  GLUCAP 158* 110* 104*   Insulin Requirements in the past 24 hours:   CBGs: 104-158, required 4 units of sensitive SSI Q6h  Nutritional Goals:  Re-estimated 9/30: Kcal:1750-2100, Protein:85-105g, Fluid:1.7-1.9L Clinimix E 5/15 advanced to goal cyclic TPN + lipids 10 ml/hr on MWF will provide: 96 g/day protein and 1843 Kcal/day MWF, 1363 Kcal/day STTHS (Avg. 1569 Kcal/day weekly).  Current Nutrition:  NPO starting 10/2 IVF: NS (Infuse at 31ml/hr during 08:00 and 18:00 (while TPN is off), saline lock while TPN is infusing)  Assessment:  66 YOM presented 9/12 w/ abdominal tightness and nausea. 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 S/P ERCP, biopsies - no malignancy. Had biliary drain placed 9/18 and upsized 9/20. Surgery to decide regarding resection, surgery to be postponed until  10/18 per [facility] to improve nutritional status  Elevated LFT's noted, likely chronic hepatic dysfunction and not directly related to use of TNA. TNA currently with decreased dextrose concentration.  Will Monitor AST/ALT.    Tolerating 14hrs of TNA per day  Labs (no new labs 10/2)  Electrolytes: Hyponatremia, suspect d/t higher cyclic TPN rate at time of lab draws, cannot adjust in pre-mixed TNA.    Mg/Phos WNL 9/29  LFTs: AST unchanged, ALT and alk Phos inc, T.Bili improving  Renal: Scr WNL  TGs/Cholesterol: TG 422 (9/20) -> now reduced to 237 (9/25)  Pre-albumin: 12.6 (9/20)-> 7.5 (9/23)  --> 15.6 (9/30)  Plan:   Tolerating 14-hr cyclic TPN so far, will continue Clinimix E 5/15 over 14 hrs pending TNA labs tomorrow.  Lipids (MWF only due to shortage)  TNA to contain standard multivitamins and trace elements (MWF only due to ongoing shortage).  IVF to run while TPN off  TNA lab panels on Mondays & Thursdays.  Awaiting possibility of going home on TPN if family can care for patient and financially able.  Geoffry Paradise, PharmD, BCPS Pager: [phone] 8:06 AM Pharmacy #: [phone]

## 2012-05-21 NOTE — Progress Notes (Signed)
Patient ID: Colin Castro, male   DOB: 14-May-1946, 66 y.o.   MRN: [id number]  Dr Algie Coffer and Dr Archer Asa have discussed case  Plan: pt needs through the sheath cholangiogram to evaluate for persistent distal common bile duct obstruction and replace Internal/external biliary drain  Rec: procedure should be performed approx 1 week prior to Scheduled OR with Dr Donell Beers Oct 18.  Can be performed as OP  Please call [phone] if would like to set appt for this procedure

## 2012-05-22 LAB — COMPREHENSIVE METABOLIC PANEL
ALT: 127 U/L — ABNORMAL HIGH (ref 0–53)
AST: 84 U/L — ABNORMAL HIGH (ref 0–37)
Albumin: 2.3 g/dL — ABNORMAL LOW (ref 3.5–5.2)
Alkaline Phosphatase: 203 U/L — ABNORMAL HIGH (ref 39–117)
BUN: 21 mg/dL (ref 6–23)
CO2: 21 mEq/L (ref 19–32)
Calcium: 8.7 mg/dL (ref 8.4–10.5)
Chloride: 97 mEq/L (ref 96–112)
Creatinine, Ser: 0.73 mg/dL (ref 0.50–1.35)
GFR calc Af Amer: >90 mL/min (ref >90)
GFR calc non Af Amer: >90 mL/min (ref >90)
Glucose, Bld: 155 mg/dL — ABNORMAL HIGH (ref 70–99)
Potassium: 4.2 mEq/L (ref 3.5–5.1)
Sodium: 130 mEq/L — ABNORMAL LOW (ref 135–145)
Total Bilirubin: 3.2 mg/dL — ABNORMAL HIGH (ref 0.3–1.2)
Total Protein: 6.5 g/dL (ref 6.0–8.3)

## 2012-05-22 LAB — GLUCOSE, CAPILLARY: Glucose-Capillary: 122 mg/dL — ABNORMAL HIGH (ref 70–99)

## 2012-05-22 LAB — PHOSPHORUS: Phosphorus: 3.6 mg/dL (ref 2.3–4.6)

## 2012-05-22 LAB — MAGNESIUM: Magnesium: 2.3 mg/dL (ref 1.5–2.5)

## 2012-05-22 MED ORDER — CLINIMIX E/DEXTROSE (5/15) 5 % IV SOLN
INTRAVENOUS | Status: AC
  Administered 2012-05-22: 18:00:00 via INTRAVENOUS
  Filled 2012-05-22: qty 2000

## 2012-05-22 MED ORDER — HYDROMORPHONE HCL 2 MG PO TABS
2.0000 mg | ORAL_TABLET | ORAL | Status: DC | PRN
  Administered 2012-05-22 – 2012-05-23 (×3): 2 mg via ORAL
  Filled 2012-05-22 (×3): qty 1

## 2012-05-22 MED ORDER — HYDROMORPHONE HCL 2 MG PO TABS
2.0000 mg | ORAL_TABLET | ORAL | Status: DC | PRN
  Administered 2012-05-22: 2 mg via ORAL
  Filled 2012-05-22: qty 1

## 2012-05-22 NOTE — Plan of Care (Signed)
Spoke with Dr. Algie Coffer regarding continuation of Zosyn this is day 23, pain management IV versus PO, [facility] needed information regarding date for planned discharge.

## 2012-05-22 NOTE — Progress Notes (Signed)
PARENTERAL NUTRITION CONSULT NOTE - Follow Up  Pharmacy Consult for TNA Indication: pancreatitis, duodenal obstruction  No Known Allergies  Patient Measurements: Height: 5\' 6"  (167.6 cm) (pt. stated) Weight: 149 lb 14.6 oz (68 kg) IBW/kg (Calculated) : 63.8   Vital Signs: Temp: 98 F (36.7 C) (10/03 0559) Temp src: Oral (10/03 0559) BP: 136/58 mmHg (10/03 0559) Pulse Rate: 75  (10/03 0559) Intake/Output from previous day:   Labs: No results found for this basename: WBC:3,HGB:3,HCT:3,PLT:3,APTT:3,INR:3 in the last 72 hours  Basename 05/22/12 0500 05/20/12 0455  NA 130* 129*  K 4.2 3.9  CL 97 96  CO2 21 22  GLUCOSE 155* 140*  BUN 21 25*  CREATININE 0.73 0.78  LABCREA -- --  CREAT24HRUR -- --  CALCIUM 8.7 8.8  MG 2.3 --  PHOS 3.6 --  PROT 6.5 7.0  ALBUMIN 2.3* 2.5*  AST 84* 83*  ALT 127* 121*  ALKPHOS 203* 214*  BILITOT 3.2* 4.3*  BILIDIR -- --  IBILI -- --  PREALBUMIN -- --  TRIG -- --  CHOLHDL -- --  CHOL -- --   Estimated Creatinine Clearance: 83.1 ml/min (by C-G formula based on Cr of 0.73).    Basename 05/21/12 2057 05/21/12 1800 05/21/12 1219  GLUCAP 157* 106* 92   Insulin Requirements in the past 24 hours:   CBGs: 104-158, required 4 units of sensitive SSI Q6h  Nutritional Goals:  Re-estimated 9/30: Kcal:1750-2100, Protein:85-105g, Fluid:1.7-1.9L Clinimix E 5/15 advanced to goal cyclic TPN + lipids 10 ml/hr on MWF will provide: 96 g/day protein and 1843 Kcal/day MWF, 1363 Kcal/day STTHS (Avg. 1569 Kcal/day weekly).  Current Nutrition:  Diet: Dysphagia 3 diet starting 10/2 IVF: NS (Infuse at 100ml/hr during 08:00 and 18:00 (while TPN is off), saline lock while TPN is infusing)  Assessment:  66 YOM presented 9/12 w/ abdominal tightness and nausea. 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 S/P ERCP, biopsies - no malignancy. Had biliary drain placed 9/18 and upsized 9/20. Surgery to decide regarding resection, surgery to be  postponed until 10/18 per [facility] to improve nutritional status.  Pt will need cholangiogram 1 week prior to scheduled OR.  Elevated LFT's noted, likely chronic hepatic dysfunction and not directly related to use of TNA. TNA currently with decreased dextrose concentration.  Will Monitor AST/ALT.    Tolerating 14hrs of TNA per day.  Home health is on board for TNA outpatient, awaiting MD orders.  Will cycle TNA over 12 hours.     Labs   Electrolytes: Hyponatremia, suspect d/t higher cyclic TPN rate at time of lab draws, cannot adjust in pre-mixed TNA.  Other lytes wnl   LFTs: AST/ALT and alphos up but has been stable. T.Bili improving  Renal: Scr WNL  TGs/Cholesterol: TG 422 (9/20) -> now reduced to 235 (9/30)  Pre-albumin: 12.6 (9/20)-> 7.5 (9/23)  --> 15.6 (9/30)  Plan:   Tolerating 14-hr cyclic TPN so far, will cycle Clinimix E 5/15 over 12 hrs.  Lipids (MWF only due to shortage)  TNA to contain standard multivitamins and trace elements (MWF only due to ongoing shortage).  IVF to run while TPN off  TNA lab panels on Mondays & Thursdays.  Awaiting possibility of going home on TPN if family can care for patient and financially able.  Geoffry Paradise, PharmD, BCPS Pager: [phone] 9:02 AM Pharmacy #: [phone]

## 2012-05-22 NOTE — Progress Notes (Signed)
BP 140/70  Pulse 69  Temp 97.6 F (36.4 C) (Oral)  Resp 15  Ht 5\' 6"  (1.676 m)  Wt 149 lb 14.6 oz (68 kg)  BMI 24.20 kg/m2  SpO2 92% Pt ok, no new c/o Bili down to 3.2 Drain site clean, no leak. Following along.  Brayton El PA-C 05/22/2012 9:33 AM

## 2012-05-23 LAB — GLUCOSE, CAPILLARY
Glucose-Capillary: 84 mg/dL (ref 70–99)
Glucose-Capillary: 177 mg/dL — ABNORMAL HIGH (ref 70–99)
Glucose-Capillary: 188 mg/dL — ABNORMAL HIGH (ref 70–99)
Glucose-Capillary: 123 mg/dL — ABNORMAL HIGH (ref 70–99)
Glucose-Capillary: 111 mg/dL — ABNORMAL HIGH (ref 70–99)

## 2012-05-23 MED ORDER — HEPARIN SOD (PORK) LOCK FLUSH 100 UNIT/ML IV SOLN
500.0000 [IU] | Freq: Once | INTRAVENOUS | Status: AC
  Administered 2012-05-23: 250 [IU] via INTRAVENOUS
  Filled 2012-05-23: qty 5

## 2012-05-23 MED ORDER — POLYETHYLENE GLYCOL 3350 17 G PO PACK: 17.0000 g | PACK | Freq: Every day | ORAL | Status: DC

## 2012-05-23 MED ORDER — ALPRAZOLAM 0.25 MG PO TABS: 0.2500 mg | ORAL_TABLET | Freq: Three times a day (TID) | ORAL | Status: DC

## 2012-05-23 MED ORDER — OXYCODONE HCL 5 MG PO TABS: 5.0000 mg | ORAL_TABLET | ORAL | Status: DC | PRN

## 2012-05-23 MED ORDER — NICOTINE 21 MG/24HR TD PT24
21.0000 mg | MEDICATED_PATCH | Freq: Every day | TRANSDERMAL | Status: DC
  Administered 2012-05-23: 21 mg via TRANSDERMAL
  Filled 2012-05-23: qty 1

## 2012-05-23 MED ORDER — HYDROXYZINE HCL 10 MG PO TABS: 10.0000 mg | ORAL_TABLET | Freq: Two times a day (BID) | ORAL | Status: DC | PRN

## 2012-05-23 MED ORDER — HEPARIN SOD (PORK) LOCK FLUSH 100 UNIT/ML IV SOLN
500.0000 [IU] | Freq: Once | INTRAVENOUS | Status: AC
  Administered 2012-05-23: 250 [IU] via INTRAVENOUS

## 2012-05-23 MED ORDER — NICOTINE 21 MG/24HR TD PT24: 1.0000 | MEDICATED_PATCH | Freq: Every day | TRANSDERMAL | Status: DC

## 2012-05-23 MED ORDER — INSULIN ASPART 100 UNIT/ML ~~LOC~~ SOLN: 0.0000 [IU] | Freq: Four times a day (QID) | SUBCUTANEOUS | Status: DC

## 2012-05-23 NOTE — Progress Notes (Signed)
TNA Consult  Pharmacy is aware of plans for discharge home today.  Pharmacy will not enter TNA order for tonight.  Upon communication with IV coordinator of [facility] on 10/3, their pharmacy has all the information needed to prepare patient's TNA if discharge today.  Please refer to previous pharmacy progress notes for nutritional goals and recommendations.  Pharmacy will follow up until patient is discharged.  If otherwise, will be available to dose TNA for tonight.  Geoffry Paradise, PharmD, BCPS Pager: [phone] 12:14 PM Pharmacy #: [phone]

## 2012-05-23 NOTE — Progress Notes (Signed)
16 Days Post-Op  Subjective: His pain is better with OxyIR, but still not eating.  I talked with dr. Algie Coffer, and radiology is going to repeat cholangiogram, again before surgery.  Objective: Vital signs in last 24 hours: Temp:  [97.7 F (36.5 C)-99.2 F (37.3 C)] 97.7 F (36.5 C) (10/04 0600) Pulse Rate:  [73-79] 73  (10/04 0600) Resp:  [18] 18  (10/04 0600) BP: (129-156)/(65-74) 156/70 mmHg (10/04 0600) SpO2:  [94 %-98 %] 94 % (10/04 0600) Weight:  [68.6 kg (151 lb 3.8 oz)-70.9 kg (156 lb 4.9 oz)] 70.9 kg (156 lb 4.9 oz) (10/04 1102) Last BM Date: 05/22/12  Intake/Output from previous day: 10/03 0701 - 10/04 0700 In: 13117.8 [I.V.:1895; ZOX:09604.5] Out: 1000 [Urine:1000] Intake/Output this shift: Total I/O In: 3 [I.V.:3] Out: -   General appearance: alert, cooperative and no distress GI: normal exam, pain seems controlled right now.  Lab Results:  No results found for this basename: WBC:2,HGB:2,HCT:2,PLT:2 in the last 72 hours  BMET  Basename 05/22/12 0500  NA 130*  K 4.2  CL 97  CO2 21  GLUCOSE 155*  BUN 21  CREATININE 0.73  CALCIUM 8.7   PT/INR No results found for this basename: LABPROT:2,INR:2 in the last 72 hours   Lab 05/22/12 0500 05/20/12 0455 05/19/12 0520 05/18/12 0520  AST 84* 83* 92* 83*  ALT 127* 121* 114* 107*  ALKPHOS 203* 214* 206* 194*  BILITOT 3.2* 4.3* 5.0* 5.5*  PROT 6.5 7.0 6.8 6.7  ALBUMIN 2.3* 2.5* 2.4* 2.4*     Lipase     Component Value Date/Time   LIPASE 432* 05/20/2012 0455     Studies/Results: No results found.  Medications:    . sodium chloride   Intravenous Q24H  . ALPRAZolam  0.25 mg Oral TID  . insulin aspart  0-9 Units Subcutaneous Q6H  . pantoprazole (PROTONIX) IV  40 mg Intravenous Q1200  . polyethylene glycol  17 g Oral Daily  . sodium chloride  3 mL Intravenous Q12H    Assessment/Plan Duodenal/ampulary mass with CBD obstruction lipase >3000, no hx of pancreatitis even with elevated lipase; elevated  LFT's  S/p ENDOSCOPIC RETROGRADE CHOLANGIOPANCREATOGRAPHY (ERCP)  UPPER ENDOSCOPIC ULTRASOUND (EUS) RADIAL, 05/07/12 BX did not show cancer.  S/p placement of biliary drain thru right posterior biliary duct with tip coiled in distal common bile duct. 05/07/12 IR.  Hypertension  Dyslipidemia  Tobacco use  PCM/TNA Prealbumin improving with TNA   Plan:  Hopefully home today on TNA, I am checking with Dr. Donell Beers on follow up with her before surgery scheduled 10/18.  Follow up by Dr. Donell Beers at 1:45, 06/02/12.  LOS: 22 days    Colin Castro 05/23/2012

## 2012-05-23 NOTE — Progress Notes (Signed)
Patient discharged to home with picc line as per physician order.  Patient to receive TPN at home.  Home care set up for patient to have TPN at home.  Reviewed discharge instructions and prescriptions with patient and daughter at bedside.  Patient to have a follow up appointment with Dr. Donell Beers on Oct 14, not Oct 4.  RN called Dr. Donell Beers office to confirm this date.  All belongings with patient.  Patient escorted to pharmacy via wheelchair by nursing tech to pick up 3 day supply of medications.  Patient discharged.

## 2012-05-23 NOTE — Progress Notes (Signed)
Nutrition Follow-up  Intervention: TPN per pharmacy. Will monitor.   Diet Order: Dysphagia 3, thin, 0-10% intake  TPN with Clinimix E 5/15 @  18:00 to 19:00: rate = 25ml/hr  19:00 to 05:00 rate = 174ml/hr  05:00 to 06:00: rate = 50 ml/hr then turn off TPN + lipids 10 ml/hr on MWF   Above total provides an average of 1569 calories/day and 96g/day of protein.  Meets 90% minimum estimated kcal and 113% minimum estimated protein needs.  - Noted plans to d/c today on cyclic TPN.   - Pt with persistent low sodium - AST/ALT and Alk phos elevated but relatively stable - Total bilirubin trending down - Lipase trending down - PALB improved from 7.5 mg/dL on 4/09 to 81.1 mg/dL on 9/14 - CBGs improved   Meds: Scheduled Meds:   . sodium chloride   Intravenous Q24H  . ALPRAZolam  0.25 mg Oral TID  . insulin aspart  0-9 Units Subcutaneous Q6H  . pantoprazole (PROTONIX) IV  40 mg Intravenous Q1200  . polyethylene glycol  17 g Oral Daily  . sodium chloride  3 mL Intravenous Q12H  . DISCONTD: piperacillin-tazobactam (ZOSYN)  IV  3.375 g Intravenous Q8H   Continuous Infusions:   . TPN (CLINIMIX) +/- additives 50 mL/hr at 05/23/12 0524   PRN Meds:.bisacodyl, diphenhydrAMINE, diphenhydrAMINE-zinc acetate, guaifenesin, HYDROmorphone, hydrOXYzine, ondansetron (ZOFRAN) IV, ondansetron, oxyCODONE, sodium chloride, DISCONTD:  HYDROmorphone (DILAUDID) injection, DISCONTD: HYDROmorphone  Labs:  CMP     Component Value Date/Time   NA 130* 05/22/2012 0500   K 4.2 05/22/2012 0500   CL 97 05/22/2012 0500   CO2 21 05/22/2012 0500   GLUCOSE 155* 05/22/2012 0500   BUN 21 05/22/2012 0500   CREATININE 0.73 05/22/2012 0500   CALCIUM 8.7 05/22/2012 0500   PROT 6.5 05/22/2012 0500   ALBUMIN 2.3* 05/22/2012 0500   AST 84* 05/22/2012 0500   ALT 127* 05/22/2012 0500   ALKPHOS 203* 05/22/2012 0500   BILITOT 3.2* 05/22/2012 0500   GFRNONAA >90 05/22/2012 0500   GFRAA >90 05/22/2012 0500   Lipase  Date Value Range  Status  05/20/2012 432* 11 - 59 U/L Final   Prealbumin  Date Value Range Status  05/19/2012 15.6* 17.0 - 34.0 mg/dL Final   CBG (last 3)   Basename 05/23/12 0757 05/23/12 0613 05/23/12 0205  GLUCAP 111* 123* 188*      Intake/Output Summary (Last 24 hours) at 05/23/12 1054 Last data filed at 05/23/12 1046  Gross per 24 hour  Intake 13120.81 ml  Output    400 ml  Net 12720.81 ml   Last BM - 10/3  Weight Status:   9/29 156 lb 1.4 oz 10/4 156 lb 4.9 oz  Estimated needs:   1750-2100 calories 85-105g protein  Nutrition Dx: Inadequate oral intake r/t duodenal obstruction and pancreatitis as evidenced by clear liquid diet - no longer appropriate as pt on dysphagia 3 diet.   New nutrition dx: Inadequate oral intake r/t poor appetite AEB <25% meal intake.   Goal: TPN to provide >90% of estimated nutritional needs - met as closely as possible with pre-mixed formula.   Monitor: Weights, labs, TPN, BM, intake   Levon Hedger MS, RD, [place] [phone] Pager [phone] After Hours Pager

## 2012-05-23 NOTE — Progress Notes (Signed)
Subjective:  Feeling same. Willing to try oral pain medication.   Objective:  Vital Signs in the last 24 hours: Temp:  [97.7 F (36.5 C)-99.2 F (37.3 C)] 97.7 F (36.5 C) (10/04 0600) Pulse Rate:  [73-79] 73  (10/04 0600) Cardiac Rhythm:  [-]  Resp:  [18] 18  (10/04 0600) BP: (129-156)/(65-74) 156/70 mmHg (10/04 0600) SpO2:  [94 %-98 %] 94 % (10/04 0600) Weight:  [68.6 kg (151 lb 3.8 oz)-70.9 kg (156 lb 4.9 oz)] 70.9 kg (156 lb 4.9 oz) (10/04 1102)  Physical Exam: BP Readings from Last 1 Encounters:  05/23/12 156/70    Wt Readings from Last 1 Encounters:  05/23/12 70.9 kg (156 lb 4.9 oz)    Weight change: 0.6 kg (1 lb 5.2 oz)  HEENT: [place]/AT, Eyes-Brown, PERL, EOMI, Conjunctiva-Pink, Sclera-icteric Neck: No JVD, No bruit, Trachea midline. Lungs:  Clear, Bilateral. Cardiac:  Regular rhythm, normal S1 and S2, no S3.  Abdomen:  Soft, non-tender. Extremities:  No edema present. No cyanosis. No clubbing. CNS: AxOx3, Cranial nerves grossly intact, moves all 4 extremities. Right handed. Skin: Warm and dry. + jaundice   Intake/Output from previous day: 10/03 0701 - 10/04 0700 In: 13117.8 [I.V.:1895; ZOX:09604.5] Out: 1000 [Urine:1000]    Lab Results: BMET    Component Value Date/Time   NA 130* 05/22/2012 0500   K 4.2 05/22/2012 0500   CL 97 05/22/2012 0500   CO2 21 05/22/2012 0500   GLUCOSE 155* 05/22/2012 0500   BUN 21 05/22/2012 0500   CREATININE 0.73 05/22/2012 0500   CALCIUM 8.7 05/22/2012 0500   GFRNONAA >90 05/22/2012 0500   GFRAA >90 05/22/2012 0500   CBC    Component Value Date/Time   WBC 5.5 05/19/2012 0520   RBC 4.91 05/19/2012 0520   HGB 13.7 05/19/2012 0520   HCT 40.5 05/19/2012 0520   PLT 183 05/19/2012 0520   MCV 82.5 05/19/2012 0520   MCH 27.9 05/19/2012 0520   MCHC 33.8 05/19/2012 0520   RDW 15.0 05/19/2012 0520   LYMPHSABS 1.4 05/19/2012 0520   MONOABS 0.7 05/19/2012 0520   EOSABS 0.7 05/19/2012 0520   BASOSABS 0.1 05/19/2012 0520   CARDIAC ENZYMES Lab Results   Component Value Date   TROPONINI <0.30 05/01/2012    Assessment/Plan:  Patient Active Hospital Problem List: Acute Pancreatitis probably due to GB stone  R/O Ampullary stricture/Neoplasm  (with elevated CA 19-9 and normal CEA)  Diverticulosis  Right inguinal hernia  Duodenal stricture  CBD biliary drain-capped  Obstructive jaundice -improving with biliary drain Hypoalbuminemia  CAD/Stent  Hyponatremia  Improving  Hyperkalemia      LOS: 21 days    Orpah Cobb  MD  05/23/2012, 12:40 PM

## 2012-05-23 NOTE — Progress Notes (Signed)
agree

## 2012-05-23 NOTE — Plan of Care (Signed)
Problem: Phase III Progression Outcomes Goal: Discharge plan remains appropriate-arrangements made Outcome: Progressing Home health

## 2012-05-23 NOTE — Discharge Summary (Signed)
Physician Discharge Summary  Patient ID: Colin Castro MRN: [id number] DOB/AGE: 1946/07/23 66 y.o.  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 No fever, alcohol intake, fat intake or prior history of GB or pancreatic disease. GI and IR work-up showed GB stones, ampullary and duodenal strictures. Patient developed obstructive jaundice gradually improving with percutaneous biliary drain placement.   Surgical consult of Dr. Donell Beers was obtained. Patient will undergo surgery for duodenal obstruction/mass and Whipple as needed. He may have repeat cholangiogram through current drain in 1 week.  He was given TPN per pharmacy in hospital and will continue same at home with help from pharmacy and [facility]. Abdominal pain was controlled with oxycodone. He was advised to use soft diet as tolerated, give up smoking with xanax and nicotine patch use and ambulate as tolerated.  On 05/23/2012 he was discharged home in stable condition with last bilirubin of 3.2 on 05/21/2012 from high of 12.2 on 05/08/2012.    Consults: GI and general surgery and IR.  Significant Diagnostic Studies: labs: Near normal CBC, mild hyponatremia, preserved renal function, elevated LFT-mostly bilirubin and Lipase, radiology:  MRI: abdomen with contrast  :Intrahepatic/extrahepatic and pancreatic ductal dilatation, tapering abruptly at the ampulla.  Focal wall thickening involving the second portion of the duodenum and extending to the ampulla, worrisome for  neoplasm.  Cholelithiasis. No choledocholithiasis is seen.   CT scan: Abdomen and Pelvis : 1. Biliary and pancreatic duct dilatation to the level of the  ampulla, raising the question of benign or malignant ampullary  stricture.  2. No contour deforming or abnormal density mass within the  pancreatic head. Further evaluation with ERCP may be helpful.  3. Coronary stent versus coronary calcification.  4. Cholelithiasis.  5. Probable small renal cysts.  6. Diverticulosis.  7. Right inguinal hernia containing a portion of the bladder   CT of chest : 1. No definite findings to suggest metastatic disease to the  chest.  2. There is an enlarged subcarinal lymph node or cluster of small  subcarinal lymph nodes, as above, which may simply be reactive.  Attention on follow-up studies may be warranted.  3. Bibasilar air space consolidation and some degree of bilateral  lower lobe atelectasis (right greater than left), concerning for  multilobar pneumonia and/or sequelae of aspiration.  3. 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  4. Atherosclerosis, including left main and two-vessel coronary  artery disease. Probable coronary artery stents in the proximal  left anterior descending and distal right coronary artery  territories.  5. Post procedural changes in the upper abdomen, as above  Endoscopy: Post bulbar duodenal stricture, biopsy neg for malignancy.  Endoscopic ultrasound.Possible infiltrative or inflammatory process of duodenam and ampulla.   IR-Biliary Drain Catheter placement. 1. Percutaneous transhepatic cholangiogram demonstrates excellent communication between the left and right intrahepatic  biliary systems, as well as patency of the cystic duct.  There is a complete obstruction of the distal common bile duct just proximal to the ampulla of Vater. There is mild - moderate intra and extrahepatic biliary ductal dilatation.  2. Successful crossing of the distal biliary obstruction followed by placement of a 10 French internal/external transhepatic biliary drain. The drain should be left to external drainage until the serum bilirubin has sufficiently decreased. Once it is clear that the drainage is adequate, the tube can be capped and the bile allowed to drain through the tube into the duodenum.  3. If the biopsy results come back as malignant, the cholangiogram findings suggest that the patient would be a candidate for placement of a metallic biliary stent via a transhepatic approach if they are not a surgical candidate. If the obstruction is benign, we could consider serial cholangioplasty.    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 No evidence of ischemia. Ejection fraction 56%.   Treatments: IV hydration, antibiotics: Zosyn, insulin: Humalog and procedures: PICC line, ERCP and percutaneous biliary drainage catheter placement  Discharge Exam: Blood pressure 156/70, pulse 73, temperature 97.7 F (36.5 C), temperature source Oral, resp. rate 18, height 5\' 6"  (1.676 m), weight 70.9 kg (156 lb 4.9 oz), SpO2 94.00%.  HEENT: [place]/AT, Eyes-Brown, PERL, EOMI, Conjunctiva-Pink, Sclera-icteric  Neck: No JVD, No bruit, Trachea midline.  Lungs: Clear, Bilateral.  Cardiac: Regular rhythm, normal S1 and S2, no S3.  Abdomen: Soft, mild epigastric-tenderness.  Extremities: No edema present. No cyanosis. No clubbing.  CNS: AxOx3, Cranial nerves grossly intact, moves all 4 extremities. Right handed.  Skin: Warm and dry. + jaundice  Disposition: Home, self care + [facility] + Pharmacy assistance.     Medication List     As of 05/23/2012 12:43 PM    STOP  taking these medications         atorvastatin 10 MG tablet   Commonly known as: LIPITOR      pravastatin 40 MG tablet   Commonly known as: PRAVACHOL      predniSONE 10 MG tablet   Commonly known as: DELTASONE      TAKE these medications         ALPRAZolam 0.25 MG tablet   Commonly known as: XANAX   Take 1 tablet (0.25 mg total) by mouth 3 (three) times daily.      hydrOXYzine 10 MG tablet   Commonly known as: ATARAX/VISTARIL   Take 1 tablet (10 mg total) by mouth 2 (two) times daily as needed for itching.      insulin aspart 100 UNIT/ML injection   Commonly known as: novoLOG   Inject 0-9 Units into the skin every 6 (six) hours.      lisinopril 20 MG tablet   Commonly known as: PRINIVIL,ZESTRIL   Take 20 mg by mouth daily.      nicotine 21 mg/24hr patch   Commonly known as: NICODERM CQ - dosed in mg/24 hours   Place 1 patch onto the skin daily.      oxyCODONE 5 MG immediate release tablet   Commonly known as: Oxy IR/ROXICODONE   Take 1 tablet (5 mg total) by mouth every 4 (four) hours as needed.      polyethylene glycol packet   Commonly known as: MIRALAX / GLYCOLAX   Take 17 g by mouth daily.       Follow up: Surgery in 1 week, IR in 1 week and Dr. Algie Coffer as needed and 2 months.  SignedOrpah Cobb S 05/23/2012, 12:43  PM

## 2012-05-26 ENCOUNTER — Other Ambulatory Visit (HOSPITAL_COMMUNITY): Payer: Self-pay | Admitting: Cardiovascular Disease

## 2012-05-26 DIAGNOSIS — K831 Obstruction of bile duct: Secondary | ICD-10-CM

## 2012-05-28 ENCOUNTER — Emergency Department (HOSPITAL_COMMUNITY): Payer: Medicaid Other

## 2012-05-28 ENCOUNTER — Emergency Department (HOSPITAL_COMMUNITY)
Admission: EM | Admit: 2012-05-28 | Discharge: 2012-05-28 | Disposition: A | Discharge status: Home / Self Care | Payer: Medicaid Other | Attending: Emergency Medicine | Admitting: Emergency Medicine

## 2012-05-28 ENCOUNTER — Encounter (HOSPITAL_COMMUNITY): Payer: Self-pay

## 2012-05-28 DIAGNOSIS — Z9089 Acquired absence of other organs: Secondary | ICD-10-CM | POA: Insufficient documentation

## 2012-05-28 DIAGNOSIS — R109 Unspecified abdominal pain: Secondary | ICD-10-CM | POA: Insufficient documentation

## 2012-05-28 DIAGNOSIS — R5383 Other fatigue: Secondary | ICD-10-CM | POA: Insufficient documentation

## 2012-05-28 DIAGNOSIS — R5381 Other malaise: Secondary | ICD-10-CM | POA: Insufficient documentation

## 2012-05-28 DIAGNOSIS — I714 Abdominal aortic aneurysm, without rupture, unspecified: Secondary | ICD-10-CM | POA: Insufficient documentation

## 2012-05-28 DIAGNOSIS — K8020 Calculus of gallbladder without cholecystitis without obstruction: Secondary | ICD-10-CM | POA: Insufficient documentation

## 2012-05-28 DIAGNOSIS — Z79899 Other long term (current) drug therapy: Secondary | ICD-10-CM | POA: Insufficient documentation

## 2012-05-28 DIAGNOSIS — K5900 Constipation, unspecified: Secondary | ICD-10-CM | POA: Insufficient documentation

## 2012-05-28 DIAGNOSIS — I10 Essential (primary) hypertension: Secondary | ICD-10-CM | POA: Insufficient documentation

## 2012-05-28 DIAGNOSIS — I2510 Atherosclerotic heart disease of native coronary artery without angina pectoris: Secondary | ICD-10-CM | POA: Insufficient documentation

## 2012-05-28 LAB — BASIC METABOLIC PANEL
Chloride: 99 mEq/L (ref 96–112)
GFR calc Af Amer: >90 mL/min (ref >90)
GFR calc non Af Amer: >90 mL/min (ref >90)
Potassium: 4.6 mEq/L (ref 3.5–5.1)
Sodium: 131 mEq/L — ABNORMAL LOW (ref 135–145)

## 2012-05-28 LAB — URINALYSIS, ROUTINE W REFLEX MICROSCOPIC
Ketones, ur: NEGATIVE mg/dL
Leukocytes, UA: NEGATIVE
Nitrite: NEGATIVE
Protein, ur: NEGATIVE mg/dL
pH: 6 (ref 5.0–8.0)

## 2012-05-28 LAB — CBC WITH DIFFERENTIAL/PLATELET
Basophils Relative: 2 % — ABNORMAL HIGH (ref 0–1)
Eosinophils Absolute: 0.4 10*3/uL (ref 0.0–0.7)
Eosinophils Relative: 7 % — ABNORMAL HIGH (ref 0–5)
HCT: 43.6 % (ref 39.0–52.0)
Hemoglobin: 15 g/dL (ref 13.0–17.0)
MCH: 27.8 pg (ref 26.0–34.0)
MCHC: 34.4 g/dL (ref 30.0–36.0)
MCV: 80.7 fL (ref 78.0–100.0)
Monocytes Absolute: 1 10*3/uL (ref 0.1–1.0)
Monocytes Relative: 16 % — ABNORMAL HIGH (ref 3–12)
Neutro Abs: 3.1 10*3/uL (ref 1.7–7.7)
RDW: 14.2 % (ref 11.5–15.5)

## 2012-05-28 LAB — PROTIME-INR: Prothrombin Time: 13.1 seconds (ref 11.6–15.2)

## 2012-05-28 LAB — LIPASE, BLOOD: Lipase: 582 U/L — ABNORMAL HIGH (ref 11–59)

## 2012-05-28 LAB — COMPREHENSIVE METABOLIC PANEL
Albumin: 2.6 g/dL — ABNORMAL LOW (ref 3.5–5.2)
BUN: 22 mg/dL (ref 6–23)
Calcium: 9.5 mg/dL (ref 8.4–10.5)
Chloride: 97 mEq/L (ref 96–112)
Creatinine, Ser: 0.63 mg/dL (ref 0.50–1.35)
Total Bilirubin: 1.9 mg/dL — ABNORMAL HIGH (ref 0.3–1.2)

## 2012-05-28 MED ORDER — SODIUM CHLORIDE 0.9 % IV BOLUS (SEPSIS)
1000.0000 mL | Freq: Once | INTRAVENOUS | Status: AC
  Administered 2012-05-28: 1000 mL via INTRAVENOUS

## 2012-05-28 MED ORDER — IOHEXOL 300 MG/ML  SOLN
100.0000 mL | Freq: Once | INTRAMUSCULAR | Status: AC | PRN
  Administered 2012-05-28: 100 mL via INTRAVENOUS

## 2012-05-28 MED ORDER — ONDANSETRON HCL 4 MG/2ML IJ SOLN
4.0000 mg | Freq: Once | INTRAMUSCULAR | Status: AC
  Administered 2012-05-28: 4 mg via INTRAVENOUS
  Filled 2012-05-28: qty 2

## 2012-05-28 MED ORDER — HYDROMORPHONE HCL PF 1 MG/ML IJ SOLN
1.0000 mg | Freq: Once | INTRAMUSCULAR | Status: AC
  Administered 2012-05-28: 1 mg via INTRAVENOUS
  Filled 2012-05-28: qty 1

## 2012-05-28 NOTE — ED Notes (Signed)
Catheter length 8cm.  Blood return noted from purple access.  Patient reports that red access is clotted.

## 2012-05-28 NOTE — ED Provider Notes (Signed)
History     CSN: [id number]  Arrival date & time 05/28/12  1126   First MD Initiated Contact with Patient 05/28/12 1200      Chief Complaint  Patient presents with  . Abdominal Pain  . drainage tube placed 09/13     rt side of stomach    (Consider location/radiation/quality/duration/timing/severity/associated sxs/prior treatment) HPI Pt With duodenal mass and scheduled to get Whipple on 10/18. Pt has bilary drain in place. Pt is unable to tolerate PO's. Has PICC and get TPN. Pt present for worsening abd pain x several days, worst last night. No BM x 3 days. No vomiting, fever, chills.  Past Medical History  Diagnosis Date  . Hypertension   . High cholesterol   . Coronary artery disease     Past Surgical History  Procedure Date  . Ercp 05/07/2012    Procedure: ENDOSCOPIC RETROGRADE CHOLANGIOPANCREATOGRAPHY (ERCP);  Surgeon: [name], MD;  Location: [facility];  Service: Endoscopy;  Laterality: N/A;  Dr [name]  . Eus 05/07/2012    Procedure: UPPER ENDOSCOPIC ULTRASOUND (EUS) RADIAL;  Surgeon: [name], MD;  Location: [facility];  Service: Endoscopy;;  . Appendectomy     open, 25 yrs ago  . Coronary stent placement     2 stents placed    No family history on file.  History  Substance Use Topics  . Smoking status: Current Every Day Smoker -- 0.2 packs/day for 48 years    Types: Cigarettes  . Smokeless tobacco: Never Used  . Alcohol Use: No      Review of Systems  Constitutional: Negative for fever and chills.  Respiratory: Negative for shortness of breath.   Cardiovascular: Negative for chest pain.  Gastrointestinal: Positive for abdominal pain and constipation. Negative for nausea, vomiting and diarrhea.  Musculoskeletal: Negative for back pain.  Skin: Negative for rash.  Neurological: Positive for weakness. Negative for dizziness, light-headedness, numbness and headaches.    Allergies  Review of patient's allergies indicates no known  allergies.  Home Medications   Current Outpatient Rx  Name Route Sig Dispense Refill  . ACETAMINOPHEN 500 MG PO TABS Oral Take 500 mg by mouth every 6 (six) hours as needed. pain    . ALPRAZOLAM 0.25 MG PO TABS Oral Take 1 tablet (0.25 mg total) by mouth 3 (three) times daily. 90 tablet 1  . HYDROXYZINE HCL 10 MG PO TABS Oral Take 1 tablet (10 mg total) by mouth 2 (two) times daily as needed for itching. 60 tablet 1  . INSULIN ASPART 100 UNIT/ML Spencer SOLN Subcutaneous Inject 0-9 Units into the skin every 6 (six) hours. 1 vial 1  . LISINOPRIL 20 MG PO TABS Oral Take 20 mg by mouth daily.    . CERTA-VITE PO Oral Take 1 tablet by mouth daily.    [name] NICOTINE 21 MG/24HR TD PT24 Transdermal Place 1 patch onto the skin daily. 28 patch 1  . OXYCODONE HCL 5 MG PO TABS Oral Take 1 tablet (5 mg total) by mouth every 4 (four) hours as needed. 60 tablet 0  . POLYETHYLENE GLYCOL 3350 PO PACK Oral Take 17 g by mouth daily. 30 each 1  . TPN ADULT Intravenous Inject 2,500 mLs into the vein See admin instructions. Pt is on an tpn infusion. 1600 mls. Monday and Friday pt gets only lipids. Tuesday,wednesday,thursday,saturday, Sunday he gets the tpn infusion without lipids. [facility] provides the tpn for the patient.      BP 95/70  Pulse 74  Temp 97.4 F (36.3 C) (Oral)  Resp 18  SpO2 95%  Physical Exam  Nursing note and vitals reviewed. Constitutional: He is oriented to person, place, and time. He appears well-developed and well-nourished.       Wasted and dry appearing  HENT:  Head: Normocephalic and atraumatic.  Mouth/Throat: Oropharynx is clear and moist.  Eyes: EOM are normal. Pupils are equal, round, and reactive to light. Scleral icterus is present.  Neck: Normal range of motion. Neck supple.  Cardiovascular: Normal rate and regular rhythm.   Pulmonary/Chest: Effort normal and breath sounds normal. No respiratory distress. He has no wheezes. He has no rales.  Abdominal: Soft. Bowel  sounds are normal. There is tenderness (TTP across upper abd. No rebound or guarding).  Musculoskeletal: Normal range of motion. He exhibits no edema and no tenderness.  Neurological: He is alert and oriented to person, place, and time.       Moves all ext without deficit  Skin: Skin is warm and dry. No rash noted. No erythema.  Psychiatric: He has a normal mood and affect. His behavior is normal.    ED Course  Procedures (including critical care time)  Labs Reviewed  CBC WITH DIFFERENTIAL - Abnormal; Notable for the following:    Monocytes Relative 16 (*)     Eosinophils Relative 7 (*)     Basophils Relative 2 (*)     All other components within normal limits  COMPREHENSIVE METABOLIC PANEL - Abnormal; Notable for the following:    Sodium 129 (*)     Potassium 5.6 (*)     Glucose, Bld 102 (*)     Albumin 2.6 (*)     AST 79 (*)     ALT 122 (*)     Alkaline Phosphatase 224 (*)     Total Bilirubin 1.9 (*)     All other components within normal limits  LIPASE, BLOOD - Abnormal; Notable for the following:    Lipase 582 (*)     All other components within normal limits  URINALYSIS, ROUTINE W REFLEX MICROSCOPIC - Abnormal; Notable for the following:    Glucose, UA 250 (*)     All other components within normal limits  APTT - Abnormal; Notable for the following:    aPTT 39 (*)     All other components within normal limits  BASIC METABOLIC PANEL - Abnormal; Notable for the following:    Sodium 131 (*)     All other components within normal limits  PROTIME-INR  LAB REPORT - SCANNED   Ct Abdomen Pelvis W Contrast  05/28/2012  *RADIOLOGY REPORT*  Clinical Data: Severe abdominal pain.  CT ABDOMEN AND PELVIS WITH CONTRAST  Technique:  Multidetector CT imaging of the abdomen and pelvis was performed following the standard protocol during bolus administration of intravenous contrast.  Contrast: OMNIPAQUE IOHEXOL 300 MG/ML  SOLN  Comparison: CT abdomen pelvis 05/01/2012.  Findings:  Lung bases show atelectasis in both lower lobes.  Heart size within normal limits.  No pericardial or pleural effusion.  A percutaneous drain courses through the common bile duct, terminating in the duodenum.  There is resultant pneumobilia.  A stone is seen in the fundus of the gallbladder.  Adrenal glands are unremarkable.  Sub centimeter low attenuation lesions in the kidneys are too small to characterize.  Spleen is unremarkable. Pancreatic duct is dilated, measuring 4 mm.  No pancreatic atrophy. Stomach and bowel are unremarkable.  Small bilateral inguinal hernias  contain fat.  Right inguinal hernia contains a small portion of the bladder.  Inguinal lymph nodes are borderline enlarged, measuring up to 10 mm on the left. Atherosclerotic calcification of the arterial vasculature. Infrarenal aorta measures up to 3 cm.  No free fluid.  No worrisome lytic or sclerotic lesions.  IMPRESSION:  1.  Percutaneous biliary drain in place, terminating in the duodenum, with pneumobilia. Together with pancreatic ductal dilatation, an ampullary lesion is questioned. 2.  Cholelithiasis. 3.  Infrarenal aortic aneurysm. 4.  Bilateral inguinal hernias.   Original Report Authenticated By: Reyes Ivan, M.D.      1. Abdominal pain       MDM  Discussed with surgery. Known pt. CT pending        Loren Racer, MD 05/29/12 [phone]

## 2012-05-28 NOTE — ED Notes (Signed)
[id number]<AV> Expected date:<BR> Expected time:<BR> Means of arrival:<BR> Comments:<BR> Hold for triage 1

## 2012-05-28 NOTE — Progress Notes (Signed)
Patient ID: Colin Castro, male   DOB: 1945/09/06, 66 y.o.   MRN: [id number]    Subjective: Pt came to [facility] due to worsening abdominal pain.  He denies nausea/vomiting.  He's been taking 1 oxycodone which doesn't completely take his pain away.  He had a CT scan while here which is unchanged.  His TB is actually down to 1.9.    Objective: Vital signs in last 24 hours: Temp:  [97.6 F (36.4 C)] 97.6 F (36.4 C) (10/09 1132) Pulse Rate:  [81] 81  (10/09 1132) Resp:  [18] 18  (10/09 1132) BP: (145)/(65) 145/65 mmHg (10/09 1132) SpO2:  [96 %] 96 % (10/09 1132)    Intake/Output from previous day:   Intake/Output this shift:    PE: Abd: soft, minimal RUQ tenderness after pain medicine. ND, biliary drain with bilious output.  Lab Results:   Basename 05/28/12 1341  WBC 6.6  HGB 15.0  HCT 43.6  PLT 273   BMET  Basename 05/28/12 1341  NA 129*  K 5.6*  CL 97  CO2 21  GLUCOSE 102*  BUN 22  CREATININE 0.63  CALCIUM 9.5   PT/INR  Basename 05/28/12 1341  LABPROT 13.1  INR 1.00   CMP     Component Value Date/Time   NA 129* 05/28/2012 1341   K 5.6* 05/28/2012 1341   CL 97 05/28/2012 1341   CO2 21 05/28/2012 1341   GLUCOSE 102* 05/28/2012 1341   BUN 22 05/28/2012 1341   CREATININE 0.63 05/28/2012 1341   CALCIUM 9.5 05/28/2012 1341   PROT 7.4 05/28/2012 1341   ALBUMIN 2.6* 05/28/2012 1341   AST 79* 05/28/2012 1341   ALT 122* 05/28/2012 1341   ALKPHOS 224* 05/28/2012 1341   BILITOT 1.9* 05/28/2012 1341   GFRNONAA >90 05/28/2012 1341   GFRAA >90 05/28/2012 1341   Lipase     Component Value Date/Time   LIPASE 582* 05/28/2012 1341       Studies/Results: Ct Abdomen Pelvis W Contrast  05/28/2012  *RADIOLOGY REPORT*  Clinical Data: Severe abdominal pain.  CT ABDOMEN AND PELVIS WITH CONTRAST  Technique:  Multidetector CT imaging of the abdomen and pelvis was performed following the standard protocol during bolus administration of intravenous contrast.  Contrast: OMNIPAQUE  IOHEXOL 300 MG/ML  SOLN  Comparison: CT abdomen pelvis 05/01/2012.  Findings: Lung bases show atelectasis in both lower lobes.  Heart size within normal limits.  No pericardial or pleural effusion.  A percutaneous drain courses through the common bile duct, terminating in the duodenum.  There is resultant pneumobilia.  A stone is seen in the fundus of the gallbladder.  Adrenal glands are unremarkable.  Sub centimeter low attenuation lesions in the kidneys are too small to characterize.  Spleen is unremarkable. Pancreatic duct is dilated, measuring 4 mm.  No pancreatic atrophy. Stomach and bowel are unremarkable.  Small bilateral inguinal hernias contain fat.  Right inguinal hernia contains a small portion of the bladder.  Inguinal lymph nodes are borderline enlarged, measuring up to 10 mm on the left. Atherosclerotic calcification of the arterial vasculature. Infrarenal aorta measures up to 3 cm.  No free fluid.  No worrisome lytic or sclerotic lesions.  IMPRESSION:  1.  Percutaneous biliary drain in place, terminating in the duodenum, with pneumobilia. Together with pancreatic ductal dilatation, an ampullary lesion is questioned. 2.  Cholelithiasis. 3.  Infrarenal aortic aneurysm. 4.  Bilateral inguinal hernias.   Original Report Authenticated By: Reyes Ivan, M.D.  Anti-infectives: Anti-infectives    None       Assessment/Plan  1. Duodenal obstruction, ? Pancreatic vs duodenal mass  Plan: 1. I have informed the patient and his daughter to take 1-2 oxycodone every 4 hours and see how that helps.  Unfortunately, due to his problem, he is going to have pain.  He is scheduled for a whipple procedure on 06-06-12.  No acute need for admission as patient seems quite comfortable and a plan has been implemented to help control the patient's pain.  He is to see Dr. Donell Beers in the office on Monday.      LOS: 0 days    OSBORNE,KELLY E 05/28/2012, 4:31 PM Pager: [phone]  Agree with above.  Has  follow up with Dr. Donell Beers.  Ovidio Kin, MD, [facility] Pager: [phone] Office phone:  [phone]

## 2012-05-28 NOTE — ED Provider Notes (Signed)
Patient turned over to me from the day emergency physician patient had been evaluated by general surgery and also had a CT scan was eligible to go home and has a Whipple scheduled and this will solve most of his immediate concerns. Potassium was elevated probably with small cysts on repeat it was normal. Patient has followup with Gen. Surgery.  Results for orders placed during the hospital encounter of 05/28/12  CBC WITH DIFFERENTIAL      Component Value Range   WBC 6.6  4.0 - 10.5 K/uL   RBC 5.40  4.22 - 5.81 MIL/uL   Hemoglobin 15.0  13.0 - 17.0 g/dL   HCT 16.1  09.6 - 04.5 %   MCV 80.7  78.0 - 100.0 fL   MCH 27.8  26.0 - 34.0 pg   MCHC 34.4  30.0 - 36.0 g/dL   RDW 40.9  81.1 - 91.4 %   Platelets 273  150 - 400 K/uL   Neutrophils Relative 47  43 - 77 %   Neutro Abs 3.1  1.7 - 7.7 K/uL   Lymphocytes Relative 30  12 - 46 %   Lymphs Abs 2.0  0.7 - 4.0 K/uL   Monocytes Relative 16 (*) 3 - 12 %   Monocytes Absolute 1.0  0.1 - 1.0 K/uL   Eosinophils Relative 7 (*) 0 - 5 %   Eosinophils Absolute 0.4  0.0 - 0.7 K/uL   Basophils Relative 2 (*) 0 - 1 %   Basophils Absolute 0.1  0.0 - 0.1 K/uL  COMPREHENSIVE METABOLIC PANEL      Component Value Range   Sodium 129 (*) 135 - 145 mEq/L   Potassium 5.6 (*) 3.5 - 5.1 mEq/L   Chloride 97  96 - 112 mEq/L   CO2 21  19 - 32 mEq/L   Glucose, Bld 102 (*) 70 - 99 mg/dL   BUN 22  6 - 23 mg/dL   Creatinine, Ser 7.82  0.50 - 1.35 mg/dL   Calcium 9.5  8.4 - 95.6 mg/dL   Total Protein 7.4  6.0 - 8.3 g/dL   Albumin 2.6 (*) 3.5 - 5.2 g/dL   AST 79 (*) 0 - 37 U/L   ALT 122 (*) 0 - 53 U/L   Alkaline Phosphatase 224 (*) 39 - 117 U/L   Total Bilirubin 1.9 (*) 0.3 - 1.2 mg/dL   GFR calc non Af Amer >90  >90 mL/min   GFR calc Af Amer >90  >90 mL/min  LIPASE, BLOOD      Component Value Range   Lipase 582 (*) 11 - 59 U/L  URINALYSIS, ROUTINE W REFLEX MICROSCOPIC      Component Value Range   Color, Urine YELLOW  YELLOW   APPearance CLEAR  CLEAR   Specific Gravity, Urine 1.012  1.005 - 1.030   pH 6.0  5.0 - 8.0   Glucose, UA 250 (*) NEGATIVE mg/dL   Hgb urine dipstick NEGATIVE  NEGATIVE   Bilirubin Urine NEGATIVE  NEGATIVE   Ketones, ur NEGATIVE  NEGATIVE mg/dL   Protein, ur NEGATIVE  NEGATIVE mg/dL   Urobilinogen, UA 0.2  0.0 - 1.0 mg/dL   Nitrite NEGATIVE  NEGATIVE   Leukocytes, UA NEGATIVE  NEGATIVE  PROTIME-INR      Component Value Range   Prothrombin Time 13.1  11.6 - 15.2 seconds   INR 1.00  0.00 - 1.49  APTT      Component Value Range   aPTT 39 (*) 24 -  37 seconds  BASIC METABOLIC PANEL      Component Value Range   Sodium 131 (*) 135 - 145 mEq/L   Potassium 4.6  3.5 - 5.1 mEq/L   Chloride 99  96 - 112 mEq/L   CO2 23  19 - 32 mEq/L   Glucose, Bld 86  70 - 99 mg/dL   BUN 20  6 - 23 mg/dL   Creatinine, Ser 8.65  0.50 - 1.35 mg/dL   Calcium 8.5  8.4 - 78.4 mg/dL   GFR calc non Af Amer >90  >90 mL/min   GFR calc Af Amer >90  >90 mL/min   Results for orders placed during the hospital encounter of 05/28/12  CBC WITH DIFFERENTIAL      Component Value Range   WBC 6.6  4.0 - 10.5 K/uL   RBC 5.40  4.22 - 5.81 MIL/uL   Hemoglobin 15.0  13.0 - 17.0 g/dL   HCT 69.6  29.5 - 28.4 %   MCV 80.7  78.0 - 100.0 fL   MCH 27.8  26.0 - 34.0 pg   MCHC 34.4  30.0 - 36.0 g/dL   RDW 13.2  44.0 - 10.2 %   Platelets 273  150 - 400 K/uL   Neutrophils Relative 47  43 - 77 %   Neutro Abs 3.1  1.7 - 7.7 K/uL   Lymphocytes Relative 30  12 - 46 %   Lymphs Abs 2.0  0.7 - 4.0 K/uL   Monocytes Relative 16 (*) 3 - 12 %   Monocytes Absolute 1.0  0.1 - 1.0 K/uL   Eosinophils Relative 7 (*) 0 - 5 %   Eosinophils Absolute 0.4  0.0 - 0.7 K/uL   Basophils Relative 2 (*) 0 - 1 %   Basophils Absolute 0.1  0.0 - 0.1 K/uL  COMPREHENSIVE METABOLIC PANEL      Component Value Range   Sodium 129 (*) 135 - 145 mEq/L   Potassium 5.6 (*) 3.5 - 5.1 mEq/L   Chloride 97  96 - 112 mEq/L   CO2 21  19 - 32 mEq/L   Glucose, Bld 102 (*) 70 - 99 mg/dL   BUN  22  6 - 23 mg/dL   Creatinine, Ser 7.25  0.50 - 1.35 mg/dL   Calcium 9.5  8.4 - 36.6 mg/dL   Total Protein 7.4  6.0 - 8.3 g/dL   Albumin 2.6 (*) 3.5 - 5.2 g/dL   AST 79 (*) 0 - 37 U/L   ALT 122 (*) 0 - 53 U/L   Alkaline Phosphatase 224 (*) 39 - 117 U/L   Total Bilirubin 1.9 (*) 0.3 - 1.2 mg/dL   GFR calc non Af Amer >90  >90 mL/min   GFR calc Af Amer >90  >90 mL/min  LIPASE, BLOOD      Component Value Range   Lipase 582 (*) 11 - 59 U/L  URINALYSIS, ROUTINE W REFLEX MICROSCOPIC      Component Value Range   Color, Urine YELLOW  YELLOW   APPearance CLEAR  CLEAR   Specific Gravity, Urine 1.012  1.005 - 1.030   pH 6.0  5.0 - 8.0   Glucose, UA 250 (*) NEGATIVE mg/dL   Hgb urine dipstick NEGATIVE  NEGATIVE   Bilirubin Urine NEGATIVE  NEGATIVE   Ketones, ur NEGATIVE  NEGATIVE mg/dL   Protein, ur NEGATIVE  NEGATIVE mg/dL   Urobilinogen, UA 0.2  0.0 - 1.0 mg/dL   Nitrite NEGATIVE  NEGATIVE  Leukocytes, UA NEGATIVE  NEGATIVE  PROTIME-INR      Component Value Range   Prothrombin Time 13.1  11.6 - 15.2 seconds   INR 1.00  0.00 - 1.49  APTT      Component Value Range   aPTT 39 (*) 24 - 37 seconds  BASIC METABOLIC PANEL      Component Value Range   Sodium 131 (*) 135 - 145 mEq/L   Potassium 4.6  3.5 - 5.1 mEq/L   Chloride 99  96 - 112 mEq/L   CO2 23  19 - 32 mEq/L   Glucose, Bld 86  70 - 99 mg/dL   BUN 20  6 - 23 mg/dL   Creatinine, Ser 1.47  0.50 - 1.35 mg/dL   Calcium 8.5  8.4 - 82.9 mg/dL   GFR calc non Af Amer >90  >90 mL/min   GFR calc Af Amer >90  >90 mL/min   Ct Chest W Contrast  05/09/2012  *RADIOLOGY REPORT*  Clinical Data: Rule out metastatic disease. Recent history of biliary obstruction due to a mass.  Jaundice and epigastric pain.  CT CHEST WITH CONTRAST  Technique:  Multidetector CT imaging of the chest was performed following the standard protocol during bolus administration of intravenous contrast.  Contrast: 80mL OMNIPAQUE IOHEXOL 300 MG/ML  SOLN  Comparison: No  priors.  Findings:  Mediastinum: Heart size is mildly enlarged. There is no significant pericardial fluid, thickening or pericardial calcification. There is atherosclerosis of the thoracic aorta, the great vessels of the mediastinum and the coronary arteries, including calcified atherosclerotic plaque in the left main, left anterior descending and right coronary arteries. Probable coronary artery stents in the proximal left anterior descending and distal right coronary arteries.  There is a 1.4 x 2.0 cm subcarinal node or a conglomerate of lymph nodes.  No other pathologically enlarged mediastinal or hilar lymph nodes are noted.  Esophagus is unremarkable in appearance.  Lungs/Pleura: No definite suspicious appearing pulmonary nodules or masses are identified on today's examination.  There is extensive air space consolidation and some volume loss throughout the lower lobes of the lungs bilaterally (right greater than left), concerning for pneumonia or sequelae of aspiration.  Trace right pleural effusion layering dependently.  Upper Abdomen: Biliary stents in place leading from the right hepatic ducts into the common bile duct (incompletely visualized). High attenuation material within the gallbladder from recent percutaneous cholangiogram.  Small amount of pneumobilia. Potential filling defects in the left branch of the portal vein, which is suspicious for incomplete portal vein thrombosis (image 47 of series 2).  Musculoskeletal: There are no aggressive appearing lytic or blastic lesions noted in the visualized portions of the skeleton.  IMPRESSION: 1.  No definite findings to suggest metastatic disease to the chest. 2.  There is an enlarged subcarinal lymph node or cluster of small subcarinal lymph nodes, as above, which may simply be reactive. Attention on follow-up studies may be warranted. 3.  Bibasilar air space consolidation and some degree of bilateral lower lobe atelectasis (right greater than left),  concerning for multilobar pneumonia and/or sequelae of aspiration. 3. 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 4.  Atherosclerosis, including left main and two-vessel coronary artery disease.  Probable coronary artery stents in the proximal left anterior descending and distal right coronary artery territories. 5. Post procedural changes in the upper abdomen, as above.   Original Report Authenticated By: Florencia Reasons, M.D.  Ct Abdomen Pelvis W Contrast  05/28/2012  *RADIOLOGY REPORT*  Clinical Data: Severe abdominal pain.  CT ABDOMEN AND PELVIS WITH CONTRAST  Technique:  Multidetector CT imaging of the abdomen and pelvis was performed following the standard protocol during bolus administration of intravenous contrast.  Contrast: OMNIPAQUE IOHEXOL 300 MG/ML  SOLN  Comparison: CT abdomen pelvis 05/01/2012.  Findings: Lung bases show atelectasis in both lower lobes.  Heart size within normal limits.  No pericardial or pleural effusion.  A percutaneous drain courses through the common bile duct, terminating in the duodenum.  There is resultant pneumobilia.  A stone is seen in the fundus of the gallbladder.  Adrenal glands are unremarkable.  Sub centimeter low attenuation lesions in the kidneys are too small to characterize.  Spleen is unremarkable. Pancreatic duct is dilated, measuring 4 mm.  No pancreatic atrophy. Stomach and bowel are unremarkable.  Small bilateral inguinal hernias contain fat.  Right inguinal hernia contains a small portion of the bladder.  Inguinal lymph nodes are borderline enlarged, measuring up to 10 mm on the left. Atherosclerotic calcification of the arterial vasculature. Infrarenal aorta measures up to 3 cm.  No free fluid.  No worrisome lytic or sclerotic lesions.  IMPRESSION:  1.  Percutaneous biliary drain in place, terminating in the duodenum, with pneumobilia. Together with pancreatic ductal  dilatation, an ampullary lesion is questioned. 2.  Cholelithiasis. 3.  Infrarenal aortic aneurysm. 4.  Bilateral inguinal hernias.   Original Report Authenticated By: Reyes Ivan, M.D.    Ct Abdomen Pelvis W Contrast  05/01/2012  *RADIOLOGY REPORT*  Clinical Data: Epigastric pain, pancreatitis.  CT ABDOMEN AND PELVIS WITH CONTRAST  Technique:  Multidetector CT imaging of the abdomen and pelvis was performed following the standard protocol during bolus administration of intravenous contrast.  Contrast: OMNIPAQUE IOHEXOL 300 MG/ML  SOLN plain films of the abdomen 05/01/2012  Comparison: Plain films of the abdomen 05/01/2012  Findings: The heart is normal in size.  Possible coronary stent versus coronary calcification.  There is dilatation of the intrahepatic, extrahepatic biliary ducts and the pancreatic duct, which taper abruptly at the level of the ampulla.  This raises the question of and ampullary lesion such as a benign or malignant stricture.  Large gallstone is present in the gallbladder.  No focal hepatic lesion identified.  No focal abnormality identified within the spleen, pancreas, or adrenal glands.  Small low attenuation lesions are identified within the kidneys, most consistent with cysts.  No evidence for hydronephrosis.  The stomach and small bowel loops have a normal appearance.  There are scattered colonic diverticula but no CT evidence for acute diverticulitis.  The appendix is not seen.  No inflammatory changes are seen is the chest presence of acute appendicitis.  No retroperitoneal or mesenteric adenopathy.  There is atherosclerotic calcification of the aorta.  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 There is a small right inguinal hernia, containing anterior aspect of the bladder and mesenteric fat.  IMPRESSION:  1.  Biliary and pancreatic duct dilatation to the level of the ampulla, raising the question of benign or malignant ampullary  stricture. 2.  No contour deforming or abnormal density mass within the pancreatic head.  Further evaluation with ERCP may be helpful. 3.  Coronary stent versus coronary calcification. 4.  Cholelithiasis. 5.  Probable small renal cysts. 6.  Diverticulosis. 7.  Right inguinal hernia containing a portion of the bladder.   Original Report Authenticated By: Lanora Manis  D. Manson Passey, M.D.    Ir Cholan Exist Tube  05/09/2012  *RADIOLOGY REPORT*  IR CHOLANGIOGRAM; IR INTERNALIZATION OF EXTERNAL BILIARY DRAIN  Date: 05/09/2012  Clinical History: 66 year old male with distal biliary obstruction and obstructive jaundice.  An externalized percutaneous transhepatic biliary drain was placed in the distal bile duct above the obstruction, however the patient is bilirubin continues to rise.  He presents to interventional radiology for cholangiogram followed by possible internalization of a biliary drain if the obstructing lesion cannot be crossed versus upsizing of the existing biliary drain which is not.  The adequately draining the system.  Procedures Performed: 1. Sheath cholangiogram 2.  Successful crossing of the distal biliary obstruction 3.  Placement of a 10 French internal/external transhepatic biliary drain  Interventional Radiologist:  Sterling Big, MD  Sedation: Moderate (conscious) sedation was used.  Two mg Versed, 100 mcg Fentanyl and 2 mg Dilaudid were administered intravenously. The patient's vital signs were monitored continuously by radiology nursing throughout the procedure.  Sedation Time: 25 minutes  Fluoroscopy time: 12.2  Contrast volume: 30 ml Omnipaque-300 administered into the biliary system  PROCEDURE/FINDINGS:   Informed consent was obtained from the patient following explanation of the procedure, risks, benefits and alternatives. The patient understands, agrees and consents for the procedure. All questions were addressed. A time out was performed.  Maximal barrier sterile technique utilized  including caps, mask, sterile gowns, sterile gloves, large sterile drape, hand hygiene, and betadine skin prep.  The existing external 8-French biliary drain was transected and a wire advanced into the distal common bile duct.  The drain was then removed over the wire.  A 9-French vascular sheath was advanced over a wire and positioned in the central right posterior hepatic duct.  A formal cholangiogram was then performed by injecting contrast material through the sheath.  The cholangiogram was obtained in multiple oblique images.  There is mild intra and extrahepatic biliary ductal dilatation with complete obstruction of the distal common bile duct.  The cystic duct is patent.  Both the left, and right biliary tree communicate, no amputated ducts or evidence of undrained hepatic segments.  No cholangiogram findings to suggest primary biliary cirrhosis, or other cholangiopathy.  An angled catheter was then advanced over the wire and using an angled Glidewire, the distal biliary obstruction was successfully crossed.  A superstiff Amplatz wire was then advanced into the proximal small bowel.  A 10-French [place] biliary drainage catheter was then advanced over the wire and the locking loop was positioned in the proximal duodenum. A final hand injection of contrast material through the tube confirmed tube replacement, as well as internal/external drainage.  The catheter was secured with 0-prolene suture and connected to gravity bag drainage.  The patient tolerated the procedure well, there is no immediate complication.  IMPRESSION:  1.  Percutaneous transhepatic cholangiogram demonstrates excellent communication between the left and right intrahepatic biliary systems, as well as patency of the cystic duct.  There is a complete obstruction of the distal common bile duct just proximal to the ampulla of Vater.  There is mild - moderate intra and extrahepatic biliary ductal dilatation.  2. Successful crossing of the distal  biliary obstruction followed by placement of a 10 French internal/external transhepatic biliary drain.  The drain should be left to external drainage until the serum bilirubin has sufficiently decreased.  Once it is clear that the drainage is adequate, the tube can be capped and the bile allowed to drain through the tube into the  duodenum.  3.  If the biopsy results come back as malignant, the cholangiogram findings suggest that the patient would be a candidate for placement of a metallic biliary stent via a transhepatic approach if they are not a surgical candidate. If the obstruction is benign, we could consider serial cholangioplasty.  These results were called by telephone on 05/09/2012 at 04:00 p.m. to Dr. Evette Cristal, who verbally acknowledged these results.  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  Failed ERCP. Now with obstructive jaundice.  ULTRASOUND AND FLUOROSCOIC GUIDED PERCUTANEOUS TRANSHEPATIC CHOLANGIOGRAM AND BILIARY TUBE PLACEMENT  Comparisons: CT abdomen pelvis - 05/01/2012; abdominal MRCP - 05/05/2012  Intravenous Medications: Versed 10 mg IV; Fentanyl 300 mcg IV; Dilaudid 6 mg IV; The patient is currently admitted to the hospital on intravenous antibiotics.  Intravenous antibiotics have been administered with an appropriate time frame prior to the initiation of the procedure.  Contrast: 50ml Omnipaque-300  Total Moderate Sedation Time: 90 minutes  Fluoroscopy Time: 28.1 minutes.  Complications: None immediate  Technique:  Informed written consent was obtained from the the patient's daughter after a discussion of the risks, benefits and alternatives to treatment.  Questions regarding the procedure were encouraged and answered.  A timeout was performed prior to the  initiation of the procedure.  The right upper abdominal quadrant was prepped and draped in the usual sterile fashion, and a sterile drape was applied covering the operative field.  Maximum barrier sterile technique with sterile gowns and gloves were used for the procedure.  A timeout was performed prior to the initiation of the procedure.  Ultrasound scanning of the left lobe of the liver failed to delineate dilatation of the peripheral left biliary ducts.  A nondilated biliary duct was attempted to be accessed percutaneously with a 21-gauge Chiba needle after overlying soft tissues were anesthestized with 1% lidocaine, however this optimally proved unsuccessful.  Ultrasound scanning of the right upper abdominal quadrant was performed to all of the delineate the anatomy and avoid transgression of the pleural. Note was made of marked dilatation of the gallbladder nearly obstructing access to the right lobe of the liver.  A spot along the right mid axillary line was marked fluoroscopically inferior to the right costophrenic angle.  A pass was made into the hepatic parenchyma with a 22 gauge Chiba needle after the overlying soft tissues were anesthetized 1% lidocaine with epinephrine however puncture of the gallbladder was confirmed with injection of a small amount of contrast.  Despite the injection of additional contrast into the gallbladder, no contrast would pass beyond the level of the cystic duct.  Several additional passes were made into the posterior segment of the right lobe of the liver with eventual opacification of a nondilated peripheral biliary duct.  This allowed for access of this non dilated duct with an additional 22 gauge Chiba needle. Appropriate biliary puncture was confirmed with the advancement of a Nitrex wire centrally and dilatation of the tract with an Accustick set.  Several spot radiographic fluoroscopic images were obtained in various obliquities confirming appropriate access.  The  Accustick catheter was exchanged for a Kumpe catheter over a [employer] wire.  Contrast injection was performed at the level of the distal CBD.   Limited attempts were made to pass the distal CBD obstruction with both a regular  and stiff Glidewire, however this ultimately proved unsuccessful.  Over an Amplatz stiff wire, the tract was dilated and an 8 [place] biliary drainage catheter was advanced with coil ultimately locked within the distal CBD.  Contrast was injected and a completion radiograph was obtained.  The catheter was connected to a drainage bag which yielded the brisk return of dark bile.  The catheter was secured to the skin with an interrupted suture.  The patient tolerated the procedure well without immediate postprocedural complication.  Findings:  The preprocedural sonographic imaging failed to demonstrate significant dilatation of the peripheral biliary ducts as was demonstrated on preprocedural cross-sectional imaging.  Limited attempts to access the slightly atrophic nondilated left hepatic lobe ultimately proved unsuccessful.  Ultrasound scanning of the right lobe of the liver demonstrated marked distension of the gallbladder and during the initial attempt to access the right lobe of the liver, the gallbladder was inadvertently punctured.  With the gallbladder now delineated, a peripheral right posterior nondilated duct was eventually opacified and ultimately cannulated.  Contrast injection from the distal CBD demonstrates complete obstruction at the level of the ampulla.  Limited attempts to traverse the obstruction proved unsuccessful.  As such, an 8 [place] percutaneous biliary drainage catheter was positioned with coil locked within the distal common bile duct.  Impression:  Successful placement of an 8 French percutaneous biliary drainage catheter with end coiled and locked within the obstructed distal CBD.  Plan:  The patient will return after at least 2 days of external biliary decompression  for attempted traversal of the distal CBD obstruction and internalization of the biliary drain.  Above findings discussed with Dr. Evette Cristal at the time of procedure completion.   Original Report Authenticated By: Waynard Reeds, M.D.    Ir Biliary Drain Catheter Placement  05/09/2012  *RADIOLOGY REPORT*  IR CHOLANGIOGRAM; IR INTERNALIZATION OF EXTERNAL BILIARY DRAIN  Date: 05/09/2012  Clinical History: 66 year old male with distal biliary obstruction and obstructive jaundice.  An externalized percutaneous transhepatic biliary drain was placed in the distal bile duct above the obstruction, however the patient is bilirubin continues to rise.  He presents to interventional radiology for cholangiogram followed by possible internalization of a biliary drain if the obstructing lesion cannot be crossed versus upsizing of the existing biliary drain which is not.  The adequately draining the system.  Procedures Performed: 1. Sheath cholangiogram 2.  Successful crossing of the distal biliary obstruction 3.  Placement of a 10 French internal/external transhepatic biliary drain  Interventional Radiologist:  Sterling Big, MD  Sedation: Moderate (conscious) sedation was used.  Two mg Versed, 100 mcg Fentanyl and 2 mg Dilaudid were administered intravenously. The patient's vital signs were monitored continuously by radiology nursing throughout the procedure.  Sedation Time: 25 minutes  Fluoroscopy time: 12.2  Contrast volume: 30 ml Omnipaque-300 administered into the biliary system  PROCEDURE/FINDINGS:   Informed consent was obtained from the patient following explanation of the procedure, risks, benefits and alternatives. The patient understands, agrees and consents for the procedure. All questions were addressed. A time out was performed.  Maximal barrier sterile technique utilized including caps, mask, sterile gowns, sterile gloves, large sterile drape, hand hygiene, and betadine skin prep.  The existing external  8-French biliary drain was transected and a wire advanced into the distal common bile duct.  The drain was then removed over the wire.  A 9-French vascular sheath was advanced over a wire and positioned in the central right posterior hepatic duct.  A formal  cholangiogram was then performed by injecting contrast material through the sheath.  The cholangiogram was obtained in multiple oblique images.  There is mild intra and extrahepatic biliary ductal dilatation with complete obstruction of the distal common bile duct.  The cystic duct is patent.  Both the left, and right biliary tree communicate, no amputated ducts or evidence of undrained hepatic segments.  No cholangiogram findings to suggest primary biliary cirrhosis, or other cholangiopathy.  An angled catheter was then advanced over the wire and using an angled Glidewire, the distal biliary obstruction was successfully crossed.  A superstiff Amplatz wire was then advanced into the proximal small bowel.  A 10-French Cook biliary drainage catheter was then advanced over the wire and the locking loop was positioned in the proximal duodenum. A final hand injection of contrast material through the tube confirmed tube replacement, as well as internal/external drainage.  The catheter was secured with 0-prolene suture and connected to gravity bag drainage.  The patient tolerated the procedure well, there is no immediate complication.  IMPRESSION:  1.  Percutaneous transhepatic cholangiogram demonstrates excellent communication between the left and right intrahepatic biliary systems, as well as patency of the cystic duct.  There is a complete obstruction of the distal common bile duct just proximal to the ampulla of Vater.  There is mild - moderate intra and extrahepatic biliary ductal dilatation.  2. Successful crossing of the distal biliary obstruction followed by placement of a 10 French internal/external transhepatic biliary drain.  The drain should be left to  external drainage until the serum bilirubin has sufficiently decreased.  Once it is clear that the drainage is adequate, the tube can be capped and the bile allowed to drain through the tube into the duodenum.  3.  If the biopsy results come back as malignant, the cholangiogram findings suggest that the patient would be a candidate for placement of a metallic biliary stent via a transhepatic approach if they are not a surgical candidate. If the obstruction is benign, we could consider serial cholangioplasty.  These results were called by telephone on 05/09/2012 at 04:00 p.m. to Dr. Evette Cristal, who verbally acknowledged these results.  Signed,  Sterling Big, MD Vascular & Interventional Radiologist [facility]   Original Report Authenticated By: Vilma Prader    Mr 3d Recon At Scanner  05/05/2012  *RADIOLOGY REPORT*  Clinical Data:  Pancreatitis, evaluate for CBD stone  MRI ABDOMEN WITHOUT AND WITH CONTRAST (INCLUDING MRCP)  Technique:  Multiplanar multisequence MR imaging of the abdomen was performed both before and after the administration of intravenous contrast. Heavily T2-weighted images of the biliary and pancreatic ducts were obtained, and three-dimensional MRCP images were rendered by post processing.  Contrast: 14mL MULTIHANCE GADOBENATE DIMEGLUMINE 529 MG/ML IV SOLN  Comparison:  CT abdomen pelvis dated 05/01/2012  Findings:  Motion degraded images.  Mild intrahepatic ductal dilatation.  Common duct measures up to 14 mm and tapers abruptly at the ampulla.  Mild pancreatic ductal dilatation measuring up to 7 mm and tapering abruptly at the ampulla.  Cholelithiasis.  No choledocholithiasis is seen.  Narrowing/wall thickening involving the second portion of the duodenum (series 3/images 25 and 27) and extending to the ampulla (series 3/image 30).  This appearance is worrisome for neoplasm (series 11/image 21), less likely focal inflammatory changes related to known pancreatitis.  It is unclear whether  the abnormality involves the adjacent pancreatic head/uncinate process.  The celiac artery, SMA/SMV, and portal vein all appear uninvolved.  No abdominal ascites.  No  suspicious abdominal lymphadenopathy.  No focal osseous lesions.  IMPRESSION: Intrahepatic/extrahepatic and pancreatic ductal dilatation, as described above, tapering abruptly at the ampulla.  Focal wall thickening involving the second portion of the duodenum and extending to the ampulla, as described above, worrisome for neoplasm.  Cholelithiasis.  No choledocholithiasis is seen.  EUS is suggested for further evaluation.  These results will be called to the ordering clinician or representative by the Radiologist Assistant, and communication documented in the PACS Dashboard.   Original Report Authenticated By: Charline Bills, M.D.    Nm Myocar Multi W/spect W/wall Motion / Ef  05/14/2012  *RADIOLOGY REPORT*  Clinical Data:  Chest pain  MYOCARDIAL IMAGING WITH SPECT (REST AND PHARMACOLOGIC-STRESS) GATED LEFT VENTRICULAR WALL MOTION STUDY LEFT VENTRICULAR EJECTION FRACTION  Technique:  Standard myocardial SPECT imaging was performed after resting intravenous injection of 10 mCi Tc-56m tetrofosmin. Subsequently, intravenous infusion of Lexiscan was performed under the supervision of the Cardiology staff.  At peak effect of the drug, 30 mCi Tc-19m tetrofosmin was injected intravenously and standard myocardial SPECT  imaging was performed.  Quantitative gated imaging was also performed to evaluate left ventricular wall motion, and estimate left ventricular ejection fraction.  Comparison:  None  Findings: SPECT imaging demonstrates fixed defect in the inferolateral wall compatible with old infarct/scar.  No significant reversibility to suggest ischemia.  Quantitative gated analysis shows hypokinesia of the septum. Akinesia within the area of scar in the inferolateral wall.  The resting left ventricular ejection fraction is 56% with end- diastolic volume  of 62 ml and end-systolic volume of 27 ml.  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  No evidence of ischemia.  Ejection fraction 56%.   Original Report Authenticated By: Cyndie Chime, M.D.    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  Failed ERCP. Now with obstructive jaundice.  ULTRASOUND AND FLUOROSCOIC GUIDED PERCUTANEOUS TRANSHEPATIC CHOLANGIOGRAM AND BILIARY TUBE PLACEMENT  Comparisons: CT abdomen pelvis - 05/01/2012; abdominal MRCP - 05/05/2012  Intravenous Medications: Versed 10 mg IV; Fentanyl 300 mcg IV; Dilaudid 6 mg IV; The patient is currently admitted to the hospital on intravenous antibiotics.  Intravenous antibiotics have been administered with an appropriate time frame prior to the initiation of the procedure.  Contrast: 50ml Omnipaque-300  Total Moderate Sedation Time: 90 minutes  Fluoroscopy Time: 28.1 minutes.  Complications: None immediate  Technique:  Informed written consent was obtained from the the patient's daughter after a discussion of the risks, benefits and alternatives to treatment.  Questions regarding the procedure were encouraged and answered.  A timeout was performed prior to the initiation of the procedure.  The right upper abdominal quadrant was prepped and draped in the usual sterile fashion, and a sterile drape was applied covering the operative field.  Maximum barrier sterile technique with sterile gowns and gloves were used for the procedure.  A timeout was performed prior to the initiation of the procedure.  Ultrasound scanning of the left lobe of the liver failed to delineate dilatation of the peripheral left biliary ducts.  A nondilated biliary duct was attempted to be accessed percutaneously with a 21-gauge Chiba needle after overlying soft tissues were anesthestized with 1% lidocaine, however this optimally proved unsuccessful.  Ultrasound scanning of the right upper abdominal  quadrant was performed to all of the delineate the anatomy and avoid transgression of the pleural. Note was made of marked dilatation of the gallbladder nearly obstructing access to the right lobe  of the liver.  A spot along the right mid axillary line was marked fluoroscopically inferior to the right costophrenic angle.  A pass was made into the hepatic parenchyma with a 22 gauge Chiba needle after the overlying soft tissues were anesthetized 1% lidocaine with epinephrine however puncture of the gallbladder was confirmed with injection of a small amount of contrast.  Despite the injection of additional contrast into the gallbladder, no contrast would pass beyond the level of the cystic duct.  Several additional passes were made into the posterior segment of the right lobe of the liver with eventual opacification of a nondilated peripheral biliary duct.  This allowed for access of this non dilated duct with an additional 22 gauge Chiba needle. Appropriate biliary puncture was confirmed with the advancement of a Nitrex wire centrally and dilatation of the tract with an Accustick set.  Several spot radiographic fluoroscopic images were obtained in various obliquities confirming appropriate access.  The Accustick catheter was exchanged for a Kumpe catheter over a [employer] wire.  Contrast injection was performed at the level of the distal CBD.   Limited attempts were made to pass the distal CBD obstruction with both a regular and stiff Glidewire, however this ultimately proved unsuccessful.  Over an Amplatz stiff wire, the tract was dilated and an 8 [place] biliary drainage catheter was advanced with coil ultimately locked within the distal CBD.  Contrast was injected and a completion radiograph was obtained.  The catheter was connected to a drainage bag which yielded the brisk return of dark bile.  The catheter was secured to the skin with an interrupted suture.  The patient tolerated the procedure well without immediate  postprocedural complication.  Findings:  The preprocedural sonographic imaging failed to demonstrate significant dilatation of the peripheral biliary ducts as was demonstrated on preprocedural cross-sectional imaging.  Limited attempts to access the slightly atrophic nondilated left hepatic lobe ultimately proved unsuccessful.  Ultrasound scanning of the right lobe of the liver demonstrated marked distension of the gallbladder and during the initial attempt to access the right lobe of the liver, the gallbladder was inadvertently punctured.  With the gallbladder now delineated, a peripheral right posterior nondilated duct was eventually opacified and ultimately cannulated.  Contrast injection from the distal CBD demonstrates complete obstruction at the level of the ampulla.  Limited attempts to traverse the obstruction proved unsuccessful.  As such, an 8 [place] percutaneous biliary drainage catheter was positioned with coil locked within the distal common bile duct.  Impression:  Successful placement of an 8 French percutaneous biliary drainage catheter with end coiled and locked within the obstructed distal CBD.  Plan:  The patient will return after at least 2 days of external biliary decompression for attempted traversal of the distal CBD obstruction and internalization of the biliary drain.  Above findings discussed with Dr. Evette Cristal at the time of procedure completion.   Original Report Authenticated By: Waynard Reeds, M.D.    Dg Abd 2 Views  05/13/2012  *RADIOLOGY REPORT*  Clinical Data: 66 year old male abdominal pain and distention. Percutaneous biliary drain.  ABDOMEN - 2 VIEW  Comparison: 05/09/2012 and earlier.  Findings: No pneumoperitoneum on the left side down lateral decubitus views.  Percutaneous transhepatic biliary drain re- identified.  Increased small and large bowel gas, but no abnormally dilated loops are identified and gas is present to the level of the rectum. Stable visualized osseous  structures.  IMPRESSION: 1. Increased bowel gas which might reflect ileus, but no evidence of  mechanical obstruction.  No free air. 2.  Transhepatic biliary drain in place.   Original Report Authenticated By: Harley Hallmark, M.D.    Dg Abd Acute W/chest  05/01/2012  *RADIOLOGY REPORT*  Clinical Data: Chest pain, abdominal pain  ACUTE ABDOMEN SERIES (ABDOMEN 2 VIEW & CHEST 1 VIEW)  Comparison: None.  Findings: Normal cardiac silhouette.  A fine reticular nodular pattern of left or right lower lobe.  Lungs are clear.  Lungs are hyperinflated.  There are no dilated loops of large or small bowel. Gas filled loops of large and small bowel.  There is gas in the rectum.  No pathologic calcifications.  IMPRESSION:  1. Reticular nodular pattern in the lower lobes suggest interstitial edema or infection.  Fine pulmonary edema could have similar pattern. 2.  No evidence of bowel obstruction.  Gas filled loops of large and small bowel.   Original Report Authenticated By: Genevive Bi, M.D.    Mr Abd W/wo Cm/mrcp  05/05/2012  *RADIOLOGY REPORT*  Clinical Data:  Pancreatitis, evaluate for CBD stone  MRI ABDOMEN WITHOUT AND WITH CONTRAST (INCLUDING MRCP)  Technique:  Multiplanar multisequence MR imaging of the abdomen was performed both before and after the administration of intravenous contrast. Heavily T2-weighted images of the biliary and pancreatic ducts were obtained, and three-dimensional MRCP images were rendered by post processing.  Contrast: 14mL MULTIHANCE GADOBENATE DIMEGLUMINE 529 MG/ML IV SOLN  Comparison:  CT abdomen pelvis dated 05/01/2012  Findings:  Motion degraded images.  Mild intrahepatic ductal dilatation.  Common duct measures up to 14 mm and tapers abruptly at the ampulla.  Mild pancreatic ductal dilatation measuring up to 7 mm and tapering abruptly at the ampulla.  Cholelithiasis.  No choledocholithiasis is seen.  Narrowing/wall thickening involving the second portion of the duodenum (series  3/images 25 and 27) and extending to the ampulla (series 3/image 30).  This appearance is worrisome for neoplasm (series 11/image 21), less likely focal inflammatory changes related to known pancreatitis.  It is unclear whether the abnormality involves the adjacent pancreatic head/uncinate process.  The celiac artery, SMA/SMV, and portal vein all appear uninvolved.  No abdominal ascites.  No suspicious abdominal lymphadenopathy.  No focal osseous lesions.  IMPRESSION: Intrahepatic/extrahepatic and pancreatic ductal dilatation, as described above, tapering abruptly at the ampulla.  Focal wall thickening involving the second portion of the duodenum and extending to the ampulla, as described above, worrisome for neoplasm.  Cholelithiasis.  No choledocholithiasis is seen.  EUS is suggested for further evaluation.  These results will be called to the ordering clinician or representative by the Radiologist Assistant, and communication documented in the PACS Dashboard.   Original Report Authenticated By: Charline Bills, M.D.       Shelda Jakes, MD 05/28/12 [phone]

## 2012-05-28 NOTE — ED Notes (Addendum)
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  Pt is on pain medications yet are not working.  LBM last Sunday. Denies N/V.  Pt c/o of tightness and swelling to abdomen.

## 2012-05-30 ENCOUNTER — Ambulatory Visit (HOSPITAL_COMMUNITY)
Admission: RE | Admit: 2012-05-30 | Discharge: 2012-05-30 | Disposition: A | Discharge status: Home / Self Care | Payer: Medicaid Other | Source: Ambulatory Visit | Attending: Cardiovascular Disease | Admitting: Cardiovascular Disease

## 2012-05-30 DIAGNOSIS — K831 Obstruction of bile duct: Secondary | ICD-10-CM

## 2012-05-30 MED ORDER — IOHEXOL 300 MG/ML  SOLN
50.0000 mL | Freq: Once | INTRAMUSCULAR | Status: AC | PRN
Start: 2012-05-30 — End: 2012-05-30
  Administered 2012-05-30: 20 mL

## 2012-06-02 ENCOUNTER — Encounter (INDEPENDENT_AMBULATORY_CARE_PROVIDER_SITE_OTHER): Payer: Self-pay | Admitting: General Surgery

## 2012-06-02 ENCOUNTER — Ambulatory Visit (INDEPENDENT_AMBULATORY_CARE_PROVIDER_SITE_OTHER): Payer: PRIVATE HEALTH INSURANCE | Admitting: General Surgery

## 2012-06-02 VITALS — BP 146/90 | HR 115 | Temp 98.2°F | Resp 18 | Ht 66.0 in | Wt 145.2 lb

## 2012-06-02 DIAGNOSIS — C241 Malignant neoplasm of ampulla of Vater: Secondary | ICD-10-CM | POA: Insufficient documentation

## 2012-06-02 DIAGNOSIS — K315 Obstruction of duodenum: Secondary | ICD-10-CM

## 2012-06-02 DIAGNOSIS — K311 Adult hypertrophic pyloric stenosis: Secondary | ICD-10-CM | POA: Insufficient documentation

## 2012-06-02 DIAGNOSIS — K831 Obstruction of bile duct: Secondary | ICD-10-CM

## 2012-06-02 DIAGNOSIS — C801 Malignant (primary) neoplasm, unspecified: Secondary | ICD-10-CM

## 2012-06-02 NOTE — Patient Instructions (Signed)
WHIPPLE (PANCREATICODUODENECTOMY) THE OPERATION: The goal of the operation is to remove masses in the head of the pancreas, the distal bile duct, or the duodenum.  The structures that are removed include the head of the pancreas, the duodenum, the gallbladder, and a small portion of your stomach.  The jejunum, or middle part of the small intestine, is then used to reconnect the bile duct, the pancreas, and the stomach to the intestinal tract.  The reason so much needs to be removed is that the blood supply and lymphatic channels and nodes are closely interconnected.  The operation takes between 4 and 8 hours depending on the amount of scar tissue you have present from prior surgeries, or from the mass.    WHAT HAPPENS IN THE OPERATING ROOM:  This means we will make small incisions to see if there is visible cancer outside of the pancreas.  If there is visible spread of cancer, I will NOT remove the tumor because research has shown that survival for pancreatic cancer is worse if you undergo a big operation without being able to remove all the cancer.  Sometimes the tumor is too adherent to the arteries and veins supplying the liver and small intestine that it is unsafe or not possible to remove the tumor.  If this is the case,  the tumor would be left in place.  In your case, I will do a bypass of the tumor even if we are not able to remove the tumor so that you can eat.    Whatever the operative findings, I will discuss the case with your family after we are done in the operating room.  I will talk to you in the next few days when you are more awake.  I will see you in the hospital every weekday that I am not out of town.  My partners help see patients on the weekends and if I am out of town.    WHAT HAPPENS AFTER SURGERY: After surgery, you will go first to the recovery room, then to the ICU for careful monitoring.  It is important that I am able to know your vital signs and urine output to see how  you are doing.  Depending on many factors, you may be in the ICU overnight, or for several days.  You will have many tubes and lines in place which is standard for this operation.  You will have several IVs, including possibly a central line in your chest or neck.  You will likely have an arterial line in your wrist.  You will have a tube in your nose for 4-5 days in order to suction out the stomach and liver secretions to help the surgical connections heal.  YOU WILL NOT BE ABLE TO EAT FOR AT LEAST 4-5 DAYS AFTER SURGERY.  You will have a catheter in your bladder.  On your abdomen, you will have several surgical drains and possibly a pain pump with numbing medicine.  You will have compression stockings on your legs to decrease the risk of blood clots.    Sometimes anesthesia makes a decision that you may need to be left on the breathing machine for a period after surgery.  This may be based on your overall health status, or events during surgery.    We will address your pain in several ways.  We will use an IV pain pump called a "PCA," or Patient Controlled Analgesia.  This allows you to press a button and immediately receive  a dose of pain medication without waiting for a nurse.  We also use IV Tylenol and sometimes IV Toradol which is similar to ibuprofen.  You may also have a pump with numbing medicine delivered directly to your incision.  I use doses and medications that work for the majority of people, but you may need an adjustment to the dose or type of medicine if your pain is not adequately controlled.  Your throat may be sore, in which case you may need a throat spray or lozenges.    We will ask you to get out of bed the day after surgery in order to maximize your chances of not having complications.  Your risk of pneumonia and blood clots is lower with walking and sitting in the chair.  We will also ask you to perform breathing exercises.  We will also ask to you walk in your room and in the halls  for the above reasons, but also in order for you to keep up your strength.    EATING: We will usually start you on clear liquids in around 5 days if your bowel function seems to have returned.  We advance your diet slowly to make sure you are tolerating each step.  All patients do not have a normal appetite when they go home and usually have to take 2-4 cans of nutritional supplement per day while this is improving.  Most patients also find that their taste buds do not seem the same right after surgery, and this can continue into the time of possible post operative chemotherapy and radiation.  Some patients develop diabetes and will need assistance from a primary care doctor for medication.    OTHER TREATMENT? I will present your case when the pathology is available at our GI Multidisciplinary conference which occurs every 1-2 weeks.  Medical and Radiation Oncologists are present, as well as the pathologist and radiologist in addition to myself.  If you have a larger tumor or if you have positive lymph nodes, we may have the oncologist stop by to see you while you are in the hospital.  Most firm post op treatment plans occur, however, once you are an outpatient because we will need to see how you are recovering from surgery.  GOING HOME! Usually you are able to go home in 8-14 days, depending on whether or not complications happen and what is going on with your overall health status.   If you have more health problems or if you have limited help at home, the therapists and nurses may recommend a temporary rehab or nursing facility to help you get back on your feet before you go home.  These decisions would be made while you are in the hospital with the assistance of a social worker or case manager.    Please bring all insurance/disability forms to our office for the staff to fill out   POSSIBLE COMPLICATIONS This is a very extensive operation and includes complications listed  below: Bleeding Infection and possible wound complications such as hernia Damage to adjacent structures Leak of surgical connections, the worst of which is the connection between the intestine and the pancreas (20%) Possible need for other procedures, such as abscess drains in radiology or endoscopy.   Possible prolonged hospital stay Possible development of diabetes or worsening of current diabetes. (5-20%) Possible diarrhea from lack of pancreatic enzymes.   Prolonged fatigue/weakness/appetite MOST PATIENTS' ENERGY LEVEL IS NOT BACK TO NORMAL FOR AT LEAST 4-6 MONTHS.  OLDER PATIENTS  MAY FEEL WEAK FOR LONGER PERIODS OF TIME.   Difficulty with eating or post operative nausea (around 30%) Possible early recurrence of cancer Possible complications of your medical problems such as heart disease or arrhythmias. Death (less than 2%)  All possible complications are not listed, just the most common.    FURTHER INFORMATION? Please ask questions if you find something that we did not discuss in the office and would like more information.  If you would like another appointment if you have many questions or if your family members would like to come as well, please contact the office.     IF YOU ARE TAKING ASPIRIN, PLAVIX, COUMADIN, OR OTHER BLOOD THINNERS, LET us KNOW IMMEDIATELY SO WE CAN CONTACT YOUR PRESCRIBING HEALTH CARE PROVIDER TO HOLD THE MEDICATION FOR 5-7 DAYS BEFORE SURGERY

## 2012-06-02 NOTE — Progress Notes (Signed)
Chief Complaint  Patient presents with  . Routine Post Op    whipple    HISTORY: Patient is a 66 year old male whom I met around 3 weeks ago. He presented to the hospital with obstructive jaundice. He also had gastric outlet obstruction. Unfortunately, he had lost a significant amount of weight and his prealbumin was 7. We sent him home on TNA in order to improve his nutritional status. I haven't scheduled for a Whipple versus a gastrojejunostomy this Friday. He has been doing reasonably well. He does occasionally throw up if he drinks too much liquid. He is only able to keep down around one cup of liquid per day. He has not lost any additional weight since he was discharged from the hospital. His bilirubin has come down to 1.9 and he is no longer itching.  Biopsies were nondiagnostic. On EGD and EUS, the probe was not able to be traversed across the second portion of the duodenum.  Past Medical History  Diagnosis Date  . Hypertension   . High cholesterol   . Coronary artery disease     Past Surgical History  Procedure Date  . Ercp 05/07/2012    Procedure: ENDOSCOPIC RETROGRADE CHOLANGIOPANCREATOGRAPHY (ERCP);  Surgeon: [name], MD;  Location: [facility];  Service: Endoscopy;  Laterality: N/A;  Dr [name]  . Eus 05/07/2012    Procedure: UPPER ENDOSCOPIC ULTRASOUND (EUS) RADIAL;  Surgeon: [name], MD;  Location: [facility];  Service: Endoscopy;;  . Appendectomy     open, 25 yrs ago  . Coronary stent placement     2 stents placed    Current Outpatient Prescriptions  Medication Sig Dispense Refill  . acetaminophen (TYLENOL) 500 MG tablet Take 500 mg by mouth every 6 (six) hours as needed. pain      . ALPRAZolam (XANAX) 0.25 MG tablet Take 1 tablet (0.25 mg total) by mouth 3 (three) times daily.  90 tablet  1  . hydrOXYzine (ATARAX/VISTARIL) 10 MG tablet Take 1 tablet (10 mg total) by mouth 2 (two) times daily as needed for itching.  60 tablet  1  . lisinopril  (PRINIVIL,ZESTRIL) 20 MG tablet Take 20 mg by mouth daily.      . Multiple Vitamins-Minerals (CERTA-VITE PO) Take 1 tablet by mouth daily.      . nicotine (NICODERM CQ - DOSED IN MG/24 HOURS) 21 mg/24hr patch Place 1 patch onto the skin daily.  28 patch  1  . oxyCODONE (OXY IR/ROXICODONE) 5 MG immediate release tablet Take 1 tablet (5 mg total) by mouth every 4 (four) hours as needed.  60 tablet  0  . polyethylene glycol (MIRALAX / GLYCOLAX) packet Take 17 g by mouth daily.  30 each  1  . TPN ADULT Inject 2,500 mLs into the vein See admin instructions. Pt is on an tpn infusion. 1600 mls. Monday and Friday pt gets only lipids. Tuesday,wednesday,thursday,saturday, Sunday he gets the tpn infusion without lipids. [facility] for the patient.      . insulin aspart (NOVOLOG) 100 UNIT/ML injection Inject 0-9 Units into the skin every 6 (six) hours.  1 vial  1     No Known Allergies   No family history on file.   History   Social History  . Marital Status: Married    Spouse Name: N/A    Number of Children: N/A  . Years of Education: N/A   Social History Main Topics  . Smoking status: Current Every Day  Smoker -- 0.2 packs/day for 48 years    Types: Cigarettes  . Smokeless tobacco: Never Used  . Alcohol Use: No  . Drug Use: No  . Sexually Active: No   Other Topics Concern  . None   Social History Narrative  . None     REVIEW OF SYSTEMS - PERTINENT POSITIVES ONLY: 12 point review of systems negative other than HPI and PMH.  EXAM: Filed Vitals:   06/02/12 1359  BP: 146/90  Pulse: 115  Temp: 98.2 F (36.8 C)  Resp: 18    Gen:  No acute distress.  Well nourished and well groomed.   Neurological: Alert and oriented to person, place, and time. Coordination normal.  Head: Normocephalic and atraumatic.  Eyes: Conjunctivae are normal. Pupils are equal, round, and reactive to light. No scleral icterus.  Cardiovascular: Normal rate, regular  rhythm. Respiratory: Effort normal.  No respiratory distress.  GI: Soft. Bowel sounds are normal. The abdomen is soft and nontender.  There is no rebound and no guarding.  Musculoskeletal: Normal range of motion. Extremities are nontender.  Skin: Skin is warm and dry. No rash noted. No diaphoresis. No erythema. No pallor. No clubbing, cyanosis, or edema.   Psychiatric: Normal mood and affect. Behavior is normal. Judgment and thought content normal.    LABORATORY RESULTS: Available labs are reviewed  TBili 1.9 on last results, albumin up to 2.6 from 2.2  RADIOLOGY RESULTS: See E-Chart or I-Site for most recent results.  Images and reports are reviewed. IMPRESSION:  1. Percutaneous biliary drain in place, terminating in the  duodenum, with pneumobilia. Together with pancreatic ductal  dilatation, an ampullary lesion is questioned.  2. Cholelithiasis.  3. Infrarenal aortic aneurysm.  4. Bilateral inguinal hernias.    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  Reviewed risks of whipple and possible complications.  Reviewed post op course and restrictions.  This was done with interpreter present.  I discussed the surgery with the patient including diagrams of anatomy.  I discussed the potential for diagnostic laparoscopy.  In the case of pancreatic cancer, if spread of the disease is found, we will abort the procedure and not proceed with resection.  The rationale for this was discussed with the patient.  There has not been data to support resection of Stage IV disease in terms of survival benefit.    We discussed possible complications including: Potential of aborting procedure if tumor is invading the superior mesenteric or hepatic arteries Bleeding Infection and possible wound complications such as hernia Damage to adjacent structures Leak of  anastamoses, primarily pancreatic Possible need for other procedures Possible prolonged hospital stay Possible development of diabetes or worsening of current diabetes.  Possible pancreatic exocrine insufficiency Prolonged fatigue/weakness/appetite Difficulty with eating or post operative nausea Possible early recurrence of cancer   The patient understands and wishes to proceed.  The patient has been advised to turn in disability paperwork to our office.        20 minutes were spent in counseling.     Maudry Diego MD Surgical Oncology, General and Endocrine Surgery [facility]      Visit Diagnoses: 1. Duodenal stricture   2. Gastric outlet obstruction   3. Biliary obstruction due to malignant neoplasm     Primary Care Physician: [name], MD

## 2012-06-02 NOTE — Assessment & Plan Note (Signed)
Will plan gastrojejunostomy even if metastatic disease

## 2012-06-02 NOTE — Assessment & Plan Note (Addendum)
Plan whipple on Friday vs GJ.  Reviewed risks of whipple and possible complications.  Reviewed post op course and restrictions.  This was done with interpreter present.  I discussed the surgery with the patient including diagrams of anatomy.  I discussed the potential for diagnostic laparoscopy.  In the case of pancreatic cancer, if spread of the disease is found, we will abort the procedure and not proceed with resection.  The rationale for this was discussed with the patient.  There has not been data to support resection of Stage IV disease in terms of survival benefit.    We discussed possible complications including: Potential of aborting procedure if tumor is invading the superior mesenteric or hepatic arteries Bleeding Infection and possible wound complications such as hernia Damage to adjacent structures Leak of anastamoses, primarily pancreatic Possible need for other procedures Possible prolonged hospital stay Possible development of diabetes or worsening of current diabetes.  Possible pancreatic exocrine insufficiency Prolonged fatigue/weakness/appetite Difficulty with eating or post operative nausea Possible early recurrence of cancer   The patient understands and wishes to proceed.  The patient has been advised to turn in disability paperwork to our office.

## 2012-06-03 ENCOUNTER — Encounter (HOSPITAL_COMMUNITY): Payer: Self-pay

## 2012-06-03 ENCOUNTER — Ambulatory Visit (HOSPITAL_COMMUNITY)
Admission: RE | Admit: 2012-06-03 | Discharge: 2012-06-03 | Disposition: A | Discharge status: Home / Self Care | Payer: Medicaid Other | Source: Ambulatory Visit | Attending: General Surgery | Admitting: General Surgery

## 2012-06-03 ENCOUNTER — Encounter (HOSPITAL_COMMUNITY)
Admission: RE | Admit: 2012-06-03 | Discharge: 2012-06-03 | Disposition: A | Discharge status: Home / Self Care | Payer: Medicaid Other | Source: Ambulatory Visit | Attending: General Surgery | Admitting: General Surgery

## 2012-06-03 DIAGNOSIS — Z01811 Encounter for preprocedural respiratory examination: Secondary | ICD-10-CM | POA: Insufficient documentation

## 2012-06-03 DIAGNOSIS — Z01812 Encounter for preprocedural laboratory examination: Secondary | ICD-10-CM | POA: Insufficient documentation

## 2012-06-03 DIAGNOSIS — I10 Essential (primary) hypertension: Secondary | ICD-10-CM | POA: Insufficient documentation

## 2012-06-03 DIAGNOSIS — Z01818 Encounter for other preprocedural examination: Secondary | ICD-10-CM | POA: Insufficient documentation

## 2012-06-03 HISTORY — DX: Obstruction of bile duct: K83.1

## 2012-06-03 LAB — CBC WITH DIFFERENTIAL/PLATELET
Eosinophils Absolute: 0.4 10*3/uL (ref 0.0–0.7)
Hemoglobin: 15.6 g/dL (ref 13.0–17.0)
Lymphocytes Relative: 28 % (ref 12–46)
Lymphs Abs: 2.7 10*3/uL (ref 0.7–4.0)
MCH: 28.8 pg (ref 26.0–34.0)
Neutro Abs: 5.6 10*3/uL (ref 1.7–7.7)
Neutrophils Relative %: 57 % (ref 43–77)
Platelets: 250 10*3/uL (ref 150–400)
RBC: 5.42 MIL/uL (ref 4.22–5.81)
WBC: 9.7 10*3/uL (ref 4.0–10.5)

## 2012-06-03 LAB — APTT: aPTT: 29 seconds (ref 24–37)

## 2012-06-03 LAB — COMPREHENSIVE METABOLIC PANEL
ALT: 146 U/L — ABNORMAL HIGH (ref 0–53)
Alkaline Phosphatase: 261 U/L — ABNORMAL HIGH (ref 39–117)
Chloride: 95 mEq/L — ABNORMAL LOW (ref 96–112)
GFR calc Af Amer: 88 mL/min — ABNORMAL LOW (ref >90)
Glucose, Bld: 108 mg/dL — ABNORMAL HIGH (ref 70–99)
Potassium: 4.5 mEq/L (ref 3.5–5.1)
Sodium: 130 mEq/L — ABNORMAL LOW (ref 135–145)
Total Bilirubin: 1.6 mg/dL — ABNORMAL HIGH (ref 0.3–1.2)
Total Protein: 8.1 g/dL (ref 6.0–8.3)

## 2012-06-03 LAB — SURGICAL PCR SCREEN: Staphylococcus aureus: NEGATIVE

## 2012-06-03 LAB — ABO/RH: ABO/RH(D): A POS

## 2012-06-03 NOTE — Pre-Procedure Instructions (Signed)
20 Colin Castro  06/03/2012   Your procedure is scheduled on:  October 18  Report to [facility] at 08:00 AM.  Call this number if you have problems the morning of surgery: [phone]   Remember:   Do not eat and drink:After Midnight.  Clear liquids include soda, tea, black coffee, apple or grape juice, broth.  Take these medicines the morning of surgery with A SIP OF WATER: Xanax (if needed),  Oxycodone, Tylenol     Do not wear jewelry, make-up or nail polish.  Do not wear lotions, powders, or perfumes. You may wear deodorant.  Do not shave 48 hours prior to surgery. Men may shave face and neck.  Do not bring valuables to the hospital.  Contacts, dentures or bridgework may not be worn into surgery.  Leave suitcase in the car. After surgery it may be brought to your room.  For patients admitted to the hospital, checkout time is 11:00 AM the day of discharge.   Special Instructions: Shower using CHG 2 nights before surgery and the night before surgery.  If you shower the day of surgery use CHG.  Use special wash - you have one bottle of CHG for all showers.  You should use approximately 1/3 of the bottle for each shower.   Please read over the following fact sheets that you were given: Pain Booklet, Coughing and Deep Breathing, Blood Transfusion Information and Surgical Site Infection Prevention

## 2012-06-03 NOTE — Progress Notes (Signed)
Preop visit completed using [facility] interpreter. Requested that interpreter be set up day of surgery

## 2012-06-04 NOTE — Consult Note (Signed)
Anesthesia chart review: Patient is a 66 year old male scheduled for Whipple versus gastrojejunostomy on 06/06/2012 by Dr. Donell Beers. History includes malignant stricture of the common bile duct, HTN, hypercholesterolemia, CAD s/p stents X 2.  His Cardiologist is Dr. Orpah Cobb.  He is aware of planned procedure (see his 05/23/12 Discharge Summary note and also his Progress note from 05/14/12 that states, "May undergo surgery if needed.").  EKG on 05/01/2012 showed sinus rhythm, consider right atrial abnormality, inferior T wave abnormality, consider ischemia.  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 No evidence of  ischemia. Ejection fraction 56%.   CXR report from 06/03/12 showed: Right greater than left bibasilar airspace disease. Comparing to the scout image for the previous chest CT, this appears improved since 05/09/2012.   Labs noted.  Epic indicates that they were also noted by Dr. Donell Beers on 06/03/12.    If no significant change in his status then anticipate he can proceed as planned.  Shonna Chock, PA-C

## 2012-06-05 MED ORDER — SODIUM CHLORIDE 0.9 % IV SOLN
1.0000 g | INTRAVENOUS | Status: AC
  Administered 2012-06-06: 1 g via INTRAVENOUS
  Filled 2012-06-05 (×2): qty 1

## 2012-06-06 ENCOUNTER — Inpatient Hospital Stay (HOSPITAL_COMMUNITY)
Admission: RE | Admit: 2012-06-06 | Discharge: 2012-06-25 | DRG: 405 | Disposition: A | Discharge status: Home / Self Care | Payer: Medicaid Other | Source: Ambulatory Visit | Attending: General Surgery | Admitting: General Surgery

## 2012-06-06 ENCOUNTER — Inpatient Hospital Stay (HOSPITAL_COMMUNITY): Payer: Medicaid Other

## 2012-06-06 ENCOUNTER — Encounter (HOSPITAL_COMMUNITY): Payer: Self-pay | Admitting: Vascular Surgery

## 2012-06-06 ENCOUNTER — Encounter (HOSPITAL_COMMUNITY): Payer: Self-pay | Admitting: *Deleted

## 2012-06-06 ENCOUNTER — Encounter (HOSPITAL_COMMUNITY)
Admission: RE | Disposition: A | Discharge status: Home / Self Care | Payer: Self-pay | Source: Ambulatory Visit | Attending: General Surgery

## 2012-06-06 ENCOUNTER — Ambulatory Visit (HOSPITAL_COMMUNITY): Payer: Medicaid Other

## 2012-06-06 ENCOUNTER — Ambulatory Visit (HOSPITAL_COMMUNITY): Payer: Medicaid Other | Admitting: Vascular Surgery

## 2012-06-06 DIAGNOSIS — IMO0002 Reserved for concepts with insufficient information to code with codable children: Secondary | ICD-10-CM

## 2012-06-06 DIAGNOSIS — E871 Hypo-osmolality and hyponatremia: Secondary | ICD-10-CM | POA: Diagnosis not present

## 2012-06-06 DIAGNOSIS — B952 Enterococcus as the cause of diseases classified elsewhere: Secondary | ICD-10-CM | POA: Diagnosis not present

## 2012-06-06 DIAGNOSIS — K4020 Bilateral inguinal hernia, without obstruction or gangrene, not specified as recurrent: Secondary | ICD-10-CM | POA: Diagnosis present

## 2012-06-06 DIAGNOSIS — C259 Malignant neoplasm of pancreas, unspecified: Secondary | ICD-10-CM

## 2012-06-06 DIAGNOSIS — Z794 Long term (current) use of insulin: Secondary | ICD-10-CM

## 2012-06-06 DIAGNOSIS — J189 Pneumonia, unspecified organism: Secondary | ICD-10-CM | POA: Diagnosis not present

## 2012-06-06 DIAGNOSIS — R404 Transient alteration of awareness: Secondary | ICD-10-CM | POA: Diagnosis not present

## 2012-06-06 DIAGNOSIS — F17200 Nicotine dependence, unspecified, uncomplicated: Secondary | ICD-10-CM | POA: Diagnosis present

## 2012-06-06 DIAGNOSIS — I2510 Atherosclerotic heart disease of native coronary artery without angina pectoris: Secondary | ICD-10-CM | POA: Diagnosis present

## 2012-06-06 DIAGNOSIS — K651 Peritoneal abscess: Secondary | ICD-10-CM | POA: Diagnosis not present

## 2012-06-06 DIAGNOSIS — C801 Malignant (primary) neoplasm, unspecified: Secondary | ICD-10-CM

## 2012-06-06 DIAGNOSIS — K315 Obstruction of duodenum: Secondary | ICD-10-CM

## 2012-06-06 DIAGNOSIS — I719 Aortic aneurysm of unspecified site, without rupture: Secondary | ICD-10-CM | POA: Diagnosis present

## 2012-06-06 DIAGNOSIS — K311 Adult hypertrophic pyloric stenosis: Secondary | ICD-10-CM

## 2012-06-06 DIAGNOSIS — K660 Peritoneal adhesions (postprocedural) (postinfection): Secondary | ICD-10-CM | POA: Diagnosis present

## 2012-06-06 DIAGNOSIS — E7800 Pure hypercholesterolemia, unspecified: Secondary | ICD-10-CM | POA: Diagnosis present

## 2012-06-06 DIAGNOSIS — R4182 Altered mental status, unspecified: Secondary | ICD-10-CM

## 2012-06-06 DIAGNOSIS — Y836 Removal of other organ (partial) (total) as the cause of abnormal reaction of the patient, or of later complication, without mention of misadventure at the time of the procedure: Secondary | ICD-10-CM | POA: Diagnosis not present

## 2012-06-06 DIAGNOSIS — C241 Malignant neoplasm of ampulla of Vater: Principal | ICD-10-CM | POA: Diagnosis present

## 2012-06-06 DIAGNOSIS — K929 Disease of digestive system, unspecified: Secondary | ICD-10-CM | POA: Diagnosis not present

## 2012-06-06 DIAGNOSIS — K8020 Calculus of gallbladder without cholecystitis without obstruction: Secondary | ICD-10-CM | POA: Diagnosis present

## 2012-06-06 DIAGNOSIS — R7309 Other abnormal glucose: Secondary | ICD-10-CM | POA: Diagnosis present

## 2012-06-06 DIAGNOSIS — R197 Diarrhea, unspecified: Secondary | ICD-10-CM | POA: Diagnosis not present

## 2012-06-06 DIAGNOSIS — R188 Other ascites: Secondary | ICD-10-CM | POA: Diagnosis not present

## 2012-06-06 DIAGNOSIS — I10 Essential (primary) hypertension: Secondary | ICD-10-CM | POA: Diagnosis present

## 2012-06-06 DIAGNOSIS — Y9210 Unspecified residential institution as the place of occurrence of the external cause: Secondary | ICD-10-CM | POA: Diagnosis not present

## 2012-06-06 DIAGNOSIS — E876 Hypokalemia: Secondary | ICD-10-CM | POA: Diagnosis not present

## 2012-06-06 DIAGNOSIS — F29 Unspecified psychosis not due to a substance or known physiological condition: Secondary | ICD-10-CM | POA: Diagnosis not present

## 2012-06-06 DIAGNOSIS — E43 Unspecified severe protein-calorie malnutrition: Secondary | ICD-10-CM | POA: Diagnosis present

## 2012-06-06 DIAGNOSIS — E878 Other disorders of electrolyte and fluid balance, not elsewhere classified: Secondary | ICD-10-CM | POA: Diagnosis not present

## 2012-06-06 DIAGNOSIS — Z9861 Coronary angioplasty status: Secondary | ICD-10-CM

## 2012-06-06 DIAGNOSIS — D638 Anemia in other chronic diseases classified elsewhere: Secondary | ICD-10-CM | POA: Diagnosis present

## 2012-06-06 DIAGNOSIS — K560 Paralytic ileus: Secondary | ICD-10-CM | POA: Diagnosis not present

## 2012-06-06 HISTORY — PX: LYMPH NODE DISSECTION: SHX5087

## 2012-06-06 HISTORY — PX: PANCREATIC STENT PLACEMENT: SHX5539

## 2012-06-06 HISTORY — PX: WHIPPLE PROCEDURE: SHX2667

## 2012-06-06 HISTORY — PX: CHOLECYSTECTOMY: SHX55

## 2012-06-06 LAB — CBC
MCH: 27.6 pg (ref 26.0–34.0)
MCH: 27.7 pg (ref 26.0–34.0)
MCHC: 34 g/dL (ref 30.0–36.0)
MCV: 81.1 fL (ref 78.0–100.0)
Platelets: 172 10*3/uL (ref 150–400)
Platelets: 156 10*3/uL (ref 150–400)
RBC: 4.02 MIL/uL — ABNORMAL LOW (ref 4.22–5.81)
RBC: 4.04 MIL/uL — ABNORMAL LOW (ref 4.22–5.81)
RDW: 14.4 % (ref 11.5–15.5)
WBC: 13.4 10*3/uL — ABNORMAL HIGH (ref 4.0–10.5)

## 2012-06-06 LAB — BASIC METABOLIC PANEL
CO2: 20 mEq/L (ref 19–32)
Calcium: 7.9 mg/dL — ABNORMAL LOW (ref 8.4–10.5)
Creatinine, Ser: 0.81 mg/dL (ref 0.50–1.35)
GFR calc non Af Amer: >90 mL/min (ref >90)
Glucose, Bld: 160 mg/dL — ABNORMAL HIGH (ref 70–99)
Sodium: 131 mEq/L — ABNORMAL LOW (ref 135–145)

## 2012-06-06 LAB — GLUCOSE, CAPILLARY

## 2012-06-06 LAB — CREATININE, SERUM
Creatinine, Ser: 0.79 mg/dL (ref 0.50–1.35)
GFR calc non Af Amer: >90 mL/min (ref >90)

## 2012-06-06 SURGERY — WHIPPLE PROCEDURE
Anesthesia: General | Site: Abdomen | Wound class: Contaminated

## 2012-06-06 MED ORDER — SODIUM CHLORIDE 0.9 % IJ SOLN: 9.0000 mL | INTRAMUSCULAR | Status: DC | PRN

## 2012-06-06 MED ORDER — PHENYLEPHRINE HCL 10 MG/ML IJ SOLN
INTRAMUSCULAR | Status: DC | PRN
  Administered 2012-06-06 (×3): 80 ug via INTRAVENOUS
  Administered 2012-06-06 (×2): 40 ug via INTRAVENOUS

## 2012-06-06 MED ORDER — OXYCODONE HCL 5 MG PO TABS: 5.0000 mg | ORAL_TABLET | Freq: Once | ORAL | Status: DC | PRN

## 2012-06-06 MED ORDER — FENTANYL CITRATE 0.05 MG/ML IJ SOLN
INTRAMUSCULAR | Status: DC | PRN
  Administered 2012-06-06 (×5): 50 ug via INTRAVENOUS
  Administered 2012-06-06: 250 ug via INTRAVENOUS
  Administered 2012-06-06 (×2): 25 ug via INTRAVENOUS
  Administered 2012-06-06 (×3): 50 ug via INTRAVENOUS
  Administered 2012-06-06: 25 ug via INTRAVENOUS
  Administered 2012-06-06 (×3): 50 ug via INTRAVENOUS

## 2012-06-06 MED ORDER — HYDROMORPHONE HCL PF 1 MG/ML IJ SOLN: 0.2500 mg | INTRAMUSCULAR | Status: DC | PRN

## 2012-06-06 MED ORDER — ONDANSETRON HCL 4 MG/2ML IJ SOLN
4.0000 mg | Freq: Four times a day (QID) | INTRAMUSCULAR | Status: DC | PRN
  Administered 2012-06-19 – 2012-06-22 (×2): 4 mg via INTRAVENOUS
  Filled 2012-06-06 (×2): qty 2

## 2012-06-06 MED ORDER — 0.9 % SODIUM CHLORIDE (POUR BTL) OPTIME
TOPICAL | Status: DC | PRN
  Administered 2012-06-06: 1000 mL
  Administered 2012-06-06: 2000 mL

## 2012-06-06 MED ORDER — EVICEL 5 ML EX KIT
PACK | CUTANEOUS | Status: AC
  Filled 2012-06-06: qty 2

## 2012-06-06 MED ORDER — PANTOPRAZOLE SODIUM 40 MG IV SOLR
40.0000 mg | Freq: Every day | INTRAVENOUS | Status: DC
  Administered 2012-06-06 – 2012-06-22 (×17): 40 mg via INTRAVENOUS
  Filled 2012-06-06 (×20): qty 40

## 2012-06-06 MED ORDER — DIPHENHYDRAMINE HCL 12.5 MG/5ML PO ELIX
12.5000 mg | ORAL_SOLUTION | Freq: Four times a day (QID) | ORAL | Status: DC | PRN
  Filled 2012-06-06: qty 5

## 2012-06-06 MED ORDER — GLYCOPYRROLATE 0.2 MG/ML IJ SOLN
INTRAMUSCULAR | Status: DC | PRN
  Administered 2012-06-06: 0.6 mg via INTRAVENOUS

## 2012-06-06 MED ORDER — ALBUMIN HUMAN 5 % IV SOLN
INTRAVENOUS | Status: DC | PRN
  Administered 2012-06-06: 14:00:00 via INTRAVENOUS

## 2012-06-06 MED ORDER — MEPERIDINE HCL 25 MG/ML IJ SOLN: 6.2500 mg | INTRAMUSCULAR | Status: DC | PRN

## 2012-06-06 MED ORDER — ARTIFICIAL TEARS OP OINT
TOPICAL_OINTMENT | OPHTHALMIC | Status: DC | PRN
  Administered 2012-06-06: 1 via OPHTHALMIC

## 2012-06-06 MED ORDER — ACETAMINOPHEN 10 MG/ML IV SOLN
1000.0000 mg | Freq: Four times a day (QID) | INTRAVENOUS | Status: AC
  Filled 2012-06-06 (×3): qty 100

## 2012-06-06 MED ORDER — LIDOCAINE HCL (CARDIAC) 20 MG/ML IV SOLN
INTRAVENOUS | Status: DC | PRN
  Administered 2012-06-06: 100 mg via INTRAVENOUS

## 2012-06-06 MED ORDER — HYDROMORPHONE HCL PF 1 MG/ML IJ SOLN
INTRAMUSCULAR | Status: DC | PRN
  Administered 2012-06-06 (×2): 0.5 mg via INTRAVENOUS

## 2012-06-06 MED ORDER — ACETAMINOPHEN 10 MG/ML IV SOLN
1000.0000 mg | Freq: Four times a day (QID) | INTRAVENOUS | Status: AC
  Administered 2012-06-06 – 2012-06-07 (×4): 1000 mg via INTRAVENOUS
  Filled 2012-06-06 (×5): qty 100

## 2012-06-06 MED ORDER — ONDANSETRON HCL 4 MG/2ML IJ SOLN
4.0000 mg | Freq: Four times a day (QID) | INTRAMUSCULAR | Status: DC | PRN
  Filled 2012-06-06: qty 2

## 2012-06-06 MED ORDER — PHENYLEPHRINE HCL 10 MG/ML IJ SOLN
10.0000 mg | INTRAVENOUS | Status: DC | PRN
  Administered 2012-06-06: 25 ug/min via INTRAVENOUS

## 2012-06-06 MED ORDER — ONDANSETRON HCL 4 MG/2ML IJ SOLN: 4.0000 mg | Freq: Once | INTRAMUSCULAR | Status: DC | PRN

## 2012-06-06 MED ORDER — EVICEL 5 ML EX KIT
PACK | CUTANEOUS | Status: DC | PRN
  Administered 2012-06-06: 5 mL

## 2012-06-06 MED ORDER — ENOXAPARIN SODIUM 40 MG/0.4ML ~~LOC~~ SOLN
40.0000 mg | SUBCUTANEOUS | Status: DC
  Administered 2012-06-07 – 2012-06-15 (×9): 40 mg via SUBCUTANEOUS
  Filled 2012-06-06 (×13): qty 0.4

## 2012-06-06 MED ORDER — INSULIN ASPART 100 UNIT/ML ~~LOC~~ SOLN
0.0000 [IU] | SUBCUTANEOUS | Status: DC
  Administered 2012-06-06: 1 [IU] via SUBCUTANEOUS
  Administered 2012-06-06: 2 [IU] via SUBCUTANEOUS
  Administered 2012-06-07 – 2012-06-08 (×7): 1 [IU] via SUBCUTANEOUS

## 2012-06-06 MED ORDER — OXYCODONE HCL 5 MG/5ML PO SOLN: 5.0000 mg | Freq: Once | ORAL | Status: DC | PRN

## 2012-06-06 MED ORDER — BUPIVACAINE HCL (PF) 0.25 % IJ SOLN
INTRAMUSCULAR | Status: AC
  Filled 2012-06-06: qty 30

## 2012-06-06 MED ORDER — VECURONIUM BROMIDE 10 MG IV SOLR
INTRAVENOUS | Status: DC | PRN
  Administered 2012-06-06: 2 mg via INTRAVENOUS
  Administered 2012-06-06: 1 mg via INTRAVENOUS
  Administered 2012-06-06 (×3): 2 mg via INTRAVENOUS

## 2012-06-06 MED ORDER — ACETAMINOPHEN 10 MG/ML IV SOLN
INTRAVENOUS | Status: AC
  Filled 2012-06-06: qty 100

## 2012-06-06 MED ORDER — NEOSTIGMINE METHYLSULFATE 1 MG/ML IJ SOLN
INTRAMUSCULAR | Status: DC | PRN
  Administered 2012-06-06: 4 mg via INTRAVENOUS

## 2012-06-06 MED ORDER — ONDANSETRON HCL 4 MG/2ML IJ SOLN
INTRAMUSCULAR | Status: DC | PRN
  Administered 2012-06-06: 4 mg via INTRAVENOUS

## 2012-06-06 MED ORDER — MORPHINE SULFATE 2 MG/ML IJ SOLN
1.0000 mg | INTRAMUSCULAR | Status: DC | PRN
  Administered 2012-06-07: 4 mg via INTRAVENOUS
  Administered 2012-06-10: 3 mg via INTRAVENOUS
  Filled 2012-06-06 (×2): qty 2

## 2012-06-06 MED ORDER — NALOXONE HCL 0.4 MG/ML IJ SOLN
0.4000 mg | INTRAMUSCULAR | Status: DC | PRN
  Filled 2012-06-06: qty 1

## 2012-06-06 MED ORDER — PROPOFOL 10 MG/ML IV BOLUS
INTRAVENOUS | Status: DC | PRN
  Administered 2012-06-06: 100 mg via INTRAVENOUS

## 2012-06-06 MED ORDER — DIPHENHYDRAMINE HCL 50 MG/ML IJ SOLN
12.5000 mg | Freq: Four times a day (QID) | INTRAMUSCULAR | Status: DC | PRN
  Administered 2012-06-10: 12.5 mg via INTRAVENOUS
  Filled 2012-06-06: qty 0.25
  Filled 2012-06-06: qty 1

## 2012-06-06 MED ORDER — ROCURONIUM BROMIDE 100 MG/10ML IV SOLN
INTRAVENOUS | Status: DC | PRN
  Administered 2012-06-06: 100 mg via INTRAVENOUS

## 2012-06-06 MED ORDER — MORPHINE SULFATE (PF) 1 MG/ML IV SOLN
INTRAVENOUS | Status: DC
  Administered 2012-06-06: 17:00:00 via INTRAVENOUS
  Administered 2012-06-06: 9 mg via INTRAVENOUS
  Administered 2012-06-07: 15.52 mg via INTRAVENOUS
  Administered 2012-06-07: 17.8 mg via INTRAVENOUS
  Administered 2012-06-07: 10.5 mg via INTRAVENOUS
  Administered 2012-06-07: 27.8 mg via INTRAVENOUS
  Administered 2012-06-07: 10:00:00 via INTRAVENOUS
  Administered 2012-06-07: 7.4 mg via INTRAVENOUS
  Administered 2012-06-07: 13.5 mg via INTRAVENOUS
  Administered 2012-06-08 (×3): 10.5 mg via INTRAVENOUS
  Administered 2012-06-08: 13.5 mg via INTRAVENOUS
  Administered 2012-06-08: 15 mg via INTRAVENOUS
  Administered 2012-06-08: 15.9 mg via INTRAVENOUS
  Administered 2012-06-09: 9 mg via INTRAVENOUS
  Administered 2012-06-09: 12 mg via INTRAVENOUS
  Administered 2012-06-09: 3 mg via INTRAVENOUS
  Administered 2012-06-09: 13:00:00 via INTRAVENOUS
  Administered 2012-06-09: 10.5 mg via INTRAVENOUS
  Administered 2012-06-10: 16.5 mg via INTRAVENOUS
  Administered 2012-06-10: 10.5 mg via INTRAVENOUS
  Administered 2012-06-10: 07:00:00 via INTRAVENOUS
  Administered 2012-06-10: 22.5 mg via INTRAVENOUS
  Administered 2012-06-10: 19:00:00 via INTRAVENOUS
  Administered 2012-06-10: 22.5 mg via INTRAVENOUS
  Administered 2012-06-10: 19.43 mg via INTRAVENOUS
  Administered 2012-06-10 (×2): via INTRAVENOUS
  Administered 2012-06-10: 1.5 mg via INTRAVENOUS
  Administered 2012-06-11: 4.5 mg via INTRAVENOUS
  Administered 2012-06-11: 3 mg via INTRAVENOUS
  Administered 2012-06-11: 03:00:00 via INTRAVENOUS
  Administered 2012-06-11: 10.5 mg via INTRAVENOUS
  Administered 2012-06-11: 20:00:00 via INTRAVENOUS
  Administered 2012-06-11: 12 mg via INTRAVENOUS
  Administered 2012-06-11: 9 mg via INTRAVENOUS
  Administered 2012-06-12: 9.56 mg via INTRAVENOUS
  Administered 2012-06-12: 14.53 mg via INTRAVENOUS
  Administered 2012-06-12: 10.5 mg via INTRAVENOUS
  Administered 2012-06-12: 20.95 mg via INTRAVENOUS
  Administered 2012-06-12: 7.5 mg via INTRAVENOUS
  Administered 2012-06-12: 02:00:00 via INTRAVENOUS
  Administered 2012-06-12: 6 mg via INTRAVENOUS
  Administered 2012-06-13: 25 mg via INTRAVENOUS
  Administered 2012-06-13: 16.5 mg via INTRAVENOUS
  Administered 2012-06-13: 7.5 mg via INTRAVENOUS
  Administered 2012-06-13: 05:00:00 via INTRAVENOUS
  Administered 2012-06-14: 0.9 mg via INTRAVENOUS
  Administered 2012-06-14: 13.5 mg via INTRAVENOUS
  Administered 2012-06-14: 3 mg via INTRAVENOUS
  Administered 2012-06-14: 10.5 mg via INTRAVENOUS
  Administered 2012-06-14: 3 mg via INTRAVENOUS
  Filled 2012-06-06 (×22): qty 25

## 2012-06-06 MED ORDER — MORPHINE SULFATE (PF) 1 MG/ML IV SOLN
INTRAVENOUS | Status: AC
  Filled 2012-06-06: qty 25

## 2012-06-06 MED ORDER — MIDAZOLAM HCL 5 MG/5ML IJ SOLN
INTRAMUSCULAR | Status: DC | PRN
  Administered 2012-06-06: 2 mg via INTRAVENOUS

## 2012-06-06 MED ORDER — LACTATED RINGERS IV SOLN
INTRAVENOUS | Status: DC | PRN
  Administered 2012-06-06 (×5): via INTRAVENOUS

## 2012-06-06 MED ORDER — BUPIVACAINE 0.25 % ON-Q PUMP DUAL CATH 300 ML
300.0000 mL | INJECTION | Status: AC
  Administered 2012-06-06: 300 mL
  Filled 2012-06-06: qty 300

## 2012-06-06 MED ORDER — BUPIVACAINE ON-Q PAIN PUMP (FOR ORDER SET NO CHG)
INJECTION | Status: AC
  Filled 2012-06-06: qty 1

## 2012-06-06 MED ORDER — ONDANSETRON HCL 4 MG PO TABS: 4.0000 mg | ORAL_TABLET | Freq: Four times a day (QID) | ORAL | Status: DC | PRN

## 2012-06-06 MED ORDER — SODIUM CHLORIDE 0.9 % IV SOLN
1.0000 g | INTRAVENOUS | Status: AC
  Administered 2012-06-06: 1 g via INTRAVENOUS
  Filled 2012-06-06: qty 1

## 2012-06-06 MED ORDER — DEXTROSE-NACL 5-0.45 % IV SOLN
INTRAVENOUS | Status: DC
  Administered 2012-06-06: 100 mL via INTRAVENOUS
  Filled 2012-06-06: qty 1000

## 2012-06-06 SURGICAL SUPPLY — 110 items
BLADE SURG 11 STRL SS (BLADE) ×2 IMPLANT
BLADE SURG ROTATE 9660 (MISCELLANEOUS) ×2 IMPLANT
BOOT SUTURE AID YELLOW STND (SUTURE) ×4 IMPLANT
CANISTER SUCTION 2500CC (MISCELLANEOUS) ×2 IMPLANT
CATH KIT ON Q 7.5IN SLV (PAIN MANAGEMENT) ×4 IMPLANT
CATH ROBINSON RED A/P 16FR (CATHETERS) ×2 IMPLANT
CHLORAPREP W/TINT 26ML (MISCELLANEOUS) ×2 IMPLANT
CLIP LIGATING HEM O LOK PURPLE (MISCELLANEOUS) ×6 IMPLANT
CLIP LIGATING HEMO O LOK GREEN (MISCELLANEOUS) ×2 IMPLANT
CLIP LIGATING HEMOLOK MED (MISCELLANEOUS) ×4 IMPLANT
CLIP TI LARGE 6 (CLIP) ×2 IMPLANT
CLIP TI MEDIUM 24 (CLIP) ×2 IMPLANT
CLOTH BEACON ORANGE TIMEOUT ST (SAFETY) ×2 IMPLANT
CONT SPEC 4OZ CLIKSEAL STRL BL (MISCELLANEOUS) IMPLANT
COVER MAYO STAND STRL (DRAPES) ×2 IMPLANT
COVER SURGICAL LIGHT HANDLE (MISCELLANEOUS) ×2 IMPLANT
DRAIN CHANNEL 19F RND (DRAIN) ×4 IMPLANT
DRAIN PENROSE 1/2X36 STERILE (WOUND CARE) ×2 IMPLANT
DRAPE LAPAROSCOPIC ABDOMINAL (DRAPES) ×2 IMPLANT
DRAPE PROXIMA HALF (DRAPES) ×2 IMPLANT
DRAPE UTILITY 15X26 W/TAPE STR (DRAPE) ×6 IMPLANT
DRAPE WARM FLUID 44X44 (DRAPE) ×2 IMPLANT
DRESSING TELFA 8X3 (GAUZE/BANDAGES/DRESSINGS) IMPLANT
DRSG COVADERM 4X10 (GAUZE/BANDAGES/DRESSINGS) IMPLANT
DRSG COVADERM 4X14 (GAUZE/BANDAGES/DRESSINGS) IMPLANT
DRSG COVADERM 4X6 (GAUZE/BANDAGES/DRESSINGS) ×2 IMPLANT
DRSG COVADERM 4X8 (GAUZE/BANDAGES/DRESSINGS) ×2 IMPLANT
DRSG TEGADERM 4X4.75 (GAUZE/BANDAGES/DRESSINGS) ×4 IMPLANT
ELECT BLADE 4.0 EZ CLEAN MEGAD (MISCELLANEOUS) ×2
ELECT BLADE 6.5 EXT (BLADE) IMPLANT
ELECT CAUTERY BLADE 6.4 (BLADE) ×2 IMPLANT
ELECT PAD DSPR THERM+ ADLT (MISCELLANEOUS) ×2 IMPLANT
ELECT REM PT RETURN 9FT ADLT (ELECTROSURGICAL) ×2
ELECTRODE BLDE 4.0 EZ CLN MEGD (MISCELLANEOUS) ×1 IMPLANT
ELECTRODE REM PT RTRN 9FT ADLT (ELECTROSURGICAL) ×1 IMPLANT
EVACUATOR SILICONE 100CC (DRAIN) ×4 IMPLANT
GAUZE SPONGE 4X4 16PLY XRAY LF (GAUZE/BANDAGES/DRESSINGS) IMPLANT
GEL ULTRASOUND 20GR AQUASONIC (MISCELLANEOUS) ×2 IMPLANT
GLOVE BIO SURGEON STRL SZ 6 (GLOVE) ×4 IMPLANT
GLOVE BIO SURGEON STRL SZ7.5 (GLOVE) ×2 IMPLANT
GLOVE BIOGEL PI IND STRL 7.0 (GLOVE) ×2 IMPLANT
GLOVE BIOGEL PI IND STRL 7.5 (GLOVE) ×1 IMPLANT
GLOVE BIOGEL PI IND STRL 8 (GLOVE) ×2 IMPLANT
GLOVE BIOGEL PI INDICATOR 7.0 (GLOVE) ×2
GLOVE BIOGEL PI INDICATOR 7.5 (GLOVE) ×1
GLOVE BIOGEL PI INDICATOR 8 (GLOVE) ×2
GLOVE ECLIPSE 8.0 STRL XLNG CF (GLOVE) ×4 IMPLANT
GLOVE SS BIOGEL STRL SZ 7 (GLOVE) ×2 IMPLANT
GLOVE SUPERSENSE BIOGEL SZ 7 (GLOVE) ×2
GLOVE SURG SS PI 7.0 STRL IVOR (GLOVE) ×12 IMPLANT
GOWN PREVENTION PLUS XLARGE (GOWN DISPOSABLE) ×2 IMPLANT
GOWN PREVENTION PLUS XXLARGE (GOWN DISPOSABLE) ×4 IMPLANT
GOWN STRL NON-REIN LRG LVL3 (GOWN DISPOSABLE) ×6 IMPLANT
HAND PENCIL TRP OPTION (MISCELLANEOUS) IMPLANT
HEMOSTAT SURGICEL 2X14 (HEMOSTASIS) IMPLANT
KIT BASIN OR (CUSTOM PROCEDURE TRAY) ×2 IMPLANT
KIT ROOM TURNOVER OR (KITS) ×2 IMPLANT
LOOP VESSEL MAXI BLUE (MISCELLANEOUS) ×2 IMPLANT
NEEDLE BIOPSY 14X6 SOFT TISS (NEEDLE) IMPLANT
NS IRRIG 1000ML POUR BTL (IV SOLUTION) ×6 IMPLANT
PACK GENERAL/GYN (CUSTOM PROCEDURE TRAY) ×2 IMPLANT
PAD ARMBOARD 7.5X6 YLW CONV (MISCELLANEOUS) ×4 IMPLANT
PAD SHARPS MAGNETIC DISPOSAL (MISCELLANEOUS) IMPLANT
PLUG CATH AND CAP STER (CATHETERS) IMPLANT
RELOAD PROXIMATE 75MM BLUE (ENDOMECHANICALS) ×6 IMPLANT
SEPRAFILM PROCEDURAL PACK 3X5 (MISCELLANEOUS) ×2 IMPLANT
SHEARS FOC LG CVD HARMONIC 17C (MISCELLANEOUS) ×2 IMPLANT
SPONGE GAUZE 4X4 12PLY (GAUZE/BANDAGES/DRESSINGS) IMPLANT
SPONGE INTESTINAL PEANUT (DISPOSABLE) ×2 IMPLANT
SPONGE LAP 18X18 X RAY DECT (DISPOSABLE) ×10 IMPLANT
SPONGE SURGIFOAM ABS GEL 100 (HEMOSTASIS) IMPLANT
STAPLER PROXIMATE 75MM BLUE (STAPLE) ×2 IMPLANT
STAPLER VISISTAT 35W (STAPLE) ×2 IMPLANT
SUCTION POOLE TIP (SUCTIONS) ×2 IMPLANT
SUT 5.0 PDS RB-1 (SUTURE) ×7
SUT ETHILON 2 0 FS 18 (SUTURE) ×4 IMPLANT
SUT ETHILON 2 LR (SUTURE) ×2 IMPLANT
SUT PDS AB 1 TP1 96 (SUTURE) ×6 IMPLANT
SUT PDS AB 3-0 SH 27 (SUTURE) ×8 IMPLANT
SUT PDS AB 4-0 RB1 27 (SUTURE) ×24 IMPLANT
SUT PDS II 0 TP-1 LOOPED 60 (SUTURE) IMPLANT
SUT PDS PLUS AB 5-0 RB-1 (SUTURE) ×7 IMPLANT
SUT PROLENE 3 0 SH 48 (SUTURE) ×10 IMPLANT
SUT PROLENE 4 0 RB 1 (SUTURE) ×3
SUT PROLENE 4 0 SH DA (SUTURE) ×4 IMPLANT
SUT PROLENE 4-0 RB1 .5 CRCL 36 (SUTURE) ×3 IMPLANT
SUT PROLENE 5 0 RB 1 DA (SUTURE) ×4 IMPLANT
SUT SILK 2 0 SH CR/8 (SUTURE) ×6 IMPLANT
SUT SILK 2 0 TIES 10X30 (SUTURE) ×2 IMPLANT
SUT SILK 3 0 SH CR/8 (SUTURE) ×2 IMPLANT
SUT SILK 3 0 TIES 10X30 (SUTURE) ×2 IMPLANT
SUT VIC AB 2-0 CT1 27 (SUTURE) ×1
SUT VIC AB 2-0 CT1 TAPERPNT 27 (SUTURE) ×1 IMPLANT
SUT VIC AB 2-0 SH 18 (SUTURE) ×2 IMPLANT
SUT VIC AB 3-0 SH 18 (SUTURE) IMPLANT
SUT VIC AB 3-0 SH 27 (SUTURE) ×1
SUT VIC AB 3-0 SH 27X BRD (SUTURE) ×1 IMPLANT
SUT VIC AB 4-0 RB1 18 (SUTURE) IMPLANT
SUT VICRYL AB 2 0 TIES (SUTURE) ×2 IMPLANT
SYR 20CC LL (SYRINGE) ×2 IMPLANT
SYR BULB IRRIGATION 50ML (SYRINGE) ×2 IMPLANT
TAPE UMBILICAL 1/8 X36 TWILL (MISCELLANEOUS) ×2 IMPLANT
TOWEL OR 17X24 6PK STRL BLUE (TOWEL DISPOSABLE) ×4 IMPLANT
TOWEL OR 17X26 10 PK STRL BLUE (TOWEL DISPOSABLE) ×2 IMPLANT
TRAY FOLEY CATH 14FRSI W/METER (CATHETERS) ×2 IMPLANT
TUBE CONNECTING 12X1/4 (SUCTIONS) ×2 IMPLANT
TUBE FEEDING 5FR 15 INCH (TUBING) ×2 IMPLANT
TUNNELER SHEATH ON-Q 16GX12 DP (PAIN MANAGEMENT) ×2 IMPLANT
WATER STERILE IRR 1000ML POUR (IV SOLUTION) ×2 IMPLANT
YANKAUER SUCT BULB TIP NO VENT (SUCTIONS) ×2 IMPLANT

## 2012-06-06 NOTE — OR Nursing (Signed)
Xray completed due to incorrect needle count.  Prior to results being called to OR, needle was found making count correct.  Dr. Donell Beers notified.

## 2012-06-06 NOTE — Transfer of Care (Signed)
Immediate Anesthesia Transfer of Care Note  Patient: Colin Castro  Procedure(s) Performed: Procedure(s) (LRB) with comments: WHIPPLE PROCEDURE (N/A) - Pancreatico duodenectomy LYSIS OF ADHESION (N/A) CHOLECYSTECTOMY (N/A) PANCREATIC STENT PLACEMENT (N/A) - Removal of biliary stent; Placement of pacreatic stent LYMPH NODE DISSECTION (N/A) - Portal lymph node dissection  Patient Location: PACU  Anesthesia Type: General  Level of Consciousness: patient cooperative and responds to stimulation  Airway & Oxygen Therapy: Patient Spontanous Breathing and Patient connected to face mask oxygen  Post-op Assessment: Report given to PACU RN and Post -op Vital signs reviewed and stable  Post vital signs: Reviewed and stable  Complications: No apparent anesthesia complications

## 2012-06-06 NOTE — Anesthesia Postprocedure Evaluation (Signed)
Anesthesia Post Note  Patient: Colin Castro  Procedure(s) Performed: Procedure(s) (LRB): WHIPPLE PROCEDURE (N/A) LYSIS OF ADHESION (N/A) CHOLECYSTECTOMY (N/A) PANCREATIC STENT PLACEMENT (N/A) LYMPH NODE DISSECTION (N/A)  Anesthesia type: general  Patient location: PACU  Post pain: Pain level controlled  Post assessment: Patient's Cardiovascular Status Stable  Last Vitals:  Filed Vitals:   06/06/12 1706  BP:   Pulse: 87  Temp:   Resp: 20    Post vital signs: Reviewed and stable  Level of consciousness: sedated  Complications: No apparent anesthesia complications

## 2012-06-06 NOTE — H&P (View-Only) (Signed)
Chief Complaint  Patient presents with  . Routine Post Op    whipple    HISTORY: Patient is a 66-year-old male whom I met around 3 weeks ago. He presented to the hospital with obstructive jaundice. He also had gastric outlet obstruction. Unfortunately, he had lost a significant amount of weight and his prealbumin was 7. We sent him home on TNA in order to improve his nutritional status. I haven't scheduled for a Whipple versus a gastrojejunostomy this Friday. He has been doing reasonably well. He does occasionally throw up if he drinks too much liquid. He is only able to keep down around one cup of liquid per day. He has not lost any additional weight since he was discharged from the hospital. His bilirubin has come down to 1.9 and he is no longer itching.  Biopsies were nondiagnostic. On EGD and EUS, the probe was not able to be traversed across the second portion of the duodenum.  Past Medical History  Diagnosis Date  . Hypertension   . High cholesterol   . Coronary artery disease     Past Surgical History  Procedure Date  . Ercp 05/07/2012    Procedure: ENDOSCOPIC RETROGRADE CHOLANGIOPANCREATOGRAPHY (ERCP);  Surgeon: Salem F Ganem, MD;  Location: [facility];  Service: Endoscopy;  Laterality: N/A;  Dr Ganem/ebp  . Eus 05/07/2012    Procedure: UPPER ENDOSCOPIC ULTRASOUND (EUS) RADIAL;  Surgeon: Salem F Ganem, MD;  Location: [facility];  Service: Endoscopy;;  . Appendectomy     open, 25 yrs ago  . Coronary stent placement     2 stents placed    Current Outpatient Prescriptions  Medication Sig Dispense Refill  . acetaminophen (TYLENOL) 500 MG tablet Take 500 mg by mouth every 6 (six) hours as needed. pain      . ALPRAZolam (XANAX) 0.25 MG tablet Take 1 tablet (0.25 mg total) by mouth 3 (three) times daily.  90 tablet  1  . hydrOXYzine (ATARAX/VISTARIL) 10 MG tablet Take 1 tablet (10 mg total) by mouth 2 (two) times daily as needed for itching.  60 tablet  1  . lisinopril  (PRINIVIL,ZESTRIL) 20 MG tablet Take 20 mg by mouth daily.      . Multiple Vitamins-Minerals (CERTA-VITE PO) Take 1 tablet by mouth daily.      . nicotine (NICODERM CQ - DOSED IN MG/24 HOURS) 21 mg/24hr patch Place 1 patch onto the skin daily.  28 patch  1  . oxyCODONE (OXY IR/ROXICODONE) 5 MG immediate release tablet Take 1 tablet (5 mg total) by mouth every 4 (four) hours as needed.  60 tablet  0  . polyethylene glycol (MIRALAX / GLYCOLAX) packet Take 17 g by mouth daily.  30 each  1  . TPN ADULT Inject 2,500 mLs into the vein See admin instructions. Pt is on an tpn infusion. 1600 mls. Monday and Friday pt gets only lipids. Tuesday,wednesday,thursday,saturday, Sunday he gets the tpn infusion without lipids. [facility] provides [facility] for the patient.      . insulin aspart (NOVOLOG) 100 UNIT/ML injection Inject 0-9 Units into the skin every 6 (six) hours.  1 vial  1     No Known Allergies   No family history on file.   History   Social History  . Marital Status: Married    Spouse Name: N/A    Number of Children: N/A  . Years of Education: N/A   Social History Main Topics  . Smoking status: Current Every Day   Smoker -- 0.2 packs/day for 48 years    Types: Cigarettes  . Smokeless tobacco: Never Used  . Alcohol Use: No  . Drug Use: No  . Sexually Active: No   Other Topics Concern  . None   Social History Narrative  . None     REVIEW OF SYSTEMS - PERTINENT POSITIVES ONLY: 12 point review of systems negative other than HPI and PMH.  EXAM: Filed Vitals:   06/02/12 1359  BP: 146/90  Pulse: 115  Temp: 98.2 F (36.8 C)  Resp: 18    Gen:  No acute distress.  Well nourished and well groomed.   Neurological: Alert and oriented to person, place, and time. Coordination normal.  Head: Normocephalic and atraumatic.  Eyes: Conjunctivae are normal. Pupils are equal, round, and reactive to light. No scleral icterus.  Cardiovascular: Normal rate, regular  rhythm. Respiratory: Effort normal.  No respiratory distress.  GI: Soft. Bowel sounds are normal. The abdomen is soft and nontender.  There is no rebound and no guarding.  Musculoskeletal: Normal range of motion. Extremities are nontender.  Skin: Skin is warm and dry. No rash noted. No diaphoresis. No erythema. No pallor. No clubbing, cyanosis, or edema.   Psychiatric: Normal mood and affect. Behavior is normal. Judgment and thought content normal.    LABORATORY RESULTS: Available labs are reviewed  TBili 1.9 on last results, albumin up to 2.6 from 2.2  RADIOLOGY RESULTS: See E-Chart or I-Site for most recent results.  Images and reports are reviewed. IMPRESSION:  1. Percutaneous biliary drain in place, terminating in the  duodenum, with pneumobilia. Together with pancreatic ductal  dilatation, an ampullary lesion is questioned.  2. Cholelithiasis.  3. Infrarenal aortic aneurysm.  4. Bilateral inguinal hernias.    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  Reviewed risks of whipple and possible complications.  Reviewed post op course and restrictions.  This was done with interpreter present.  I discussed the surgery with the patient including diagrams of anatomy.  I discussed the potential for diagnostic laparoscopy.  In the case of pancreatic cancer, if spread of the disease is found, we will abort the procedure and not proceed with resection.  The rationale for this was discussed with the patient.  There has not been data to support resection of Stage IV disease in terms of survival benefit.    We discussed possible complications including: Potential of aborting procedure if tumor is invading the superior mesenteric or hepatic arteries Bleeding Infection and possible wound complications such as hernia Damage to adjacent structures Leak of  anastamoses, primarily pancreatic Possible need for other procedures Possible prolonged hospital stay Possible development of diabetes or worsening of current diabetes.  Possible pancreatic exocrine insufficiency Prolonged fatigue/weakness/appetite Difficulty with eating or post operative nausea Possible early recurrence of cancer   The patient understands and wishes to proceed.  The patient has been advised to turn in disability paperwork to our office.        20 minutes were spent in counseling.     Kewon Statler L Talis Iwan MD Surgical Oncology, General and Endocrine Surgery [facility]      Visit Diagnoses: 1. Duodenal stricture   2. Gastric outlet obstruction   3. Biliary obstruction due to malignant neoplasm     Primary Care Physician: KADAKIA,AJAY S, MD    

## 2012-06-06 NOTE — Op Note (Signed)
PREOPERATIVE DIAGNOSIS: Periampullary carcinoma, gastric outlet obstruction  POSTOPERATIVE DIAGNOSIS: Same.   PROCEDURES PERFORMED:  1. Classic pancreaticoduodenectomy with duct to mucosa pancreatic anastamosis 2. Placement of pancreatic stent  3.  Removal of biliary drain. 4.  Portal lymph node dissection. 5.  Placement of OnQ pain pump 6.  Lysis of adhesions x 45 minutes from previous open appendectomy  SURGEON: [name], MD   ASSISTANT: Estelle Grumbles, MD   ANESTHESIA: General  FINDINGS: 3 cm duodenal vs pancreatic head mass. Very firm pancreatic tissue. Pancreatic duct 6 mm.  Common bile duct 12 mm  SPECIMENS:  1. Pancreaticoduodenectomy with gallbladder: Findings on frozen section of the  pancreaticoduodenectomy were negative for high grade dysplasia or malignancy at the retroperitoneal, common  bile duct and pancreatic duct margin. Specimens all to Pathology.  2. Portal  nodes   ESTIMATED BLOOD LOSS: 500 mL.   COMPLICATIONS: None known.   PROCEDURE:   Pt was identified in the holding area and taken to  the operating room, and placed supine on the operating room  table. General anesthesia was induced. The patient's abdomen was  prepped and draped in a sterile fashion, after a Foley catheter was  placed. A time-out was performed according to the surgical safety check  list. When all was correct we continued.   A midline incision was made from the xiphoid to just above the umbilicus. This was extended laterally toward the right. The subcutaneous tissues were divided with the Bovie cautery. The peritoneum was entered in the center of the abdomen. Digital retraction was then used to elevate the preperitoneal fat, and  this was taken with the cautery as well. The subcutaneous tissues and  fascia of the muscular layers were taken laterally with the cautery.  Care was taken to protect the underlying viscera.   Once this had been performed, the point of the incision was  reflected up to the skin to  facilitate retraction. Bookwalter self-retaining retractor was placed  for visualization. The right colon was taken down off of the white line  of Toldt and from the retroperitoneum at the hepatic flexure. The porta was identified. The  duodenum was kocherized extensively with blunt dissection and with cautery. The gallbladder was taken off the liver with a combination of blunt dissection and cautery. The cystic duct was clipped with the Hemalock clips. The cystic duct was divided and the gallbladder was passed off. Numerous adhesions of the small bowel were divided in order to skeletonize the ligament of Treitz.  A total of 45 minutes were spent lysing adhesions.    The common bile duct was skeletonized near the duodenum. A vessel loop was passed around it. The gastroduodenal artery, as well as the common hepatic artery were skeletonized. The proper hepatic artery was traced out to make sure that flow was going to both sides of the liver when the GDA was clamped. The GDA was test clamped with the bulldog, with good flow to the liver and  no signs of ischemia. This was divided with 2-0 silk ties and then clipped. The proper hepatic artery was reflected upward, and the anterior portal vein was exposed.  A Kelly clamp was passed underneath the pancreas at the superior mesenteric vein, and this passed  easily with no signs of tumor involvement.   Attention was then directed to the stomach, and the omentum was taken  off of the stomach at the border of the antrum and the body. The  gastrohepatic ligament was taken down  with the harmonic, and care was  taken to make sure there was not a replaced left hepatic artery in this  location. The stomach was divided with the GIA-75 stapler. The border  of the stomach was oversewn with a 3-0 running PDS suture.   Attention was then directed to the small bowel. Around 10 cm past the  ligament of Treitz it was located, and this was  divided with the 75-GIA.  The distal portion of the jejunum was also oversewn with a 3-0 PDS  suture. The fourth portion of the duodenum was skeletonized with the  harmonic scalpel, taking down all of the mesenteric vessels. The  ligament of Treitz was taken down. The IMV was preserved.  The duodenum was then passed underneath the portal vein.   At this point the Tresa Endo was replaced and the pancreas was divided with the cautery. 2-0 silk sutures were tied down and the inferior and superior border of the pancreas. The Bovie was used to coagulate the small bleeders at the border of the pancreas.  The Overholt in combination with the harmonic and locking Weck clips  were then used to take the uncinate process off of the portal vein and  the superior mesenteric artery. Care was taken not to incorporate the  superior mesenteric artery in the dissection. The specimen was then marked and passed off the table for frozen section margin.    The jejunum was then passed underneath  the SMV, in order to get appropriate lie for the pancreatic and biliary  anastomoses. The more distal portion of the jejunum was pulled up over  the colon, and two 3-0 silks were placed through the posterior border  of the stomach for the gastrojejunostomy. The stomach and the small  bowel were opened, and a GIA-75 was used to create an end-to-end  anastomosis. The open areas of the staple line were examined to ensure  that there was hemostasis. The defect was then closed with a single  layer of running Connell suture of 3-0 PDS. Prior to a complete  closure, the NG tube was passed toward the afferent limb.   The appropriate location for the choledochojejunostomy was identified, and  the small bowel was opened approximately 12 mm.  This was done with  interrupted 4-0 PDS sutures. Approximately 13 sutures were used.  The 2 corner sutures were placed first  and then the posterior layer was done in an interrupted fashion tying  on  the inside. The superior layer was then closed with interrupted sutures as  well.   At this point the frozens returned back as all negative. The pancreatic  anastomosis was then created by opening the muscular layer of the jejunum the length  of the pancreatic parenchyma. The mucosa was opened around 6 mm.  The pancreas was very firm, and the duct dilated. A pediatric feeding tube was used as a pancreatic stent. The posterior layer was formed first with 2-0 silk sutures in interrupted  Fashion.  Five 5-0 PDS sutures were used to create the duct to mucosa pancreaticojejunostomy.  The anterior layer was then oversewn with 2-0 silks.   The areas were then irrigated and then those anastomoses were covered  with Evicel. This was allowed to dry.  The abdomen was then irrigated  again and all the laparotomy sponges were removed. A lap count was  performed, which was correct. Two 19-Blake drains were placed, with the  lateral-most drain placed behind the choledochojejunostomy. The medial  Blake drain  was placed just anterior and slightly superior to the  pancreaticojejunostomy. Seprafilm was placed. The fascia was then closed with #1 looped running PDS sutures. The lateral aspect was performed in 2 layers.  The skin was irrigated and then closed with  staples. The wounds were cleaned, dried and dressed with a sterile  dressing.   The patient tolerated the procedure well and was extubated and taken to  PACU in stable condition. Sponge counts were correct x2. Needle counts were off by one.  XRay performed.

## 2012-06-06 NOTE — Interval H&P Note (Signed)
History and Physical Interval Note:  06/06/2012 9:44 AM  Colin Castro  has presented today for surgery, with the diagnosis of malignant stricture of common bile duct.  The various methods of treatment have been discussed with the patient and family. After consideration of risks, benefits and other options for treatment, the patient has consented to  Procedure(s) (LRB) with comments: WHIPPLE PROCEDURE (N/A) - whipple vs gastrojejunostomy as a surgical intervention .  The patient's history has been reviewed, patient examined, no change in status, stable for surgery.  I have reviewed the patient's chart and labs.  Questions were answered via interpreter to the patient's satisfaction.     Bryona Foxworthy

## 2012-06-06 NOTE — Anesthesia Preprocedure Evaluation (Signed)
Anesthesia Evaluation  Patient identified by MRN, date of birth, ID band Patient awake    Reviewed: Allergy & Precautions, H&P , NPO status , Patient's Chart, lab work & pertinent test results  Airway Mallampati: I TM Distance: >3 FB Neck ROM: Full    Dental   Pulmonary          Cardiovascular hypertension, Pt. on medications + CAD and + Cardiac Stents     Neuro/Psych    GI/Hepatic   Endo/Other    Renal/GU      Musculoskeletal   Abdominal   Peds  Hematology   Anesthesia Other Findings   Reproductive/Obstetrics                           Anesthesia Physical Anesthesia Plan  ASA: III  Anesthesia Plan: General   Post-op Pain Management:    Induction: Intravenous  Airway Management Planned: Oral ETT  Additional Equipment: Arterial line and CVP  Intra-op Plan:   Post-operative Plan: Possible Post-op intubation/ventilation  Informed Consent: I have reviewed the patients History and Physical, chart, labs and discussed the procedure including the risks, benefits and alternatives for the proposed anesthesia with the patient or authorized representative who has indicated his/her understanding and acceptance.     Plan Discussed with: CRNA and Surgeon  Anesthesia Plan Comments:         Anesthesia Quick Evaluation

## 2012-06-06 NOTE — Preoperative (Signed)
Beta Blockers   Reason not to administer Beta Blockers:Not Applicable 

## 2012-06-07 DIAGNOSIS — R7309 Other abnormal glucose: Secondary | ICD-10-CM

## 2012-06-07 LAB — GLUCOSE, CAPILLARY
Glucose-Capillary: 137 mg/dL — ABNORMAL HIGH (ref 70–99)
Glucose-Capillary: 140 mg/dL — ABNORMAL HIGH (ref 70–99)
Glucose-Capillary: 128 mg/dL — ABNORMAL HIGH (ref 70–99)
Glucose-Capillary: 139 mg/dL — ABNORMAL HIGH (ref 70–99)

## 2012-06-07 LAB — COMPREHENSIVE METABOLIC PANEL
ALT: 190 U/L — ABNORMAL HIGH (ref 0–53)
AST: 141 U/L — ABNORMAL HIGH (ref 0–37)
Albumin: 2.4 g/dL — ABNORMAL LOW (ref 3.5–5.2)
CO2: 21 mEq/L (ref 19–32)
Calcium: 8 mg/dL — ABNORMAL LOW (ref 8.4–10.5)
Creatinine, Ser: 0.68 mg/dL (ref 0.50–1.35)
Sodium: 129 mEq/L — ABNORMAL LOW (ref 135–145)

## 2012-06-07 LAB — HEMOGLOBIN AND HEMATOCRIT, BLOOD: Hemoglobin: 11 g/dL — ABNORMAL LOW (ref 13.0–17.0)

## 2012-06-07 LAB — DIFFERENTIAL
Basophils Absolute: 0 10*3/uL (ref 0.0–0.1)
Eosinophils Relative: 0 % (ref 0–5)
Lymphocytes Relative: 11 % — ABNORMAL LOW (ref 12–46)
Lymphs Abs: 1.1 10*3/uL (ref 0.7–4.0)
Neutro Abs: 8 10*3/uL — ABNORMAL HIGH (ref 1.7–7.7)
Neutrophils Relative %: 81 % — ABNORMAL HIGH (ref 43–77)

## 2012-06-07 LAB — PHOSPHORUS: Phosphorus: 3.2 mg/dL (ref 2.3–4.6)

## 2012-06-07 LAB — CHOLESTEROL, TOTAL: Cholesterol: 104 mg/dL (ref 0–200)

## 2012-06-07 LAB — CBC
Platelets: 157 10*3/uL (ref 150–400)
RBC: 4.1 MIL/uL — ABNORMAL LOW (ref 4.22–5.81)
RDW: 14.5 % (ref 11.5–15.5)
WBC: 9.9 10*3/uL (ref 4.0–10.5)

## 2012-06-07 LAB — APTT: aPTT: 37 seconds (ref 24–37)

## 2012-06-07 LAB — MAGNESIUM: Magnesium: 1.7 mg/dL (ref 1.5–2.5)

## 2012-06-07 LAB — HEMOGLOBIN A1C
Hgb A1c MFr Bld: 6 % — ABNORMAL HIGH (ref <5.7)
Mean Plasma Glucose: 126 mg/dL — ABNORMAL HIGH (ref <117)

## 2012-06-07 LAB — TRIGLYCERIDES: Triglycerides: 82 mg/dL (ref <150)

## 2012-06-07 MED ORDER — SODIUM CHLORIDE 0.9 % IV SOLN
INTRAVENOUS | Status: AC
  Administered 2012-06-07: 1000 mL via INTRAVENOUS

## 2012-06-07 MED ORDER — SODIUM CHLORIDE 0.9 % IV SOLN
Freq: Once | INTRAVENOUS | Status: AC
  Administered 2012-06-07: 15:00:00 via INTRAVENOUS

## 2012-06-07 MED ORDER — SODIUM CHLORIDE 0.9 % IV SOLN
INTRAVENOUS | Status: DC
  Administered 2012-06-07: 19:00:00 via INTRAVENOUS

## 2012-06-07 MED ORDER — CLINIMIX E/DEXTROSE (5/20) 5 % IV SOLN
INTRAVENOUS | Status: AC
  Administered 2012-06-07: 18:00:00 via INTRAVENOUS
  Filled 2012-06-07: qty 1000

## 2012-06-07 MED ORDER — MAGNESIUM SULFATE 40 MG/ML IJ SOLN
2.0000 g | Freq: Once | INTRAMUSCULAR | Status: AC
  Administered 2012-06-07: 2 g via INTRAVENOUS
  Filled 2012-06-07: qty 50

## 2012-06-07 MED ORDER — HYDRALAZINE HCL 20 MG/ML IJ SOLN
10.0000 mg | Freq: Four times a day (QID) | INTRAMUSCULAR | Status: DC | PRN
  Filled 2012-06-07: qty 0.5

## 2012-06-07 NOTE — Progress Notes (Signed)
PARENTERAL NUTRITION CONSULT NOTE - INITIAL  Pharmacy Consult for TNA Indication: bowel rest s/p Whipple procedure  No Known Allergies  Patient Measurements: Weight: 154 lb 1.6 oz (69.899 kg) Height: 66 in. IBW: 64 kg  Vital Signs: Temp: 97.6 F (36.4 C) (10/19 0733) Temp src: Oral (10/19 0733) BP: 106/52 mmHg (10/19 0800) Pulse Rate: 87  (10/19 0900) Intake/Output from previous day: 10/18 0701 - 10/19 0700 In: 7020.9 [I.V.:6320.9; NG/GT:90; IV Piggyback:610] Out: 3631 [Urine:2665; Emesis/NG output:20; Drains:346; Blood:600] Intake/Output from this shift: Total I/O In: 395.5 [I.V.:315.5; NG/GT:30; IV Piggyback:50] Out: 210 [Urine:135; Drains:75]  Labs:  [facility] 06/07/12 0400 06/06/12 1800 06/06/12 1630  WBC 9.9 13.4* 14.2*  HGB 11.5* 11.2* 11.1*  HCT 32.9* 32.6* 32.6*  PLT 157 156 172  APTT 37 -- --  INR 1.15 -- --     Basename 06/07/12 0400 06/06/12 1800 06/06/12 1630  NA 129* -- 131*  K 4.1 -- 4.7  CL 98 -- 99  CO2 21 -- 20  GLUCOSE 153* -- 160*  BUN 13 -- 24*  CREATININE 0.68 0.79 0.81  LABCREA -- -- --  CREAT24HRUR -- -- --  CALCIUM 8.0* -- 7.9*  MG 1.7 -- --  PHOS 3.2 -- --  PROT 5.4* -- --  ALBUMIN 2.4* -- --  AST 141* -- --  ALT 190* -- --  ALKPHOS 124* -- --  BILITOT 2.1* -- --  BILIDIR -- -- --  IBILI -- -- --  PREALBUMIN -- -- --  TRIG 82 -- --  CHOLHDL -- -- --  CHOL 104 -- --   The CrCl is unknown because both a height and weight (above a minimum accepted value) are required for this calculation.    Basename 06/07/12 0732 06/07/12 0339 06/06/12 2321  GLUCAP 140* 137* 137*    Medical History: Past Medical History  Diagnosis Date  . Hypertension   . High cholesterol   . Coronary artery disease   . Common bile duct (CBD) stricture     Malignant    Medications:  Scheduled:    . acetaminophen  1,000 mg Intravenous Q6H  . acetaminophen  1,000 mg Intravenous Q6H  . bupivacaine 0.25 % ON-Q pump DUAL CATH 300 mL  300 mL Other  To OR  . enoxaparin  40 mg Subcutaneous Q24H  . ertapenem  1 g Intravenous 60 min Pre-Op  . ertapenem (INVANZ) IV  1 g Intravenous Q24H  . insulin aspart  0-9 Units Subcutaneous Q4H  . magnesium sulfate 1 - 4 g bolus IVPB  2 g Intravenous Once  . morphine   Intravenous Q4H  . morphine      . pantoprazole (PROTONIX) IV  40 mg Intravenous QHS   Infusions:    . sodium chloride 1,000 mL (06/07/12 0800)  . bupivacaine ON-Q pain pump    . DISCONTD: dextrose 5 % and 0.45% NaCl 100 mL/hr at 06/07/12 0400   Nutritional Goals:  Per pre-op RD recs 10/4: 1750-2100 Kcal/d and 85-105g protein/d. Clinimix E 5/20 at 154ml/hr + IVFE 20% at 49ml/hr MWF to provide: 120g/day protein and 2592Kcal/day MWF, 2112Kcal/day STTHS(Avg. 2318Kcal/day weekly).  Current nutrition:  NPO  CBGs & Insulin requirements past 24 hours:  160, 153. 6 units sensitive SSI q4h.  IVF: NS at 131ml/hr.  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 Underwent Whipple procedure and placement of pancreatic stent on 10/18. Pharmacy asked to start TNA on  10/19.   Electrolytes: Na low, others wnl. Corr Ca 9.3.  LFTs: elevated as expected.  TGs: 82  Prealbumin:   Plan: At 1800 today:  Start Clinimix E 5/20 at 51ml/hr.  Fat emulsion at 67ml/hr(MWF only due to ongoing shortage).  Plan to advance as tolerated to goal rate stated above. Will request RD reassess.  TNA to contain standard multivitamins and trace elements(MWF only due to ongoing shortage).  Reduce IVF to 77ml/hr.  Cont SSI as ordered.   TNA lab panels on Mondays & Thursdays.  F/u daily.  Cmet in am.  Charolotte Eke, PharmD, pager [phone]. 06/07/2012,10:44 AM.

## 2012-06-07 NOTE — Progress Notes (Signed)
1 Day Post-Op  Subjective: Having some pain.  Hitting PCA.  No n/v.  K came down from OR.    Objective: Vital signs in last 24 hours: Temp:  [97.4 F (36.3 C)-98.3 F (36.8 C)] 97.5 F (36.4 C) (10/19 0400) Pulse Rate:  [81-95] 86  (10/19 0700) Resp:  [12-43] 13  (10/19 0700) BP: (101-156)/(40-76) 104/48 mmHg (10/19 0700) SpO2:  [95 %-100 %] 95 % (10/19 0700) Arterial Line BP: (112-188)/(43-66) 112/43 mmHg (10/19 0700) Weight:  [154 lb 1.6 oz (69.899 kg)] 154 lb 1.6 oz (69.899 kg) (10/19 0600)    Intake/Output from previous day: 10/18 0701 - 10/19 0700 In: 7020.9 [I.V.:6320.9; NG/GT:90; IV Piggyback:610] Out: 3631 [Urine:2665; Emesis/NG output:20; Drains:346; Blood:600] Intake/Output this shift:    General appearance: alert, cooperative and no distress Resp: no respiratory distress GI: soft, mildly distended.  approp tender Extremities: extremities normal, atraumatic, no cyanosis or edema Incision/Wound:  Dressing c/d/i JPs serosanguinous.  Lab Results:   Basename 06/07/12 0400 06/06/12 1800  WBC 9.9 13.4*  HGB 11.5* 11.2*  HCT 32.9* 32.6*  PLT 157 156   BMET  Basename 06/07/12 0400 06/06/12 1800 06/06/12 1630  NA 129* -- 131*  K 4.1 -- 4.7  CL 98 -- 99  CO2 21 -- 20  GLUCOSE 153* -- 160*  BUN 13 -- 24*  CREATININE 0.68 0.79 --  CALCIUM 8.0* -- 7.9*   PT/INR  Basename 06/07/12 0400  LABPROT 14.5  INR 1.15   ABG No results found for this basename: PHART:2,PCO2:2,PO2:2,HCO3:2 in the last 72 hours  Studies/Results: Dg Abd 1 View  06/06/2012  *RADIOLOGY REPORT*  Clinical Data: Missing surgical needle  ABDOMEN - 1 VIEW  Comparison: 06/07/2012  Findings: There is a nasogastric tube with tip in the stomach. There are two surgical drainage catheters overlying the right upper lobe.  Skin staples are noted along the midline of the abdomen and right lower quadrant of the abdomen.  The bowel gas pattern is nonobstructed.  No abnormal radiopaque foreign bodies  identified.  IMPRESSION:  1. No missing surgical instruments or needles identified.   Original Report Authenticated By: Rosealee Albee, M.D.    Dg Chest Port 1 View  06/06/2012  *RADIOLOGY REPORT*  Clinical Data: Line placement  PORTABLE CHEST - 1 VIEW  Comparison: 06/03/2012  Findings: Interval placement of a right IJ venous catheter with its tip in the lower SVC.  Stable right PICC with its tip at the cavoatrial junction.  Enteric tube coursing below the diaphragm.  Suspected mild interstitial edema, new.  Bilateral lower lobe opacities, atelectasis versus pneumonia, unchanged.  No pneumothorax.  The heart is normal in size.  IMPRESSION: Interval placement of a right IJ venous catheter with its tip in the lower SVC.  No pneumothorax.  Bilateral lower lobe opacities, atelectasis versus pneumonia, unchanged.  Suspected mild interstitial edema, new.   Original Report Authenticated By: Charline Bills, M.D.     Anti-infectives: Anti-infectives     Start     Dose/Rate Route Frequency Ordered Stop   06/06/12 1800   ertapenem (INVANZ) 1 g in sodium chloride 0.9 % 50 mL IVPB        1 g 100 mL/hr over 30 Minutes Intravenous Every 24 hours 06/06/12 1740 06/06/12 1906   06/05/12 1433   ertapenem (INVANZ) 1 g in sodium chloride 0.9 % 50 mL IVPB        1 g 100 mL/hr over 30 Minutes Intravenous 60 min pre-op 06/05/12 1433 06/06/12 1030  Assessment/Plan: s/p Procedure(s) (LRB) with comments: WHIPPLE PROCEDURE (N/A) - Pancreatico duodenectomy LYSIS OF ADHESION (N/A) CHOLECYSTECTOMY (N/A) PANCREATIC STENT PLACEMENT (N/A) - Removal of biliary stent; Placement of pacreatic stent LYMPH NODE DISSECTION (N/A) - Portal lymph node dissection Out of bed Incentive spirometry. Hypertension- add a PRN Severe protein calorie malnutrition - TNA .  Expect pt to be NPO for at least another 4 days and even then, appetite will be low.   Hyperglycemia - on SSI.     LOS: 1 day     [facility] 06/07/2012

## 2012-06-08 LAB — TYPE AND SCREEN
Antibody Screen: NEGATIVE
Unit division: 0
Unit division: 0
Unit division: 0

## 2012-06-08 LAB — COMPREHENSIVE METABOLIC PANEL
Albumin: 2.1 g/dL — ABNORMAL LOW (ref 3.5–5.2)
Alkaline Phosphatase: 103 U/L (ref 39–117)
BUN: 9 mg/dL (ref 6–23)
Creatinine, Ser: 0.67 mg/dL (ref 0.50–1.35)
Potassium: 4 mEq/L (ref 3.5–5.1)
Total Protein: 5.4 g/dL — ABNORMAL LOW (ref 6.0–8.3)

## 2012-06-08 LAB — GLUCOSE, CAPILLARY
Glucose-Capillary: 126 mg/dL — ABNORMAL HIGH (ref 70–99)
Glucose-Capillary: 131 mg/dL — ABNORMAL HIGH (ref 70–99)
Glucose-Capillary: 132 mg/dL — ABNORMAL HIGH (ref 70–99)
Glucose-Capillary: 139 mg/dL — ABNORMAL HIGH (ref 70–99)

## 2012-06-08 LAB — CBC
HCT: 28.7 % — ABNORMAL LOW (ref 39.0–52.0)
MCHC: 34.8 g/dL (ref 30.0–36.0)
RDW: 14.8 % (ref 11.5–15.5)

## 2012-06-08 MED ORDER — INSULIN ASPART 100 UNIT/ML ~~LOC~~ SOLN
0.0000 [IU] | SUBCUTANEOUS | Status: DC
  Administered 2012-06-08: 2 [IU] via SUBCUTANEOUS
  Administered 2012-06-08 (×3): 1 [IU] via SUBCUTANEOUS
  Administered 2012-06-09 (×3): 2 [IU] via SUBCUTANEOUS
  Administered 2012-06-09: 09:00:00 via SUBCUTANEOUS
  Administered 2012-06-10 – 2012-06-11 (×6): 2 [IU] via SUBCUTANEOUS
  Administered 2012-06-11: 4 [IU] via SUBCUTANEOUS
  Administered 2012-06-11 – 2012-06-13 (×10): 2 [IU] via SUBCUTANEOUS
  Administered 2012-06-14: 3 [IU] via SUBCUTANEOUS
  Administered 2012-06-14: 2 [IU] via SUBCUTANEOUS
  Administered 2012-06-14: 3 [IU] via SUBCUTANEOUS

## 2012-06-08 MED ORDER — BIOTENE DRY MOUTH MT LIQD
15.0000 mL | Freq: Two times a day (BID) | OROMUCOSAL | Status: DC
  Administered 2012-06-08 – 2012-06-25 (×28): 15 mL via OROMUCOSAL

## 2012-06-08 MED ORDER — CLINIMIX E/DEXTROSE (5/20) 5 % IV SOLN
INTRAVENOUS | Status: AC
  Administered 2012-06-08: 17:00:00 via INTRAVENOUS
  Filled 2012-06-08: qty 2000

## 2012-06-08 MED ORDER — SODIUM CHLORIDE 0.9 % IV SOLN: INTRAVENOUS | Status: AC

## 2012-06-08 NOTE — Progress Notes (Signed)
INITIAL ADULT NUTRITION ASSESSMENT Date: 06/08/2012   Time: 12:01 PM Reason for Assessment: Consult, tpn, nutrition risk  ASSESSMENT: Male 66 y.o.  Dx: Pt with periampullary carcinoma, gastric outlet obstruction s/p whipple, cholecystectomy, pancreatic stent placement, lymph node dissection 10/18   Hx:  Past Medical History  Diagnosis Date  . Hypertension   . High cholesterol   . Coronary artery disease   . Common bile duct (CBD) stricture     Malignant   Past Surgical History  Procedure Date  . Ercp 05/07/2012    Procedure: ENDOSCOPIC RETROGRADE CHOLANGIOPANCREATOGRAPHY (ERCP);  Surgeon: [name], MD;  Location: [facility];  Service: Endoscopy;  Laterality: N/A;  Dr [name]  . Eus 05/07/2012    Procedure: UPPER ENDOSCOPIC ULTRASOUND (EUS) RADIAL;  Surgeon: [name], MD;  Location: [facility];  Service: Endoscopy;;  . Appendectomy     open, 25 yrs ago  . Coronary stent placement     2 stents placed    Related Meds:     . sodium chloride   Intravenous Once  . acetaminophen  1,000 mg Intravenous Q6H  . acetaminophen  1,000 mg Intravenous Q6H  . antiseptic oral rinse  15 mL Mouth Rinse BID  . enoxaparin  40 mg Subcutaneous Q24H  . insulin aspart  0-15 Units Subcutaneous Q4H  . morphine   Intravenous Q4H  . pantoprazole (PROTONIX) IV  40 mg Intravenous QHS  . DISCONTD: insulin aspart  0-9 Units Subcutaneous Q4H     Ht: 5\' 6"  (167.6 cm)  Wt: 153 lb 3.5 oz (69.5 kg)  Ideal Wt: 64.5 kg % Ideal Wt: 108  Usual Wt:  Wt Readings from Last 10 Encounters:  06/08/12 153 lb 3.5 oz (69.5 kg)  06/08/12 153 lb 3.5 oz (69.5 kg)  06/03/12 146 lb 1.6 oz (66.271 kg)  06/02/12 145 lb 3.2 oz (65.862 kg)  05/23/12 156 lb 4.9 oz (70.9 kg)  05/23/12 156 lb 4.9 oz (70.9 kg)  05/23/12 156 lb 4.9 oz (70.9 kg)   % Usual Wt: 105  Body mass index is 24.73 kg/(m^2).   Labs:  CMP     Component Value Date/Time   NA 126* 06/08/2012 0415   K 4.0 06/08/2012 0415   CL  94* 06/08/2012 0415   CO2 24 06/08/2012 0415   GLUCOSE 122* 06/08/2012 0415   BUN 9 06/08/2012 0415   CREATININE 0.67 06/08/2012 0415   CALCIUM 7.9* 06/08/2012 0415   PROT 5.4* 06/08/2012 0415   ALBUMIN 2.1* 06/08/2012 0415   AST 158* 06/08/2012 0415   ALT 281* 06/08/2012 0415   ALKPHOS 103 06/08/2012 0415   BILITOT 1.7* 06/08/2012 0415   GFRNONAA >90 06/08/2012 0415   GFRAA >90 06/08/2012 0415   Results for KIN, GALBRAITH (MRN [id number]) as of 06/08/2012 12:03  Ref. Range 05/09/2012 04:40 05/12/2012 05:00 05/19/2012 05:20 06/07/2012 04:00  Prealbumin Latest Range: 17.0-34.0 mg/dL 14.7 (L) 7.5 (L) 82.9 (L) 20.0   I/O last 3 completed shifts: In: 4631.4 [I.V.:3431.4; NG/GT:180; IV Piggyback:460] Out: 3006 [Urine:2295; Emesis/NG output:220; Drains:491] Total I/O In: 390 [I.V.:240; NG/GT:30; TPN:120] Out: 300 [Urine:260; Drains:40]   Diet Order: NPO  Supplements/Tube Feeding:  none  IVF:    sodium chloride Last Rate: 1,000 mL (06/07/12 0800)  sodium chloride   bupivacaine ON-Q pain pump   TPN (CLINIMIX) +/- additives Last Rate: 40 mL/hr at 06/07/12 1748  TPN (CLINIMIX) +/- additives   DISCONTD: sodium chloride Last Rate: 60 mL/hr at 06/08/12 0828    Estimated  Nutritional Needs:   Kcal: 1800-2100 Protein: 95-105 gm protein Fluid: >1.7L Food/Nutrition Related Hx: RD note from last admit reviewed.  Pt with poor intake and weight loss prior to admit earlier this month with intake of only dinner and snacks earlier in the day.  Pt had lost weight at that time and met the criteria for moderate malnutrition related to acute illness at that time.  Pt was discharged on home tpn.    Per daughter, pt was only able to tolerate clear liquids and a small amount of milk.  Pt was unable to try rice and chicken.  Pt currently npo on bowel rest with NG tube in place.  Passing flatus.  Since discharge weight and prealbumin have increased.  TPN;  Clinimix e 5/20 at 70 ml/hr plus 10% lipids MWF  poviding an average of 1684 kcal, 84 gm protein daily.   NUTRITION DIAGNOSIS: -Inadequate oral intake (NI-2.1).  Status: Ongoing  RELATED TO: altered gi function  AS EVIDENCE BY: npo status  MONITORING/EVALUATION(Goals): Tpn, diet advancement, labs, weith Goal:  TPN + lipids to meet 100% estimated needs until diet can be advanced and tolerated.  EDUCATION NEEDS: -No education needs identified at this time  INTERVENTION: TPN goal: 90 ml Clinimix E 5/20 plus 20% lipids MWF to provide:  2107 kcal, 108 gm protein daily.  Will monitor with pharmacy  Dietitian [phone]  DOCUMENTATION CODES Per approved criteria  -Non-severe (moderate) malnutrition in the context of acute illness or injury    Jobe, Anastasia Fiedler 06/08/2012, 12:01 PM

## 2012-06-08 NOTE — Progress Notes (Signed)
2 Days Post-Op  Subjective: Passing flatus.  Has some pain issues.  Using lots of PCA morphine. Also with some low UOP yesterday which responded well to bolus  Objective: Vital signs in last 24 hours: Temp:  [97.8 F (36.6 C)-99.5 F (37.5 C)] 98.9 F (37.2 C) (10/20 0400) Pulse Rate:  [87-105] 98  (10/20 0700) Resp:  [8-24] 24  (10/20 0700) BP: (93)/(59) 93/59 mmHg (10/19 1130) SpO2:  [88 %-96 %] 88 % (10/20 0700) Arterial Line BP: (97-111)/(42-44) 111/44 mmHg (10/19 1000) FiO2 (%):  [94 %] 94 % (10/19 1212) Weight:  [153 lb 3.5 oz (69.5 kg)-153 lb 10.6 oz (69.7 kg)] 153 lb 3.5 oz (69.5 kg) (10/20 0400)    Intake/Output from previous day: 10/19 0701 - 10/20 0700 In: 2975.5 [I.V.:2175.5; NG/GT:90; IV Piggyback:150; TPN:560] Out: 1530 [Urine:1075; Emesis/NG output:200; Drains:255] Intake/Output this shift:    General appearance: alert, cooperative and no distress Resp: clear to auscultation bilaterally and nonlabored Cardio: normal rate, regular GI: soft, bilat tenderness, greatest in LLQ, JP's okay ss output, ONQ pump with left in bulb, NG with bilious output Extremities: SCD's bilat Neurologic: Grossly normal  Lab Results:   Basename 06/08/12 0415 06/07/12 1454 06/07/12 0400  WBC 13.8* -- 9.9  HGB 10.0* 11.0* --  HCT 28.7* 31.3* --  PLT 156 -- 157   BMET  Basename 06/08/12 0415 06/07/12 0400  NA 126* 129*  K 4.0 4.1  CL 94* 98  CO2 24 21  GLUCOSE 122* 153*  BUN 9 13  CREATININE 0.67 0.68  CALCIUM 7.9* 8.0*   PT/INR  Basename 06/07/12 0400  LABPROT 14.5  INR 1.15   ABG No results found for this basename: PHART:2,PCO2:2,PO2:2,HCO3:2 in the last 72 hours  Studies/Results: Dg Abd 1 View  06/06/2012  *RADIOLOGY REPORT*  Clinical Data: Missing surgical needle  ABDOMEN - 1 VIEW  Comparison: 06/07/2012  Findings: There is a nasogastric tube with tip in the stomach. There are two surgical drainage catheters overlying the right upper lobe.  Skin staples  are noted along the midline of the abdomen and right lower quadrant of the abdomen.  The bowel gas pattern is nonobstructed.  No abnormal radiopaque foreign bodies identified.  IMPRESSION:  1. No missing surgical instruments or needles identified.   Original Report Authenticated By: Rosealee Albee, M.D.    Dg Chest Port 1 View  06/06/2012  *RADIOLOGY REPORT*  Clinical Data: Line placement  PORTABLE CHEST - 1 VIEW  Comparison: 06/03/2012  Findings: Interval placement of a right IJ venous catheter with its tip in the lower SVC.  Stable right PICC with its tip at the cavoatrial junction.  Enteric tube coursing below the diaphragm.  Suspected mild interstitial edema, new.  Bilateral lower lobe opacities, atelectasis versus pneumonia, unchanged.  No pneumothorax.  The heart is normal in size.  IMPRESSION: Interval placement of a right IJ venous catheter with its tip in the lower SVC.  No pneumothorax.  Bilateral lower lobe opacities, atelectasis versus pneumonia, unchanged.  Suspected mild interstitial edema, new.   Original Report Authenticated By: Charline Bills, M.D.     Anti-infectives: Anti-infectives     Start     Dose/Rate Route Frequency Ordered Stop   06/06/12 1800   ertapenem (INVANZ) 1 g in sodium chloride 0.9 % 50 mL IVPB        1 g 100 mL/hr over 30 Minutes Intravenous Every 24 hours 06/06/12 1740 06/06/12 1906   06/05/12 1433   ertapenem (INVANZ) 1  g in sodium chloride 0.9 % 50 mL IVPB        1 g 100 mL/hr over 30 Minutes Intravenous 60 min pre-op 06/05/12 1433 06/06/12 1030          Assessment/Plan: s/p Procedure(s) (LRB) with comments: WHIPPLE PROCEDURE (N/A) - Pancreatico duodenectomy LYSIS OF ADHESION (N/A) CHOLECYSTECTOMY (N/A) PANCREATIC STENT PLACEMENT (N/A) - Removal of biliary stent; Placement of pacreatic stent LYMPH NODE DISSECTION (N/A) - Portal lymph node dissection Had some borderline UOP yesterday, better this am.  If UOP okay this am will remove foley later.  Increase fluid intake, breathing comfortably. Pulmonary toilet.  Consider toradol for pain PRN.  Keep NPO and TPN.  LOS: 2 days    Colin Castro 06/08/2012

## 2012-06-08 NOTE — Progress Notes (Signed)
PARENTERAL NUTRITION CONSULT NOTE  Pharmacy Consult for TNA Indication: bowel rest s/p Whipple procedure  No Known Allergies  Patient Measurements: Height: 5\' 6"  (167.6 cm) Weight: 153 lb 3.5 oz (69.5 kg) IBW/kg (Calculated) : 63.8   Vital Signs: Temp: 98.9 F (37.2 C) (10/20 0400) Temp src: Oral (10/20 0400) Pulse Rate: 98  (10/20 0700) Intake/Output from previous day: 10/19 0701 - 10/20 0700 In: 2975.5 [I.V.:2175.5; NG/GT:90; IV Piggyback:150; TPN:560] Out: 1530 [Urine:1075; Emesis/NG output:200; Drains:255] Intake/Output from this shift:    Labs:  [facility] 06/08/12 0415 06/07/12 1454 06/07/12 0400 06/06/12 1800  WBC 13.8* -- 9.9 13.4*  HGB 10.0* 11.0* 11.5* --  HCT 28.7* 31.3* 32.9* --  PLT 156 -- 157 156  APTT -- -- 37 --  INR -- -- 1.15 --     Basename 06/08/12 0415 06/07/12 0400 06/06/12 1800 06/06/12 1630  NA 126* 129* -- 131*  K 4.0 4.1 -- 4.7  CL 94* 98 -- 99  CO2 24 21 -- 20  GLUCOSE 122* 153* -- 160*  BUN 9 13 -- 24*  CREATININE 0.67 0.68 0.79 --  LABCREA -- -- -- --  CREAT24HRUR -- -- -- --  CALCIUM 7.9* 8.0* -- 7.9*  MG -- 1.7 -- --  PHOS -- 3.2 -- --  PROT 5.4* 5.4* -- --  ALBUMIN 2.1* 2.4* -- --  AST 158* 141* -- --  ALT 281* 190* -- --  ALKPHOS 103 124* -- --  BILITOT 1.7* 2.1* -- --  BILIDIR -- -- -- --  IBILI -- -- -- --  PREALBUMIN -- 20.0 -- --  TRIG -- 82 -- --  CHOLHDL -- -- -- --  CHOL -- 104 -- --   Estimated Creatinine Clearance: 83.1 ml/min (by C-G formula based on Cr of 0.67).    Basename 06/08/12 0401 06/07/12 2337 06/07/12 2011  GLUCAP 132* 139* 139*   Nutritional Goals:  Per pre-op RD recs 10/4: 1750-2100 Kcal/d and 85-105g protein/d. Clinimix E 5/20 at 152ml/hr + IVFE 20% at 5ml/hr MWF to provide: 120g/day protein and 2592Kcal/day MWF, 2112Kcal/day STTHS(Avg. 2318Kcal/day weekly).  Current nutrition:  NPO  CBGs & Insulin requirements past 24 hours:  CBGs at goal <150. 5 units sensitive SSI q4h.  IVF: NS at  85ml/hr.  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 Underwent Whipple procedure and placement of pancreatic stent on 10/18. TNA started 10/19.  Electrolytes: Na low(unable to adjust in premixed TNA), others wnl. Corr Ca 9.4.  LFTs: elevated as expected.  TGs: 82  Prealbumin: 20  Plan:  Increase Clinimix E 5/20 to 56ml/hr.  Fat emulsion at 34ml/hr(MWF only due to ongoing shortage).  Plan to advance as tolerated to goal rate stated above. Will request RD reassess.  TNA to contain standard multivitamins and trace elements(MWF only due to ongoing shortage).  Reduce IVF to 19ml/hr.  Change SSI to moderate scale, cont q4h.   TNA lab panels on Mondays & Thursdays.  F/u daily.  Charolotte Eke, PharmD, pager [phone]. 06/08/2012,7:39 AM.

## 2012-06-09 DIAGNOSIS — E876 Hypokalemia: Secondary | ICD-10-CM

## 2012-06-09 DIAGNOSIS — I10 Essential (primary) hypertension: Secondary | ICD-10-CM

## 2012-06-09 LAB — COMPREHENSIVE METABOLIC PANEL
ALT: 192 U/L — ABNORMAL HIGH (ref 0–53)
Albumin: 1.9 g/dL — ABNORMAL LOW (ref 3.5–5.2)
Alkaline Phosphatase: 102 U/L (ref 39–117)
BUN: 11 mg/dL (ref 6–23)
Chloride: 93 mEq/L — ABNORMAL LOW (ref 96–112)
Potassium: 3.2 mEq/L — ABNORMAL LOW (ref 3.5–5.1)
Sodium: 127 mEq/L — ABNORMAL LOW (ref 135–145)
Total Bilirubin: 1.5 mg/dL — ABNORMAL HIGH (ref 0.3–1.2)

## 2012-06-09 LAB — DIFFERENTIAL
Basophils Relative: 0 % (ref 0–1)
Eosinophils Absolute: 0.2 10*3/uL (ref 0.0–0.7)
Eosinophils Relative: 1 % (ref 0–5)
Lymphs Abs: 1.5 10*3/uL (ref 0.7–4.0)
Monocytes Absolute: 1.5 10*3/uL — ABNORMAL HIGH (ref 0.1–1.0)
Monocytes Relative: 12 % (ref 3–12)
Neutrophils Relative %: 74 % (ref 43–77)

## 2012-06-09 LAB — CBC
HCT: 26.8 % — ABNORMAL LOW (ref 39.0–52.0)
Hemoglobin: 9.4 g/dL — ABNORMAL LOW (ref 13.0–17.0)
MCH: 28.4 pg (ref 26.0–34.0)
MCHC: 35.1 g/dL (ref 30.0–36.0)
MCV: 81 fL (ref 78.0–100.0)

## 2012-06-09 LAB — POCT I-STAT 7, (LYTES, BLD GAS, ICA,H+H)
Acid-base deficit: 5 mmol/L — ABNORMAL HIGH (ref 0.0–2.0)
Calcium, Ion: 1.14 mmol/L (ref 1.13–1.30)
O2 Saturation: 99 %
Patient temperature: 36.5
Potassium: 6 mEq/L — ABNORMAL HIGH (ref 3.5–5.1)
Sodium: 130 mEq/L — ABNORMAL LOW (ref 135–145)
pCO2 arterial: 44.5 mmHg (ref 35.0–45.0)

## 2012-06-09 LAB — POCT I-STAT 4, (NA,K, GLUC, HGB,HCT)
Glucose, Bld: 147 mg/dL — ABNORMAL HIGH (ref 70–99)
HCT: 33 % — ABNORMAL LOW (ref 39.0–52.0)
Hemoglobin: 11.2 g/dL — ABNORMAL LOW (ref 13.0–17.0)

## 2012-06-09 LAB — GLUCOSE, CAPILLARY
Glucose-Capillary: 114 mg/dL — ABNORMAL HIGH (ref 70–99)
Glucose-Capillary: 125 mg/dL — ABNORMAL HIGH (ref 70–99)
Glucose-Capillary: 126 mg/dL — ABNORMAL HIGH (ref 70–99)
Glucose-Capillary: 135 mg/dL — ABNORMAL HIGH (ref 70–99)
Glucose-Capillary: 83 mg/dL (ref 70–99)

## 2012-06-09 LAB — MAGNESIUM: Magnesium: 1.9 mg/dL (ref 1.5–2.5)

## 2012-06-09 MED ORDER — SODIUM CHLORIDE 0.9 % IV SOLN
INTRAVENOUS | Status: DC
  Administered 2012-06-10 – 2012-06-13 (×3): via INTRAVENOUS

## 2012-06-09 MED ORDER — POTASSIUM CHLORIDE 10 MEQ/50ML IV SOLN
10.0000 meq | INTRAVENOUS | Status: AC
  Administered 2012-06-09 (×4): 10 meq via INTRAVENOUS
  Filled 2012-06-09: qty 200

## 2012-06-09 MED ORDER — BUPIVACAINE 0.25 % ON-Q PUMP DUAL CATH 300 ML
300.0000 mL | INJECTION | Status: DC
  Filled 2012-06-09: qty 300

## 2012-06-09 MED ORDER — ZINC TRACE METAL 1 MG/ML IV SOLN
INTRAVENOUS | Status: AC
  Administered 2012-06-09: 19:00:00 via INTRAVENOUS
  Filled 2012-06-09: qty 2000

## 2012-06-09 MED ORDER — FAT EMULSION 20 % IV EMUL
240.0000 mL | INTRAVENOUS | Status: AC
  Administered 2012-06-09: 240 mL via INTRAVENOUS
  Filled 2012-06-09: qty 250

## 2012-06-09 NOTE — Progress Notes (Signed)
PARENTERAL NUTRITION CONSULT NOTE  Pharmacy Consult for TNA Indication: bowel rest s/p Whipple procedure  No Known Allergies  Patient Measurements: Height: 5\' 6"  (167.6 cm) Weight: 153 lb 3.5 oz (69.5 kg) IBW/kg (Calculated) : 63.8   Vital Signs: Temp: 99.4 F (37.4 C) (10/21 0409) Temp src: Oral (10/20 2358) BP: 147/53 mmHg (10/21 0600) Pulse Rate: 92  (10/21 0600) Intake/Output from previous day: 10/20 0701 - 10/21 0700 In: 2410 [I.V.:1050; NG/GT:50; TPN:1310] Out: 2120 [Urine:1760; Emesis/NG output:150; Drains:210] Intake/Output from this shift:    Labs:  [facility] 06/09/12 0445 06/08/12 0415 06/07/12 1454 06/07/12 0400  WBC 12.1* 13.8* -- 9.9  HGB 9.4* 10.0* 11.0* --  HCT 26.8* 28.7* 31.3* --  PLT 156 156 -- 157  APTT -- -- -- 37  INR -- -- -- 1.15     Basename 06/09/12 0445 06/08/12 0415 06/07/12 0400  NA 127* 126* 129*  K 3.2* 4.0 4.1  CL 93* 94* 98  CO2 24 24 21   GLUCOSE 127* 122* 153*  BUN 11 9 13   CREATININE 0.67 0.67 0.68  LABCREA -- -- --  CREAT24HRUR -- -- --  CALCIUM 8.0* 7.9* 8.0*  MG 1.9 -- 1.7  PHOS 2.5 -- 3.2  PROT 5.4* 5.4* 5.4*  ALBUMIN 1.9* 2.1* 2.4*  AST 71* 158* 141*  ALT 192* 281* 190*  ALKPHOS 102 103 124*  BILITOT 1.5* 1.7* 2.1*  BILIDIR -- -- --  IBILI -- -- --  PREALBUMIN -- -- 20.0  TRIG 111 -- 82  CHOLHDL -- -- --  CHOL 114 -- 454   Estimated Creatinine Clearance: 83.1 ml/min (by C-G formula based on Cr of 0.67).    Basename 06/09/12 0333 06/08/12 2353 06/08/12 1941  GLUCAP 131* 123* 126*   Nutritional Goals:  Per RD recs 10/20: 1800-2100 Kcal/d and 95-105g protein/d. Clinimix E 5/20 at 80 ml/hr + IVFE 20% at 72ml/hr MWF to provide: 96 g/day protein and 2169 Kcal/day MWF, 1689 Kcal/day STTHS (Avg. 1869 Kcal/day weekly).  Current nutrition:  NPO Clinimix E 5/20 @ 70 ml/hr  CBGs & Insulin requirements past 24 hours:  CBGs at goal <150; 9 units sensitive SSI q4h used  IVF: NS at 61ml/hr.  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 Underwent Whipple procedure and placement of pancreatic stent on 10/18. TNA started 10/19.  Electrolytes: Na low (unable to adjust in premixed TNA), K+ 3.2 - will order replacement today.  Corr Ca 9.7  LFTs: elevated as expected but trending down.  TGs: WNL  Prealbumin: 20 (10/19)  Plan:  Increase Clinimix E 5/20 to 27ml/hr (goal rate).  Fat emulsion at 68ml/hr (MWF only due to ongoing shortage).  TNA to contain standard multivitamins and trace elements (MWF only due to ongoing shortage).  KCl 10 mEq/50 mL IV x 4 runs (to be run through central line).  Reduce IVF to [facility] following increase in TNA rate.  Continue moderate scale SSI q4h.   TNA lab panels on Mondays & Thursdays.  CMET in AM.  F/u daily.   Clance Boll, PharmD, BCPS Pager: [phone] 06/09/2012 7:27 AM

## 2012-06-09 NOTE — Progress Notes (Addendum)
Updates to pt and daughter at bedside. VSS, Dr Donell Beers rounded this am. K+ 3.2, 40 Meq KCL given and increased IVF rate. Pt urine amber- dehydrated? NGT to LIS minimal greenish black drainage. Reminded to use PCA when needed before pain peaks and demonstrated on incentive spirometer. Sat up for 2.5 hours, encourage to increase time as tolerated. Report called to [place], Charity fundraiser. Plan to transfer pt to [id number].

## 2012-06-09 NOTE — Progress Notes (Signed)
3 Days Post-Op  Subjective: Doing OK other than pain.  Had 28 mg morphine yesterday.    Objective: Vital signs in last 24 hours: Temp:  [97.5 F (36.4 C)-99.4 F (37.4 C)] 97.5 F (36.4 C) (10/21 0800) Pulse Rate:  [91-101] 92  (10/21 0600) Resp:  [2-25] 25  (10/21 0600) BP: (96-147)/(41-115) 147/53 mmHg (10/21 0600) SpO2:  [91 %-96 %] 94 % (10/21 0600) FiO2 (%):  [93 %-94 %] 93 % (10/20 2000)    Intake/Output from previous day: 10/20 0701 - 10/21 0700 In: 2410 [I.V.:1050; NG/GT:50; TPN:1310] Out: 2120 [Urine:1760; Emesis/NG output:150; Drains:210] Intake/Output this shift:    General appearance: alert, cooperative and mild distress Head: Normocephalic, without obvious abnormality, atraumatic Resp: no respiratory distress GI: soft, approp tender.  Dressings c/d/i.  Jps serous-serosang Extremities: extremities normal, atraumatic, no cyanosis or edema  Lab Results:   Basename 06/09/12 0445 06/08/12 0415  WBC 12.1* 13.8*  HGB 9.4* 10.0*  HCT 26.8* 28.7*  PLT 156 156   BMET  Basename 06/09/12 0445 06/08/12 0415  NA 127* 126*  K 3.2* 4.0  CL 93* 94*  CO2 24 24  GLUCOSE 127* 122*  BUN 11 9  CREATININE 0.67 0.67  CALCIUM 8.0* 7.9*   PT/INR  Basename 06/07/12 0400  LABPROT 14.5  INR 1.15   ABG No results found for this basename: PHART:2,PCO2:2,PO2:2,HCO3:2 in the last 72 hours  Studies/Results: No results found.  Anti-infectives: Anti-infectives     Start     Dose/Rate Route Frequency Ordered Stop   06/06/12 1800   ertapenem (INVANZ) 1 g in sodium chloride 0.9 % 50 mL IVPB        1 g 100 mL/hr over 30 Minutes Intravenous Every 24 hours 06/06/12 1740 06/06/12 1906   06/05/12 1433   ertapenem (INVANZ) 1 g in sodium chloride 0.9 % 50 mL IVPB        1 g 100 mL/hr over 30 Minutes Intravenous 60 min pre-op 06/05/12 1433 06/06/12 1030          Assessment/Plan: s/p Procedure(s) (LRB) with comments: WHIPPLE PROCEDURE (N/A) - Pancreatico  duodenectomy LYSIS OF ADHESION (N/A) CHOLECYSTECTOMY (N/A) PANCREATIC STENT PLACEMENT (N/A) - Removal of biliary stent; Placement of pacreatic stent LYMPH NODE DISSECTION (N/A) - Portal lymph node dissection transfer to floor. Hyponatremia, hypochloremia - increase NS. Hypokalemia - replete with IV KCl HTN - on prn hydralazine. Hyperglycemia - SSI. Pulmonary toilet. Await pathology for malignant stricture of bile duct/duodenum. Refill pain pump when empty.    LOS: 3 days    [facility] 06/09/2012

## 2012-06-09 NOTE — Care Management Note (Signed)
    Page 1 of 2   06/25/2012     11:15:04 AM   CARE MANAGEMENT NOTE 06/25/2012  Patient:  Colin Castro, Colin Castro   Account Number:  [id number]  Date Initiated:  06/09/2012  Documentation initiated by:  Lorenda Ishihara  Subjective/Objective Assessment:   66 yo male admitted s/p Whipple procedure. PTA lived at home with family     Action/Plan:   Anticipated DC Date:  06/23/2012   Anticipated DC Plan:  HOME W HOME HEALTH SERVICES      DC Planning Services  CM consult      [facility] Choice  HOME HEALTH  DURABLE MEDICAL EQUIPMENT   Choice offered to / List presented to:  C-3 Spouse        HH arranged  HH-1 RN      [facility] agency  [facility]   Status of service:  Completed, signed off Medicare Important Message given?  NA - LOS <3 / Initial given by admissions (If response is "NO", the following Medicare IM given date fields will be blank) Date Medicare IM given:   Date Additional Medicare IM given:    Discharge Disposition:  HOME W HOME HEALTH SERVICES  Per UR Regulation:  Reviewed for med. necessity/level of care/duration of stay  If discussed at Long Length of Stay Meetings, dates discussed:   06/12/2012    Comments:  [id number] Earlene Plater, RN, BSN, CCM: CHART REVIEWED AND UPDATED. NO DISCHARGE NEEDS PRESENT AT THIS TIME. CASE MANAGEMENT [phone]

## 2012-06-10 ENCOUNTER — Inpatient Hospital Stay (HOSPITAL_COMMUNITY): Payer: Medicaid Other

## 2012-06-10 LAB — URINALYSIS, ROUTINE W REFLEX MICROSCOPIC
Glucose, UA: NEGATIVE mg/dL
Leukocytes, UA: NEGATIVE
Protein, ur: NEGATIVE mg/dL
pH: 7 (ref 5.0–8.0)

## 2012-06-10 LAB — COMPREHENSIVE METABOLIC PANEL
Albumin: 2 g/dL — ABNORMAL LOW (ref 3.5–5.2)
BUN: 11 mg/dL (ref 6–23)
Calcium: 8.2 mg/dL — ABNORMAL LOW (ref 8.4–10.5)
Creatinine, Ser: 0.78 mg/dL (ref 0.50–1.35)
GFR calc Af Amer: >90 mL/min (ref >90)
Total Protein: 5.8 g/dL — ABNORMAL LOW (ref 6.0–8.3)

## 2012-06-10 LAB — GLUCOSE, CAPILLARY: Glucose-Capillary: 136 mg/dL — ABNORMAL HIGH (ref 70–99)

## 2012-06-10 MED ORDER — DIPHENHYDRAMINE HCL 12.5 MG/5ML PO ELIX: 12.5000 mg | ORAL_SOLUTION | Freq: Four times a day (QID) | ORAL | Status: DC | PRN

## 2012-06-10 MED ORDER — CLINIMIX E/DEXTROSE (5/20) 5 % IV SOLN
INTRAVENOUS | Status: AC
  Administered 2012-06-10: 17:00:00 via INTRAVENOUS
  Filled 2012-06-10: qty 2000

## 2012-06-10 MED ORDER — METOPROLOL TARTRATE 1 MG/ML IV SOLN
5.0000 mg | Freq: Four times a day (QID) | INTRAVENOUS | Status: DC
  Administered 2012-06-10 – 2012-06-22 (×34): 5 mg via INTRAVENOUS
  Filled 2012-06-10 (×62): qty 5

## 2012-06-10 MED ORDER — PIPERACILLIN-TAZOBACTAM 3.375 G IVPB
3.3750 g | Freq: Three times a day (TID) | INTRAVENOUS | Status: DC
  Administered 2012-06-10 – 2012-06-23 (×38): 3.375 g via INTRAVENOUS
  Filled 2012-06-10 (×40): qty 50

## 2012-06-10 MED ORDER — DIPHENHYDRAMINE HCL 50 MG/ML IJ SOLN: 12.5000 mg | Freq: Four times a day (QID) | INTRAMUSCULAR | Status: DC | PRN

## 2012-06-10 MED ORDER — DIPHENHYDRAMINE HCL 50 MG/ML IJ SOLN
12.5000 mg | Freq: Four times a day (QID) | INTRAMUSCULAR | Status: DC | PRN
  Administered 2012-06-10 – 2012-06-14 (×3): 12.5 mg via INTRAVENOUS
  Filled 2012-06-10 (×3): qty 1

## 2012-06-10 MED ORDER — POTASSIUM CHLORIDE 10 MEQ/100ML IV SOLN
10.0000 meq | INTRAVENOUS | Status: AC
  Administered 2012-06-10 (×2): 10 meq via INTRAVENOUS
  Filled 2012-06-10 (×2): qty 100

## 2012-06-10 NOTE — Progress Notes (Signed)
PARENTERAL NUTRITION CONSULT NOTE  Pharmacy Consult for TNA Indication: bowel rest s/p Whipple procedure  No Known Allergies  Patient Measurements: Height: 5\' 6"  (167.6 cm) Weight: 153 lb 3.5 oz (69.5 kg) IBW/kg (Calculated) : 63.8   Vital Signs: Temp: 99.4 F (37.4 C) (10/22 0604) Temp src: Oral (10/22 0604) BP: 149/78 mmHg (10/22 0604) Pulse Rate: 87  (10/22 0604) Intake/Output from previous day: 10/21 0701 - 10/22 0700 In: 800 [I.V.:240; TPN:560] Out: 3218 [Urine:2955; Emesis/NG output:75; Drains:188] Intake/Output from this shift: Total I/O In: -  Out: 500 [Urine:500]  Labs:  [facility] 06/09/12 0445 06/08/12 0415 06/07/12 1454  WBC 12.1* 13.8* --  HGB 9.4* 10.0* 11.0*  HCT 26.8* 28.7* 31.3*  PLT 156 156 --  APTT -- -- --  INR -- -- --     Basename 06/10/12 0620 06/09/12 0445 06/08/12 0415  NA 130* 127* 126*  K 3.5 3.2* 4.0  CL 96 93* 94*  CO2 24 24 24   GLUCOSE 131* 127* 122*  BUN 11 11 9   CREATININE 0.78 0.67 0.67  LABCREA -- -- --  CREAT24HRUR -- -- --  CALCIUM 8.2* 8.0* 7.9*  MG -- 1.9 --  PHOS -- 2.5 --  PROT 5.8* 5.4* 5.4*  ALBUMIN 2.0* 1.9* 2.1*  AST 46* 71* 158*  ALT 136* 192* 281*  ALKPHOS 115 102 103  BILITOT 1.4* 1.5* 1.7*  BILIDIR -- -- --  IBILI -- -- --  PREALBUMIN -- 10.2* --  TRIG -- 111 --  CHOLHDL -- -- --  CHOL -- 114 --  Corrected Ca = 9.8 Estimated Creatinine Clearance: 83.1 ml/min (by C-G formula based on Cr of 0.78).    Basename 06/10/12 0809 06/10/12 0426 06/09/12 2336  GLUCAP 141* 124* 125*   Nutritional Goals:  Per RD recs 10/20: 1800-2100 Kcal/d and 95-105g protein/d. Clinimix E 5/20 at 80 ml/hr + IVFE 20% at 39ml/hr MWF to provide: 96 g/day protein and 2169 Kcal/day MWF, 1689 Kcal/day STTHS (Avg. 1896 Kcal/day weekly).  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 Underwent Whipple procedure and placement of pancreatic stent on 10/18. TNA started 10/19.  Electrolytes: Na low but improving (unable to adjust in premixed TNA), K improved, at lower limit of normal range.  LFTs: elevated as expected but trending down.  TGs: WNL  Prealbumin: 20 (10/19) ---> 10.2 (10/21) - falling, but drawn prior to achieving goal rate on postop TNA  Plan:  Increase Clinimix-E 5/20 to 73ml/hr (goal rate).  Fat emulsion 20% at 74ml/hr (MWF only due to ongoing shortage).  TNA to contain standard multivitamins and trace elements (MWF only due to ongoing shortage).  KCl 10 mEq IV q1h x 2 runs today.  Recheck K tomorrow AM.  Continue moderate scale Novolog SSI q4h.   TNA lab panels on Mondays & Thursdays.    F/U daily.   Hope Budds, PharmD, BCPS Pager: [phone] 06/10/2012 9:18 AM

## 2012-06-10 NOTE — Progress Notes (Signed)
Patient ID: Colin Castro, male   DOB: February 10, 1946, 66 y.o.   MRN: [id number] 4 Days Post-Op  Subjective: Doing OK other than pain.  Taking morphine better today.  Feeling bloated.  No flatus yet.  Objective: Vital signs in last 24 hours: Temp:  [98.4 F (36.9 C)-100.8 F (38.2 C)] 99.4 F (37.4 C) (10/22 0604) Pulse Rate:  [86-93] 87  (10/22 0604) Resp:  [16-24] 20  (10/22 0604) BP: (112-168)/(60-88) 149/78 mmHg (10/22 0604) SpO2:  [94 %-98 %] 98 % (10/22 0604) FiO2 (%):  [92 %-97 %] 96 % (10/21 1618)    Intake/Output from previous day: 10/21 0701 - 10/22 0700 In: 800 [I.V.:240; TPN:560] Out: 3218 [Urine:2955; Emesis/NG output:75; Drains:188] Intake/Output this shift: Total I/O In: -  Out: 500 [Urine:500]  General appearance: alert, cooperative and mild distress Head: Normocephalic, without obvious abnormality, atraumatic Resp: no respiratory distress GI: soft, approp tender.  Dressings c/d/i.  Jps serous-serosang Incision without erythema or drainage.  OnQ refilled.   Extremities: extremities normal, atraumatic, no cyanosis or edema  Lab Results:   Basename 06/09/12 0445 06/08/12 0415  WBC 12.1* 13.8*  HGB 9.4* 10.0*  HCT 26.8* 28.7*  PLT 156 156   BMET  Basename 06/10/12 0620 06/09/12 0445  NA 130* 127*  K 3.5 3.2*  CL 96 93*  CO2 24 24  GLUCOSE 131* 127*  BUN 11 11  CREATININE 0.78 0.67  CALCIUM 8.2* 8.0*   PT/INR No results found for this basename: LABPROT:2,INR:2 in the last 72 hours ABG No results found for this basename: PHART:2,PCO2:2,PO2:2,HCO3:2 in the last 72 hours  Studies/Results: No results found.  Anti-infectives: Anti-infectives     Start     Dose/Rate Route Frequency Ordered Stop   06/06/12 1800   ertapenem (INVANZ) 1 g in sodium chloride 0.9 % 50 mL IVPB        1 g 100 mL/hr over 30 Minutes Intravenous Every 24 hours 06/06/12 1740 06/06/12 1906   06/05/12 1433   ertapenem (INVANZ) 1 g in sodium chloride 0.9 % 50 mL IVPB        1  g 100 mL/hr over 30 Minutes Intravenous 60 min pre-op 06/05/12 1433 06/06/12 1030          Assessment/Plan: s/p Procedure(s) (LRB) with comments: WHIPPLE PROCEDURE (N/A) - Pancreatico duodenectomy LYSIS OF ADHESION (N/A) CHOLECYSTECTOMY (N/A) PANCREATIC STENT PLACEMENT (N/A) - Removal of biliary stent; Placement of pacreatic stent LYMPH NODE DISSECTION (N/A) - Portal lymph node dissection  Hyponatremia, improved with NS.  Hypochloremia resolved.   Hypokalemia - replete with IV KCl HTN - on prn hydralazine.  Add standing metoprolol Hyperglycemia - SSI. Pulmonary toilet. Await pathology for malignant stricture of bile duct/duodenum. ID - check CXR and UA.  Low grade temps.  Add empiric antibiotics.      LOS: 4 days    [facility] 06/10/2012

## 2012-06-10 NOTE — Progress Notes (Signed)
ANTIBIOTIC CONSULT NOTE - INITIAL  Pharmacy Consult for Zosyn Indication: Empiric (low-grade fevers; U/A and CXR pending)  No Known Allergies  Patient Measurements: Height: 5\' 6"  (167.6 cm) Weight: 153 lb 3.5 oz (69.5 kg) IBW/kg (Calculated) : 63.8    Vital Signs: Temp: 99.4 F (37.4 C) (10/22 0604) Temp src: Oral (10/22 0604) BP: 149/78 mmHg (10/22 0604) Pulse Rate: 87  (10/22 0604) Intake/Output from previous day: 10/21 0701 - 10/22 0700 In: 800 [I.V.:240; TPN:560] Out: 3218 [Urine:2955; Emesis/NG output:75; Drains:188] Intake/Output from this shift: Total I/O In: -  Out: 500 [Urine:500]  Labs:  [facility] 06/10/12 0620 06/09/12 0445 06/08/12 0415 06/07/12 1454  WBC -- 12.1* 13.8* --  HGB -- 9.4* 10.0* 11.0*  PLT -- 156 156 --  LABCREA -- -- -- --  CREATININE 0.78 0.67 0.67 --   Estimated Creatinine Clearance: 83.1 ml/min (by C-G formula based on Cr of 0.78). No results found for this basename: VANCOTROUGH:2,VANCOPEAK:2,VANCORANDOM:2,GENTTROUGH:2,GENTPEAK:2,GENTRANDOM:2,TOBRATROUGH:2,TOBRAPEAK:2,TOBRARND:2,AMIKACINPEAK:2,AMIKACINTROU:2,AMIKACIN:2, in the last 72 hours   Microbiology: Recent Results (from the past 720 hour(s))  SURGICAL PCR SCREEN     Status: Normal   Collection Time   06/03/12  2:11 PM      Component Value Range Status Comment   MRSA, PCR NEGATIVE  NEGATIVE Final    Staphylococcus aureus NEGATIVE  NEGATIVE Final     Medical History: Past Medical History  Diagnosis Date  . Hypertension   . High cholesterol   . Coronary artery disease   . Common bile duct (CBD) stricture     Malignant    Medications:  Scheduled:    . antiseptic oral rinse  15 mL Mouth Rinse BID  . enoxaparin  40 mg Subcutaneous Q24H  . insulin aspart  0-15 Units Subcutaneous Q4H  . metoprolol  5 mg Intravenous Q6H  . morphine   Intravenous Q4H  . pantoprazole (PROTONIX) IV  40 mg Intravenous QHS  . potassium chloride  10 mEq Intravenous Q1 Hr x 4   Infusions:     . sodium chloride 30 mL/hr at 06/08/12 1800  . sodium chloride    . bupivacaine 0.25 % ON-Q pump DUAL CATH 300 mL    . bupivacaine ON-Q pain pump    . fat emulsion 240 mL (06/09/12 1851)  . TPN (CLINIMIX) +/- additives 70 mL/hr at 06/08/12 1727  . 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  Urinalysis and CXR pending.  Goal of Therapy:   Adjust Zosyn for renal function  Plan:  1. Begin Zosyn 3.375 grams IV q8h (extended-infusion) 2. Await results from CXR, U/A. 3. Follow clinical course.  Hope Budds, PharmD, BCPS Pager [phone] 06/10/2012,8:42 AM

## 2012-06-11 LAB — CBC
MCH: 27.9 pg (ref 26.0–34.0)
Platelets: 214 10*3/uL (ref 150–400)
RBC: 3.58 MIL/uL — ABNORMAL LOW (ref 4.22–5.81)

## 2012-06-11 LAB — COMPREHENSIVE METABOLIC PANEL
ALT: 102 U/L — ABNORMAL HIGH (ref 0–53)
AST: 34 U/L (ref 0–37)
Albumin: 1.9 g/dL — ABNORMAL LOW (ref 3.5–5.2)
CO2: 22 mEq/L (ref 19–32)
Calcium: 8.1 mg/dL — ABNORMAL LOW (ref 8.4–10.5)
GFR calc non Af Amer: >90 mL/min (ref >90)
Sodium: 124 mEq/L — ABNORMAL LOW (ref 135–145)
Total Protein: 5.8 g/dL — ABNORMAL LOW (ref 6.0–8.3)

## 2012-06-11 LAB — GLUCOSE, CAPILLARY
Glucose-Capillary: 128 mg/dL — ABNORMAL HIGH (ref 70–99)
Glucose-Capillary: 132 mg/dL — ABNORMAL HIGH (ref 70–99)
Glucose-Capillary: 122 mg/dL — ABNORMAL HIGH (ref 70–99)

## 2012-06-11 MED ORDER — M.V.I. ADULT IV INJ
INJECTION | INTRAVENOUS | Status: AC
  Administered 2012-06-11: 18:00:00 via INTRAVENOUS
  Filled 2012-06-11: qty 2000

## 2012-06-11 MED ORDER — FAT EMULSION 20 % IV EMUL
240.0000 mL | INTRAVENOUS | Status: AC
  Administered 2012-06-11: 240 mL via INTRAVENOUS
  Filled 2012-06-11: qty 250

## 2012-06-11 MED ORDER — VANCOMYCIN HCL IN DEXTROSE 1-5 GM/200ML-% IV SOLN
1000.0000 mg | Freq: Two times a day (BID) | INTRAVENOUS | Status: DC
  Administered 2012-06-11 – 2012-06-13 (×5): 1000 mg via INTRAVENOUS
  Filled 2012-06-11 (×8): qty 200

## 2012-06-11 NOTE — Evaluation (Signed)
Physical Therapy Evaluation Patient Details Name: Colin Castro MRN: [id number] DOB: 1946-06-25 Today's Date: 06/11/2012 Time: 3086-5784 PT Time Calculation (min): 24 min  PT Assessment / Plan / Recommendation Clinical Impression  66 yo male s/p whipple surgery 06/06/12. Demonstrates general weakness, impaired balance, and decreased activity tolerance. Speaks little [place]. Daughter present-assisted with translation. Recommend RW, HHPT and 24 hour supervision at discharge    PT Assessment  Patient needs continued PT services    Follow Up Recommendations  Home health PT;Supervision/Assistance - 24 hour    Does the patient have the potential to tolerate intense rehabilitation      Barriers to Discharge        Equipment Recommendations  Rolling walker with 5" wheels    Recommendations for Other Services OT consult   Frequency Min 3X/week    Precautions / Restrictions Precautions Precautions: Fall Precaution Comments: abdominal surgery, drains (2) Restrictions Weight Bearing Restrictions: No   Pertinent Vitals/Pain 5-6/10 abdomen      Mobility  Bed Mobility Bed Mobility: Supine to Sit;Sit to Supine Supine to Sit: 4: Min assist;HOB elevated Sit to Supine: 4: Min assist;HOB elevated Details for Bed Mobility Assistance: Assist for trunk to upright and bil LEs off/onto bed. Increased time. VCS safety, technique.  Transfers Transfers: Sit to Stand;Stand to Sit Sit to Stand: 4: Min assist;From bed Stand to Sit: 4: Min assist;To bed Details for Transfer Assistance: VCs safety, technique, hand placement. Assist to rise, stabilize, control descent.  Ambulation/Gait Ambulation/Gait Assistance: 4: Min assist Ambulation Distance (Feet): 100 Feet Assistive device: Rolling walker Ambulation/Gait Assistance Details: Assist to stabilize throughout ambulation and maneuver with RW. Fatigues fairly easily. Dyspnea 3/4 with ambulation. VCs safety, technique, distance from RW, pursed  lip/deep breathing Gait Pattern: Step-through pattern;Decreased stride length    Shoulder Instructions     Exercises     PT Diagnosis: Difficulty walking;Generalized weakness;Acute pain  PT Problem List: Decreased strength;Decreased activity tolerance;Decreased balance;Decreased mobility;Pain;Decreased knowledge of use of DME PT Treatment Interventions: DME instruction;Gait training;Functional mobility training;Therapeutic activities;Therapeutic exercise;Patient/family education   PT Goals Acute Rehab PT Goals PT Goal Formulation: With patient/family Time For Goal Achievement: 06/25/12 Potential to Achieve Goals: Good Pt will go Supine/Side to Sit: with supervision PT Goal: Supine/Side to Sit - Progress: Goal set today Pt will go Sit to Supine/Side: with supervision PT Goal: Sit to Supine/Side - Progress: Goal set today Pt will go Sit to Stand: with supervision PT Goal: Sit to Stand - Progress: Goal set today Pt will Ambulate: >150 feet;with supervision;with least restrictive assistive device PT Goal: Ambulate - Progress: Goal set today  Visit Information  Last PT Received On: 06/11/12 Assistance Needed: +1    Subjective Data  Subjective: "Can I just start with my wife?" Patient Stated Goal: Home   Prior Functioning  Home Living Lives With: Spouse Available Help at Discharge: Family Type of Home: Apartment Home Access: Level entry Home Layout: One level Home Adaptive Equipment: None Prior Function Level of Independence: Independent Communication Communication: Prefers language other than English (daughter present and assisted with translation)    Cognition  Overall Cognitive Status: Appears within functional limits for tasks assessed/performed Arousal/Alertness: Awake/Castro Behavior During Session: [facility] for tasks performed    Extremity/Trunk Assessment Right Lower Extremity Assessment RLE ROM/Strength/Tone: Deficits RLE ROM/Strength/Tone Deficits: Strength at least  3/5 wit functional activity Left Lower Extremity Assessment LLE ROM/Strength/Tone: Deficits LLE ROM/Strength/Tone Deficits: Strength at least 3/5 wit functional activity   Balance    End of Session PT - End  of Session Activity Tolerance: Patient limited by fatigue Patient left: in bed;with call bell/phone within reach;with family/visitor present  GP     Colin Castro [facility] 06/11/2012, 4:30 PM [phone]

## 2012-06-11 NOTE — Progress Notes (Signed)
PARENTERAL NUTRITION CONSULT NOTE  Pharmacy Consult for TNA Indication: bowel rest s/p Whipple procedure  No Known Allergies  Patient Measurements: Height: 5\' 6"  (167.6 cm) Weight: 153 lb 3.5 oz (69.5 kg) IBW/kg (Calculated) : 63.8   Vital Signs: Temp: 99.4 F (37.4 C) (10/23 1001) Temp src: Oral (10/23 1001) BP: 121/68 mmHg (10/23 1001) Pulse Rate: 87  (10/23 1001) Intake/Output from previous day: 10/22 0701 - 10/23 0700 In: 880 [I.V.:240; NG/GT:640] Out: 3080 [Urine:2950; Drains:130] Intake/Output from this shift: Total I/O In: 1550 [NG/GT:1550] Out: -   Labs:  [facility] 06/11/12 0545 06/09/12 0445  WBC 13.4* 12.1*  HGB 10.0* 9.4*  HCT 28.7* 26.8*  PLT 214 156  APTT -- --  INR -- --     Basename 06/11/12 0545 06/10/12 0620 06/09/12 0445  NA 124* 130* 127*  K 3.7 3.5 3.2*  CL 89* 96 93*  CO2 22 24 24   GLUCOSE 112* 131* 127*  BUN 14 11 11   CREATININE 0.80 0.78 0.67  LABCREA -- -- --  CREAT24HRUR -- -- --  CALCIUM 8.1* 8.2* 8.0*  MG -- -- 1.9  PHOS -- -- 2.5  PROT 5.8* 5.8* 5.4*  ALBUMIN 1.9* 2.0* 1.9*  AST 34 46* 71*  ALT 102* 136* 192*  ALKPHOS 116 115 102  BILITOT 1.9* 1.4* 1.5*  BILIDIR -- -- --  IBILI -- -- --  PREALBUMIN -- -- 10.2*  TRIG -- -- 111  CHOLHDL -- -- --  CHOL -- -- 114  Corrected Ca = 9.8 Estimated Creatinine Clearance: 83.1 ml/min (by C-G formula based on Cr of 0.8).    Basename 06/11/12 0721 06/11/12 0404 06/10/12 2358  GLUCAP 128* 125* 121*   Nutritional Goals:  Per RD recs 10/20: 1800-2100 Kcal/d and 95-105g protein/d. Clinimix E 5/20 at 80 ml/hr + IVFE 20% at 11ml/hr MWF to provide: 96 g/day protein and 2169 Kcal/day MWF, 1689 Kcal/day STTHS (Avg. 1896 Kcal/day weekly).  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 Underwent Whipple procedure and placement of pancreatic stent on 10/18. TNA started 10/19.  Electrolytes: Na low and dropping (unable to adjust in premixed TNA) - Md aware and wants to keep NS at current rate, K WNL  LFTs: trending downward, with Tbili rising some now  TGs: WNL  Prealbumin: 20 (10/19) ---> 10.2 (10/21) - falling, but drawn prior to achieving goal rate on postop TNA  Plan:  Continue Clinimix-E 5/20 at 35ml/hr (goal rate).  Fat emulsion 20% at 57ml/hr (MWF only due to ongoing shortage).  TNA to contain standard multivitamins and trace elements (MWF only due to ongoing shortage).  Continue moderate scale Novolog SSI q4h.   TNA lab panels on Mondays & Thursdays.    F/U daily.    Hessie Knows, PharmD, BCPS Pager [phone] 06/11/2012 10:41 AM

## 2012-06-11 NOTE — Progress Notes (Addendum)
ANTIBIOTIC CONSULT NOTE - INITIAL  Pharmacy Consult for Vancomycin, Zosyn  Indication: pneumonia  No Known Allergies  Patient Measurements: Height: 5\' 6"  (167.6 cm) Weight: 153 lb 3.5 oz (69.5 kg) IBW/kg (Calculated) : 63.8   Vital Signs: Temp: 99.2 F (37.3 C) (10/23 0615) Temp src: Oral (10/23 0615) BP: 119/64 mmHg (10/23 0615) Pulse Rate: 86  (10/23 0615) Intake/Output from previous day: 10/22 0701 - 10/23 0700 In: 880 [I.V.:240; NG/GT:640] Out: 3080 [Urine:2950; Drains:130] Intake/Output from this shift: Total I/O In: 750 [NG/GT:750] Out: -   Labs:  Basename 06/11/12 0545 06/10/12 0620 06/09/12 0445  WBC 13.4* -- 12.1*  HGB 10.0* -- 9.4*  PLT 214 -- 156  LABCREA -- -- --  CREATININE 0.80 0.78 0.67   Estimated Creatinine Clearance: 83.1 ml/min (by C-G formula based on Cr of 0.8). No results found for this basename: VANCOTROUGH:2,VANCOPEAK:2,VANCORANDOM:2,GENTTROUGH:2,GENTPEAK:2,GENTRANDOM:2,TOBRATROUGH:2,TOBRAPEAK:2,TOBRARND:2,AMIKACINPEAK:2,AMIKACINTROU:2,AMIKACIN:2, in the last 72 hours   Microbiology: Recent Results (from the past 720 hour(s))  SURGICAL PCR SCREEN     Status: Normal   Collection Time   06/03/12  2:11 PM      Component Value Range Status Comment   MRSA, PCR NEGATIVE  NEGATIVE Final    Staphylococcus aureus NEGATIVE  NEGATIVE Final     Medical History: Past Medical History  Diagnosis Date  . Hypertension   . High cholesterol   . Coronary artery disease   . Common bile duct (CBD) stricture     Malignant    Medications:  Scheduled:    . antiseptic oral rinse  15 mL Mouth Rinse BID  . enoxaparin  40 mg Subcutaneous Q24H  . insulin aspart  0-15 Units Subcutaneous Q4H  . metoprolol  5 mg Intravenous Q6H  . morphine   Intravenous Q4H  . pantoprazole (PROTONIX) IV  40 mg Intravenous QHS  . piperacillin-tazobactam (ZOSYN)  IV  3.375 g Intravenous Q8H  . potassium chloride  10 mEq Intravenous Q1 Hr x 2   Infusions:    . sodium  chloride 20 mL/hr at 06/10/12 1047  . bupivacaine 0.25 % ON-Q pump DUAL CATH 300 mL    . fat emulsion 240 mL (06/09/12 1851)  . TPN (CLINIMIX) +/- additives 80 mL/hr at 06/09/12 1851  . TPN (CLINIMIX) +/- additives 80 mL/hr at 06/10/12 1709   Assessment: 66 yo s/p surgery now with presumed HAP to add vancomycin per pharmacy dosing to current Zosyn  Goal of Therapy:  Vancomycin trough level 15-20 mcg/ml  Plan:  1. Based on current renal function and patient weight, start Vancomycin 1000mg  IV q12 (dosed a little more conservatively due to age of 66) 2. Check trough at steady 3. Follow renal function 4. Continue current Zosyn dosing  Hessie Knows, PharmD, BCPS Pager [phone] 06/11/2012 9:23 AM

## 2012-06-11 NOTE — Progress Notes (Signed)
Patient ID: Colin Castro, male   DOB: 22-Jun-1946, 66 y.o.   MRN: [id number] 5 Days Post-Op  Subjective: Feeling much better today.  + flatus 5-6 times.  CXR + possible infiltrate.  U/A negative.  Central line d/c'd.    Objective: Vital signs in last 24 hours: Temp:  [98.4 F (36.9 C)-100.9 F (38.3 C)] 99.2 F (37.3 C) (10/23 0615) Pulse Rate:  [82-90] 86  (10/23 0615) Resp:  [18-22] 20  (10/23 0811) BP: (119-149)/(56-76) 119/64 mmHg (10/23 0615) SpO2:  [91 %-98 %] 91 % (10/23 0811)    Intake/Output from previous day: 10/22 0701 - 10/23 0700 In: 880 [I.V.:240; NG/GT:640] Out: 3080 [Urine:2950; Drains:130] Intake/Output this shift: Total I/O In: 750 [NG/GT:750] Out: -   General appearance: alert, cooperative and mild distress Head: Normocephalic, without obvious abnormality, atraumatic Resp: no respiratory distress GI: soft, approp tender.  Dressings c/d/i.  Jps serous-serosang Incision without erythema or drainage.  OnQ still with adequate local in bulb.   Extremities: extremities normal, atraumatic, no cyanosis or edema  Lab Results:   Basename 06/11/12 0545 06/09/12 0445  WBC 13.4* 12.1*  HGB 10.0* 9.4*  HCT 28.7* 26.8*  PLT 214 156   BMET  Basename 06/11/12 0545 06/10/12 0620  NA 124* 130*  K 3.7 3.5  CL 89* 96  CO2 22 24  GLUCOSE 112* 131*  BUN 14 11  CREATININE 0.80 0.78  CALCIUM 8.1* 8.2*   PT/INR No results found for this basename: LABPROT:2,INR:2 in the last 72 hours ABG No results found for this basename: PHART:2,PCO2:2,PO2:2,HCO3:2 in the last 72 hours  Studies/Results: Dg Chest 1 View  06/10/2012  *RADIOLOGY REPORT*  Clinical Data: Shortness of breath.  Evaluate for infiltrate.  CHEST - 1 VIEW  Comparison: Chest radiograph 06/07/2012 and chest CT 05/09/2012  Findings: Distal tip of right upper extremity PICC projects over the distal superior vena cava. Nasogastric tube can be followed into the stomach and continues below the edge of the image.   Heart, mediastinal, and hilar contours appear stable and within normal limits.  Lung volumes are low bilaterally.  There is peribronchial thickening at both lung bases, and patchy opacities at the lung bases, left greater than right, with obscuration of the  the left costophrenic angle. Aeration of the upper lung fields is slightly improved on these examination.  No definite evidence of pulmonary edema.  Skin staples project over the upper abdomen.  Gas is seen within some upper normal caliber bowel loops in the upper abdomen.  IMPRESSION: 1. Bibasilar peribronchial thickening and patchy opacities, left greater than right. Findings may reflect pneumonia, aspiration, and/or atelectasis. Aeration at the lung bases is without significant change compared to a chest radiograph of 06/06/2012. Aeration of the upper lung fields appears improved.  2.  Cannot exclude a left pleural effusion.   Original Report Authenticated By: Britta Mccreedy, M.D.     Anti-infectives: Anti-infectives     Start     Dose/Rate Route Frequency Ordered Stop   06/10/12 1000   piperacillin-tazobactam (ZOSYN) IVPB 3.375 g        3.375 g 12.5 mL/hr over 240 Minutes Intravenous Every 8 hours 06/10/12 0848     06/06/12 1800   ertapenem (INVANZ) 1 g in sodium chloride 0.9 % 50 mL IVPB        1 g 100 mL/hr over 30 Minutes Intravenous Every 24 hours 06/06/12 1740 06/06/12 1906   06/05/12 1433   ertapenem (INVANZ) 1 g in sodium chloride 0.9 %  50 mL IVPB        1 g 100 mL/hr over 30 Minutes Intravenous 60 min pre-op 06/05/12 1433 06/06/12 1030          Assessment/Plan: s/p Procedure(s) (LRB) with comments: WHIPPLE PROCEDURE (N/A) - Pancreatico duodenectomy LYSIS OF ADHESION (N/A) CHOLECYSTECTOMY (N/A) PANCREATIC STENT PLACEMENT (N/A) - Removal of biliary stent; Placement of pacreatic stent LYMPH NODE DISSECTION (N/A) - Portal lymph node dissection  Hyponatremia, improved with NS, but was decreased to KVO.  Now with recurrent  hyponatremia  Hypochloremia back.  Keep NS at 75 ml/hr.   Hypokalemia - better.   HTN - on prn hydralazine.  Metoprolol Hyperglycemia - SSI. Pulmonary toilet. Path- ampullary carcinoma T3N1.   ID - vanc/zosyn for presumed PNA.   PT consult for ambulation.     LOS: 5 days    [facility] 06/11/2012

## 2012-06-12 LAB — MAGNESIUM: Magnesium: 2 mg/dL (ref 1.5–2.5)

## 2012-06-12 LAB — GLUCOSE, CAPILLARY
Glucose-Capillary: 117 mg/dL — ABNORMAL HIGH (ref 70–99)
Glucose-Capillary: 143 mg/dL — ABNORMAL HIGH (ref 70–99)

## 2012-06-12 LAB — CBC
Hemoglobin: 9.8 g/dL — ABNORMAL LOW (ref 13.0–17.0)
MCH: 28.1 pg (ref 26.0–34.0)
Platelets: 192 10*3/uL (ref 150–400)
RBC: 3.49 MIL/uL — ABNORMAL LOW (ref 4.22–5.81)
WBC: 12.5 10*3/uL — ABNORMAL HIGH (ref 4.0–10.5)

## 2012-06-12 LAB — COMPREHENSIVE METABOLIC PANEL
ALT: 91 U/L — ABNORMAL HIGH (ref 0–53)
AST: 44 U/L — ABNORMAL HIGH (ref 0–37)
Albumin: 1.8 g/dL — ABNORMAL LOW (ref 3.5–5.2)
Alkaline Phosphatase: 116 U/L (ref 39–117)
BUN: 15 mg/dL (ref 6–23)
CO2: 21 mEq/L (ref 19–32)
Calcium: 7.9 mg/dL — ABNORMAL LOW (ref 8.4–10.5)
Chloride: 92 mEq/L — ABNORMAL LOW (ref 96–112)
Creatinine, Ser: 0.87 mg/dL (ref 0.50–1.35)
GFR calc Af Amer: >90 mL/min (ref >90)
GFR calc non Af Amer: 89 mL/min — ABNORMAL LOW (ref >90)
Glucose, Bld: 111 mg/dL — ABNORMAL HIGH (ref 70–99)
Potassium: 3.6 mEq/L (ref 3.5–5.1)
Sodium: 125 mEq/L — ABNORMAL LOW (ref 135–145)
Total Bilirubin: 1.8 mg/dL — ABNORMAL HIGH (ref 0.3–1.2)
Total Protein: 5.8 g/dL — ABNORMAL LOW (ref 6.0–8.3)

## 2012-06-12 LAB — PHOSPHORUS: Phosphorus: 4.2 mg/dL (ref 2.3–4.6)

## 2012-06-12 MED ORDER — CLINIMIX E/DEXTROSE (5/20) 5 % IV SOLN
INTRAVENOUS | Status: AC
  Administered 2012-06-12: 18:00:00 via INTRAVENOUS
  Filled 2012-06-12: qty 2000

## 2012-06-12 MED ORDER — OXYCODONE HCL 5 MG/5ML PO SOLN
10.0000 mg | Freq: Three times a day (TID) | ORAL | Status: DC
  Administered 2012-06-12 – 2012-06-14 (×9): 10 mg via ORAL
  Filled 2012-06-12 (×9): qty 10

## 2012-06-12 NOTE — Progress Notes (Signed)
Patient ID: Colin Castro, male   DOB: 01-Nov-1945, 66 y.o.   MRN: [id number] 6 Days Post-Op  Subjective: Continues to feel better.  No n/v with clears.  NGT out.  + flatus and BM.     Objective: Vital signs in last 24 hours: Temp:  [98 F (36.7 C)-99.6 F (37.6 C)] 99.6 F (37.6 C) (10/24 0549) Pulse Rate:  [81-88] 84  (10/24 0549) Resp:  [16-27] 22  (10/24 0549) BP: (115-128)/(53-68) 115/64 mmHg (10/24 0549) SpO2:  [91 %-98 %] 93 % (10/24 0549)    Intake/Output from previous day: 10/23 0701 - 10/24 0700 In: 2881.3 [I.V.:491.3; NG/GT:1550; IV Piggyback:200; TPN:640] Out: 1494 [Urine:1450; Drains:44] Intake/Output this shift:    General appearance: alert, cooperative and no distress, smiling. Head: Normocephalic, without obvious abnormality, atraumatic Resp: no respiratory distress GI: soft, approp tender.  Dressings c/d/i.  Jps serous-serosang Incision without erythema or drainage.  Staples intact Extremities: extremities normal, atraumatic, no cyanosis or edema  Lab Results:   Basename 06/12/12 0435 06/11/12 0545  WBC 12.5* 13.4*  HGB 9.8* 10.0*  HCT 27.7* 28.7*  PLT 192 214   BMET  Basename 06/12/12 0435 06/11/12 0545  NA 125* 124*  K 3.6 3.7  CL 92* 89*  CO2 21 22  GLUCOSE 111* 112*  BUN 15 14  CREATININE 0.87 0.80  CALCIUM 7.9* 8.1*   PT/INR No results found for this basename: LABPROT:2,INR:2 in the last 72 hours ABG No results found for this basename: PHART:2,PCO2:2,PO2:2,HCO3:2 in the last 72 hours  Studies/Results: Dg Chest 1 View  06/10/2012  *RADIOLOGY REPORT*  Clinical Data: Shortness of breath.  Evaluate for infiltrate.  CHEST - 1 VIEW  Comparison: Chest radiograph 06/07/2012 and chest CT 05/09/2012  Findings: Distal tip of right upper extremity PICC projects over the distal superior vena cava. Nasogastric tube can be followed into the stomach and continues below the edge of the image.  Heart, mediastinal, and hilar contours appear stable and within  normal limits.  Lung volumes are low bilaterally.  There is peribronchial thickening at both lung bases, and patchy opacities at the lung bases, left greater than right, with obscuration of the  the left costophrenic angle. Aeration of the upper lung fields is slightly improved on these examination.  No definite evidence of pulmonary edema.  Skin staples project over the upper abdomen.  Gas is seen within some upper normal caliber bowel loops in the upper abdomen.  IMPRESSION: 1. Bibasilar peribronchial thickening and patchy opacities, left greater than right. Findings may reflect pneumonia, aspiration, and/or atelectasis. Aeration at the lung bases is without significant change compared to a chest radiograph of 06/06/2012. Aeration of the upper lung fields appears improved.  2.  Cannot exclude a left pleural effusion.   Original Report Authenticated By: Britta Mccreedy, M.D.     Anti-infectives: Anti-infectives     Start     Dose/Rate Route Frequency Ordered Stop   06/11/12 1000   vancomycin (VANCOCIN) IVPB 1000 mg/200 mL premix        1,000 mg 200 mL/hr over 60 Minutes Intravenous Every 12 hours 06/11/12 0924     06/10/12 1000   piperacillin-tazobactam (ZOSYN) IVPB 3.375 g        3.375 g 12.5 mL/hr over 240 Minutes Intravenous Every 8 hours 06/10/12 0848     06/06/12 1800   ertapenem (INVANZ) 1 g in sodium chloride 0.9 % 50 mL IVPB        1 g 100 mL/hr over 30 Minutes  Intravenous Every 24 hours 06/06/12 1740 06/06/12 1906   06/05/12 1433   ertapenem (INVANZ) 1 g in sodium chloride 0.9 % 50 mL IVPB        1 g 100 mL/hr over 30 Minutes Intravenous 60 min pre-op 06/05/12 1433 06/06/12 1030          Assessment/Plan: s/p Procedure(s) (LRB) with comments: WHIPPLE PROCEDURE (N/A) - Pancreatico duodenectomy LYSIS OF ADHESION (N/A) CHOLECYSTECTOMY (N/A) PANCREATIC STENT PLACEMENT (N/A) - Removal of biliary stent; Placement of pacreatic stent LYMPH NODE DISSECTION (N/A) - Portal lymph node  dissection  Hyponatremia, improved with NS, but was decreased to KVO.  Now with recurrent hyponatremia.  Will check urine for chloride and for FeNa. Hypochloremia back.  Keep NS at 75 ml/hr.   Hypokalemia - better.   HTN - on prn hydralazine.  Metoprolol Hyperglycemia - SSI. Pulmonary toilet. Path- ampullary carcinoma T3N1. Will need post op chemo/chemoradiation.    ID - vanc/zosyn for presumed PNA.   PT consult for ambulation.     LOS: 6 days    [facility] 06/12/2012

## 2012-06-12 NOTE — Progress Notes (Signed)
PARENTERAL NUTRITION CONSULT NOTE  Pharmacy Consult for TNA Indication: bowel rest s/p Whipple procedure  No Known Allergies  Patient Measurements: Height: 5\' 6"  (167.6 cm) Weight: 153 lb 3.5 oz (69.5 kg) IBW/kg (Calculated) : 63.8   Vital Signs: Temp: 99.6 F (37.6 C) (10/24 0549) Temp src: Oral (10/24 0549) BP: 115/64 mmHg (10/24 0549) Pulse Rate: 84  (10/24 0549) Intake/Output from previous day: 10/23 0701 - 10/24 0700 In: 2881.3 [I.V.:491.3; NG/GT:1550; IV Piggyback:200; TPN:640] Out: 1494 [Urine:1450; Drains:44] Intake/Output from this shift: Total I/O In: -  Out: 200 [Urine:200]  Labs:  [facility] 06/12/12 0435 06/11/12 0545  WBC 12.5* 13.4*  HGB 9.8* 10.0*  HCT 27.7* 28.7*  PLT 192 214  APTT -- --  INR -- --     Basename 06/12/12 0435 06/11/12 0545 06/10/12 0620  NA 125* 124* 130*  K 3.6 3.7 3.5  CL 92* 89* 96  CO2 21 22 24   GLUCOSE 111* 112* 131*  BUN 15 14 11   CREATININE 0.87 0.80 0.78  LABCREA -- -- --  CREAT24HRUR -- -- --  CALCIUM 7.9* 8.1* 8.2*  MG 2.0 -- --  PHOS 4.2 -- --  PROT 5.8* 5.8* 5.8*  ALBUMIN 1.8* 1.9* 2.0*  AST 44* 34 46*  ALT 91* 102* 136*  ALKPHOS 116 116 115  BILITOT 1.8* 1.9* 1.4*  BILIDIR -- -- --  IBILI -- -- --  PREALBUMIN -- -- --  TRIG -- -- --  CHOLHDL -- -- --  CHOL -- -- --  Corrected Ca = 9.7 Estimated Creatinine Clearance: 76.4 ml/min (by C-G formula based on Cr of 0.87).    Basename 06/12/12 0715 06/12/12 0355 06/11/12 2344  GLUCAP 130* 117* 143*   Nutritional Goals:  Per RD recs 10/20: 1800-2100 Kcal/d and 95-105g protein/d. Clinimix E 5/20 at 80 ml/hr + IVFE 20% at 95ml/hr MWF to provide: 96 g/day protein and 2169 Kcal/day MWF, 1689 Kcal/day STTHS (Avg. 1896 Kcal/day weekly).  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 Underwent Whipple procedure and placement of pancreatic stent on 10/18. TNA started 10/19.  Electrolytes: Na low (unable to adjust in premixed TNA) - Md aware and wants to keep NS at current rate, K WNL  LFTs: trending downward, with Tbili rising some now  TGs: WNL  Prealbumin: 20 (10/19) ---> 10.2 (10/21) - falling, but drawn prior to achieving goal rate on postop TNA  Plan:  Continue Clinimix-E 5/20 at 82ml/hr (goal rate).  Fat emulsion 20% at 57ml/hr (MWF only due to ongoing shortage).  TNA to contain standard multivitamins and trace elements (MWF only due to ongoing shortage).  Continue moderate scale Novolog SSI q4h.   TNA lab panels on Mondays & Thursdays.    F/U daily.   If patient tolerates FL diet, should be able to begin weaning TNA   Hessie Knows, PharmD, BCPS Pager [phone] 06/12/2012 10:00 AM

## 2012-06-12 NOTE — Progress Notes (Signed)
Physical Therapy Treatment Patient Details Name: Colin Castro MRN: [id number] DOB: 09-17-1945 Today's Date: 06/12/2012 Time: 7829-5621 PT Time Calculation (min): 22 min  PT Assessment / Plan / Recommendation Comments on Treatment Session  Progressing slowly with mobility. Requires Min encourgament for participation. Plan is for home after stay. Recommend HHPT 24 hour supervision/assist.     Follow Up Recommendations  Home health PT;Supervision/Assistance - 24 hour     Does the patient have the potential to tolerate intense rehabilitation     Barriers to Discharge        Equipment Recommendations  Rolling walker with 5" wheels    Recommendations for Other Services OT consult  Frequency Min 3X/week   Plan Discharge plan remains appropriate    Precautions / Restrictions Precautions Precautions: Fall Precaution Comments: abdominal surgery, drains(2) Restrictions Weight Bearing Restrictions: No   Pertinent Vitals/Pain 6/10 abdomen    Mobility  Bed Mobility Bed Mobility: Supine to Sit Supine to Sit: 3: Mod assist Details for Bed Mobility Assistance: Assist for trunk to upright.  Transfers Transfers: Sit to Stand;Stand to Sit Sit to Stand: 4: Min assist;From bed Stand to Sit: 4: Min assist;To chair/3-in-1 Details for Transfer Assistance: VCs safety, technique, hand placement. Assist to rise, stabilize, control descent.  Ambulation/Gait Ambulation/Gait Assistance: 4: Min assist Ambulation Distance (Feet): 175 Feet Assistive device: Rolling walker Ambulation/Gait Assistance Details: VCs safety, pacing, distance from RW, pursed lip/deep breathing. Multiple standing rest breaks due to drop in O2 sats. Ambulated on 2L O2.  Gait Pattern: Step-through pattern;Decreased stride length    Exercises     PT Diagnosis:    PT Problem List:   PT Treatment Interventions:     PT Goals Acute Rehab PT Goals Pt will go Supine/Side to Sit: with supervision PT Goal: Supine/Side to Sit -  Progress: Progressing toward goal Pt will go Sit to Stand: with supervision PT Goal: Sit to Stand - Progress: Progressing toward goal Pt will Ambulate: >150 feet;with supervision;with least restrictive assistive device PT Goal: Ambulate - Progress: Progressing toward goal  Visit Information  Last PT Received On: 06/12/12 Assistance Needed: +1    Subjective Data  Subjective: "Okay..." Patient Stated Goal: Home   Cognition  Overall Cognitive Status: Appears within functional limits for tasks assessed/performed Arousal/Alertness: Awake/alert Behavior During Session: [facility] for tasks performed    Balance     End of Session PT - End of Session Activity Tolerance: Patient limited by fatigue;Patient limited by pain Patient left: in chair;with call bell/phone within reach;with family/visitor present   GP     [name] [facility] 06/12/2012, 3:31 PM [phone]

## 2012-06-13 ENCOUNTER — Encounter (HOSPITAL_COMMUNITY): Payer: Self-pay | Admitting: General Surgery

## 2012-06-13 LAB — CBC
Hemoglobin: 9.7 g/dL — ABNORMAL LOW (ref 13.0–17.0)
MCHC: 34.2 g/dL (ref 30.0–36.0)
RDW: 14.8 % (ref 11.5–15.5)
WBC: 12.6 10*3/uL — ABNORMAL HIGH (ref 4.0–10.5)

## 2012-06-13 LAB — GLUCOSE, CAPILLARY
Glucose-Capillary: 91 mg/dL (ref 70–99)
Glucose-Capillary: 131 mg/dL — ABNORMAL HIGH (ref 70–99)
Glucose-Capillary: 113 mg/dL — ABNORMAL HIGH (ref 70–99)

## 2012-06-13 LAB — CREATININE, URINE, RANDOM: Creatinine, Urine: 52.8 mg/dL

## 2012-06-13 LAB — COMPREHENSIVE METABOLIC PANEL
ALT: 97 U/L — ABNORMAL HIGH (ref 0–53)
Albumin: 2 g/dL — ABNORMAL LOW (ref 3.5–5.2)
Alkaline Phosphatase: 123 U/L — ABNORMAL HIGH (ref 39–117)
Chloride: 92 mEq/L — ABNORMAL LOW (ref 96–112)
Glucose, Bld: 95 mg/dL (ref 70–99)
Potassium: 3.7 mEq/L (ref 3.5–5.1)
Sodium: 124 mEq/L — ABNORMAL LOW (ref 135–145)
Total Bilirubin: 1.5 mg/dL — ABNORMAL HIGH (ref 0.3–1.2)
Total Protein: 6 g/dL (ref 6.0–8.3)

## 2012-06-13 MED ORDER — SODIUM CHLORIDE 0.9 % IJ SOLN
10.0000 mL | INTRAMUSCULAR | Status: DC | PRN
  Administered 2012-06-13: 20 mL

## 2012-06-13 MED ORDER — ENSURE COMPLETE PO LIQD
237.0000 mL | Freq: Two times a day (BID) | ORAL | Status: DC
  Administered 2012-06-13 – 2012-06-25 (×8): 237 mL via ORAL

## 2012-06-13 MED ORDER — ZINC TRACE METAL 1 MG/ML IV SOLN
INTRAVENOUS | Status: AC
  Administered 2012-06-13: 17:00:00 via INTRAVENOUS
  Filled 2012-06-13: qty 2000

## 2012-06-13 MED ORDER — FAT EMULSION 20 % IV EMUL
240.0000 mL | INTRAVENOUS | Status: AC
  Administered 2012-06-13: 240 mL via INTRAVENOUS
  Filled 2012-06-13: qty 250

## 2012-06-13 MED ORDER — VANCOMYCIN HCL 1000 MG IV SOLR
1250.0000 mg | Freq: Two times a day (BID) | INTRAVENOUS | Status: DC
  Administered 2012-06-13 – 2012-06-15 (×4): 1250 mg via INTRAVENOUS
  Filled 2012-06-13 (×5): qty 1250

## 2012-06-13 MED ORDER — SODIUM CHLORIDE 1 G PO TABS
2.0000 g | ORAL_TABLET | Freq: Two times a day (BID) | ORAL | Status: DC
  Administered 2012-06-13 – 2012-06-14 (×3): 2 g via ORAL
  Filled 2012-06-13 (×6): qty 2

## 2012-06-13 NOTE — Progress Notes (Signed)
Nutrition Follow-up  Intervention: TPN per pharmacy. Recommend MD consider appetite stimulant. Will monitor.   Diet Order: Full liquid  TPN: Clinimix E 5/20 @ 80 ml/hr.  Lipids (20% IVFE @ 10 ml/hr), multivitamins, and trace elements are provided 3 times weekly (MWF) due to national backorder.  Provides 1896 kcal and 96 grams protein daily (based on weekly average).  Meets 105% minimum estimated kcal and 101% minimum estimated protein needs.  Additional IVF with NS @ 10-20 ml/hr.  - Per nursing, pt has been confused today. Family reports pt has only consumed water and 1/4 of Ensure bottled today. They say he has no appetite.   - Pt with persistent low sodium - Total bilirubin remains elevated but trending down - CBGs controlled - PALB down from 20mg /dL on 16/10 to 96.0 mg/dL on 45/40  Meds: Scheduled Meds:   . antiseptic oral rinse  15 mL Mouth Rinse BID  . enoxaparin  40 mg Subcutaneous Q24H  . feeding supplement  237 mL Oral BID BM  . insulin aspart  0-15 Units Subcutaneous Q4H  . metoprolol  5 mg Intravenous Q6H  . morphine   Intravenous Q4H  . oxyCODONE  10 mg Oral Q8H  . pantoprazole (PROTONIX) IV  40 mg Intravenous QHS  . piperacillin-tazobactam (ZOSYN)  IV  3.375 g Intravenous Q8H  . sodium chloride  2 g Oral BID WC  . vancomycin  1,000 mg Intravenous Q12H   Continuous Infusions:   . sodium chloride 20 mL/hr at 06/13/12 1246  . TPN (CLINIMIX) +/- additives 80 mL/hr at 06/11/12 1806   And  . fat emulsion 240 mL (06/11/12 1806)  . TPN (CLINIMIX) +/- additives     And  . fat emulsion    . TPN (CLINIMIX) +/- additives 80 mL/hr at 06/12/12 1736   PRN Meds:.diphenhydrAMINE, diphenhydrAMINE, hydrALAZINE, morphine injection, naloxone, ondansetron (ZOFRAN) IV, ondansetron (ZOFRAN) IV, ondansetron, sodium chloride, sodium chloride  Labs:  CMP     Component Value Date/Time   NA 124* 06/13/2012 0430   K 3.7 06/13/2012 0430   CL 92* 06/13/2012 0430   CO2 21 06/13/2012 0430    GLUCOSE 95 06/13/2012 0430   BUN 16 06/13/2012 0430   CREATININE 0.88 06/13/2012 0430   CALCIUM 8.0* 06/13/2012 0430   PROT 6.0 06/13/2012 0430   ALBUMIN 2.0* 06/13/2012 0430   AST 50* 06/13/2012 0430   ALT 97* 06/13/2012 0430   ALKPHOS 123* 06/13/2012 0430   BILITOT 1.5* 06/13/2012 0430   GFRNONAA 88* 06/13/2012 0430   GFRAA >90 06/13/2012 0430   Prealbumin  Date Value Range Status  06/09/2012 10.2* 17.0 - 34.0 mg/dL Final   CBG (last 3)   Basename 06/13/12 1213 06/13/12 0811 06/13/12 0355  GLUCAP 137* 131* 91      Intake/Output Summary (Last 24 hours) at 06/13/12 1529 Last data filed at 06/13/12 1416  Gross per 24 hour  Intake 5805.84 ml  Output   1190 ml  Net 4615.84 ml   Last BM - 10/25  Weight Status: No new weights  Estimated needs:   1800-2100 calories 95-105g protein  Nutrition Dx: Inadequate oral intake r/t altered GI function AEB NPO status - no longer applies as pt on full liquid diet.  New nutrition dx: Inadequate oral intake r/t poor appetite AEB <25% meal intake  Goal: TPN + lipids to meet 100% of estimated needs until diet can be advanced and tolerated - met.  New goal: Pt to consume >50% of meals/supplements  Monitor:  Weights, labs, intake, TPN, appetite    Levon Hedger MS, RD, [place] [phone] Pager [phone] After Hours Pager

## 2012-06-13 NOTE — Progress Notes (Signed)
Patient ID: Colin Castro, male   DOB: 1946-03-24, 66 y.o.   MRN: [id number] 7 Days Post-Op  Subjective: Pt with poor po intake but no nausea/vomiting.  Pain improving.  Out of bed with PT.  + BMs  Objective: Vital signs in last 24 hours: Temp:  [97.4 F (36.3 C)-98.9 F (37.2 C)] 98.1 F (36.7 C) (10/25 0600) Pulse Rate:  [72-90] 72  (10/25 0600) Resp:  [16-24] 22  (10/25 0600) BP: (107-144)/(46-73) 107/64 mmHg (10/25 0600) SpO2:  [90 %-96 %] 93 % (10/25 0600)    Intake/Output from previous day: 10/24 0701 - 10/25 0700 In: 7045.3 [P.O.:80; I.V.:2472.5; IV Piggyback:1000; TPN:3492.8] Out: 1865 [Urine:1825; Drains:40] Intake/Output this shift:    General appearance: alert, cooperative and no distress, smiling. Head: Normocephalic, without obvious abnormality, atraumatic Resp: no respiratory distress GI: soft, approp tender.  Jps serous-serosang Incision without erythema or drainage.  Staples intact Extremities: extremities normal, atraumatic, no cyanosis or edema  Lab Results:   Basename 06/13/12 0430 06/12/12 0435  WBC 12.6* 12.5*  HGB 9.7* 9.8*  HCT 28.4* 27.7*  PLT 205 192   BMET  Basename 06/13/12 0430 06/12/12 0435  NA 124* 125*  K 3.7 3.6  CL 92* 92*  CO2 21 21  GLUCOSE 95 111*  BUN 16 15  CREATININE 0.88 0.87  CALCIUM 8.0* 7.9*   PT/INR No results found for this basename: LABPROT:2,INR:2 in the last 72 hours ABG No results found for this basename: PHART:2,PCO2:2,PO2:2,HCO3:2 in the last 72 hours  Studies/Results: No results found.  Anti-infectives: Anti-infectives     Start     Dose/Rate Route Frequency Ordered Stop   06/11/12 1000   vancomycin (VANCOCIN) IVPB 1000 mg/200 mL premix        1,000 mg 200 mL/hr over 60 Minutes Intravenous Every 12 hours 06/11/12 0924     06/10/12 1000   piperacillin-tazobactam (ZOSYN) IVPB 3.375 g        3.375 g 12.5 mL/hr over 240 Minutes Intravenous Every 8 hours 06/10/12 0848     06/06/12 1800   ertapenem  (INVANZ) 1 g in sodium chloride 0.9 % 50 mL IVPB        1 g 100 mL/hr over 30 Minutes Intravenous Every 24 hours 06/06/12 1740 06/06/12 1906   06/05/12 1433   ertapenem (INVANZ) 1 g in sodium chloride 0.9 % 50 mL IVPB        1 g 100 mL/hr over 30 Minutes Intravenous 60 min pre-op 06/05/12 1433 06/06/12 1030          Assessment/Plan: s/p Procedure(s) (LRB) with comments: WHIPPLE PROCEDURE (N/A) - Pancreatico duodenectomy LYSIS OF ADHESION (N/A) CHOLECYSTECTOMY (N/A) PANCREATIC STENT PLACEMENT (N/A) - Removal of biliary stent; Placement of pacreatic stent LYMPH NODE DISSECTION (N/A) - Portal lymph node dissection  Hyponatremia, improved with NS, but now stalled.  FeNa >1.  Will try KVO IVF and add salt tabs.   Hypokalemia - better.   HTN - on prn hydralazine.  Metoprolol Hyperglycemia - SSI. Pulmonary toilet. Path- ampullary carcinoma T3N1. Will need post op chemo/chemoradiation.    ID - vanc/zosyn for presumed PNA d 3   PT consult for ambulation.    Nutrition consult for poor PO intake.     LOS: 7 days    [facility] 06/13/2012

## 2012-06-13 NOTE — Progress Notes (Signed)
Physical Therapy Treatment Patient Details Name: Colin Castro MRN: [id number] DOB: 03-23-1946 Today's Date: 06/13/2012 Time: 4098-1191 PT Time Calculation (min): 24 min  PT Assessment / Plan / Recommendation Comments on Treatment Session  Continuing to progress with mobility. HHPT 24 hour supervision.     Follow Up Recommendations  Home health PT;Supervision/Assistance - 24 hour     Does the patient have the potential to tolerate intense rehabilitation     Barriers to Discharge        Equipment Recommendations  Rolling walker with 5" wheels    Recommendations for Other Services    Frequency Min 3X/week   Plan Discharge plan remains appropriate    Precautions / Restrictions Precautions Precautions: Fall Precaution Comments: abdominal surgery, drains (2) R side Restrictions Weight Bearing Restrictions: No   Pertinent Vitals/Pain Abdomen-unrated    Mobility  Bed Mobility Details for Bed Mobility Assistance: Pt sitting EOB with gown off when therapist entered. One drain appeared to be opened-notified Charity fundraiser. Student RN assessed/closed drain during session.  Transfers Transfers: Sit to Stand;Stand to Sit Sit to Stand: 4: Min guard;From bed;With upper extremity assist Stand to Sit: 4: Min guard;To chair/3-in-1;With armrests Details for Transfer Assistance: VCs safety, technique, hand placement.  Ambulation/Gait Ambulation/Gait Assistance: 4: Min assist Ambulation Distance (Feet): 500 Feet Assistive device: Rolling walker Ambulation/Gait Assistance Details: VCS safety, pacing, breathing technique. Multiple standing rest breaks to allow for recovery-O2sats fluctuating, 84-93% during walk, on RA. Fatigues fairly easily still Gait Pattern: Step-through pattern;Decreased stride length    Exercises     PT Diagnosis:    PT Problem List:   PT Treatment Interventions:     PT Goals Acute Rehab PT Goals Pt will go Sit to Stand: with supervision PT Goal: Sit to Stand - Progress:  Progressing toward goal Pt will Ambulate: >150 feet;with supervision;with least restrictive assistive device PT Goal: Ambulate - Progress: Progressing toward goal  Visit Information  Last PT Received On: 06/13/12 Assistance Needed: +1    Subjective Data  Subjective: "Okay...lets walk" Patient Stated Goal: Home   Cognition  Overall Cognitive Status: Appears within functional limits for tasks assessed/performed Arousal/Alertness: Awake/Castro Behavior During Session: [facility] for tasks performed    Balance     End of Session PT - End of Session Activity Tolerance: Patient tolerated treatment well;Patient limited by fatigue Patient left: in chair;with call bell/phone within reach   GP     Colin Castro [facility] 06/13/2012, 11:20 AM [phone]

## 2012-06-13 NOTE — Progress Notes (Deleted)
ANTIBIOTIC CONSULT NOTE - FOLLOW UP  Pharmacy Consult for Vanc, Zosyn Indication: presumed HCAP  No Known Allergies  Patient Measurements: Height: 5\' 6"  (167.6 cm) Weight: 153 lb 3.5 oz (69.5 kg) IBW/kg (Calculated) : 63.8   Vital Signs: Temp: 98.1 F (36.7 C) (10/25 0600) Temp src: Oral (10/25 0600) BP: 107/64 mmHg (10/25 0600) Pulse Rate: 72  (10/25 0600)  Labs:  Basename 06/13/12 0430 06/12/12 1155 06/12/12 0435 06/11/12 0545  WBC 12.6* -- 12.5* 13.4*  HGB 9.7* -- 9.8* 10.0*  PLT 205 -- 192 214  LABCREA -- 52.8 -- --  CREATININE 0.88 -- 0.87 0.80   Estimated Creatinine Clearance: 75.5 ml/min (by C-G formula based on Cr of 0.88).  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  10/22 C-xray with bibasilar peribronchial thickening and patchy opacities, L > R, cannot r/o pneumonia. Surgery started Vanc/Zosyn for presumed HCAP given recent admission and home TNA. Pt now in ICU with AMS, chest CT shows RLL.  Day#6 Zosyn, Day#5 Vancomycin  Afebrile, WBC mildly elevated  Renal function stable with CrCl 83 (N 92).  MRSA PCR negative on 10/15, no other cultures obtained.    Goal of Therapy:  Vancomycin trough level 15-20 mcg/ml Appropriate renal dosing of Zosyn  Plan:   Vancomycin trough prior to 2200 dose tonight to assess dose increase from 10/25  Continue with Zosyn 3.375 gm IV q8h  Follow up renal function & cultures, duration of therapy  Loralee Pacas, PharmD, BCPS Pager: [phone] 06/15/2012 8:42 AM

## 2012-06-13 NOTE — Progress Notes (Signed)
PARENTERAL NUTRITION CONSULT NOTE - FOLLOW UP  Pharmacy Consult for TNA Indication: bowel rest s/p Whipple procedure  No Known Allergies  Patient Measurements: Height: 5\' 6"  (167.6 cm) Weight: 153 lb 3.5 oz (69.5 kg) IBW/kg (Calculated) : 63.8   Vital Signs: Temp: 98.1 F (36.7 C) (10/25 0600) Temp src: Oral (10/25 0600) BP: 107/64 mmHg (10/25 0600) Pulse Rate: 72  (10/25 0600) Intake/Output from previous day: 10/24 0701 - 10/25 0700 In: 7045.3 [P.O.:80; I.V.:2472.5; IV Piggyback:1000; TPN:3492.8] Out: 1865 [Urine:1825; Drains:40] Intake/Output from this shift:    Labs:  [facility] 06/13/12 0430 06/12/12 0435 06/11/12 0545  WBC 12.6* 12.5* 13.4*  HGB 9.7* 9.8* 10.0*  HCT 28.4* 27.7* 28.7*  PLT 205 192 214  APTT -- -- --  INR -- -- --     Basename 06/13/12 0430 06/12/12 1155 06/12/12 0435 06/11/12 0545  NA 124* -- 125* 124*  K 3.7 -- 3.6 3.7  CL 92* -- 92* 89*  CO2 21 -- 21 22  GLUCOSE 95 -- 111* 112*  BUN 16 -- 15 14  CREATININE 0.88 -- 0.87 0.80  LABCREA -- 52.8 -- --  CREAT24HRUR -- -- -- --  CALCIUM 8.0* -- 7.9* 8.1*  MG -- -- 2.0 --  PHOS -- -- 4.2 --  PROT 6.0 -- 5.8* 5.8*  ALBUMIN 2.0* -- 1.8* 1.9*  AST 50* -- 44* 34  ALT 97* -- 91* 102*  ALKPHOS 123* -- 116 116  BILITOT 1.5* -- 1.8* 1.9*  BILIDIR -- -- -- --  IBILI -- -- -- --  PREALBUMIN -- -- -- --  TRIG -- -- -- --  CHOLHDL -- -- -- --  CHOL -- -- -- --   Estimated Creatinine Clearance: 75.5 ml/min (by C-G formula based on Cr of 0.88).    Basename 06/13/12 0811 06/13/12 0355 06/12/12 2351  GLUCAP 131* 91 117*   CBGs & Insulin requirements past 24 hours: CBGs < 150, required 8 units moderate SSI  Nutritional Goals:  Per RD recs 10/20: 1800-2100 Kcal/d and 95-105g protein/d. Clinimix E 5/20 at 80 ml/hr + IVFE 20% at 47ml/hr MWF to provide: 96 g/day protein and 2169 Kcal/day MWF, 1689 Kcal/day STTHS (Avg. 1869 Kcal/day weekly).  Current nutrition:  Diet: Full liquid starting 10/24.   Ensure Completed BID starting 10/25.  TNA: Clinimix E 5/20 @ goal rate of 80 ml/hr, lipids 20% @ 35ml/hr on MWF only mIVF: NS @ KVO starting 10/25 per MD for hyponatremia   Assessment:  66 YOM found to have malignant stricture of the common bile duct during 9/12 admission. TNA was started inpatient on 9/21 and pt discharged 10/3 on TNA with eventual plan for surgery after nutritional status improved. Pt underwent Whipple procedure and placement of pancreatic stent on 10/18. Pharmacy asked to start TNA on 10/19. Home TNA per [facility] was: over 16hrs daily. 105g protein. No lipids TWThSS: 1439Kcal. With lipids MF: 2651Kcal  for avg. 1785Kcal/d.   10/25: Diet now advanced to full liquid, only 80mL po intake documented 10/24.  MD ordered for Ensure Complete to start 2 times daily between meals this AM. Plan continue TNA at goal rate at this time until PO intake increase.  IVF now [facility] for hyponatremia.  Labs Renal/Hepatic function: Scr wnl, alk phos slightly up, AST/ALT improving Electrolytes:  Na consistently low, other lytes wnl Pre-Albumin: 20 (10/19), 10.2 (10/21) - falling, but drawn prior to achieving goal rate on postop TNA TG/Cholesterol: wnl (last 10/21) CBGs: controlled  Plan:  At  1800 tonight  Continue TNA with Clinimix E 5/20 @ goal rate of 80 ml/hr  TNA to contain IV fat emulsion, standard multivitamins and trace elements only on MWF only due to ongoing shortage  Continue moderate SSI and CBG checks  Follow up PO toleration and wean TNA when able  TNA labs Monday/Thursdays  Pharmacy will follow up daily  Geoffry Paradise, PharmD, BCPS Pager: [phone] 8:49 AM Pharmacy #: [phone]

## 2012-06-14 LAB — BASIC METABOLIC PANEL
BUN: 16 mg/dL (ref 6–23)
CO2: 20 mEq/L (ref 19–32)
Chloride: 94 mEq/L — ABNORMAL LOW (ref 96–112)
Creatinine, Ser: 0.82 mg/dL (ref 0.50–1.35)
Potassium: 3.4 mEq/L — ABNORMAL LOW (ref 3.5–5.1)

## 2012-06-14 LAB — GLUCOSE, CAPILLARY
Glucose-Capillary: 181 mg/dL — ABNORMAL HIGH (ref 70–99)
Glucose-Capillary: 159 mg/dL — ABNORMAL HIGH (ref 70–99)
Glucose-Capillary: 150 mg/dL — ABNORMAL HIGH (ref 70–99)
Glucose-Capillary: 172 mg/dL — ABNORMAL HIGH (ref 70–99)
Glucose-Capillary: 143 mg/dL — ABNORMAL HIGH (ref 70–99)

## 2012-06-14 MED ORDER — POTASSIUM CHLORIDE CRYS ER 20 MEQ PO TBCR
40.0000 meq | EXTENDED_RELEASE_TABLET | Freq: Once | ORAL | Status: AC
  Administered 2012-06-14: 40 meq via ORAL
  Filled 2012-06-14: qty 2

## 2012-06-14 MED ORDER — INSULIN ASPART 100 UNIT/ML ~~LOC~~ SOLN
0.0000 [IU] | SUBCUTANEOUS | Status: DC
  Administered 2012-06-14 (×2): 3 [IU] via SUBCUTANEOUS
  Administered 2012-06-14: 4 [IU] via SUBCUTANEOUS
  Administered 2012-06-15 – 2012-06-22 (×24): 3 [IU] via SUBCUTANEOUS

## 2012-06-14 MED ORDER — CLINIMIX E/DEXTROSE (5/20) 5 % IV SOLN
INTRAVENOUS | Status: AC
  Administered 2012-06-14: 18:00:00 via INTRAVENOUS
  Filled 2012-06-14: qty 2000

## 2012-06-14 NOTE — Progress Notes (Signed)
PARENTERAL NUTRITION CONSULT NOTE - FOLLOW UP  Pharmacy Consult for TNA Indication: bowel rest s/p Whipple procedure  No Known Allergies  Patient Measurements: Height: 5\' 6"  (167.6 cm) Weight: 153 lb 3.5 oz (69.5 kg) IBW/kg (Calculated) : 63.8   Vital Signs: Temp: 98.2 F (36.8 C) (10/26 0557) Temp src: Oral (10/26 0557) BP: 159/78 mmHg (10/26 0557) Pulse Rate: 89  (10/26 0557) Intake/Output from previous day: 10/25 0701 - 10/26 0700 In: 3037.9 [P.O.:597; I.V.:910.9; IV Piggyback:300; TPN:1230] Out: 1385 [Urine:1350; Drains:35] Intake/Output from this shift:    Labs:  [facility] 06/13/12 0430 06/12/12 0435  WBC 12.6* 12.5*  HGB 9.7* 9.8*  HCT 28.4* 27.7*  PLT 205 192  APTT -- --  INR -- --     Basename 06/14/12 0432 06/13/12 0430 06/12/12 1155 06/12/12 0435  NA 127* 124* -- 125*  K 3.4* 3.7 -- 3.6  CL 94* 92* -- 92*  CO2 20 21 -- 21  GLUCOSE 189* 95 -- 111*  BUN 16 16 -- 15  CREATININE 0.82 0.88 -- 0.87  LABCREA -- -- 52.8 --  CREAT24HRUR -- -- -- --  CALCIUM 7.8* 8.0* -- 7.9*  MG -- -- -- 2.0  PHOS -- -- -- 4.2  PROT -- 6.0 -- 5.8*  ALBUMIN -- 2.0* -- 1.8*  AST -- 50* -- 44*  ALT -- 97* -- 91*  ALKPHOS -- 123* -- 116  BILITOT -- 1.5* -- 1.8*  BILIDIR -- -- -- --  IBILI -- -- -- --  PREALBUMIN -- -- -- --  TRIG -- -- -- --  CHOLHDL -- -- -- --  CHOL -- -- -- --   Estimated Creatinine Clearance: 81 ml/min (by C-G formula based on Cr of 0.82).    Basename 06/14/12 0804 06/14/12 0346 06/14/12 0010  GLUCAP 159* 181* 142*   CBGs & Insulin requirements past 24 hours: CBGs 113-181, required 12 units moderate SSI  Nutritional Goals:  Per RD recs 10/20: 1800-2100 Kcal/d and 95-105g protein/d. Clinimix E 5/20 at 80 ml/hr + IVFE 20% at 67ml/hr MWF to provide: 96 g/day protein and 2169 Kcal/day MWF, 1689 Kcal/day STTHS (Avg. 1869 Kcal/day weekly).  Current nutrition:  Diet: Full liquid starting 10/24.  Ensure Completed BID starting 10/25.  TNA: Clinimix  E 5/20 @ goal rate of 80 ml/hr, lipids 20% @ 71ml/hr on MWF only mIVF: NS @ KVO starting 10/25 per MD for hyponatremia   Assessment:  66 YOM found to have malignant stricture of the common bile duct during 9/12 admission. TNA was started inpatient on 9/21 and pt discharged 10/3 on TNA with eventual plan for surgery after nutritional status improved. Pt underwent Whipple procedure and placement of pancreatic stent on 10/18. Pharmacy asked to start TNA on 10/19. Home TNA per [facility] was: over 16hrs daily. 105g protein. No lipids TWThSS: 1439Kcal. With lipids MF: 2651Kcal  for avg. 1785Kcal/d.   10/26: FLD - poor intake, not consuming ordered Ensure Complete  Labs Renal: Scr wnl, stable Hepatic function: Alk phos and AST/ALT slightly elevated Electrolytes:  Na consistently low, K low - MD has ordered replacement (40 meq KCl PO) Pre-Albumin: 20 (10/19), 10.2 (10/21) - falling, but not reflective of full TNA support TG/Cholesterol: wnl (last 10/21) CBGs: > goal of 150  Plan:  At 1800 tonight  Continue TNA with Clinimix E 5/20 @ goal rate of 80 ml/hr; await increased PO intake and further diet advancement to wean  TNA to contain IV fat emulsion, standard multivitamins and trace  elements only on MWF only due to ongoing shortage  Escalate to Resistant SSI   TNA labs Monday/Thursdays  Pharmacy will follow up daily  Gwen Her PharmD  [phone] 06/14/2012 9:41 AM

## 2012-06-14 NOTE — Progress Notes (Signed)
Patient ID: Colin Castro, male   DOB: 09/04/1945, 66 y.o.   MRN: [id number] 8 Days Post-Op  Subjective: Pt with poor po intake but no nausea/vomiting.  Pain improving.  Out of bed with PT.  Small amt of flatus yesterday  Objective: Vital signs in last 24 hours: Temp:  [97.6 F (36.4 C)-98.9 F (37.2 C)] 98.2 F (36.8 C) (10/26 0557) Pulse Rate:  [77-89] 89  (10/26 0557) Resp:  [9-25] 25  (10/26 0814) BP: (106-159)/(55-78) 159/78 mmHg (10/26 0557) SpO2:  [93 %-100 %] 95 % (10/26 0814) FiO2 (%):  [21 %-34 %] 34 % (10/26 0444) Last BM Date: 06/13/12  Intake/Output from previous day: 10/25 0701 - 10/26 0700 In: 3037.9 [P.O.:597; I.V.:910.9; IV Piggyback:300; TPN:1230] Out: 1385 [Urine:1350; Drains:35] Intake/Output this shift:    General appearance: alert, cooperative and no distress, smiling. Head: Normocephalic, without obvious abnormality, atraumatic Resp: no respiratory distress GI: soft, approp tender, distended.  JP's serous-serosang Incision without erythema or drainage.  Staples intact Extremities: extremities normal, atraumatic, no cyanosis or edema  Lab Results:   Basename 06/13/12 0430 06/12/12 0435  WBC 12.6* 12.5*  HGB 9.7* 9.8*  HCT 28.4* 27.7*  PLT 205 192   BMET  Basename 06/14/12 0432 06/13/12 0430  NA 127* 124*  K 3.4* 3.7  CL 94* 92*  CO2 20 21  GLUCOSE 189* 95  BUN 16 16  CREATININE 0.82 0.88  CALCIUM 7.8* 8.0*   PT/INR No results found for this basename: LABPROT:2,INR:2 in the last 72 hours ABG No results found for this basename: PHART:2,PCO2:2,PO2:2,HCO3:2 in the last 72 hours  Studies/Results: No results found.  Anti-infectives: Anti-infectives     Start     Dose/Rate Route Frequency Ordered Stop   06/13/12 2359   vancomycin (VANCOCIN) 1,250 mg in sodium chloride 0.9 % 250 mL IVPB        1,250 mg 166.7 mL/hr over 90 Minutes Intravenous Every 12 hours 06/13/12 2335     06/11/12 1000   vancomycin (VANCOCIN) IVPB 1000 mg/200 mL premix   Status:  Discontinued        1,000 mg 200 mL/hr over 60 Minutes Intravenous Every 12 hours 06/11/12 0924 06/13/12 2334   06/10/12 1000   piperacillin-tazobactam (ZOSYN) IVPB 3.375 g        3.375 g 12.5 mL/hr over 240 Minutes Intravenous Every 8 hours 06/10/12 0848     06/06/12 1800   ertapenem (INVANZ) 1 g in sodium chloride 0.9 % 50 mL IVPB        1 g 100 mL/hr over 30 Minutes Intravenous Every 24 hours 06/06/12 1740 06/06/12 1906   06/05/12 1433   ertapenem (INVANZ) 1 g in sodium chloride 0.9 % 50 mL IVPB        1 g 100 mL/hr over 30 Minutes Intravenous 60 min pre-op 06/05/12 1433 06/06/12 1030          Assessment/Plan: s/p Procedure(s) (LRB) with comments: WHIPPLE PROCEDURE (N/A) - Pancreatico duodenectomy LYSIS OF ADHESION (N/A) CHOLECYSTECTOMY (N/A) PANCREATIC STENT PLACEMENT (N/A) - Removal of biliary stent; Placement of pacreatic stent LYMPH NODE DISSECTION (N/A) - Portal lymph node dissection  Hyponatremia, improved with salt tabs.   Hypokalemia - will replace today.   HTN - on prn hydralazine.  Metoprolol Hyperglycemia - SSI. Pulmonary toilet. Path- ampullary carcinoma T3N1. Will need post op chemo/chemoradiation.    ID - vanc/zosyn for presumed PNA d 3   PT consult for ambulation.   Appears to still have post op ileus-  cont to ambulate, Cont TPN   Nutrition consult for poor PO intake.     LOS: 8 days    Stephen Baruch C. 06/14/2012

## 2012-06-15 ENCOUNTER — Inpatient Hospital Stay (HOSPITAL_COMMUNITY): Payer: Medicaid Other

## 2012-06-15 DIAGNOSIS — K831 Obstruction of bile duct: Secondary | ICD-10-CM

## 2012-06-15 DIAGNOSIS — R4182 Altered mental status, unspecified: Secondary | ICD-10-CM | POA: Diagnosis present

## 2012-06-15 LAB — CBC
HCT: 29.4 % — ABNORMAL LOW (ref 39.0–52.0)
MCH: 27.5 pg (ref 26.0–34.0)
MCV: 80.8 fL (ref 78.0–100.0)
RBC: 3.64 MIL/uL — ABNORMAL LOW (ref 4.22–5.81)
WBC: 14.3 10*3/uL — ABNORMAL HIGH (ref 4.0–10.5)

## 2012-06-15 LAB — GLUCOSE, CAPILLARY
Glucose-Capillary: 108 mg/dL — ABNORMAL HIGH (ref 70–99)
Glucose-Capillary: 121 mg/dL — ABNORMAL HIGH (ref 70–99)
Glucose-Capillary: 129 mg/dL — ABNORMAL HIGH (ref 70–99)
Glucose-Capillary: 134 mg/dL — ABNORMAL HIGH (ref 70–99)
Glucose-Capillary: 135 mg/dL — ABNORMAL HIGH (ref 70–99)

## 2012-06-15 LAB — COMPREHENSIVE METABOLIC PANEL
AST: 41 U/L — ABNORMAL HIGH (ref 0–37)
Albumin: 1.9 g/dL — ABNORMAL LOW (ref 3.5–5.2)
CO2: 17 mEq/L — ABNORMAL LOW (ref 19–32)
Calcium: 7.8 mg/dL — ABNORMAL LOW (ref 8.4–10.5)
Creatinine, Ser: 0.72 mg/dL (ref 0.50–1.35)
GFR calc non Af Amer: >90 mL/min (ref >90)
Sodium: 129 mEq/L — ABNORMAL LOW (ref 135–145)
Total Protein: 5.8 g/dL — ABNORMAL LOW (ref 6.0–8.3)

## 2012-06-15 LAB — PROTIME-INR: Prothrombin Time: 13.3 seconds (ref 11.6–15.2)

## 2012-06-15 LAB — BASIC METABOLIC PANEL
BUN: 14 mg/dL (ref 6–23)
CO2: 19 mEq/L (ref 19–32)
Chloride: 102 mEq/L (ref 96–112)
Creatinine, Ser: 0.72 mg/dL (ref 0.50–1.35)
Glucose, Bld: 113 mg/dL — ABNORMAL HIGH (ref 70–99)

## 2012-06-15 LAB — TROPONIN I: Troponin I: <0.3 ng/mL (ref <0.30)

## 2012-06-15 MED ORDER — CLINIMIX E/DEXTROSE (5/20) 5 % IV SOLN
INTRAVENOUS | Status: AC
  Administered 2012-06-15: 18:00:00 via INTRAVENOUS
  Filled 2012-06-15: qty 2000

## 2012-06-15 MED ORDER — DEXMEDETOMIDINE HCL IN NACL 200 MCG/50ML IV SOLN: 0.2000 ug/kg/h | INTRAVENOUS | Status: DC

## 2012-06-15 MED ORDER — KCL IN DEXTROSE-NACL 10-5-0.45 MEQ/L-%-% IV SOLN
INTRAVENOUS | Status: DC
  Administered 2012-06-15: 20 mL/h via INTRAVENOUS
  Filled 2012-06-15 (×3): qty 1000

## 2012-06-15 MED ORDER — IOHEXOL 350 MG/ML SOLN
100.0000 mL | Freq: Once | INTRAVENOUS | Status: AC | PRN
  Administered 2012-06-15: 100 mL via INTRAVENOUS

## 2012-06-15 MED ORDER — ALTEPLASE 2 MG IJ SOLR
2.0000 mg | Freq: Once | INTRAMUSCULAR | Status: AC
  Administered 2012-06-15: 2 mg
  Filled 2012-06-15: qty 2

## 2012-06-15 MED ORDER — FENTANYL CITRATE 0.05 MG/ML IJ SOLN
25.0000 ug | INTRAMUSCULAR | Status: DC | PRN
  Administered 2012-06-15: 50 ug via INTRAVENOUS
  Administered 2012-06-17: 25 ug via INTRAVENOUS
  Filled 2012-06-15 (×3): qty 2

## 2012-06-15 MED ORDER — DEXMEDETOMIDINE HCL IN NACL 400 MCG/100ML IV SOLN
0.2000 ug/kg/h | INTRAVENOUS | Status: DC
  Administered 2012-06-15: 0.7 ug/kg/h via INTRAVENOUS
  Administered 2012-06-15: 0.2 ug/kg/h via INTRAVENOUS
  Administered 2012-06-16 (×2): 0.7 ug/kg/h via INTRAVENOUS
  Filled 2012-06-15 (×7): qty 100

## 2012-06-15 MED ORDER — DEXMEDETOMIDINE HCL IN NACL 200 MCG/50ML IV SOLN
0.2000 ug/kg/h | INTRAVENOUS | Status: DC
  Filled 2012-06-15: qty 50

## 2012-06-15 NOTE — Progress Notes (Signed)
Report given to Roney Jaffe, RN.  Family taken to waiting room.  Patient currently in CT with rapid response nurse Nehemiah Settle.  .  Patient to be transferred to room 1226.

## 2012-06-15 NOTE — Progress Notes (Signed)
Pt trying to get out of bed.  o2 sats dropped in 70's.  Patient assisted back to bed with o2 at 3l via [place].  o2 sats 97% on 3l via [place].  Pt clammy and eyes rolling back in head. Pt unable to respond for 30 seconds.  Pts eyes then wide open and fixated and unable to follow command.  bp stable.  Family at bedside,  Rapid response called, Dr Carolynne Edouard called and reported above findings and CT of head findings.  Orders given.  Order to transfer to ICU.  Rapid response at bedside and patient placed on monitor per md order.  Notified nursing supervisor.  Plan of care discussed with patient and family.  Patient unable to understand. Family verbalizes understanding.  Iv team in to draw labs without blood return.  Lab called to draw labs.  Ct notified of need for stat CT.  Dr Carolynne Edouard called to confirm CT of abdomen/pelivs with IV contrast and without oral contrast.  Reported to CT.  Awaiting to transfer patient to CT with monitor and rapid response team.

## 2012-06-15 NOTE — Progress Notes (Signed)
PARENTERAL NUTRITION CONSULT NOTE - FOLLOW UP  Pharmacy Consult for TNA Indication: bowel rest s/p Whipple procedure  No Known Allergies  Patient Measurements: Height: 5\' 6"  (167.6 cm) Weight: 146 lb 3.2 oz (66.316 kg) IBW/kg (Calculated) : 63.8   Vital Signs: Temp: 98.1 F (36.7 C) (10/27 0446) Temp src: Oral (10/27 0446) BP: 148/70 mmHg (10/27 0741) Pulse Rate: 84  (10/27 0741) Intake/Output from previous day: 10/26 0701 - 10/27 0700 In: 1669.2 [P.O.:480; I.V.:480; IV Piggyback:349.2; TPN:360] Out: 2070 [Urine:2000; Drains:70] Intake/Output from this shift:    Labs:  [facility] 06/15/12 0606 06/13/12 0430  WBC 14.3* 12.6*  HGB 10.0* 9.7*  HCT 29.4* 28.4*  PLT 243 205  APTT -- --  INR 1.02 --     Basename 06/15/12 0606 06/14/12 0432 06/13/12 0430 06/12/12 1155  NA 132* 127* 124* --  K 3.6 3.4* 3.7 --  CL 102 94* 92* --  CO2 19 20 21  --  GLUCOSE 113* 189* 95 --  BUN 14 16 16  --  CREATININE 0.72 0.82 0.88 --  LABCREA -- -- -- 52.8  CREAT24HRUR -- -- -- --  CALCIUM 7.8* 7.8* 8.0* --  MG -- -- -- --  PHOS -- -- -- --  PROT -- -- 6.0 --  ALBUMIN -- -- 2.0* --  AST -- -- 50* --  ALT -- -- 97* --  ALKPHOS -- -- 123* --  BILITOT -- -- 1.5* --  BILIDIR -- -- -- --  IBILI -- -- -- --  PREALBUMIN -- -- -- --  TRIG -- -- -- --  CHOLHDL -- -- -- --  CHOL -- -- -- --   Estimated Creatinine Clearance: 83.1 ml/min (by C-G formula based on Cr of 0.72).    Basename 06/15/12 0345 06/14/12 2353 06/14/12 2000  GLUCAP 121* 124* 120*   CBGs & Insulin requirements past 24 hours: CBGs 1150, 172, 143, 120, 124, 121 16 units SSI resistant scale   Nutritional Goals:  Per RD recs 10/20: 1800-2100 Kcal/d and 95-105g protein/d. Clinimix E 5/20 at 80 ml/hr + IVFE 20% at 42ml/hr MWF to provide: 96 g/day protein and 2169 Kcal/day MWF, 1689 Kcal/day STTHS (Avg. 1869 Kcal/day weekly).  Current nutrition:  Diet: Full liquid starting 10/24.  Ensure Completed BID started 10/25.    TNA: Clinimix E 5/20 @ goal rate of 80 ml/hr, lipids 20% @ 89ml/hr on MWF only mIVF: NS @ KVO starting 10/25 per MD for hyponatremia   Assessment:  66 YOM found to have malignant stricture of the common bile duct during 9/12 admission. TNA was started inpatient on 9/21 and pt discharged 10/3 on TNA with eventual plan for surgery after nutritional status improved. Pt underwent Whipple procedure and placement of pancreatic stent on 10/18. Pharmacy asked to start TNA on 10/19. Home TNA per [facility] was: over 16hrs daily. 105g protein. No lipids TWThSS: 1439Kcal. With lipids MF: 2651Kcal  for avg. 1785Kcal/d.   10/26: FLD - poor intake, not consuming ordered Ensure Complete  Transferred to ICU 10/27 with AMS, therefore, do not expect diet to advance today  Labs Renal: Scr wnl, stable Hepatic function: Alk phos and AST/ALT slightly elevated (10/25), recheck in am Electrolytes:  Na low but improving with oral suppl and IVF, K also improving on oral suppl Pre-Albumin: 20 (10/19), 10.2 (10/21), recheck in am TG/Cholesterol: wnl (10/21), recheck in am CBGs: now at goal of ~150 with increased SSI  Plan:    Continue TNA with Clinimix E 5/20 @ goal  rate of 80 ml/hr   TNA to contain IV fat emulsion, standard multivitamins and trace elements only on MWF only due to ongoing shortage  TNA labs Monday/Thursdays  Follow up ability to tolerate oral Na/K supplements, may need to adjust IVF if cannot take  Pharmacy will f/u daily  Loralee Pacas, PharmD, BCPS Pager: [phone] 06/15/2012 7:55 AM

## 2012-06-15 NOTE — Progress Notes (Signed)
ANTIBIOTIC CONSULT NOTE - FOLLOW UP  Pharmacy Consult for Vanc, Zosyn Indication: presumed HCAP  No Known Allergies  Patient Measurements: Height: 5\' 6"  (167.6 cm) Weight: 147 lb 7.8 oz (66.9 kg) IBW/kg (Calculated) : 63.8   Vital Signs: Temp: 98.3 F (36.8 C) (10/27 0800) Temp src: Oral (10/27 0800) BP: 148/70 mmHg (10/27 0741) Pulse Rate: 84  (10/27 0741)  Labs:  Basename 06/15/12 0606 06/14/12 0432 06/13/12 0430 06/12/12 1155  WBC 14.3* -- 12.6* --  HGB 10.0* -- 9.7* --  PLT 243 -- 205 --  LABCREA -- -- -- 52.8  CREATININE 0.72 0.82 0.88 --   Estimated Creatinine Clearance: 83.1 ml/min (by C-G formula based on Cr of 0.72).  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  10/22 C-xray with bibasilar peribronchial thickening and patchy opacities, L > R, cannot r/o pneumonia. Surgery started Vanc/Zosyn for presumed HCAP given recent admission and home TNA. Pt now in ICU with AMS, chest CT shows RLL.  Day#6 Zosyn, Day#5 Vancomycin  Afebrile, WBC mildly elevated  Renal function stable with CrCl 83 (N 92).  MRSA PCR negative on 10/15, no other cultures obtained.    Goal of Therapy:  Vancomycin trough level 15-20 mcg/ml Appropriate renal dosing of Zosyn  Plan:   Vancomycin trough prior to 2200 dose tonight to assess dose increase from 10/25  Continue with Zosyn 3.375 gm IV q8h  Follow up renal function & cultures, duration of therapy  Loralee Pacas, PharmD, BCPS Pager: [phone] 06/15/2012 8:43 AM

## 2012-06-15 NOTE — Progress Notes (Signed)
Rapid response called to room 1523 0555 for pt with AMS, low 02 sats, post op whipple procedure and resolved ileus. Per Jacki Cones RN pt has been with AMS since she arrived at 7pm, and was reported to her the same. Pt had a few episode tonight of extreme tremors and eyes rolling in back of head. Dr Carolynne Edouard already called and updated, CT scan of head done at 0330 results already called to Dr Carolynne Edouard. Upon my arrival 0605 pt found resting in bed awake, with tremors but able to follow command to squeeze my hand. Pt unable to state name or answer orientation questions. Moving eratically in bed with son at bedside, attempts to get out of bed at times.  Pupils size 2 responsive sluggish bialteral. Pt placed on RRT monitor. Per Jacki Cones RN pt has not used his Morphine PCA for 12 hrs. CO2 reading at 30-31. Lungs clear, ABD soft with staple incision intact, 2 jps intact with minimal serousanguenous drainage in bulb. 0630 Per Jacki Cones RN awaiting on word from CT to transfer for abd and chest CT Stat. Dr Carolynne Edouard at bedside [id number], updated on pt status. Pt transferred to CT scan 0710 and transferred to ICU room 1226 0730. See VS on flowsheet

## 2012-06-15 NOTE — Progress Notes (Signed)
Dr Carolynne Edouard in to see and evaluate patient.  Awaiting CT scan.  Rapid response at bedside.  VSS.  o2 sat 100, p 71, r 15, bp 152/66. Transported to ct with rapid response and monitor.

## 2012-06-15 NOTE — Progress Notes (Signed)
Pt found with head off bed and trying to get out of bed.  Wife assisting at bedside.  Son resting in chair at bedside.  Bed alarm set and alarming.  Pt assisted back to a laying position in the bed.  Respirations 36 and pt with tremors and unable to follow commands.  Patient with oxygen out of nose.  Replaced oxygen and o2 sat increased from 70-80's to 98-100 on 3l via [place].  Patient with very large loose, green, incontient  bowel movement.  Patient will eyes rolling back and mumbling.  Patient repositioned and resting quietly at this time, respirations returned to 22.  o2 sat 100% on 3l.  Family at beside.  Questioned about mental status norm and baseline of patient prior to surgery and after surgery.  Family states patient has been like this for three days.  Unable to recognize son, does recognize wife.  Reviewed health history and md notes.  Called Dr Carolynne Edouard to discuss plan of care and above assessment.  Order given for CT scan of the head.  Discussed with patient and family concerning plan of care.  Family verbalized understanding. Call light within reach.  Patient transported to CT scan with nurse.

## 2012-06-15 NOTE — Consult Note (Signed)
Name: Colin Castro MRN: [id number] DOB: January 26, 1946    LOS: 9  Referring Provider:  Carolynne Edouard, CCS Reason for Referral:  Altered mental status  PULMONARY / CRITICAL CARE MEDICINE  HPI:  66/M with recent diagnosis of malignant stricture of common bile duct  & gastric outlet obstruction. He initially had biliary drain/ TNA & then underwent whipple's on 10/18 with Lysis of adhesions & cholecystectomy. Path showed - ampullary carcinoma T3N1. Post op course complicated by hyponatremia, post op ileusfevers started on vanc / zosyn 10/23. Altered mental status x 2 days , worse on 10/27 am requiring transfer to ICU -  not used his Morphine PCA for 12 hrs. 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 CT angio neg for PE, Right lower lobe pneumonia & Atherosclerosis Loculated anterior peritoneal fluid collections measuring 1.8 x 11.4 cm in the midline at the level of the umbilicus and 1.6 x 9 cm on the right in the upper abdomen  He does not drink, smokes 1-2 cigs/d He is from [place] , [place] , in [place] x 11 y, does speak some english, worked in a [employer]. His son , from [place]  & wife are at bedside     Past Medical History  Diagnosis Date  . Hypertension   . High cholesterol   . Coronary artery disease   . Common bile duct (CBD) stricture     Malignant   Past Surgical History  Procedure Date  . Ercp 05/07/2012    Procedure: ENDOSCOPIC RETROGRADE CHOLANGIOPANCREATOGRAPHY (ERCP);  Surgeon: [name], MD;  Location: [facility];  Service: Endoscopy;  Laterality: N/A;  Dr [name]  . Eus 05/07/2012    Procedure: UPPER ENDOSCOPIC ULTRASOUND (EUS) RADIAL;  Surgeon: [name], MD;  Location: [facility];  Service: Endoscopy;;  . Appendectomy     open, 25 yrs ago  . Coronary stent placement     2 stents placed  . Whipple procedure 06/06/2012    Procedure: WHIPPLE PROCEDURE;  Surgeon: [name], MD;  Location: [facility] OR;  Service: General;   Laterality: N/A;  Pancreatico duodenectomy  . Cholecystectomy 06/06/2012    Procedure: CHOLECYSTECTOMY;  Surgeon: [name], MD;  Location: [facility];  Service: General;  Laterality: N/A;  . Pancreatic stent placement 06/06/2012    Procedure: PANCREATIC STENT PLACEMENT;  Surgeon: [name], MD;  Location: [facility] OR;  Service: General;  Laterality: N/A;  Removal of biliary stent; Placement of pacreatic stent  . Lymph node dissection 06/06/2012    Procedure: LYMPH NODE DISSECTION;  Surgeon: [name], MD;  Location: [facility] OR;  Service: General;  Laterality: N/A;  Portal lymph node dissection   Prior to Admission medications   Medication Sig Start Date End Date Taking? Authorizing Provider  acetaminophen (TYLENOL) 500 MG tablet Take 500 mg by mouth every 6 (six) hours as needed. pain   Yes Historical Provider, MD  ALPRAZolam (XANAX) 0.25 MG tablet Take 1 tablet (0.25 mg total) by mouth 3 (three) times daily. 05/23/12  Yes [name], MD  lisinopril (PRINIVIL,ZESTRIL) 20 MG tablet Take 20 mg by mouth daily.   Yes Historical Provider, MD  oxyCODONE (OXY IR/ROXICODONE) 5 MG immediate release tablet Take 1 tablet (5 mg total) by mouth every 4 (four) hours as needed. 05/23/12  Yes [name], MD  TPN ADULT Inject 2,500 mLs into the vein See admin instructions. Pt is on an tpn infusion. 1600 mls. Monday and Friday pt gets only lipids. Tuesday,wednesday,thursday,saturday,  Sunday he gets the tpn infusion without lipids. [facility] provides the tpn for the patient.   Yes Historical Provider, MD  Multiple Vitamins-Minerals (CERTA-VITE PO) Take 1 tablet by mouth daily.    Historical Provider, MD   Allergies No Known Allergies  Family History History reviewed. No pertinent family history. Social History  reports that he has been smoking Cigarettes.  He has a 12 pack-year smoking history. He has never used smokeless tobacco. He reports that he does not drink alcohol or use illicit  drugs.    Events Since Admission: 10/27 precedex gtt  Current Status: intermittent agitation  Vital Signs: Temp:  [97.9 F (36.6 C)-100.1 F (37.8 C)] 98.3 F (36.8 C) (10/27 0800) Pulse Rate:  [83-96] 84  (10/27 0741) Resp:  [17-34] 31  (10/27 0741) BP: (126-162)/(63-78) 148/70 mmHg (10/27 0741) SpO2:  [93 %-100 %] 93 % (10/27 0741) Weight:  [66.316 kg (146 lb 3.2 oz)-66.9 kg (147 lb 7.8 oz)] 66.9 kg (147 lb 7.8 oz) (10/27 0741)  Physical Examination: Gen. Thin poorly-nourished, in no distress,anxious affect ENT - no lesions, no post nasal drip Neck: No JVD, no thyromegaly, no carotid bruits Lungs: no use of accessory muscles, no dullness to percussion, clear without rales or rhonchi  Cardiovascular: Rhythm regular, heart sounds  normal, no murmurs, no peripheral edema Abdomen: soft and non-tender, no hepatosplenomegaly, BS normal. Musculoskeletal: No deformities, no cyanosis or clubbing Neuro:  Awake, tremulous, agitated, non focal, increased rigidty wrist Skin:  Warm, no lesions/ rash   Active Problems:  * No active hospital problems. *    ASSESSMENT AND PLAN  NEUROLOGIC  A:  Delerium - unclear etio - non drinker, ?drug reaction, increased sympathetic tone & some cog wheel rigidity, no obvious seizure noted, head CT neg, doubt that this is related to sepsis P:   Precedex, avoid haldol minimise narcotics - fentnayl q 3h prn, dc PCA  PULMONARY No results found for this basename: PHART:5,PCO2:5,PCO2ART:5,PO2ART:5,HCO3:5,O2SAT:5 in the last 168 hours   A:  RLL HCAP P:   Vanc/ zosyn  CARDIOVASCULAR No results found for this basename: TROPONINI:5,LATICACIDVEN:5, O2SATVEN:5,PROBNP:5 in the last 168 hours ECG:  NSR, small qs inf leads Lines:RUE PICC  A: r/o MI P:  chk trops, doubt acute coronary syndrome  RENAL  Lab 06/15/12 0606 06/14/12 0432 06/13/12 0430 06/12/12 0435 06/11/12 0545 06/09/12 0445  NA 132* 127* 124* 125* 124* --  K 3.6 3.4* -- -- -- --   CL 102 94* 92* 92* 89* --  CO2 19 20 21 21 22 --  BUN 14 16 16 15 14 --  CREATININE 0.72 0.82 0.88 0.87 0.80 --  CALCIUM 7.8* 7.8* 8.0* 7.9* 8.1* --  MG -- -- -- 2.0 -- 1.9  PHOS -- -- -- 4.2 -- 2.5   Intake/Output      10 /26 0701 - 10/27 0700 10/27 0701 - 10/28 0700   P.O. 480    I.V. (mL/kg) 480 (7.2)    IV Piggyback 349.2    TPN 360    Total Intake(mL/kg) 1669.2 (25.2)    Urine (mL/kg/hr) 2000 (1.3)    Drains 70    Total Output 2070    Net -400.8         Urine Occurrence 2 x    Stool Occurrence 3 x     Foley:    A:  hyponatremia P:   Improving, avoid hypotonic fluids  GASTROINTESTINAL  Lab 06/13/12 0430 06/12/12 0435 06/11/12 0545 06/10/12 9811 06/09/12 0445  AST 50* 44* 34 46* 71*  ALT 97* 91* 102* 136* 192*  ALKPHOS 123* 116 116 115 102  BILITOT 1.5* 1.8* 1.9* 1.4* 1.5*  PROT 6.0 5.8* 5.8* 5.8* 5.4*  ALBUMIN 2.0* 1.8* 1.9* 2.0* 1.9*    A:  Protein calorie malnutrition S/p whipple's for T3N1 periampullary CA P:   TNA Will need chemo/RT eventually  HEMATOLOGIC  Lab 06/15/12 0606 06/13/12 0430 06/12/12 0435 06/11/12 0545 06/09/12 0445  HGB 10.0* 9.7* 9.8* 10.0* 9.4*  HCT 29.4* 28.4* 27.7* 28.7* 26.8*  PLT 243 205 192 214 156  INR 1.02 -- -- -- --  APTT -- -- -- -- --   A:  Anemia of chronic disease P:   sq lovenox  INFECTIOUS  Lab 06/15/12 0606 06/13/12 0430 06/12/12 0435 06/11/12 0545 06/09/12 0445  WBC 14.3* 12.6* 12.5* 13.4* 12.1*  PROCALCITON -- -- -- -- --   Cultures:  Antibiotics: vanc 10/22 > Zosyn 10/22 >>  A:  Abdominal abscess RLL HCAP P:   Empiric abx chk pct in am Defer to CCS about drainage of abdominal collections  ENDOCRINE  Lab 06/15/12 0751 06/15/12 0345 06/14/12 2353 06/14/12 2000 06/14/12 1740  GLUCAP 108* 121* 124* 120* 143*   A:  No issues   P:   SSI while on TNA    BEST PRACTICE / DISPOSITION DVT Px:  lovenox GI Px:  protonix Social / Family:  Updated family at bedside  WIll ask Dr Algie Coffer who  knows him as outpt to see him tomorrow  Oretha Milch., M.D. Pulmonary and Critical Care Medicine [facility] Pager: [phone]  06/15/2012, 9:20 AM

## 2012-06-15 NOTE — Progress Notes (Signed)
9 Days Post-Op  Subjective: Nurse notes that he is having some mental status changes. At times he will look at you and seem appropriate and at others his eyes roll and he doesn't know where he is or who his family is. He does not complain of pain.  Objective: Vital signs in last 24 hours: Temp:  [97.9 F (36.6 C)-100.1 F (37.8 C)] 98.1 F (36.7 C) (10/27 0446) Pulse Rate:  [83-91] 91  (10/27 0630) Resp:  [17-34] 34  (10/27 0630) BP: (126-162)/(63-78) 136/74 mmHg (10/27 0630) SpO2:  [95 %-100 %] 98 % (10/27 0630) Weight:  [146 lb 3.2 oz (66.316 kg)] 146 lb 3.2 oz (66.316 kg) (10/26 1207) Last BM Date: 06/14/12 (incontient)  Intake/Output from previous day: 10/26 0701 - 10/27 0700 In: 1669.2 [P.O.:480; I.V.:480; IV Piggyback:349.2; TPN:360] Out: 2070 [Urine:2000; Drains:70] Intake/Output this shift: Total I/O In: 589.2 [I.V.:240; IV Piggyback:349.2] Out: 610 [Urine:600; Drains:10]  Resp: clear to auscultation bilaterally Cardio: regular rate and rhythm, S1, S2 normal, no murmur, click, rub or gallop GI: soft, minimal tenderness. good bs and flatus. incision looks good  Lab Results:   Basename 06/15/12 0606 06/13/12 0430  WBC 14.3* 12.6*  HGB 10.0* 9.7*  HCT 29.4* 28.4*  PLT 243 205   BMET  Basename 06/15/12 0606 06/14/12 0432  NA 132* 127*  K 3.6 3.4*  CL 102 94*  CO2 19 20  GLUCOSE 113* 189*  BUN 14 16  CREATININE 0.72 0.82  CALCIUM 7.8* 7.8*   PT/INR  Basename 06/15/12 0606  LABPROT 13.3  INR 1.02   ABG No results found for this basename: PHART:2,PCO2:2,PO2:2,HCO3:2 in the last 72 hours  Studies/Results: Ct Head Wo Contrast  06/15/2012  *RADIOLOGY REPORT*  Clinical Data:  Altered mental status and tremors.  CT HEAD WITHOUT CONTRAST  Technique: Contiguous axial images were obtained from the base of the skull through the vertex without intravenous contrast.  Comparison:   None.  Findings:  Bilateral chronic-appearing subdural fluid collections, measuring 8  mm depth in the right frontal region and 9 mm depth in the left frontal region.  These may represent subdural hygromas versus chronic subdural hematomas.  There is no evidence of any acute intracranial hemorrhage.  There is mild cerebral atrophy.  No midline shift.  Gray-white matter junctions are distinct.  Basal cisterns are not effaced.  No acute intracranial hemorrhage. Vascular calcifications.  No depressed skull fractures.  Visualized paranasal sinuses and mastoid air cells are not opacified.  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  No evidence of acute intracranial hemorrhage or significant mass effect.   Original Report Authenticated By: Marlon Pel, M.D.     Anti-infectives: Anti-infectives     Start     Dose/Rate Route Frequency Ordered Stop   06/13/12 2359   vancomycin (VANCOCIN) 1,250 mg in sodium chloride 0.9 % 250 mL IVPB        1,250 mg 166.7 mL/hr over 90 Minutes Intravenous Every 12 hours 06/13/12 2335     06/11/12 1000   vancomycin (VANCOCIN) IVPB 1000 mg/200 mL premix  Status:  Discontinued        1,000 mg 200 mL/hr over 60 Minutes Intravenous Every 12 hours 06/11/12 0924 06/13/12 2334   06/10/12 1000   piperacillin-tazobactam (ZOSYN) IVPB 3.375 g        3.375 g 12.5 mL/hr over 240 Minutes Intravenous Every 8 hours 06/10/12 0848     06/06/12 1800   ertapenem (INVANZ) 1 g in sodium chloride  0.9 % 50 mL IVPB        1 g 100 mL/hr over 30 Minutes Intravenous Every 24 hours 06/06/12 1740 06/06/12 1906   06/05/12 1433   ertapenem (INVANZ) 1 g in sodium chloride 0.9 % 50 mL IVPB        1 g 100 mL/hr over 30 Minutes Intravenous 60 min pre-op 06/05/12 1433 06/06/12 1030          Assessment/Plan: s/p Procedure(s) (LRB) with comments: WHIPPLE PROCEDURE (N/A) - Pancreatico duodenectomy LYSIS OF ADHESION (N/A) CHOLECYSTECTOMY (N/A) PANCREATIC STENT PLACEMENT (N/A) - Removal of biliary stent; Placement of pacreatic stent LYMPH NODE  DISSECTION (N/A) - Portal lymph node dissection Will plan to transfer to icu Head CT neg Will get CT of chest to rule out PE and abd to rule out abscess. Will plan to get EKG and cardiac enzymes to rule out MI. Will ask CCM to see as well  LOS: 9 days    TOTH III,PAUL S 06/15/2012

## 2012-06-15 NOTE — Progress Notes (Signed)
Subjective:  Confused/Hallucinating. T max 100.1 F  Objective:  Vital Signs in the last 24 hours: Temp:  [98.1 F (36.7 C)-100.1 F (37.8 C)] 98.2 F (36.8 C) (10/27 1100) Pulse Rate:  [83-96] 84  (10/27 0741) Cardiac Rhythm:  [-] Normal sinus rhythm (10/27 0741) Resp:  [17-34] 31  (10/27 0741) BP: (136-162)/(63-78) 148/70 mmHg (10/27 0741) SpO2:  [93 %-100 %] 93 % (10/27 0741) Weight:  [66.9 kg (147 lb 7.8 oz)] 66.9 kg (147 lb 7.8 oz) (10/27 0741)  Physical Exam: BP Readings from Last 1 Encounters:  06/15/12 148/70    Wt Readings from Last 1 Encounters:  06/15/12 66.9 kg (147 lb 7.8 oz)    Weight change:   HEENT: [place]/AT, Eyes-Brown, Conjunctiva-Pale pink, Sclera-Non-icteric Neck: No JVD, No bruit, Trachea midline. Lungs:  Clear, Bilateral. Cardiac:  Regular rhythm, normal S1 and S2, no S3.  Abdomen:  Soft, non-tender. Surgical scar with staples Extremities:  No edema present. No cyanosis. No clubbing. CNS: AxOx0, Cranial nerves grossly intact, moves all 4 extremities. Right handed. Skin: Warm and dry.   Intake/Output from previous day: 10/26 0701 - 10/27 0700 In: 1669.2 [P.O.:480; I.V.:480; IV Piggyback:349.2; TPN:360] Out: 2070 [Urine:2000; Drains:70]    Lab Results: BMET    Component Value Date/Time   NA 132* 06/15/2012 0606   K 3.6 06/15/2012 0606   CL 102 06/15/2012 0606   CO2 19 06/15/2012 0606   GLUCOSE 113* 06/15/2012 0606   BUN 14 06/15/2012 0606   CREATININE 0.72 06/15/2012 0606   CALCIUM 7.8* 06/15/2012 0606   GFRNONAA >90 06/15/2012 0606   GFRAA >90 06/15/2012 0606   CBC    Component Value Date/Time   WBC 14.3* 06/15/2012 0606   RBC 3.64* 06/15/2012 0606   HGB 10.0* 06/15/2012 0606   HCT 29.4* 06/15/2012 0606   PLT 243 06/15/2012 0606   MCV 80.8 06/15/2012 0606   MCH 27.5 06/15/2012 0606   MCHC 34.0 06/15/2012 0606   RDW 15.1 06/15/2012 0606   LYMPHSABS 1.5 06/09/2012 0445   MONOABS 1.5* 06/09/2012 0445   EOSABS 0.2 06/09/2012 0445   BASOSABS 0.0 06/09/2012 0445   CARDIAC ENZYMES Lab Results  Component Value Date   TROPONINI <0.30 06/15/2012    Assessment/Plan:  Patient Active Hospital Problem List: Altered mental state (06/15/2012) R/O hepatic encephalopathy S/P Whipple's for T3N1 periampullary CA  Check LFTs. And NH3 level.   LOS: 9 days    Orpah Cobb  MD  06/15/2012, 1:08 PM

## 2012-06-16 ENCOUNTER — Inpatient Hospital Stay (HOSPITAL_COMMUNITY): Payer: Medicaid Other

## 2012-06-16 ENCOUNTER — Telehealth: Payer: Self-pay | Admitting: Oncology

## 2012-06-16 DIAGNOSIS — R188 Other ascites: Secondary | ICD-10-CM | POA: Diagnosis present

## 2012-06-16 DIAGNOSIS — J189 Pneumonia, unspecified organism: Secondary | ICD-10-CM | POA: Diagnosis present

## 2012-06-16 LAB — DIFFERENTIAL
Basophils Absolute: 0 10*3/uL (ref 0.0–0.1)
Eosinophils Absolute: 0.7 10*3/uL (ref 0.0–0.7)
Eosinophils Relative: 5 % (ref 0–5)
Lymphocytes Relative: 13 % (ref 12–46)
Lymphs Abs: 1.9 10*3/uL (ref 0.7–4.0)
Monocytes Absolute: 1.4 10*3/uL — ABNORMAL HIGH (ref 0.1–1.0)

## 2012-06-16 LAB — CBC
HCT: 29 % — ABNORMAL LOW (ref 39.0–52.0)
MCH: 27.7 pg (ref 26.0–34.0)
MCV: 81.2 fL (ref 78.0–100.0)
Platelets: 238 10*3/uL (ref 150–400)
RDW: 15.1 % (ref 11.5–15.5)

## 2012-06-16 LAB — GLUCOSE, CAPILLARY: Glucose-Capillary: 149 mg/dL — ABNORMAL HIGH (ref 70–99)

## 2012-06-16 LAB — COMPREHENSIVE METABOLIC PANEL
AST: 39 U/L — ABNORMAL HIGH (ref 0–37)
Albumin: 2.1 g/dL — ABNORMAL LOW (ref 3.5–5.2)
Alkaline Phosphatase: 158 U/L — ABNORMAL HIGH (ref 39–117)
BUN: 16 mg/dL (ref 6–23)
Potassium: 3.8 mEq/L (ref 3.5–5.1)
Total Protein: 6.3 g/dL (ref 6.0–8.3)

## 2012-06-16 LAB — CHOLESTEROL, TOTAL: Cholesterol: 112 mg/dL (ref 0–200)

## 2012-06-16 MED ORDER — ZINC TRACE METAL 1 MG/ML IV SOLN
INTRAVENOUS | Status: AC
  Administered 2012-06-16: 17:00:00 via INTRAVENOUS
  Filled 2012-06-16: qty 2000

## 2012-06-16 MED ORDER — SODIUM CHLORIDE 0.9 % IV SOLN
INTRAVENOUS | Status: DC
  Administered 2012-06-16: 19:00:00 via INTRAVENOUS
  Administered 2012-06-17: 50 mL/h via INTRAVENOUS
  Administered 2012-06-17 – 2012-06-23 (×4): via INTRAVENOUS

## 2012-06-16 MED ORDER — MIDAZOLAM HCL 2 MG/2ML IJ SOLN
INTRAMUSCULAR | Status: AC
  Filled 2012-06-16: qty 6

## 2012-06-16 MED ORDER — FENTANYL CITRATE 0.05 MG/ML IJ SOLN
INTRAMUSCULAR | Status: AC | PRN
  Administered 2012-06-16: 25 ug via INTRAVENOUS

## 2012-06-16 MED ORDER — ENOXAPARIN SODIUM 40 MG/0.4ML ~~LOC~~ SOLN
40.0000 mg | SUBCUTANEOUS | Status: DC
  Administered 2012-06-17 – 2012-06-25 (×9): 40 mg via SUBCUTANEOUS
  Filled 2012-06-16 (×9): qty 0.4

## 2012-06-16 MED ORDER — MIDAZOLAM HCL 5 MG/5ML IJ SOLN
INTRAMUSCULAR | Status: AC | PRN
  Administered 2012-06-16: 0.5 mg via INTRAVENOUS

## 2012-06-16 MED ORDER — FENTANYL CITRATE 0.05 MG/ML IJ SOLN
INTRAMUSCULAR | Status: AC
  Filled 2012-06-16: qty 6

## 2012-06-16 MED ORDER — VANCOMYCIN HCL IN DEXTROSE 1-5 GM/200ML-% IV SOLN
1000.0000 mg | Freq: Three times a day (TID) | INTRAVENOUS | Status: DC
  Administered 2012-06-16 – 2012-06-17 (×4): 1000 mg via INTRAVENOUS
  Filled 2012-06-16 (×5): qty 200

## 2012-06-16 MED ORDER — FAT EMULSION 20 % IV EMUL
250.0000 mL | INTRAVENOUS | Status: AC
  Administered 2012-06-16: 250 mL via INTRAVENOUS
  Filled 2012-06-16: qty 250

## 2012-06-16 NOTE — Plan of Care (Signed)
Problem: Phase II Progression Outcomes Goal: Tolerating diet Outcome: Progressing Npo due to AMS

## 2012-06-16 NOTE — Progress Notes (Signed)
PARENTERAL NUTRITION CONSULT NOTE - FOLLOW UP  Pharmacy Consult for TNA Indication: bowel rest s/p Whipple procedure  No Known Allergies  Patient Measurements: Height: 5\' 6"  (167.6 cm) Weight: 147 lb 7.8 oz (66.9 kg) IBW/kg (Calculated) : 63.8   Vital Signs: Temp: 98.3 F (36.8 C) (10/28 0400) Temp src: Oral (10/28 0400) BP: 89/58 mmHg (10/28 0600) Pulse Rate: 25  (10/28 0600) Intake/Output from previous day: 10/27 0701 - 10/28 0700 In: 1907.8 [I.V.:382.5; IV Piggyback:600; TPN:925.3] Out: 1270 [Urine:1250; Drains:20]  Labs:  [facility] 06/16/12 0630 06/15/12 0606  WBC 14.5* 14.3*  HGB 9.9* 10.0*  HCT 29.0* 29.4*  PLT 238 243  APTT -- --  INR -- 1.02     Basename 06/16/12 0630 06/15/12 1334 06/15/12 0606  NA 132* 129* 132*  K 3.8 3.8 3.6  CL 101 101 102  CO2 19 17* 19  GLUCOSE 93 121* 113*  BUN 16 14 14   CREATININE 0.79 0.72 0.72  LABCREA -- -- --  CREAT24HRUR -- -- --  CALCIUM 8.2* 7.8* 7.8*  MG 2.4 -- --  PHOS 4.7* -- --  PROT 6.3 5.8* --  ALBUMIN 2.1* 1.9* --  AST 39* 41* --  ALT 78* 78* --  ALKPHOS 158* 156* --  BILITOT 1.1 1.1 --  BILIDIR -- -- --  IBILI -- -- --  PREALBUMIN -- -- --  TRIG -- -- --  CHOLHDL -- -- --  CHOL -- -- --  10/28 Corrected Ca 9.7  Estimated Creatinine Clearance: 83.1 ml/min (by C-G formula based on Cr of 0.79).    Basename 06/16/12 0734 06/16/12 0425 06/15/12 2334  GLUCAP 131* 133* 129*   CBGs & Insulin requirements past 24 hours: CBGs 108-135 15 units SSI resistant scale   Nutritional Goals:  Per RD recs 10/20: 1800-2100 Kcal/d and 95-105g protein/d. Clinimix E 5/20 at 80 ml/hr + IVFE 20% at 25ml/hr MWF to provide: 96 g/day protein and 2169 Kcal/day MWF, 1689 Kcal/day STTHS (Avg. 1869 Kcal/day weekly).  Current nutrition:  Diet: Full liquid starting 10/24.  Ensure Completed BID started 10/25.  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 TNA was started inpatient on 9/21 and pt discharged 10/3 on TNA with eventual plan for surgery after nutritional status improved. Pt underwent Whipple procedure and placement of pancreatic stent on 10/18. Pharmacy asked to start TNA on 10/19. Home TNA per [facility] was: over 16hrs daily. 105g protein. No lipids TWThSS: 1439Kcal. With lipids MF: 2651Kcal  for avg. 1785Kcal/d.   Transferred to ICU 10/27 with AMS  Was advanced to FLD on 10/26, but changed back to NPO on 10/28.  Plan to place perc drain for fluid collections.  Labs Renal: Scr wnl, stable Hepatic function: Alk phos and AST/ALT slightly elevated (10/28) Electrolytes:  Na low but improving, K also improved (NaCl oral supplement d/c 10/27) Pre-Albumin: 20 (10/19), 10.2 (10/21), in process (10/28) TG/Cholesterol: wnl (10/21), in process (10/28) CBGs: at goal range, cont SSI  Plan:    Continue TNA with Clinimix E 5/20 @ goal rate of 80 ml/hr   TNA to contain IV fat emulsion, standard multivitamins and trace elements only on MWF only due to ongoing shortage  TNA labs Monday/Thursdays  Pharmacy will f/u daily   Lynann Beaver PharmD, BCPS  Pager [phone] 06/16/2012 7:49 AM

## 2012-06-16 NOTE — Procedures (Signed)
CT guided placement of 10 French drain in anterior abdominal fluid collection.  Removed 35 ml of brown cloudy fluid.  No immediate complication.

## 2012-06-16 NOTE — Progress Notes (Signed)
Patient ID: Colin Castro, male   DOB: 04/10/1946, 66 y.o.   MRN: [id number] Request received for CT guided aspiration /possible drainage of anterior loculated peritoneal fluid collection today. Pt s/p recent Whipple for ampullary carcinoma. Additional PMH as below. Exam: pt confused, moaning. Chest- dim BS rt >left bases. Heart- bradycardic, but reg rhythm. Abd- soft, few BS, clean, stapled midline surg incision site.    Filed Vitals:   06/16/12 0200 06/16/12 0400 06/16/12 0600 06/16/12 0800  BP: 97/48 115/46 89/58   Pulse: 60  25   Temp:  98.3 F (36.8 C)  98.3 F (36.8 C)  TempSrc:  Oral  Axillary  Resp: 23 28 24    Height:      Weight:      SpO2: 100% 100% 98%    Past Medical History  Diagnosis Date  . Hypertension   . High cholesterol   . Coronary artery disease   . Common bile duct (CBD) stricture     Malignant   Past Surgical History  Procedure Date  . Ercp 05/07/2012    Procedure: ENDOSCOPIC RETROGRADE CHOLANGIOPANCREATOGRAPHY (ERCP);  Surgeon: [name], MD;  Location: [facility];  Service: Endoscopy;  Laterality: N/A;  Dr [name]  . Eus 05/07/2012    Procedure: UPPER ENDOSCOPIC ULTRASOUND (EUS) RADIAL;  Surgeon: [name], MD;  Location: [facility];  Service: Endoscopy;;  . Appendectomy     open, 25 yrs ago  . Coronary stent placement     2 stents placed  . Whipple procedure 06/06/2012    Procedure: WHIPPLE PROCEDURE;  Surgeon: [name], MD;  Location: [facility] OR;  Service: General;  Laterality: N/A;  Pancreatico duodenectomy  . Cholecystectomy 06/06/2012    Procedure: CHOLECYSTECTOMY;  Surgeon: [name], MD;  Location: [facility] OR;  Service: General;  Laterality: N/A;  . Pancreatic stent placement 06/06/2012    Procedure: PANCREATIC STENT PLACEMENT;  Surgeon: [name], MD;  Location: [facility] OR;  Service: General;  Laterality: N/A;  Removal of biliary stent; Placement of pacreatic stent  . Lymph node dissection 06/06/2012    Procedure: LYMPH NODE DISSECTION;   Surgeon: [name], MD;  Location: [facility] OR;  Service: General;  Laterality: N/A;  Portal lymph node dissection   Dg Chest 1 View  06/10/2012  *RADIOLOGY REPORT*  Clinical Data: Shortness of breath.  Evaluate for infiltrate.  CHEST - 1 VIEW  Comparison: Chest radiograph 06/07/2012 and chest CT 05/09/2012  Findings: Distal tip of right upper extremity PICC projects over the distal superior vena cava. Nasogastric tube can be followed into the stomach and continues below the edge of the image.  Heart, mediastinal, and hilar contours appear stable and within normal limits.  Lung volumes are low bilaterally.  There is peribronchial thickening at both lung bases, and patchy opacities at the lung bases, left greater than right, with obscuration of the  the left costophrenic angle. Aeration of the upper lung fields is slightly improved on these examination.  No definite evidence of pulmonary edema.  Skin staples project over the upper abdomen.  Gas is seen within some upper normal caliber bowel loops in the upper abdomen.  IMPRESSION: 1. Bibasilar peribronchial thickening and patchy opacities, left greater than right. Findings may reflect pneumonia, aspiration, and/or atelectasis. Aeration at the lung bases is without significant change compared to a chest radiograph of 06/06/2012. Aeration of the upper lung fields appears improved.  2.  Cannot exclude a left pleural effusion.   Original Report Authenticated By: Britta Mccreedy, M.D.  Dg Chest 2 View  06/04/2012  *RADIOLOGY REPORT*  Clinical Data: Preoperative respiratory evaluation.  CHEST - 2 VIEW  Comparison: CT chest 05/09/2012  Findings: Two-view exam shows bibasilar interstitial and patchy airspace disease, right greater than left.  The patient had right greater than left airspace disease on the previous CT scan. Cardiopericardial silhouette is at upper limits of normal for size. Right PICC line tip projects at the mid SVC level.  Bones are diffusely  demineralized.  Percutaneous drain overlies the right abdomen but cannot be fully characterized.  IMPRESSION: Right greater than left bibasilar airspace disease.  Comparing to the scout image for the previous chest CT, this appears improved since 05/09/2012.   Original Report Authenticated By: ERIC A. MANSELL, M.D.    Dg Abd 1 View  06/06/2012  *RADIOLOGY REPORT*  Clinical Data: Missing surgical needle  ABDOMEN - 1 VIEW  Comparison: 06/07/2012  Findings: There is a nasogastric tube with tip in the stomach. There are two surgical drainage catheters overlying the right upper lobe.  Skin staples are noted along the midline of the abdomen and right lower quadrant of the abdomen.  The bowel gas pattern is nonobstructed.  No abnormal radiopaque foreign bodies identified.  IMPRESSION:  1. No missing surgical instruments or needles identified.   Original Report Authenticated By: Rosealee Albee, M.D.    Ct Head Wo Contrast  06/15/2012  *RADIOLOGY REPORT*  Clinical Data:  Altered mental status and tremors.  CT HEAD WITHOUT CONTRAST  Technique: Contiguous axial images were obtained from the base of the skull through the vertex without intravenous contrast.  Comparison:   None.  Findings:  Bilateral chronic-appearing subdural fluid collections, measuring 8 mm depth in the right frontal region and 9 mm depth in the left frontal region.  These may represent subdural hygromas versus chronic subdural hematomas.  There is no evidence of any acute intracranial hemorrhage.  There is mild cerebral atrophy.  No midline shift.  Gray-white matter junctions are distinct.  Basal cisterns are not effaced.  No acute intracranial hemorrhage. Vascular calcifications.  No depressed skull fractures.  Visualized paranasal sinuses and mastoid air cells are not opacified.  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  No evidence of acute intracranial hemorrhage or significant mass effect.   Original Report  Authenticated By: Marlon Pel, M.D.    Ct Angio Chest Pe W/cm &/or Wo Cm  06/15/2012  *RADIOLOGY REPORT*  Clinical Data:  Recent Whipple procedure.  Tremors.  CT ANGIOGRAPHY CHEST CT ABDOMEN AND PELVIS WITH CONTRAST  Technique:  Multidetector CT imaging of the chest was performed using the standard protocol during bolus administration of intravenous contrast.  Multiplanar CT image reconstructions including MIPs were obtained to evaluate the vascular anatomy. Multidetector CT imaging of the abdomen and pelvis was performed using the standard protocol during bolus administration of intravenous contrast.  Contrast: OMNIPAQUE IOHEXOL 350 MG/ML SOLN  Comparison:  05/28/2012  CTA CHEST  Findings:  There is good contrast opacification of the pulmonary artery branches.  No discrete filling defect to suggest acute PE.Patient breathing degrades some of the images especially through the lung bases. Adequate contrast opacification of the thoracic aorta with no evidence of dissection, aneurysm, or stenosis. There is classic 3-vessel brachiocephalic arch anatomy. There is patchy atheromatous plaque throughout the thoracic aorta. Patchy coronary calcifications are noted.  Central line extends to the mid SVC.  No pleural or pericardial effusion.  Sub centimeter prevascular, AP window, and precarinal lymph nodes.  No hilar adenopathy.  Progressive dense airspace consolidation in the superior, posterior basal, and medial basal segments of the right lower lobe.  There is some probable dependent atelectasis posteriorly in the left lower lobe.  Emphysematous changes in bilateral upper lobes. Spondylitic changes in the thoracic spine.   Review of the MIP images confirms the above findings.  IMPRESSION: 1. Negative for acute PE or thoracic aortic dissection. 2.  Right lower lobe pneumonia. 3. Atherosclerosis, including . coronary artery disease. Please note that although the presence of coronary artery calcium documents  the presence of coronary artery disease, the severity of this disease and any potential stenosis cannot be assessed on this non-gated CT examination.  Assessment for potential risk factor modification, dietary therapy or pharmacologic therapy may be warranted, if clinically indicated.  CT ABDOMEN AND PELVIS  Findings: Two subhepatic surgical drains are in place.  There is peripheral enhancing loculated fluid in the anterior peritoneal space   measuring 1.8 x 11.4 cm in the midline at the level of the umbilicus and 1.6 x 9 cm on the right in the upper abdomen. There is mild periportal edema.  Portal vein is patent.  No focal liver lesion evident.  Unremarkable spleen, adrenal glands, kidneys. Residual pancreatic body and tail unremarkable.  Postop changes in the stomach and duodenum consistent with a history of Whipple procedure.  The gastric remnant is physiologically distended by fluid.  Small bowel nondilated.  Colon is nondilated.  No free air. 3 cm infrarenal abdominal aortic aneurysm.  Urinary bladder is distended, containing a few gas bubbles suggesting recent instrumentation.  No pelvic, retroperitoneal, or mesenteric adenopathy evident.  Regional bones unremarkable.  Review of the MIP images confirms the above findings.  IMPRESSION:  1.  Loculated anterior peritoneal fluid collections with some peripheral enhancement.  Infection cannot be     excluded.  These would be approachable for percutaneous aspiration if needed. 2.  Postoperative changes as above.   Original Report Authenticated By: Osa Craver, M.D.    Ct Abdomen Pelvis W Contrast  06/15/2012  *RADIOLOGY REPORT*  Clinical Data:  Recent Whipple procedure.  Tremors.  CT ANGIOGRAPHY CHEST CT ABDOMEN AND PELVIS WITH CONTRAST  Technique:  Multidetector CT imaging of the chest was performed using the standard protocol during bolus administration of intravenous contrast.  Multiplanar CT image reconstructions including MIPs were obtained to  evaluate the vascular anatomy. Multidetector CT imaging of the abdomen and pelvis was performed using the standard protocol during bolus administration of intravenous contrast.  Contrast: OMNIPAQUE IOHEXOL 350 MG/ML SOLN  Comparison:  05/28/2012  CTA CHEST  Findings:  There is good contrast opacification of the pulmonary artery branches.  No discrete filling defect to suggest acute PE.Patient breathing degrades some of the images especially through the lung bases. Adequate contrast opacification of the thoracic aorta with no evidence of dissection, aneurysm, or stenosis. There is classic 3-vessel brachiocephalic arch anatomy. There is patchy atheromatous plaque throughout the thoracic aorta. Patchy coronary calcifications are noted.  Central line extends to the mid SVC.  No pleural or pericardial effusion.  Sub centimeter prevascular, AP window, and precarinal lymph nodes.  No hilar adenopathy.  Progressive dense airspace consolidation in the superior, posterior basal, and medial basal segments of the right lower lobe.  There is some probable dependent atelectasis posteriorly in the left lower lobe.  Emphysematous changes in bilateral upper lobes. Spondylitic changes in the thoracic spine.   Review of the MIP images confirms the above  findings.  IMPRESSION: 1. Negative for acute PE or thoracic aortic dissection. 2.  Right lower lobe pneumonia. 3. Atherosclerosis, including . coronary artery disease. Please note that although the presence of coronary artery calcium documents the presence of coronary artery disease, the severity of this disease and any potential stenosis cannot be assessed on this non-gated CT examination.  Assessment for potential risk factor modification, dietary therapy or pharmacologic therapy may be warranted, if clinically indicated.  CT ABDOMEN AND PELVIS  Findings: Two subhepatic surgical drains are in place.  There is peripheral enhancing loculated fluid in the anterior peritoneal space    measuring 1.8 x 11.4 cm in the midline at the level of the umbilicus and 1.6 x 9 cm on the right in the upper abdomen. There is mild periportal edema.  Portal vein is patent.  No focal liver lesion evident.  Unremarkable spleen, adrenal glands, kidneys. Residual pancreatic body and tail unremarkable.  Postop changes in the stomach and duodenum consistent with a history of Whipple procedure.  The gastric remnant is physiologically distended by fluid.  Small bowel nondilated.  Colon is nondilated.  No free air. 3 cm infrarenal abdominal aortic aneurysm.  Urinary bladder is distended, containing a few gas bubbles suggesting recent instrumentation.  No pelvic, retroperitoneal, or mesenteric adenopathy evident.  Regional bones unremarkable.  Review of the MIP images confirms the above findings.  IMPRESSION:  1.  Loculated anterior peritoneal fluid collections with some peripheral enhancement.  Infection cannot be     excluded.  These would be approachable for percutaneous aspiration if needed. 2.  Postoperative changes as above.   Original Report Authenticated By: Osa Craver, M.D.    Ct Abdomen Pelvis W Contrast  05/28/2012  *RADIOLOGY REPORT*  Clinical Data: Severe abdominal pain.  CT ABDOMEN AND PELVIS WITH CONTRAST  Technique:  Multidetector CT imaging of the abdomen and pelvis was performed following the standard protocol during bolus administration of intravenous contrast.  Contrast: OMNIPAQUE IOHEXOL 300 MG/ML  SOLN  Comparison: CT abdomen pelvis 05/01/2012.  Findings: Lung bases show atelectasis in both lower lobes.  Heart size within normal limits.  No pericardial or pleural effusion.  A percutaneous drain courses through the common bile duct, terminating in the duodenum.  There is resultant pneumobilia.  A stone is seen in the fundus of the gallbladder.  Adrenal glands are unremarkable.  Sub centimeter low attenuation lesions in the kidneys are too small to characterize.  Spleen is  unremarkable. Pancreatic duct is dilated, measuring 4 mm.  No pancreatic atrophy. Stomach and bowel are unremarkable.  Small bilateral inguinal hernias contain fat.  Right inguinal hernia contains a small portion of the bladder.  Inguinal lymph nodes are borderline enlarged, measuring up to 10 mm on the left. Atherosclerotic calcification of the arterial vasculature. Infrarenal aorta measures up to 3 cm.  No free fluid.  No worrisome lytic or sclerotic lesions.  IMPRESSION:  1.  Percutaneous biliary drain in place, terminating in the duodenum, with pneumobilia. Together with pancreatic ductal dilatation, an ampullary lesion is questioned. 2.  Cholelithiasis. 3.  Infrarenal aortic aneurysm. 4.  Bilateral inguinal hernias.   Original Report Authenticated By: Reyes Ivan, M.D.    Ir Cholan Exist Tube  05/30/2012  *RADIOLOGY REPORT*  Clinical Data: Status post placement of internal/external biliary drainage catheter on 05/09/2012 to treat biliary obstruction.  The catheter is capped.  Cholangiogram is performed to assess biliary ductal patency prior to planned surgery.  CHOLANGIOGRAM VIA EXISTING  CATHETER  Contrast:  20 ml Omnipaque-300  Fluoro time:  0.1 minutes  Comparison: 05/09/2012  The preexisting biliary drainage catheter was capped, flushed and injected with contrast material.  Cholangiographic imaging was performed.  The catheter was then flushed and recapped.  Findings: Cholangiogram demonstrates patent internal/external biliary drain and bile ducts which are decompressed compared to prior cholangiography.  Injected contrast enters the duodenum and also the cystic duct.  There is evidence of a distal CBD stricture. No extravasation of contrast is identified.  IMPRESSION: Patent biliary drain and bile ducts with persistent evidence of a distal CBD stricture.   Original Report Authenticated By: Reola Calkins, M.D.    Dg Chest Port 1 View  06/06/2012  *RADIOLOGY REPORT*  Clinical Data: Line  placement  PORTABLE CHEST - 1 VIEW  Comparison: 06/03/2012  Findings: Interval placement of a right IJ venous catheter with its tip in the lower SVC.  Stable right PICC with its tip at the cavoatrial junction.  Enteric tube coursing below the diaphragm.  Suspected mild interstitial edema, new.  Bilateral lower lobe opacities, atelectasis versus pneumonia, unchanged.  No pneumothorax.  The heart is normal in size.  IMPRESSION: Interval placement of a right IJ venous catheter with its tip in the lower SVC.  No pneumothorax.  Bilateral lower lobe opacities, atelectasis versus pneumonia, unchanged.  Suspected mild interstitial edema, new.   Original Report Authenticated By: Charline Bills, M.D.   Results for orders placed during the hospital encounter of 06/06/12  PREPARE RBC (CROSSMATCH)      Component Value Range   Order Confirmation ORDER PROCESSED BY BLOOD BANK    BASIC METABOLIC PANEL      Component Value Range   Sodium 131 (*) 135 - 145 mEq/L   Potassium 4.7  3.5 - 5.1 mEq/L   Chloride 99  96 - 112 mEq/L   CO2 20  19 - 32 mEq/L   Glucose, Bld 160 (*) 70 - 99 mg/dL   BUN 24 (*) 6 - 23 mg/dL   Creatinine, Ser 1.61  0.50 - 1.35 mg/dL   Calcium 7.9 (*) 8.4 - 10.5 mg/dL   GFR calc non Af Amer >90  >90 mL/min   GFR calc Af Amer >90  >90 mL/min  CBC      Component Value Range   WBC 14.2 (*) 4.0 - 10.5 K/uL   RBC 4.02 (*) 4.22 - 5.81 MIL/uL   Hemoglobin 11.1 (*) 13.0 - 17.0 g/dL   HCT 09.6 (*) 04.5 - 40.9 %   MCV 81.1  78.0 - 100.0 fL   MCH 27.6  26.0 - 34.0 pg   MCHC 34.0  30.0 - 36.0 g/dL   RDW 81.1  91.4 - 78.2 %   Platelets 172  150 - 400 K/uL  COMPREHENSIVE METABOLIC PANEL      Component Value Range   Sodium 129 (*) 135 - 145 mEq/L   Potassium 4.1  3.5 - 5.1 mEq/L   Chloride 98  96 - 112 mEq/L   CO2 21  19 - 32 mEq/L   Glucose, Bld 153 (*) 70 - 99 mg/dL   BUN 13  6 - 23 mg/dL   Creatinine, Ser 9.56  0.50 - 1.35 mg/dL   Calcium 8.0 (*) 8.4 - 10.5 mg/dL   Total Protein 5.4 (*)  6.0 - 8.3 g/dL   Albumin 2.4 (*) 3.5 - 5.2 g/dL   AST 213 (*) 0 - 37 U/L   ALT 190 (*) 0 -  53 U/L   Alkaline Phosphatase 124 (*) 39 - 117 U/L   Total Bilirubin 2.1 (*) 0.3 - 1.2 mg/dL   GFR calc non Af Amer >90  >90 mL/min   GFR calc Af Amer >90  >90 mL/min  PREALBUMIN      Component Value Range   Prealbumin 20.0  17.0 - 34.0 mg/dL  MAGNESIUM      Component Value Range   Magnesium 1.7  1.5 - 2.5 mg/dL  PHOSPHORUS      Component Value Range   Phosphorus 3.2  2.3 - 4.6 mg/dL  CHOLESTEROL, TOTAL      Component Value Range   Cholesterol 104  0 - 200 mg/dL  TRIGLYCERIDES      Component Value Range   Triglycerides 82  <150 mg/dL  CBC      Component Value Range   WBC 9.9  4.0 - 10.5 K/uL   RBC 4.10 (*) 4.22 - 5.81 MIL/uL   Hemoglobin 11.5 (*) 13.0 - 17.0 g/dL   HCT 40.9 (*) 81.1 - 91.4 %   MCV 80.2  78.0 - 100.0 fL   MCH 28.0  26.0 - 34.0 pg   MCHC 35.0  30.0 - 36.0 g/dL   RDW 78.2  95.6 - 21.3 %   Platelets 157  150 - 400 K/uL  DIFFERENTIAL      Component Value Range   Neutrophils Relative 81 (*) 43 - 77 %   Neutro Abs 8.0 (*) 1.7 - 7.7 K/uL   Lymphocytes Relative 11 (*) 12 - 46 %   Lymphs Abs 1.1  0.7 - 4.0 K/uL   Monocytes Relative 8  3 - 12 %   Monocytes Absolute 0.8  0.1 - 1.0 K/uL   Eosinophils Relative 0  0 - 5 %   Eosinophils Absolute 0.0  0.0 - 0.7 K/uL   Basophils Relative 0  0 - 1 %   Basophils Absolute 0.0  0.0 - 0.1 K/uL  HEMOGLOBIN A1C      Component Value Range   Hemoglobin A1C 6.0 (*) <5.7 %   Mean Plasma Glucose 126 (*) <117 mg/dL  CBC      Component Value Range   WBC 13.4 (*) 4.0 - 10.5 K/uL   RBC 4.04 (*) 4.22 - 5.81 MIL/uL   Hemoglobin 11.2 (*) 13.0 - 17.0 g/dL   HCT 08.6 (*) 57.8 - 46.9 %   MCV 80.7  78.0 - 100.0 fL   MCH 27.7  26.0 - 34.0 pg   MCHC 34.4  30.0 - 36.0 g/dL   RDW 62.9  52.8 - 41.3 %   Platelets 156  150 - 400 K/uL  CREATININE, SERUM      Component Value Range   Creatinine, Ser 0.79  0.50 - 1.35 mg/dL   GFR calc non Af Amer  >90  >90 mL/min   GFR calc Af Amer >90  >90 mL/min  APTT      Component Value Range   aPTT 37  24 - 37 seconds  PROTIME-INR      Component Value Range   Prothrombin Time 14.5  11.6 - 15.2 seconds   INR 1.15  0.00 - 1.49  GLUCOSE, CAPILLARY      Component Value Range   Glucose-Capillary 160 (*) 70 - 99 mg/dL   Comment 1 Notify RN    GLUCOSE, CAPILLARY      Component Value Range   Glucose-Capillary 131 (*) 70 - 99 mg/dL   Comment 1 Notify  RN     Comment 2 Documented in Chart    GLUCOSE, CAPILLARY      Component Value Range   Glucose-Capillary 137 (*) 70 - 99 mg/dL   Comment 1 Notify RN     Comment 2 Documented in Chart    GLUCOSE, CAPILLARY      Component Value Range   Glucose-Capillary 137 (*) 70 - 99 mg/dL   Comment 1 Notify RN     Comment 2 Documented in Chart    GLUCOSE, CAPILLARY      Component Value Range   Glucose-Capillary 140 (*) 70 - 99 mg/dL   Comment 1 Notify RN    GLUCOSE, CAPILLARY      Component Value Range   Glucose-Capillary 119 (*) 70 - 99 mg/dL   Comment 1 Documented in Chart     Comment 2 Notify RN    HEMOGLOBIN AND HEMATOCRIT, BLOOD      Component Value Range   Hemoglobin 11.0 (*) 13.0 - 17.0 g/dL   HCT 16.1 (*) 09.6 - 04.5 %  GLUCOSE, CAPILLARY      Component Value Range   Glucose-Capillary 128 (*) 70 - 99 mg/dL  CBC      Component Value Range   WBC 13.8 (*) 4.0 - 10.5 K/uL   RBC 3.53 (*) 4.22 - 5.81 MIL/uL   Hemoglobin 10.0 (*) 13.0 - 17.0 g/dL   HCT 40.9 (*) 81.1 - 91.4 %   MCV 81.3  78.0 - 100.0 fL   MCH 28.3  26.0 - 34.0 pg   MCHC 34.8  30.0 - 36.0 g/dL   RDW 78.2  95.6 - 21.3 %   Platelets 156  150 - 400 K/uL  COMPREHENSIVE METABOLIC PANEL      Component Value Range   Sodium 126 (*) 135 - 145 mEq/L   Potassium 4.0  3.5 - 5.1 mEq/L   Chloride 94 (*) 96 - 112 mEq/L   CO2 24  19 - 32 mEq/L   Glucose, Bld 122 (*) 70 - 99 mg/dL   BUN 9  6 - 23 mg/dL   Creatinine, Ser 0.86  0.50 - 1.35 mg/dL   Calcium 7.9 (*) 8.4 - 10.5 mg/dL   Total  Protein 5.4 (*) 6.0 - 8.3 g/dL   Albumin 2.1 (*) 3.5 - 5.2 g/dL   AST 578 (*) 0 - 37 U/L   ALT 281 (*) 0 - 53 U/L   Alkaline Phosphatase 103  39 - 117 U/L   Total Bilirubin 1.7 (*) 0.3 - 1.2 mg/dL   GFR calc non Af Amer >90  >90 mL/min   GFR calc Af Amer >90  >90 mL/min  GLUCOSE, CAPILLARY      Component Value Range   Glucose-Capillary 139 (*) 70 - 99 mg/dL   Comment 1 Documented in Chart     Comment 2 Notify RN    GLUCOSE, CAPILLARY      Component Value Range   Glucose-Capillary 139 (*) 70 - 99 mg/dL   Comment 1 Documented in Chart     Comment 2 Notify RN    GLUCOSE, CAPILLARY      Component Value Range   Glucose-Capillary 132 (*) 70 - 99 mg/dL   Comment 1 Documented in Chart     Comment 2 Notify RN    GLUCOSE, CAPILLARY      Component Value Range   Glucose-Capillary 131 (*) 70 - 99 mg/dL   Comment 1 Documented in Chart  Comment 2 Notify RN    GLUCOSE, CAPILLARY      Component Value Range   Glucose-Capillary 123 (*) 70 - 99 mg/dL   Comment 1 Documented in Chart     Comment 2 Notify RN    COMPREHENSIVE METABOLIC PANEL      Component Value Range   Sodium 127 (*) 135 - 145 mEq/L   Potassium 3.2 (*) 3.5 - 5.1 mEq/L   Chloride 93 (*) 96 - 112 mEq/L   CO2 24  19 - 32 mEq/L   Glucose, Bld 127 (*) 70 - 99 mg/dL   BUN 11  6 - 23 mg/dL   Creatinine, Ser 1.47  0.50 - 1.35 mg/dL   Calcium 8.0 (*) 8.4 - 10.5 mg/dL   Total Protein 5.4 (*) 6.0 - 8.3 g/dL   Albumin 1.9 (*) 3.5 - 5.2 g/dL   AST 71 (*) 0 - 37 U/L   ALT 192 (*) 0 - 53 U/L   Alkaline Phosphatase 102  39 - 117 U/L   Total Bilirubin 1.5 (*) 0.3 - 1.2 mg/dL   GFR calc non Af Amer >90  >90 mL/min   GFR calc Af Amer >90  >90 mL/min  MAGNESIUM      Component Value Range   Magnesium 1.9  1.5 - 2.5 mg/dL  PHOSPHORUS      Component Value Range   Phosphorus 2.5  2.3 - 4.6 mg/dL  CBC      Component Value Range   WBC 12.1 (*) 4.0 - 10.5 K/uL   RBC 3.31 (*) 4.22 - 5.81 MIL/uL   Hemoglobin 9.4 (*) 13.0 - 17.0 g/dL    HCT 82.9 (*) 56.2 - 52.0 %   MCV 81.0  78.0 - 100.0 fL   MCH 28.4  26.0 - 34.0 pg   MCHC 35.1  30.0 - 36.0 g/dL   RDW 13.0  86.5 - 78.4 %   Platelets 156  150 - 400 K/uL  DIFFERENTIAL      Component Value Range   Neutrophils Relative 74  43 - 77 %   Neutro Abs 8.9 (*) 1.7 - 7.7 K/uL   Lymphocytes Relative 12  12 - 46 %   Lymphs Abs 1.5  0.7 - 4.0 K/uL   Monocytes Relative 12  3 - 12 %   Monocytes Absolute 1.5 (*) 0.1 - 1.0 K/uL   Eosinophils Relative 1  0 - 5 %   Eosinophils Absolute 0.2  0.0 - 0.7 K/uL   Basophils Relative 0  0 - 1 %   Basophils Absolute 0.0  0.0 - 0.1 K/uL  CHOLESTEROL, TOTAL      Component Value Range   Cholesterol 114  0 - 200 mg/dL  TRIGLYCERIDES      Component Value Range   Triglycerides 111  <150 mg/dL  PREALBUMIN      Component Value Range   Prealbumin 10.2 (*) 17.0 - 34.0 mg/dL  GLUCOSE, CAPILLARY      Component Value Range   Glucose-Capillary 125 (*) 70 - 99 mg/dL   Comment 1 Documented in Chart     Comment 2 Notify RN    GLUCOSE, CAPILLARY      Component Value Range   Glucose-Capillary 126 (*) 70 - 99 mg/dL  GLUCOSE, CAPILLARY      Component Value Range   Glucose-Capillary 123 (*) 70 - 99 mg/dL  GLUCOSE, CAPILLARY      Component Value Range   Glucose-Capillary 131 (*) 70 - 99 mg/dL  GLUCOSE, CAPILLARY      Component Value Range   Glucose-Capillary 135 (*) 70 - 99 mg/dL   Comment 1 Documented in Chart     Comment 2 Notify RN    GLUCOSE, CAPILLARY      Component Value Range   Glucose-Capillary 83  70 - 99 mg/dL   Comment 1 Documented in Chart     Comment 2 Notify RN    POCT I-STAT 7, (LYTES, BLD GAS, ICA,H+H)      Component Value Range   pH, Arterial 7.297 (*) 7.350 - 7.450   pCO2 arterial 44.5  35.0 - 45.0 mmHg   pO2, Arterial 144.0 (*) 80.0 - 100.0 mmHg   Bicarbonate 21.9  20.0 - 24.0 mEq/L   TCO2 23  0 - 100 mmol/L   O2 Saturation 99.0     Acid-base deficit 5.0 (*) 0.0 - 2.0 mmol/L   Sodium 130 (*) 135 - 145 mEq/L   Potassium  6.0 (*) 3.5 - 5.1 mEq/L   Calcium, Ion 1.14  1.13 - 1.30 mmol/L   HCT 35.0 (*) 39.0 - 52.0 %   Hemoglobin 11.9 (*) 13.0 - 17.0 g/dL   Patient temperature 16.1 C     Sample type ARTERIAL    POCT I-STAT 4, (NA,K, GLUC, HGB,HCT)      Component Value Range   Sodium 129 (*) 135 - 145 mEq/L   Potassium 5.8 (*) 3.5 - 5.1 mEq/L   Glucose, Bld 147 (*) 70 - 99 mg/dL   HCT 09.6 (*) 04.5 - 40.9 %   Hemoglobin 11.2 (*) 13.0 - 17.0 g/dL  GLUCOSE, CAPILLARY      Component Value Range   Glucose-Capillary 126 (*) 70 - 99 mg/dL   Comment 1 Notify RN     Comment 2 Documented in Chart    COMPREHENSIVE METABOLIC PANEL      Component Value Range   Sodium 130 (*) 135 - 145 mEq/L   Potassium 3.5  3.5 - 5.1 mEq/L   Chloride 96  96 - 112 mEq/L   CO2 24  19 - 32 mEq/L   Glucose, Bld 131 (*) 70 - 99 mg/dL   BUN 11  6 - 23 mg/dL   Creatinine, Ser 8.11  0.50 - 1.35 mg/dL   Calcium 8.2 (*) 8.4 - 10.5 mg/dL   Total Protein 5.8 (*) 6.0 - 8.3 g/dL   Albumin 2.0 (*) 3.5 - 5.2 g/dL   AST 46 (*) 0 - 37 U/L   ALT 136 (*) 0 - 53 U/L   Alkaline Phosphatase 115  39 - 117 U/L   Total Bilirubin 1.4 (*) 0.3 - 1.2 mg/dL   GFR calc non Af Amer >90  >90 mL/min   GFR calc Af Amer >90  >90 mL/min  GLUCOSE, CAPILLARY      Component Value Range   Glucose-Capillary 114 (*) 70 - 99 mg/dL  GLUCOSE, CAPILLARY      Component Value Range   Glucose-Capillary 125 (*) 70 - 99 mg/dL  GLUCOSE, CAPILLARY      Component Value Range   Glucose-Capillary 124 (*) 70 - 99 mg/dL  GLUCOSE, CAPILLARY      Component Value Range   Glucose-Capillary 141 (*) 70 - 99 mg/dL  URINALYSIS, ROUTINE W REFLEX MICROSCOPIC      Component Value Range   Color, Urine AMBER (*) YELLOW   APPearance CLEAR  CLEAR   Specific Gravity, Urine 1.015  1.005 - 1.030   pH 7.0  5.0 -  8.0   Glucose, UA NEGATIVE  NEGATIVE mg/dL   Hgb urine dipstick NEGATIVE  NEGATIVE   Bilirubin Urine SMALL (*) NEGATIVE   Ketones, ur NEGATIVE  NEGATIVE mg/dL   Protein, ur  NEGATIVE  NEGATIVE mg/dL   Urobilinogen, UA 0.2  0.0 - 1.0 mg/dL   Nitrite NEGATIVE  NEGATIVE   Leukocytes, UA NEGATIVE  NEGATIVE  GLUCOSE, CAPILLARY      Component Value Range   Glucose-Capillary 136 (*) 70 - 99 mg/dL  GLUCOSE, CAPILLARY      Component Value Range   Glucose-Capillary 132 (*) 70 - 99 mg/dL  CBC      Component Value Range   WBC 13.4 (*) 4.0 - 10.5 K/uL   RBC 3.58 (*) 4.22 - 5.81 MIL/uL   Hemoglobin 10.0 (*) 13.0 - 17.0 g/dL   HCT 16.1 (*) 09.6 - 04.5 %   MCV 80.2  78.0 - 100.0 fL   MCH 27.9  26.0 - 34.0 pg   MCHC 34.8  30.0 - 36.0 g/dL   RDW 40.9  81.1 - 91.4 %   Platelets 214  150 - 400 K/uL  COMPREHENSIVE METABOLIC PANEL      Component Value Range   Sodium 124 (*) 135 - 145 mEq/L   Potassium 3.7  3.5 - 5.1 mEq/L   Chloride 89 (*) 96 - 112 mEq/L   CO2 22  19 - 32 mEq/L   Glucose, Bld 112 (*) 70 - 99 mg/dL   BUN 14  6 - 23 mg/dL   Creatinine, Ser 7.82  0.50 - 1.35 mg/dL   Calcium 8.1 (*) 8.4 - 10.5 mg/dL   Total Protein 5.8 (*) 6.0 - 8.3 g/dL   Albumin 1.9 (*) 3.5 - 5.2 g/dL   AST 34  0 - 37 U/L   ALT 102 (*) 0 - 53 U/L   Alkaline Phosphatase 116  39 - 117 U/L   Total Bilirubin 1.9 (*) 0.3 - 1.2 mg/dL   GFR calc non Af Amer >90  >90 mL/min   GFR calc Af Amer >90  >90 mL/min  GLUCOSE, CAPILLARY      Component Value Range   Glucose-Capillary 116 (*) 70 - 99 mg/dL  GLUCOSE, CAPILLARY      Component Value Range   Glucose-Capillary 121 (*) 70 - 99 mg/dL  GLUCOSE, CAPILLARY      Component Value Range   Glucose-Capillary 125 (*) 70 - 99 mg/dL  GLUCOSE, CAPILLARY      Component Value Range   Glucose-Capillary 128 (*) 70 - 99 mg/dL   Comment 1 Notify RN     Comment 2 Documented in Chart    GLUCOSE, CAPILLARY      Component Value Range   Glucose-Capillary 132 (*) 70 - 99 mg/dL   Comment 1 Notify RN     Comment 2 Documented in Chart    GLUCOSE, CAPILLARY      Component Value Range   Glucose-Capillary 141 (*) 70 - 99 mg/dL  COMPREHENSIVE METABOLIC  PANEL      Component Value Range   Sodium 125 (*) 135 - 145 mEq/L   Potassium 3.6  3.5 - 5.1 mEq/L   Chloride 92 (*) 96 - 112 mEq/L   CO2 21  19 - 32 mEq/L   Glucose, Bld 111 (*) 70 - 99 mg/dL   BUN 15  6 - 23 mg/dL   Creatinine, Ser 9.56  0.50 - 1.35 mg/dL   Calcium 7.9 (*) 8.4 - 10.5 mg/dL  Total Protein 5.8 (*) 6.0 - 8.3 g/dL   Albumin 1.8 (*) 3.5 - 5.2 g/dL   AST 44 (*) 0 - 37 U/L   ALT 91 (*) 0 - 53 U/L   Alkaline Phosphatase 116  39 - 117 U/L   Total Bilirubin 1.8 (*) 0.3 - 1.2 mg/dL   GFR calc non Af Amer 89 (*) >90 mL/min   GFR calc Af Amer >90  >90 mL/min  MAGNESIUM      Component Value Range   Magnesium 2.0  1.5 - 2.5 mg/dL  PHOSPHORUS      Component Value Range   Phosphorus 4.2  2.3 - 4.6 mg/dL  CBC      Component Value Range   WBC 12.5 (*) 4.0 - 10.5 K/uL   RBC 3.49 (*) 4.22 - 5.81 MIL/uL   Hemoglobin 9.8 (*) 13.0 - 17.0 g/dL   HCT 16.1 (*) 09.6 - 04.5 %   MCV 79.4  78.0 - 100.0 fL   MCH 28.1  26.0 - 34.0 pg   MCHC 35.4  30.0 - 36.0 g/dL   RDW 40.9  81.1 - 91.4 %   Platelets 192  150 - 400 K/uL  GLUCOSE, CAPILLARY      Component Value Range   Glucose-Capillary 122 (*) 70 - 99 mg/dL   Comment 1 Notify RN    GLUCOSE, CAPILLARY      Component Value Range   Glucose-Capillary 143 (*) 70 - 99 mg/dL   Comment 1 Notify RN    GLUCOSE, CAPILLARY      Component Value Range   Glucose-Capillary 117 (*) 70 - 99 mg/dL   Comment 1 Notify RN    GLUCOSE, CAPILLARY      Component Value Range   Glucose-Capillary 130 (*) 70 - 99 mg/dL   Comment 1 Notify RN     Comment 2 Documented in Chart    CREATININE, URINE, RANDOM      Component Value Range   Creatinine, Urine 52.8    CHLORIDE, URINE, RANDOM      Component Value Range   Chloride Urine 72    SODIUM, URINE, RANDOM      Component Value Range   Sodium, Ur 91    GLUCOSE, CAPILLARY      Component Value Range   Glucose-Capillary 131 (*) 70 - 99 mg/dL   Comment 1 Notify RN     Comment 2 Documented in Chart      GLUCOSE, CAPILLARY      Component Value Range   Glucose-Capillary 129 (*) 70 - 99 mg/dL   Comment 1 Notify RN     Comment 2 Documented in Chart    CBC      Component Value Range   WBC 12.6 (*) 4.0 - 10.5 K/uL   RBC 3.55 (*) 4.22 - 5.81 MIL/uL   Hemoglobin 9.7 (*) 13.0 - 17.0 g/dL   HCT 78.2 (*) 95.6 - 21.3 %   MCV 80.0  78.0 - 100.0 fL   MCH 27.3  26.0 - 34.0 pg   MCHC 34.2  30.0 - 36.0 g/dL   RDW 08.6  57.8 - 46.9 %   Platelets 205  150 - 400 K/uL  COMPREHENSIVE METABOLIC PANEL      Component Value Range   Sodium 124 (*) 135 - 145 mEq/L   Potassium 3.7  3.5 - 5.1 mEq/L   Chloride 92 (*) 96 - 112 mEq/L   CO2 21  19 - 32 mEq/L  Glucose, Bld 95  70 - 99 mg/dL   BUN 16  6 - 23 mg/dL   Creatinine, Ser 1.61  0.50 - 1.35 mg/dL   Calcium 8.0 (*) 8.4 - 10.5 mg/dL   Total Protein 6.0  6.0 - 8.3 g/dL   Albumin 2.0 (*) 3.5 - 5.2 g/dL   AST 50 (*) 0 - 37 U/L   ALT 97 (*) 0 - 53 U/L   Alkaline Phosphatase 123 (*) 39 - 117 U/L   Total Bilirubin 1.5 (*) 0.3 - 1.2 mg/dL   GFR calc non Af Amer 88 (*) >90 mL/min   GFR calc Af Amer >90  >90 mL/min  GLUCOSE, CAPILLARY      Component Value Range   Glucose-Capillary 117 (*) 70 - 99 mg/dL   Comment 1 Notify RN    GLUCOSE, CAPILLARY      Component Value Range   Glucose-Capillary 117 (*) 70 - 99 mg/dL   Comment 1 Notify RN    GLUCOSE, CAPILLARY      Component Value Range   Glucose-Capillary 91  70 - 99 mg/dL   Comment 1 Notify RN    GLUCOSE, CAPILLARY      Component Value Range   Glucose-Capillary 131 (*) 70 - 99 mg/dL  VANCOMYCIN, TROUGH      Component Value Range   Vancomycin Tr 13.6  10.0 - 20.0 ug/mL  GLUCOSE, CAPILLARY      Component Value Range   Glucose-Capillary 137 (*) 70 - 99 mg/dL  GLUCOSE, CAPILLARY      Component Value Range   Glucose-Capillary 120 (*) 70 - 99 mg/dL  BASIC METABOLIC PANEL      Component Value Range   Sodium 127 (*) 135 - 145 mEq/L   Potassium 3.4 (*) 3.5 - 5.1 mEq/L   Chloride 94 (*) 96 - 112  mEq/L   CO2 20  19 - 32 mEq/L   Glucose, Bld 189 (*) 70 - 99 mg/dL   BUN 16  6 - 23 mg/dL   Creatinine, Ser 0.96  0.50 - 1.35 mg/dL   Calcium 7.8 (*) 8.4 - 10.5 mg/dL   GFR calc non Af Amer >90  >90 mL/min   GFR calc Af Amer >90  >90 mL/min  GLUCOSE, CAPILLARY      Component Value Range   Glucose-Capillary 113 (*) 70 - 99 mg/dL  GLUCOSE, CAPILLARY      Component Value Range   Glucose-Capillary 142 (*) 70 - 99 mg/dL  GLUCOSE, CAPILLARY      Component Value Range   Glucose-Capillary 181 (*) 70 - 99 mg/dL  GLUCOSE, CAPILLARY      Component Value Range   Glucose-Capillary 159 (*) 70 - 99 mg/dL  GLUCOSE, CAPILLARY      Component Value Range   Glucose-Capillary 150 (*) 70 - 99 mg/dL  GLUCOSE, CAPILLARY      Component Value Range   Glucose-Capillary 172 (*) 70 - 99 mg/dL  GLUCOSE, CAPILLARY      Component Value Range   Glucose-Capillary 143 (*) 70 - 99 mg/dL   Comment 1 Documented in Chart     Comment 2 Notify RN    GLUCOSE, CAPILLARY      Component Value Range   Glucose-Capillary 120 (*) 70 - 99 mg/dL  GLUCOSE, CAPILLARY      Component Value Range   Glucose-Capillary 124 (*) 70 - 99 mg/dL  GLUCOSE, CAPILLARY      Component Value Range   Glucose-Capillary 121 (*)  70 - 99 mg/dL  CBC      Component Value Range   WBC 14.3 (*) 4.0 - 10.5 K/uL   RBC 3.64 (*) 4.22 - 5.81 MIL/uL   Hemoglobin 10.0 (*) 13.0 - 17.0 g/dL   HCT 16.1 (*) 09.6 - 04.5 %   MCV 80.8  78.0 - 100.0 fL   MCH 27.5  26.0 - 34.0 pg   MCHC 34.0  30.0 - 36.0 g/dL   RDW 40.9  81.1 - 91.4 %   Platelets 243  150 - 400 K/uL  BASIC METABOLIC PANEL      Component Value Range   Sodium 132 (*) 135 - 145 mEq/L   Potassium 3.6  3.5 - 5.1 mEq/L   Chloride 102  96 - 112 mEq/L   CO2 19  19 - 32 mEq/L   Glucose, Bld 113 (*) 70 - 99 mg/dL   BUN 14  6 - 23 mg/dL   Creatinine, Ser 7.82  0.50 - 1.35 mg/dL   Calcium 7.8 (*) 8.4 - 10.5 mg/dL   GFR calc non Af Amer >90  >90 mL/min   GFR calc Af Amer >90  >90 mL/min   PROTIME-INR      Component Value Range   Prothrombin Time 13.3  11.6 - 15.2 seconds   INR 1.02  0.00 - 1.49  VANCOMYCIN, TROUGH      Component Value Range   Vancomycin Tr 13.4  10.0 - 20.0 ug/mL  GLUCOSE, CAPILLARY      Component Value Range   Glucose-Capillary 108 (*) 70 - 99 mg/dL   Comment 1 Documented in Chart     Comment 2 Notify RN    TROPONIN I      Component Value Range   Troponin I <0.30  <0.30 ng/mL  GLUCOSE, CAPILLARY      Component Value Range   Glucose-Capillary 134 (*) 70 - 99 mg/dL  COMPREHENSIVE METABOLIC PANEL      Component Value Range   Sodium 129 (*) 135 - 145 mEq/L   Potassium 3.8  3.5 - 5.1 mEq/L   Chloride 101  96 - 112 mEq/L   CO2 17 (*) 19 - 32 mEq/L   Glucose, Bld 121 (*) 70 - 99 mg/dL   BUN 14  6 - 23 mg/dL   Creatinine, Ser 9.56  0.50 - 1.35 mg/dL   Calcium 7.8 (*) 8.4 - 10.5 mg/dL   Total Protein 5.8 (*) 6.0 - 8.3 g/dL   Albumin 1.9 (*) 3.5 - 5.2 g/dL   AST 41 (*) 0 - 37 U/L   ALT 78 (*) 0 - 53 U/L   Alkaline Phosphatase 156 (*) 39 - 117 U/L   Total Bilirubin 1.1  0.3 - 1.2 mg/dL   GFR calc non Af Amer >90  >90 mL/min   GFR calc Af Amer >90  >90 mL/min  AMMONIA      Component Value Range   Ammonia 35  11 - 60 umol/L  GLUCOSE, CAPILLARY      Component Value Range   Glucose-Capillary 121 (*) 70 - 99 mg/dL   Comment 1 Documented in Chart     Comment 2 Notify RN    COMPREHENSIVE METABOLIC PANEL      Component Value Range   Sodium 132 (*) 135 - 145 mEq/L   Potassium 3.8  3.5 - 5.1 mEq/L   Chloride 101  96 - 112 mEq/L   CO2 19  19 - 32 mEq/L   Glucose, Bld  93  70 - 99 mg/dL   BUN 16  6 - 23 mg/dL   Creatinine, Ser 4.54  0.50 - 1.35 mg/dL   Calcium 8.2 (*) 8.4 - 10.5 mg/dL   Total Protein 6.3  6.0 - 8.3 g/dL   Albumin 2.1 (*) 3.5 - 5.2 g/dL   AST 39 (*) 0 - 37 U/L   ALT 78 (*) 0 - 53 U/L   Alkaline Phosphatase 158 (*) 39 - 117 U/L   Total Bilirubin 1.1  0.3 - 1.2 mg/dL   GFR calc non Af Amer >90  >90 mL/min   GFR calc Af Amer >90   >90 mL/min  MAGNESIUM      Component Value Range   Magnesium 2.4  1.5 - 2.5 mg/dL  PHOSPHORUS      Component Value Range   Phosphorus 4.7 (*) 2.3 - 4.6 mg/dL  CBC      Component Value Range   WBC 14.5 (*) 4.0 - 10.5 K/uL   RBC 3.57 (*) 4.22 - 5.81 MIL/uL   Hemoglobin 9.9 (*) 13.0 - 17.0 g/dL   HCT 09.8 (*) 11.9 - 14.7 %   MCV 81.2  78.0 - 100.0 fL   MCH 27.7  26.0 - 34.0 pg   MCHC 34.1  30.0 - 36.0 g/dL   RDW 82.9  56.2 - 13.0 %   Platelets 238  150 - 400 K/uL  DIFFERENTIAL      Component Value Range   Neutrophils Relative 72  43 - 77 %   Neutro Abs 10.4 (*) 1.7 - 7.7 K/uL   Lymphocytes Relative 13  12 - 46 %   Lymphs Abs 1.9  0.7 - 4.0 K/uL   Monocytes Relative 10  3 - 12 %   Monocytes Absolute 1.4 (*) 0.1 - 1.0 K/uL   Eosinophils Relative 5  0 - 5 %   Eosinophils Absolute 0.7  0.0 - 0.7 K/uL   Basophils Relative 0  0 - 1 %   Basophils Absolute 0.0  0.0 - 0.1 K/uL  CHOLESTEROL, TOTAL      Component Value Range   Cholesterol 112  0 - 200 mg/dL  TRIGLYCERIDES      Component Value Range   Triglycerides 159 (*) <150 mg/dL  GLUCOSE, CAPILLARY      Component Value Range   Glucose-Capillary 135 (*) 70 - 99 mg/dL   Comment 1 Documented in Chart     Comment 2 Notify RN    GLUCOSE, CAPILLARY      Component Value Range   Glucose-Capillary 129 (*) 70 - 99 mg/dL   Comment 1 Documented in Chart     Comment 2 Notify RN    GLUCOSE, CAPILLARY      Component Value Range   Glucose-Capillary 133 (*) 70 - 99 mg/dL   Comment 1 Documented in Chart     Comment 2 Notify RN    GLUCOSE, CAPILLARY      Component Value Range   Glucose-Capillary 131 (*) 70 - 99 mg/dL   Comment 1 Documented in Chart     Comment 2 Notify RN     A/P: Pt with hx ampullary carcinoma, s/p recent Whipple; now with elevated WBC and anterior loculated peritoneal fluid collection. Plan is for CT guided aspiration/possible drainage of collection today. Details/risks of procedure d/w pt's daughter with her  understanding and consent.

## 2012-06-16 NOTE — Progress Notes (Signed)
Subjective:  Little less confused. T max 99.5 F  Objective:  Vital Signs in the last 24 hours: Temp:  [97.7 F (36.5 C)-99.5 F (37.5 C)] 99.5 F (37.5 C) (10/28 1600) Pulse Rate:  [25-109] 57  (10/28 1800) Cardiac Rhythm:  [-] Normal sinus rhythm (10/28 1330) Resp:  [16-28] 22  (10/28 1800) BP: (84-115)/(37-75) 101/43 mmHg (10/28 1800) SpO2:  [95 %-100 %] 98 % (10/28 1800)  Physical Exam: BP Readings from Last 1 Encounters:  06/16/12 101/43    Wt Readings from Last 1 Encounters:  06/15/12 66.9 kg (147 lb 7.8 oz)    Weight change: 0.584 kg (1 lb 4.6 oz)  HEENT: [place]/AT, Eyes-Brown, PERL, EOMI, Conjunctiva-Pale pink, Sclera-Non-icteric Neck: No JVD, No bruit, Trachea midline. Lungs:  Clear, Bilateral. Cardiac:  Regular rhythm, normal S1 and S2, no S3.  Abdomen:  Soft, non-tender. Surgical scar with draines. Extremities:  No edema present. No cyanosis. No clubbing. CNS: AxOx1, Cranial nerves grossly intact, moves all 4 extremities. Right handed. Skin: Warm and dry.   Intake/Output from previous day: 10/27 0701 - 10/28 0700 In: 1999.5 [I.V.:394.2; IV Piggyback:600; TPN:1005.3] Out: 1270 [Urine:1250; Drains:20]    Lab Results: BMET    Component Value Date/Time   NA 132* 06/16/2012 0630   K 3.8 06/16/2012 0630   CL 101 06/16/2012 0630   CO2 19 06/16/2012 0630   GLUCOSE 93 06/16/2012 0630   BUN 16 06/16/2012 0630   CREATININE 0.79 06/16/2012 0630   CALCIUM 8.2* 06/16/2012 0630   GFRNONAA >90 06/16/2012 0630   GFRAA >90 06/16/2012 0630   CBC    Component Value Date/Time   WBC 14.5* 06/16/2012 0630   RBC 3.57* 06/16/2012 0630   HGB 9.9* 06/16/2012 0630   HCT 29.0* 06/16/2012 0630   PLT 238 06/16/2012 0630   MCV 81.2 06/16/2012 0630   MCH 27.7 06/16/2012 0630   MCHC 34.1 06/16/2012 0630   RDW 15.1 06/16/2012 0630   LYMPHSABS 1.9 06/16/2012 0630   MONOABS 1.4* 06/16/2012 0630   EOSABS 0.7 06/16/2012 0630   BASOSABS 0.0 06/16/2012 0630   CARDIAC ENZYMES Lab  Results  Component Value Date   TROPONINI <0.30 06/15/2012    Assessment/Plan:  Patient Active Hospital Problem List: Altered mental state (06/15/2012) Peritoneal fluid collection (06/16/2012) -drained by IR. Pneumonia (06/16/2012) S/P Whipple's for T3N1 periampullary CA.  Continue antibiotic.   LOS: 10 days    Orpah Cobb  MD  06/16/2012, 6:07 PM

## 2012-06-16 NOTE — Telephone Encounter (Signed)
S/W pt dtr pt is still inpatient will call back.

## 2012-06-16 NOTE — Progress Notes (Signed)
Patient ID: Colin Castro, male   DOB: [birth year], 66 y.o.   MRN: [id number] 10 Days Post-Op  Subjective: Denies pain via son but remains delirious.    Objective: Vital signs in last 24 hours: Temp:  [97.7 F (36.5 C)-98.5 F (36.9 C)] 98.3 F (36.8 C) (10/28 0800) Pulse Rate:  [25-66] 25  (10/28 0600) Resp:  [20-31] 24  (10/28 0600) BP: (89-115)/(41-89) 89/58 mmHg (10/28 0600) SpO2:  [94 %-100 %] 98 % (10/28 0600) Last BM Date: 06/15/12  Intake/Output from previous day: 10/27 0701 - 10/28 0700 In: 1907.8 [I.V.:382.5; IV Piggyback:600; TPN:925.3] Out: 1270 [Urine:1250; Drains:20] Intake/Output this shift:    Resp: clear to auscultation bilaterally Cardio: regular rate and rhythm, GI: soft, minimal tenderness. good bs and flatus. incision looks good Drains serosang.    Lab Results:   Basename 06/16/12 0630 06/15/12 0606  WBC 14.5* 14.3*  HGB 9.9* 10.0*  HCT 29.0* 29.4*  PLT 238 243   BMET  Basename 06/16/12 0630 06/15/12 1334  NA 132* 129*  K 3.8 3.8  CL 101 101  CO2 19 17*  GLUCOSE 93 121*  BUN 16 14  CREATININE 0.79 0.72  CALCIUM 8.2* 7.8*   PT/INR  Basename 06/15/12 0606  LABPROT 13.3  INR 1.02   ABG No results found for this basename: PHART:2,PCO2:2,PO2:2,HCO3:2 in the last 72 hours  Studies/Results: Ct Head Wo Contrast  06/15/2012  *RADIOLOGY REPORT*  Clinical Data:  Altered mental status and tremors.  CT HEAD WITHOUT CONTRAST  Technique: Contiguous axial images were obtained from the base of the skull through the vertex without intravenous contrast.  Comparison:   None.  Findings:  Bilateral chronic-appearing subdural fluid collections, measuring 8 mm depth in the right frontal region and 9 mm depth in the left frontal region.  These may represent subdural hygromas versus chronic subdural hematomas.  There is no evidence of any acute intracranial hemorrhage.  There is mild cerebral atrophy.  No midline shift.  Gray-white matter junctions are distinct.   Basal cisterns are not effaced.  No acute intracranial hemorrhage. Vascular calcifications.  No depressed skull fractures.  Visualized paranasal sinuses and mastoid air cells are not opacified.  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  No evidence of acute intracranial hemorrhage or significant mass effect.   Original Report Authenticated By: Marlon Pel, M.D.    Ct Angio Chest Pe W/cm &/or Wo Cm  06/15/2012  *RADIOLOGY REPORT*  Clinical Data:  Recent Whipple procedure.  Tremors.  CT ANGIOGRAPHY CHEST CT ABDOMEN AND PELVIS WITH CONTRAST  Technique:  Multidetector CT imaging of the chest was performed using the standard protocol during bolus administration of intravenous contrast.  Multiplanar CT image reconstructions including MIPs were obtained to evaluate the vascular anatomy. Multidetector CT imaging of the abdomen and pelvis was performed using the standard protocol during bolus administration of intravenous contrast.  Contrast: OMNIPAQUE IOHEXOL 350 MG/ML SOLN  Comparison:  05/28/2012  CTA CHEST  Findings:  There is good contrast opacification of the pulmonary artery branches.  No discrete filling defect to suggest acute PE.Patient breathing degrades some of the images especially through the lung bases. Adequate contrast opacification of the thoracic aorta with no evidence of dissection, aneurysm, or stenosis. There is classic 3-vessel brachiocephalic arch anatomy. There is patchy atheromatous plaque throughout the thoracic aorta. Patchy coronary calcifications are noted.  Central line extends to the mid SVC.  No pleural or pericardial effusion.  Sub centimeter prevascular, AP window, and precarinal lymph  nodes.  No hilar adenopathy.  Progressive dense airspace consolidation in the superior, posterior basal, and medial basal segments of the right lower lobe.  There is some probable dependent atelectasis posteriorly in the left lower lobe.  Emphysematous changes  in bilateral upper lobes. Spondylitic changes in the thoracic spine.   Review of the MIP images confirms the above findings.  IMPRESSION: 1. Negative for acute PE or thoracic aortic dissection. 2.  Right lower lobe pneumonia. 3. Atherosclerosis, including . coronary artery disease. Please note that although the presence of coronary artery calcium documents the presence of coronary artery disease, the severity of this disease and any potential stenosis cannot be assessed on this non-gated CT examination.  Assessment for potential risk factor modification, dietary therapy or pharmacologic therapy may be warranted, if clinically indicated.  CT ABDOMEN AND PELVIS  Findings: Two subhepatic surgical drains are in place.  There is peripheral enhancing loculated fluid in the anterior peritoneal space   measuring 1.8 x 11.4 cm in the midline at the level of the umbilicus and 1.6 x 9 cm on the right in the upper abdomen. There is mild periportal edema.  Portal vein is patent.  No focal liver lesion evident.  Unremarkable spleen, adrenal glands, kidneys. Residual pancreatic body and tail unremarkable.  Postop changes in the stomach and duodenum consistent with a history of Whipple procedure.  The gastric remnant is physiologically distended by fluid.  Small bowel nondilated.  Colon is nondilated.  No free air. 3 cm infrarenal abdominal aortic aneurysm.  Urinary bladder is distended, containing a few gas bubbles suggesting recent instrumentation.  No pelvic, retroperitoneal, or mesenteric adenopathy evident.  Regional bones unremarkable.  Review of the MIP images confirms the above findings.  IMPRESSION:  1.  Loculated anterior peritoneal fluid collections with some peripheral enhancement.  Infection cannot be     excluded.  These would be approachable for percutaneous aspiration if needed. 2.  Postoperative changes as above.   Original Report Authenticated By: Osa Craver, M.D.    Ct Abdomen Pelvis W  Contrast  06/15/2012  *RADIOLOGY REPORT*  Clinical Data:  Recent Whipple procedure.  Tremors.  CT ANGIOGRAPHY CHEST CT ABDOMEN AND PELVIS WITH CONTRAST  Technique:  Multidetector CT imaging of the chest was performed using the standard protocol during bolus administration of intravenous contrast.  Multiplanar CT image reconstructions including MIPs were obtained to evaluate the vascular anatomy. Multidetector CT imaging of the abdomen and pelvis was performed using the standard protocol during bolus administration of intravenous contrast.  Contrast: OMNIPAQUE IOHEXOL 350 MG/ML SOLN  Comparison:  05/28/2012  CTA CHEST  Findings:  There is good contrast opacification of the pulmonary artery branches.  No discrete filling defect to suggest acute PE.Patient breathing degrades some of the images especially through the lung bases. Adequate contrast opacification of the thoracic aorta with no evidence of dissection, aneurysm, or stenosis. There is classic 3-vessel brachiocephalic arch anatomy. There is patchy atheromatous plaque throughout the thoracic aorta. Patchy coronary calcifications are noted.  Central line extends to the mid SVC.  No pleural or pericardial effusion.  Sub centimeter prevascular, AP window, and precarinal lymph nodes.  No hilar adenopathy.  Progressive dense airspace consolidation in the superior, posterior basal, and medial basal segments of the right lower lobe.  There is some probable dependent atelectasis posteriorly in the left lower lobe.  Emphysematous changes in bilateral upper lobes. Spondylitic changes in the thoracic spine.   Review of the MIP images confirms  the above findings.  IMPRESSION: 1. Negative for acute PE or thoracic aortic dissection. 2.  Right lower lobe pneumonia. 3. Atherosclerosis, including . coronary artery disease. Please note that although the presence of coronary artery calcium documents the presence of coronary artery disease, the severity of this disease and  any potential stenosis cannot be assessed on this non-gated CT examination.  Assessment for potential risk factor modification, dietary therapy or pharmacologic therapy may be warranted, if clinically indicated.  CT ABDOMEN AND PELVIS  Findings: Two subhepatic surgical drains are in place.  There is peripheral enhancing loculated fluid in the anterior peritoneal space   measuring 1.8 x 11.4 cm in the midline at the level of the umbilicus and 1.6 x 9 cm on the right in the upper abdomen. There is mild periportal edema.  Portal vein is patent.  No focal liver lesion evident.  Unremarkable spleen, adrenal glands, kidneys. Residual pancreatic body and tail unremarkable.  Postop changes in the stomach and duodenum consistent with a history of Whipple procedure.  The gastric remnant is physiologically distended by fluid.  Small bowel nondilated.  Colon is nondilated.  No free air. 3 cm infrarenal abdominal aortic aneurysm.  Urinary bladder is distended, containing a few gas bubbles suggesting recent instrumentation.  No pelvic, retroperitoneal, or mesenteric adenopathy evident.  Regional bones unremarkable.  Review of the MIP images confirms the above findings.  IMPRESSION:  1.  Loculated anterior peritoneal fluid collections with some peripheral enhancement.  Infection cannot be     excluded.  These would be approachable for percutaneous aspiration if needed. 2.  Postoperative changes as above.   Original Report Authenticated By: Osa Craver, M.D.     Anti-infectives: Anti-infectives     Start     Dose/Rate Route Frequency Ordered Stop   06/16/12 0200   vancomycin (VANCOCIN) IVPB 1000 mg/200 mL premix        1,000 mg 200 mL/hr over 60 Minutes Intravenous Every 8 hours 06/16/12 0137     06/13/12 2359   vancomycin (VANCOCIN) 1,250 mg in sodium chloride 0.9 % 250 mL IVPB  Status:  Discontinued        1,250 mg 166.7 mL/hr over 90 Minutes Intravenous Every 12 hours 06/13/12 2335 06/16/12 0137    06/11/12 1000   vancomycin (VANCOCIN) IVPB 1000 mg/200 mL premix  Status:  Discontinued        1,000 mg 200 mL/hr over 60 Minutes Intravenous Every 12 hours 06/11/12 0924 06/13/12 2334   06/10/12 1000   piperacillin-tazobactam (ZOSYN) IVPB 3.375 g        3.375 g 12.5 mL/hr over 240 Minutes Intravenous Every 8 hours 06/10/12 0848     06/06/12 1800   ertapenem (INVANZ) 1 g in sodium chloride 0.9 % 50 mL IVPB        1 g 100 mL/hr over 30 Minutes Intravenous Every 24 hours 06/06/12 1740 06/06/12 1906   06/05/12 1433   ertapenem (INVANZ) 1 g in sodium chloride 0.9 % 50 mL IVPB        1 g 100 mL/hr over 30 Minutes Intravenous 60 min pre-op 06/05/12 1433 06/06/12 1030          Assessment/Plan: s/p Procedure(s) (LRB) with comments: WHIPPLE PROCEDURE (N/A) - Pancreatico duodenectomy LYSIS OF ADHESION (N/A) CHOLECYSTECTOMY (N/A) PANCREATIC STENT PLACEMENT (N/A) - Removal of biliary stent; Placement of pacreatic stent LYMPH NODE DISSECTION (N/A) - Portal lymph node dissection On dexmetatomidine for delirium per CCM Does have pulmonary infiltrate,  but also has abdominal fluid collection  Given continued delirium and approp antibiotics x 5 days, would perc drain this.  Continue vanc/zosyn Hold lovenox for drain.     LOS: 10 days    Constance Whittle 06/16/2012

## 2012-06-16 NOTE — Progress Notes (Signed)
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 He initially had biliary drain/ TNA & then underwent whipple's on 10/18 with Lysis of adhesions & cholecystectomy. Path showed - ampullary carcinoma T3N1. Post op course complicated by hyponatremia, post op ileusfevers started on vanc / zosyn 10/23. Altered mental status x 2 days , worse on 10/27 am requiring transfer to ICU -  not used his Morphine PCA for 12 hrs. 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 CT angio neg for PE, Right lower lobe pneumonia & Atherosclerosis Loculated anterior peritoneal fluid collections measuring 1.8 x 11.4 cm in the midline at the level of the umbilicus and 1.6 x 9 cm on the right in the upper abdomen  He does not drink, smokes 1-2 cigs/d He is from [place] , [place] , in [place] x 11 y, does speak some english, worked in a [employer]. His son , from [place]  & wife are at bedside   Events Since Admission: 10/27 precedex gtt 10/28 agitation improved, still quite delirius  Current Status: delirius  Vital Signs: Temp:  [97.7 F (36.5 C)-98.5 F (36.9 C)] 98.3 F (36.8 C) (10/28 0800) Pulse Rate:  [25-66] 25  (10/28 0600) Resp:  [20-31] 24  (10/28 0600) BP: (89-115)/(41-89) 89/58 mmHg (10/28 0600) SpO2:  [98 %-100 %] 98 % (10/28 0600)  Physical Examination: Gen: groaning, oriented to name, hospital HEENT: NCAT, PERRL, OP clear PULM: CTA B CV: RRR, no mgr AB: surgical scar without drainage, redness, swelling; two drains in place, BS+,  Ext: warm, no edema Neuro: Groaning stops when you say his name, answers hospital to "where are you", maew   Active Problems:  Biliary obstruction due to malignant neoplasm  Altered mental  state  Peritoneal fluid collection  Pneumonia   ASSESSMENT AND PLAN  NEUROLOGIC  A:  Delirium - unclear etio - non drinker, ?drug reaction, no obvious seizure noted, head CT neg; likely multifactorial but driven by pneumonia and fluid collection P:   Precedex, avoid haldol minimise narcotics - fentnayl q 3h prn, dc PCA D/c oxycodone  PULMONARY No results found for this basename: PHART:5,PCO2:5,PCO2ART:5,PO2ART:5,HCO3:5,O2SAT:5 in the last 168 hours   A:  RLL HCAP P:   Vanc/ zosyn  CARDIOVASCULAR  Lab 06/15/12 1106  TROPONINI <0.30  LATICACIDVEN --  PROBNP --   ECG:  NSR, small qs inf leads Lines:RUE PICC  A: r/o MI P:  chk trops, doubt acute coronary syndrome  RENAL  Lab 06/16/12 0630 06/15/12 1334 06/15/12 0606 06/14/12 0432 06/13/12 0430 06/12/12 0435  NA 132* 129* 132* 127* 124* --  K 3.8 3.8 -- -- -- --  CL 101 101 102 94* 92* --  CO2 19 17* 19 20 21  --  BUN 16 14 14 16 16  --  CREATININE 0.79 0.72 0.72 0.82 0.88 --  CALCIUM 8.2* 7.8* 7.8* 7.8* 8.0* --  MG 2.4 -- -- -- -- 2.0  PHOS 4.7* -- -- -- -- 4.2   Intake/Output      10/27 0701 - 10/28 0700 10/28 0701 - 10/29 0700   P.O.     I.V. (mL/kg) 382.5 (5.7)    IV Piggyback 600    TPN 925.3    Total Intake(mL/kg) 1907.8 (28.5)  Urine (mL/kg/hr) 1250 (0.8)    Drains 20    Total Output 1270    Net +637.8         Urine Occurrence 1 x    Stool Occurrence 1 x     Foley:    A:  hyponatremia P:   Improving, avoid hypotonic fluids  GASTROINTESTINAL  Lab 06/16/12 0630 06/15/12 1334 06/13/12 0430 06/12/12 0435 06/11/12 0545  AST 39* 41* 50* 44* 34  ALT 78* 78* 97* 91* 102*  ALKPHOS 158* 156* 123* 116 116  BILITOT 1.1 1.1 1.5* 1.8* 1.9*  PROT 6.3 5.8* 6.0 5.8* 5.8*  ALBUMIN 2.1* 1.9* 2.0* 1.8* 1.9*    A:  Protein calorie malnutrition S/p whipple's for T3N1 periampullary CA P:   TNA Will need chemo/RT eventually  HEMATOLOGIC  Lab 06/16/12 0630 06/15/12 0606 06/13/12 0430 06/12/12 0435  06/11/12 0545  HGB 9.9* 10.0* 9.7* 9.8* 10.0*  HCT 29.0* 29.4* 28.4* 27.7* 28.7*  PLT 238 243 205 192 214  INR -- 1.02 -- -- --  APTT -- -- -- -- --   A:  Anemia of chronic disease P:   sq lovenox  INFECTIOUS  Lab 06/16/12 0630 06/15/12 0606 06/13/12 0430 06/12/12 0435 06/11/12 0545  WBC 14.5* 14.3* 12.6* 12.5* 13.4*  PROCALCITON -- -- -- -- --   Cultures:  Antibiotics: vanc 10/22 > Zosyn 10/22 >>  A:  Abdominal abscess RLL HCAP P:   Empiric abx Plan to drain abdominal fluid collection today  ENDOCRINE  Lab 06/16/12 0734 06/16/12 0425 06/15/12 2334 06/15/12 1945 06/15/12 1555  GLUCAP 131* 133* 129* 135* 121*   A:  No issues   P:   SSI while on TNA   BEST PRACTICE / DISPOSITION DVT Px:  lovenox GI Px:  protonix Social / Family:  Updated family at bedside with Dr. Lynden Ang PCCM Pager: [phone] Cell: [phone] If no response, call [phone]  06/16/2012, 9:33 AM

## 2012-06-16 NOTE — Progress Notes (Addendum)
[id number] Earlene Plater, RN, BSN, CCM: CHART REVIEWED AND UPDATED.  Patient transferred to icu due to ams and requiring Precedex drip for sedation, pt is also presenting with early sepsis. NO DISCHARGE NEEDS PRESENT AT THIS TIME. CASE MANAGEMENT [phone]

## 2012-06-16 NOTE — Progress Notes (Signed)
ANTIBIOTIC CONSULT NOTE - FOLLOW UP  Pharmacy Consult for Vanc, Zosyn Indication: presumed HCAP  No Known Allergies  Patient Measurements: Height: 5\' 6"  (167.6 cm) Weight: 147 lb 7.8 oz (66.9 kg) IBW/kg (Calculated) : 63.8   Vital Signs: Temp: 97.7 F (36.5 C) (10/28 0000) Temp src: Oral (10/28 0000) BP: 93/55 mmHg (10/27 2300) Pulse Rate: 66  (10/27 2000)  Labs:  Basename 06/15/12 1334 06/15/12 0606 06/14/12 0432 06/13/12 0430  WBC -- 14.3* -- 12.6*  HGB -- 10.0* -- 9.7*  PLT -- 243 -- 205  LABCREA -- -- -- --  CREATININE 0.72 0.72 0.82 --   Estimated Creatinine Clearance: 83.1 ml/min (by C-G formula based on Cr of 0.72).  Dose changes/drug level info:  10/25 VT = 13.6 on 1g q12h - Inc to 1250mg  q12h 10/27 VT = 13.4 on 1250mg  q12h  Assessment: 66 yom with malignant stricture of the common bile duct s/p Whipple procedure and placement of pancreatic stent on 10/18.  10/22 C-xray with bibasilar peribronchial thickening and patchy opacities, L > R, cannot r/o pneumonia. Surgery started Vanc/Zosyn for presumed HCAP given recent admission and home TNA. Pt now in ICU with AMS, chest CT shows RLL.  Day#7 Zosyn, Day#6 Vancomycin  Afebrile, WBC mildly elevated  Renal function stable with CrCl 83 (N 92).  MRSA PCR negative on 10/15, no other cultures obtained.   Vancomycin trough remains subtherapeutic on q12h regimen despite dose increase  Goal of Therapy:  Vancomycin trough level 15-20 mcg/ml Appropriate renal dosing of Zosyn  Plan:   Change Vancomycin to 1000mg  IV q8h  Continue with Zosyn 3.375 gm IV q8h  Follow up renal function & cultures, duration of therapy  Terrilee Files, PharmD 06/16/2012 1:34 AM

## 2012-06-16 NOTE — Progress Notes (Signed)
PT Cancellation Note  Patient Details Name: Colin Castro MRN: [id number] DOB: 07-Feb-1946   Cancelled Treatment:    Reason Eval/Treat Not Completed: Medical issues which prohibited therapy (pt going to IR for drain placement; also very confused/delirious per RN)   Drucilla Chalet 06/16/2012, 9:38 AM

## 2012-06-17 LAB — GLUCOSE, CAPILLARY
Glucose-Capillary: 112 mg/dL — ABNORMAL HIGH (ref 70–99)
Glucose-Capillary: 123 mg/dL — ABNORMAL HIGH (ref 70–99)
Glucose-Capillary: 138 mg/dL — ABNORMAL HIGH (ref 70–99)
Glucose-Capillary: 139 mg/dL — ABNORMAL HIGH (ref 70–99)
Glucose-Capillary: 144 mg/dL — ABNORMAL HIGH (ref 70–99)

## 2012-06-17 LAB — COMPREHENSIVE METABOLIC PANEL
AST: 30 U/L (ref 0–37)
Albumin: 1.8 g/dL — ABNORMAL LOW (ref 3.5–5.2)
BUN: 15 mg/dL (ref 6–23)
Calcium: 7.8 mg/dL — ABNORMAL LOW (ref 8.4–10.5)
Creatinine, Ser: 0.78 mg/dL (ref 0.50–1.35)
GFR calc non Af Amer: >90 mL/min (ref >90)
Total Bilirubin: 0.9 mg/dL (ref 0.3–1.2)

## 2012-06-17 LAB — CBC
Hemoglobin: 9.4 g/dL — ABNORMAL LOW (ref 13.0–17.0)
MCH: 27.5 pg (ref 26.0–34.0)
MCV: 81.9 fL (ref 78.0–100.0)
Platelets: 253 10*3/uL (ref 150–400)
RBC: 3.42 MIL/uL — ABNORMAL LOW (ref 4.22–5.81)
WBC: 9.9 10*3/uL (ref 4.0–10.5)

## 2012-06-17 LAB — VITAMIN B12: Vitamin B-12: 601 pg/mL (ref 211–911)

## 2012-06-17 LAB — AMYLASE, BODY FLUID
Amylase, Fluid: 11 U/L
Amylase, Fluid: 12 U/L

## 2012-06-17 MED ORDER — CLINIMIX E/DEXTROSE (5/20) 5 % IV SOLN
INTRAVENOUS | Status: AC
  Administered 2012-06-17: 18:00:00 via INTRAVENOUS
  Filled 2012-06-17: qty 2000

## 2012-06-17 MED ORDER — FENTANYL CITRATE 0.05 MG/ML IJ SOLN
25.0000 ug | INTRAMUSCULAR | Status: DC | PRN
  Administered 2012-06-17 (×5): 25 ug via INTRAVENOUS
  Administered 2012-06-17 – 2012-06-18 (×2): 50 ug via INTRAVENOUS
  Administered 2012-06-18 (×4): 25 ug via INTRAVENOUS
  Administered 2012-06-19 – 2012-06-24 (×3): 50 ug via INTRAVENOUS
  Administered 2012-06-25: 25 ug via INTRAVENOUS
  Filled 2012-06-17 (×13): qty 2

## 2012-06-17 MED ORDER — QUETIAPINE FUMARATE 50 MG PO TABS
50.0000 mg | ORAL_TABLET | Freq: Every evening | ORAL | Status: DC | PRN
  Filled 2012-06-17: qty 1

## 2012-06-17 NOTE — Progress Notes (Signed)
Physical Therapy Treatment Patient Details Name: Colin Castro MRN: [id number] DOB: December 02, 1945 Today's Date: 06/17/2012 Time: 1191-4782 PT Time Calculation (min): 25 min  PT Assessment / Plan / Recommendation Comments on Treatment Session  pt. having some abd. discomfort. Appears more oriented today and able to follow commands and participate. Fpllowed most english conversation/commands. Son is present to interpret as needed.Plans for DC home    Follow Up Recommendations  Home health PT;Supervision/Assistance - 24 hour     Does the patient have the potential to tolerate intense rehabilitation     Barriers to Discharge        Equipment Recommendations  Rolling walker with 5" wheels    Recommendations for Other Services OT consult  Frequency Min 3X/week   Plan Discharge plan remains appropriate;Frequency remains appropriate    Precautions / Restrictions Precautions Precautions: Fall Precaution Comments: abdominal surgery, drains (2) R side    Pertinent Vitals/Pain  RR 34,sats > 96 BP post 137/53 pain of abdomen, instructed in pillow splinting for coughing.    Mobility  Bed Mobility Supine to Sit: 3: Mod assist;With rails Details for Bed Mobility Assistance: VC to roll to protect abdomen Transfers Sit to Stand: 4: Min assist;From bed;With upper extremity assist Stand to Sit: 4: Min guard;With upper extremity assist;With armrests Details for Transfer Assistance: Pt. is shakey upon standing. Ambulation/Gait Ambulation/Gait Assistance: 3: Mod assist Ambulation Distance (Feet): 5 Feet Assistive device: 2 person hand held assist    Exercises     PT Diagnosis:    PT Problem List:   PT Treatment Interventions:     PT Goals Acute Rehab PT Goals Pt will go Supine/Side to Sit: with supervision PT Goal: Supine/Side to Sit - Progress: Goal set today Pt will go Sit to Stand: with supervision PT Goal: Sit to Stand - Progress: Progressing toward goal Pt will Ambulate: >150  feet;with supervision;with least restrictive assistive device PT Goal: Ambulate - Progress: Progressing toward goal  Visit Information  Last PT Received On: 06/17/12 Assistance Needed: +2 (pt is weaker and has been confused.)    Subjective Data  Subjective: I hurt a little   Cognition  Arousal/Alertness: Awake/alert Orientation Level: Time Behavior During Session: [facility] for tasks performed    Balance     End of Session PT - End of Session Activity Tolerance: Patient limited by fatigue;Patient limited by pain Patient left: in chair;with call bell/phone within reach;with family/visitor present Nurse Communication: Mobility status   GP     Rada Hay 06/17/2012, 11:06 AM [phone]

## 2012-06-17 NOTE — Progress Notes (Signed)
Patient ID: Colin Castro, male   DOB: Nov 26, 1945, 66 y.o.   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  Got drain yesterday.    Objective: Vital signs in last 24 hours: Temp:  [97.4 F (36.3 C)-99.5 F (37.5 C)] 99.1 F (37.3 C) (10/29 0000) Pulse Rate:  [25-109] 58  (10/29 0200) Resp:  [16-28] 23  (10/29 0200) BP: (84-116)/(37-75) 110/45 mmHg (10/29 0200) SpO2:  [95 %-100 %] 96 % (10/29 0200) Last BM Date: 06/16/12  Intake/Output from previous day: 10/28 0701 - 10/29 0700 In: 2844 [I.V.:409; IV Piggyback:735; TPN:1700] Out: 1905 [Urine:1850; Drains:55] Intake/Output this shift: Total I/O In: 1193.6 [I.V.:213.6; IV Piggyback:260; TPN:720] Out: 525 [Urine:525]  Resp: clear to auscultation bilaterally Cardio: regular rate and rhythm, GI: soft, minimal tenderness. good bs and flatus. incision looks good Drain2 serosang.  Other 2 drains brown/murky.    Lab Results:   Basename 06/16/12 0630 06/15/12 0606  WBC 14.5* 14.3*  HGB 9.9* 10.0*  HCT 29.0* 29.4*  PLT 238 243   BMET  Basename 06/16/12 0630 06/15/12 1334  NA 132* 129*  K 3.8 3.8  CL 101 101  CO2 19 17*  GLUCOSE 93 121*  BUN 16 14  CREATININE 0.79 0.72  CALCIUM 8.2* 7.8*   PT/INR  Basename 06/15/12 0606  LABPROT 13.3  INR 1.02   ABG No results found for this basename: PHART:2,PCO2:2,PO2:2,HCO3:2 in the last 72 hours  Studies/Results: Ct Angio Chest Pe W/cm &/or Wo Cm  06/15/2012  *RADIOLOGY REPORT*  Clinical Data:  Recent Whipple procedure.  Tremors.  CT ANGIOGRAPHY CHEST CT ABDOMEN AND PELVIS WITH CONTRAST  Technique:  Multidetector CT imaging of the chest was performed using the standard protocol during bolus administration of intravenous contrast.  Multiplanar CT image reconstructions including MIPs were obtained to evaluate the vascular anatomy. Multidetector CT imaging of the abdomen and pelvis was performed using  the standard protocol during bolus administration of intravenous contrast.  Contrast: OMNIPAQUE IOHEXOL 350 MG/ML SOLN  Comparison:  05/28/2012  CTA CHEST  Findings:  There is good contrast opacification of the pulmonary artery branches.  No discrete filling defect to suggest acute PE.Patient breathing degrades some of the images especially through the lung bases. Adequate contrast opacification of the thoracic aorta with no evidence of dissection, aneurysm, or stenosis. There is classic 3-vessel brachiocephalic arch anatomy. There is patchy atheromatous plaque throughout the thoracic aorta. Patchy coronary calcifications are noted.  Central line extends to the mid SVC.  No pleural or pericardial effusion.  Sub centimeter prevascular, AP window, and precarinal lymph nodes.  No hilar adenopathy.  Progressive dense airspace consolidation in the superior, posterior basal, and medial basal segments of the right lower lobe.  There is some probable dependent atelectasis posteriorly in the left lower lobe.  Emphysematous changes in bilateral upper lobes. Spondylitic changes in the thoracic spine.   Review of the MIP images confirms the above findings.  IMPRESSION: 1. Negative for acute PE or thoracic aortic dissection. 2.  Right lower lobe pneumonia. 3. Atherosclerosis, including . coronary artery disease. Please note that although the presence of coronary artery calcium documents the presence of coronary artery disease, the severity of this disease and any potential stenosis cannot be assessed on this non-gated CT examination.  Assessment for potential risk factor modification, dietary therapy or pharmacologic therapy may be warranted, if clinically indicated.  CT ABDOMEN AND PELVIS  Findings: Two subhepatic surgical drains  are in place.  There is peripheral enhancing loculated fluid in the anterior peritoneal space   measuring 1.8 x 11.4 cm in the midline at the level of the umbilicus and 1.6 x 9 cm on the right in  the upper abdomen. There is mild periportal edema.  Portal vein is patent.  No focal liver lesion evident.  Unremarkable spleen, adrenal glands, kidneys. Residual pancreatic body and tail unremarkable.  Postop changes in the stomach and duodenum consistent with a history of Whipple procedure.  The gastric remnant is physiologically distended by fluid.  Small bowel nondilated.  Colon is nondilated.  No free air. 3 cm infrarenal abdominal aortic aneurysm.  Urinary bladder is distended, containing a few gas bubbles suggesting recent instrumentation.  No pelvic, retroperitoneal, or mesenteric adenopathy evident.  Regional bones unremarkable.  Review of the MIP images confirms the above findings.  IMPRESSION:  1.  Loculated anterior peritoneal fluid collections with some peripheral enhancement.  Infection cannot be     excluded.  These would be approachable for percutaneous aspiration if needed. 2.  Postoperative changes as above.   Original Report Authenticated By: Osa Craver, M.D.    Ct Guided Abscess Drain  06/16/2012  *RADIOLOGY REPORT*  Clinical history:Post Whipple procedure.  Suspicious fluid collection in the anterior abdominal cavity.  PROCEDURE(S): CT GUIDED DRAIN PLACEMENT IN ANTERIOR ABDOMINAL FLUID COLLECTION  Physician: Rachelle Hora. Henn, MD  Medications:Versed 0.5 mg, Fentanyl 25 mcg. A radiology nurse monitored the patient for moderate sedation.  Moderate sedation time:26 minutes  Procedure:Informed consent was obtained for a percutaneous drain placement.  The patient was placed supine on the CT scanner. Images through the abdomen were obtained.  Fluid collection in the anterior abdominal cavity was identified.  The skin just left of the midline staples was prepped and draped in a sterile fashion. Skin was anesthetized with lidocaine.  A 5-French Yueh catheter was directed into the collection with CT guidance.   Cloudy brown fluid was aspirated.  An Amplatz wire did not easily tract within the  collection.  The Amplatz wire was exchanged for a Bentson wire. The tract was dilated and a 10-French multipurpose drain was placed within the collection.  35 ml of cloudy brown fluid was aspirated. Catheter was sutured to the skin and the catheter was attached to a suction bulb.  Findings:Fluid collection within the anterior abdominal cavity.  At the end of the procedure, majority of fluid collection was decompressed.  Complications: None  Impression:CT guided drain placement within the anterior abdominal fluid collection.  Sample sent for Gram stain and culture.   Original Report Authenticated By: Richarda Overlie, M.D.    Ct Abdomen Pelvis W Contrast  06/15/2012  *RADIOLOGY REPORT*  Clinical Data:  Recent Whipple procedure.  Tremors.  CT ANGIOGRAPHY CHEST CT ABDOMEN AND PELVIS WITH CONTRAST  Technique:  Multidetector CT imaging of the chest was performed using the standard protocol during bolus administration of intravenous contrast.  Multiplanar CT image reconstructions including MIPs were obtained to evaluate the vascular anatomy. Multidetector CT imaging of the abdomen and pelvis was performed using the standard protocol during bolus administration of intravenous contrast.  Contrast: OMNIPAQUE IOHEXOL 350 MG/ML SOLN  Comparison:  05/28/2012  CTA CHEST  Findings:  There is good contrast opacification of the pulmonary artery branches.  No discrete filling defect to suggest acute PE.Patient breathing degrades some of the images especially through the lung bases. Adequate contrast opacification of the thoracic aorta with no evidence of dissection,  aneurysm, or stenosis. There is classic 3-vessel brachiocephalic arch anatomy. There is patchy atheromatous plaque throughout the thoracic aorta. Patchy coronary calcifications are noted.  Central line extends to the mid SVC.  No pleural or pericardial effusion.  Sub centimeter prevascular, AP window, and precarinal lymph nodes.  No hilar adenopathy.  Progressive  dense airspace consolidation in the superior, posterior basal, and medial basal segments of the right lower lobe.  There is some probable dependent atelectasis posteriorly in the left lower lobe.  Emphysematous changes in bilateral upper lobes. Spondylitic changes in the thoracic spine.   Review of the MIP images confirms the above findings.  IMPRESSION: 1. Negative for acute PE or thoracic aortic dissection. 2.  Right lower lobe pneumonia. 3. Atherosclerosis, including . coronary artery disease. Please note that although the presence of coronary artery calcium documents the presence of coronary artery disease, the severity of this disease and any potential stenosis cannot be assessed on this non-gated CT examination.  Assessment for potential risk factor modification, dietary therapy or pharmacologic therapy may be warranted, if clinically indicated.  CT ABDOMEN AND PELVIS  Findings: Two subhepatic surgical drains are in place.  There is peripheral enhancing loculated fluid in the anterior peritoneal space   measuring 1.8 x 11.4 cm in the midline at the level of the umbilicus and 1.6 x 9 cm on the right in the upper abdomen. There is mild periportal edema.  Portal vein is patent.  No focal liver lesion evident.  Unremarkable spleen, adrenal glands, kidneys. Residual pancreatic body and tail unremarkable.  Postop changes in the stomach and duodenum consistent with a history of Whipple procedure.  The gastric remnant is physiologically distended by fluid.  Small bowel nondilated.  Colon is nondilated.  No free air. 3 cm infrarenal abdominal aortic aneurysm.  Urinary bladder is distended, containing a few gas bubbles suggesting recent instrumentation.  No pelvic, retroperitoneal, or mesenteric adenopathy evident.  Regional bones unremarkable.  Review of the MIP images confirms the above findings.  IMPRESSION:  1.  Loculated anterior peritoneal fluid collections with some peripheral enhancement.  Infection cannot be      excluded.  These would be approachable for percutaneous aspiration if needed. 2.  Postoperative changes as above.   Original Report Authenticated By: Osa Craver, M.D.     Anti-infectives: Anti-infectives     Start     Dose/Rate Route Frequency Ordered Stop   06/16/12 0200   vancomycin (VANCOCIN) IVPB 1000 mg/200 mL premix        1,000 mg 200 mL/hr over 60 Minutes Intravenous Every 8 hours 06/16/12 0137     06/13/12 2359   vancomycin (VANCOCIN) 1,250 mg in sodium chloride 0.9 % 250 mL IVPB  Status:  Discontinued        1,250 mg 166.7 mL/hr over 90 Minutes Intravenous Every 12 hours 06/13/12 2335 06/16/12 0137   06/11/12 1000   vancomycin (VANCOCIN) IVPB 1000 mg/200 mL premix  Status:  Discontinued        1,000 mg 200 mL/hr over 60 Minutes Intravenous Every 12 hours 06/11/12 0924 06/13/12 2334   06/10/12 1000   piperacillin-tazobactam (ZOSYN) IVPB 3.375 g        3.375 g 12.5 mL/hr over 240 Minutes Intravenous Every 8 hours 06/10/12 0848     06/06/12 1800   ertapenem (INVANZ) 1 g in sodium chloride 0.9 % 50 mL IVPB        1 g 100 mL/hr over 30 Minutes Intravenous Every  24 hours 06/06/12 1740 06/06/12 1906   06/05/12 1433   ertapenem (INVANZ) 1 g in sodium chloride 0.9 % 50 mL IVPB        1 g 100 mL/hr over 30 Minutes Intravenous 60 min pre-op 06/05/12 1433 06/06/12 1030          Assessment/Plan: s/p Procedure(s) (LRB) with comments: WHIPPLE PROCEDURE (N/A) - Pancreatico duodenectomy LYSIS OF ADHESION (N/A) CHOLECYSTECTOMY (N/A) PANCREATIC STENT PLACEMENT (N/A) - Removal of biliary stent; Placement of pacreatic stent LYMPH NODE DISSECTION (N/A) - Portal lymph node dissection On dexmetatomidine for delirium per CCM Does have pulmonary infiltrate, but also has abdominal fluid collection  Await cultures. Send drains for amylase. Restart diet in AM.  Continue vanc/zosyn     LOS: 11 days    Rithik Odea 06/17/2012

## 2012-06-17 NOTE — Progress Notes (Signed)
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 He initially had biliary drain/ TNA & then underwent whipple's on 10/18 with Lysis of adhesions & cholecystectomy. Path showed - ampullary carcinoma T3N1. Post op course complicated by hyponatremia, post op ileusfevers started on vanc / zosyn 10/23. Altered mental status x 2 days , worse on 10/27 am requiring transfer to ICU -  not used his Morphine PCA for 12 hrs. 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 CT angio neg for PE, Right lower lobe pneumonia & Atherosclerosis Loculated anterior peritoneal fluid collections measuring 1.8 x 11.4 cm in the midline at the level of the umbilicus and 1.6 x 9 cm on the right in the upper abdomen  He does not drink, smokes 1-2 cigs/d He is from [place] , [place] , in [place] x 11 y, does speak some english, worked in a [employer]. His son , from [place]  & wife are at bedside   Events Since Admission: 10/27 precedex gtt  10/28 agitation improved, still quite delirious; CT guided drain placed into peritoneal fluid collection 10/29 much improved mental status  Current Status: sitting up in chair, oriented to [facility], 2013  Vital Signs: Temp:  [97 F (36.1 C)-99.5 F (37.5 C)] 97 F (36.1 C) (10/29 0800) Pulse Rate:  [52-109] 58  (10/29 0600) Resp:  [16-28] 28  (10/29 0600) BP: (84-116)/(37-75) 111/44 mmHg (10/29 0600) SpO2:  [95 %-100 %] 98 % (10/29 0600) Weight:  [67.9 kg (149 lb 11.1 oz)] 67.9 kg (149 lb 11.1 oz) (10/29 0400)  Physical Examination: Gen: groaning, oriented to name, hospital, year HEENT: NCAT, PERRL, OP clear PULM: CTA B CV: RRR, no mgr AB: surgical scar without drainage, redness, swelling; two drains in  place, BS+,  Ext: warm, no edema Neuro: sitting up in chair, smiling, conversant, oriented x3   Active Problems:  Biliary obstruction due to malignant neoplasm  Altered mental state  Peritoneal fluid collection  Pneumonia   ASSESSMENT AND PLAN  NEUROLOGIC  A:  Delirium - likely related to infections (HCAP, ? Peritoneal fluid collection) AND med effect; greatly improved with ABX, draining fluid, stopping oxycodone P:   Stop precedex Expect pain control to worsen off of precedex so increase frequency of prn fentanyl Add low dose prn seroquel qHS for sundowning  PULMONARY No results found for this basename: PHART:5,PCO2:5,PCO2ART:5,PO2ART:5,HCO3:5,O2SAT:5 in the last 168 hours   A:  RLL HCAP P:   Vanc/ zosyn, see ID  CARDIOVASCULAR  Lab 06/15/12 1106  TROPONINI <0.30  LATICACIDVEN --  PROBNP --   ECG:  NSR, small qs inf leads, QTC 446 Lines:RUE PICC  A: no acute issues P:  tele  RENAL  Lab 06/17/12 0424 06/16/12 0630 06/15/12 1334 06/15/12 0606 06/14/12 0432 06/12/12 0435  NA 135 132* 129* 132* 127* --  K 3.6 3.8 -- -- -- --  CL 105 101 101 102 94* --  CO2 19 19 17* 19 20 --  BUN 15 16 14 14 16  --  CREATININE 0.78 0.79 0.72 0.72 0.82 --  CALCIUM 7.8* 8.2* 7.8* 7.8* 7.8* --  MG -- 2.4 -- -- -- 2.0  PHOS -- 4.7* -- -- -- 4.2   Intake/Output  10/28 0701 - 10/29 0700 10/29 0701 - 10/30 0700   I.V. (mL/kg) 1100.8 (16.2) 86.7 (1.3)   IV Piggyback 760    TPN 2060 90   Total Intake(mL/kg) 3920.8 (57.7) 176.7 (2.6)   Urine (mL/kg/hr) 2200 (1.4)    Drains 110    Total Output 2310    Net +1610.8 +176.7        Stool Occurrence 2 x     Foley:    A:  Hyponatremia improving P:   -continue TPN, NS  GASTROINTESTINAL  Lab 06/17/12 0424 06/16/12 0630 06/15/12 1334 06/13/12 0430 06/12/12 0435  AST 30 39* 41* 50* 44*  ALT 65* 78* 78* 97* 91*  ALKPHOS 150* 158* 156* 123* 116  BILITOT 0.9 1.1 1.1 1.5* 1.8*  PROT 5.5* 6.3 5.8* 6.0 5.8*  ALBUMIN 1.8* 2.1*  1.9* 2.0* 1.8*    A:  Protein calorie malnutrition S/p whipple's for T3N1 periampullary CA P:   TNA Will need chemo/RT eventually Advance diet as tolerated  HEMATOLOGIC  Lab 06/17/12 0424 06/16/12 0630 06/15/12 0606 06/13/12 0430 06/12/12 0435  HGB 9.4* 9.9* 10.0* 9.7* 9.8*  HCT 28.0* 29.0* 29.4* 28.4* 27.7*  PLT 253 238 243 205 192  INR -- -- 1.02 -- --  APTT -- -- -- -- --   A:  Anemia of chronic disease, not bleeding P:   sq lovenox  INFECTIOUS  Lab 06/17/12 0424 06/16/12 0630 06/15/12 0606 06/13/12 0430 06/12/12 0435  WBC 9.9 14.5* 14.3* 12.6* 12.5*  PROCALCITON -- -- -- -- --   Cultures: 10/29 abdominal fluid >>  Antibiotics: vanc 10/22 > Zosyn 10/22 >>  A:  Abdominal abscess RLL HCAP P:   Empiric abx F/u drain culture Stop vanc today  ENDOCRINE  Lab 06/17/12 0731 06/17/12 0413 06/17/12 0014 06/16/12 1631 06/16/12 1150  GLUCAP 138* 136* 144* 149* 138*   A:  No issues   P:   SSI while on TNA   BEST PRACTICE / DISPOSITION DVT Px:  lovenox GI Px:  protonix Social / Family:  Updated family at bedside   Yolonda Kida PCCM Pager: [phone] Cell: [phone] If no response, call [phone]  06/17/2012, 9:11 AM

## 2012-06-17 NOTE — Progress Notes (Signed)
PARENTERAL NUTRITION CONSULT NOTE - FOLLOW UP  Pharmacy Consult for TNA Indication: bowel rest s/p Whipple procedure  No Known Allergies  Patient Measurements: Height: 5\' 6"  (167.6 cm) Weight: 149 lb 11.1 oz (67.9 kg) IBW/kg (Calculated) : 63.8   Vital Signs: Temp: 98.1 F (36.7 C) (10/29 0400) Temp src: Oral (10/29 0400) BP: 108/47 mmHg (10/29 0400) Pulse Rate: 61  (10/29 0400) Intake/Output from previous day: 10/28 0701 - 10/29 0700 In: 3102.4 [I.V.:462.4; IV Piggyback:760; TPN:1880] Out: 2310 [Urine:2200; Drains:110]  Labs:  [facility] 06/17/12 0424 06/16/12 0630 06/15/12 0606  WBC 9.9 14.5* 14.3*  HGB 9.4* 9.9* 10.0*  HCT 28.0* 29.0* 29.4*  PLT 253 238 243  APTT -- -- --  INR -- -- 1.02     Basename 06/17/12 0424 06/16/12 0630 06/15/12 1334  NA 135 132* 129*  K 3.6 3.8 3.8  CL 105 101 101  CO2 19 19 17*  GLUCOSE 126* 93 121*  BUN 15 16 14   CREATININE 0.78 0.79 0.72  LABCREA -- -- --  CREAT24HRUR -- -- --  CALCIUM 7.8* 8.2* 7.8*  MG -- 2.4 --  PHOS -- 4.7* --  PROT 5.5* 6.3 5.8*  ALBUMIN 1.8* 2.1* 1.9*  AST 30 39* 41*  ALT 65* 78* 78*  ALKPHOS 150* 158* 156*  BILITOT 0.9 1.1 1.1  BILIDIR -- -- --  IBILI -- -- --  PREALBUMIN -- 9.3* --  TRIG -- 159* --  CHOLHDL -- -- --  CHOL -- 112 --  10/28 Corrected Ca 9.7  Estimated Creatinine Clearance: 83.1 ml/min (by C-G formula based on Cr of 0.78).    Basename 06/17/12 0413 06/17/12 0014 06/16/12 1631  GLUCAP 136* 144* 149*   CBGs & Insulin requirements past 24 hours: CBGs 131-149 15 units SSI resistant scale   Nutritional Goals:  Per RD recs 10/25: 1800-2100 Kcal/d and 95-105g protein/d. Clinimix E 5/20 at 80 ml/hr + IVFE 20% at 41ml/hr MWF to provide: 96 g/day protein and 2169 Kcal/day MWF, 1689 Kcal/day STTHS (Avg. 1869 Kcal/day weekly).  Current nutrition:  Diet: Clear liquid (10/29) TNA: Clinimix E 5/20 @ goal rate of 80 ml/hr, lipids 20% @ 105ml/hr on MWF only mIVF: NS @ 50  ml/hr   Assessment:  66 YOM found to have malignant stricture of the common bile duct during 9/12 admission. TNA was started inpatient on 9/21 and pt discharged 10/3 on TNA with eventual plan for surgery after nutritional status improved. Pt underwent Whipple procedure and placement of pancreatic stent on 10/18. Pharmacy asked to start TNA on 10/19. Home TNA per [facility] was: over 16hrs daily. 105g protein. No lipids TWThSS: 1439Kcal. With lipids MF: 2651Kcal  for avg. 1785Kcal/d.   Transferred to ICU 10/27 with AMS  Was advanced to FLD on 10/26, but changed back to NPO on 10/28.  Peritoneal fluid collection drained by IR (10/28).  CLD resumed 10/29  Labs Renal: Scr wnl, stable Hepatic function: Alk phos and AST/ALT slightly elevated, but decreasing Electrolytes:  WNL (Na improved, oral supplement d/c 10/27) Pre-Albumin: 20 (10/19), 10.2 (10/21), 9.3 (10/28) TG/Cholesterol: wnl (10/21), 159/112 (10/28) - watch TG CBGs: at goal range, cont SSI  Plan:    Continue TNA with Clinimix E 5/20 @ goal rate of 80 ml/hr   TNA to contain IV fat emulsion, standard multivitamins and trace elements only on MWF only due to ongoing shortage  TNA labs Monday/Thursdays, TG on Thursday  Pharmacy will f/u daily   Lynann Beaver PharmD, BCPS Pager [phone]  06/17/2012 10:19 AM

## 2012-06-17 NOTE — Progress Notes (Signed)
Subjective:  Further improvement in confusion with T max 99.5  Objective:  Vital Signs in the last 24 hours: Temp:  [97 F (36.1 C)-99.5 F (37.5 C)] 98.5 F (36.9 C) (10/29 1200) Pulse Rate:  [53-64] 64  (10/29 1200) Cardiac Rhythm:  [-] Normal sinus rhythm (10/28 1900) Resp:  [20-28] 25  (10/29 1200) BP: (99-116)/(40-70) 112/70 mmHg (10/29 1200) SpO2:  [96 %-99 %] 98 % (10/29 1200) Weight:  [67.9 kg (149 lb 11.1 oz)] 67.9 kg (149 lb 11.1 oz) (10/29 0400)  Physical Exam: BP Readings from Last 1 Encounters:  06/17/12 112/70    Wt Readings from Last 1 Encounters:  06/17/12 67.9 kg (149 lb 11.1 oz)    Weight change: 1 kg (2 lb 3.3 oz)  HEENT: [place]/AT, Eyes-Brown, PERL, EOMI, Conjunctiva-Pale pink, Sclera-Non-icteric Neck: No JVD, No bruit, Trachea midline. Lungs:  Clear, Bilateral. Cardiac:  Regular rhythm, normal S1 and S2, no S3.  Abdomen:  Soft, non-tender. Extremities:  No edema present. No cyanosis. No clubbing. CNS: AxOx3, Cranial nerves grossly intact, moves all 4 extremities. Right handed. Skin: Warm and dry.   Intake/Output from previous day: 10/28 0701 - 10/29 0700 In: 3920.8 [I.V.:1100.8; IV Piggyback:760; TPN:2060] Out: 2310 [Urine:2200; Drains:110]    Lab Results: BMET    Component Value Date/Time   NA 135 06/17/2012 0424   K 3.6 06/17/2012 0424   CL 105 06/17/2012 0424   CO2 19 06/17/2012 0424   GLUCOSE 126* 06/17/2012 0424   BUN 15 06/17/2012 0424   CREATININE 0.78 06/17/2012 0424   CALCIUM 7.8* 06/17/2012 0424   GFRNONAA >90 06/17/2012 0424   GFRAA >90 06/17/2012 0424   CBC    Component Value Date/Time   WBC 9.9 06/17/2012 0424   RBC 3.42* 06/17/2012 0424   HGB 9.4* 06/17/2012 0424   HCT 28.0* 06/17/2012 0424   PLT 253 06/17/2012 0424   MCV 81.9 06/17/2012 0424   MCH 27.5 06/17/2012 0424   MCHC 33.6 06/17/2012 0424   RDW 15.6* 06/17/2012 0424   LYMPHSABS 1.9 06/16/2012 0630   MONOABS 1.4* 06/16/2012 0630   EOSABS 0.7 06/16/2012 0630   BASOSABS 0.0 06/16/2012 0630   CARDIAC ENZYMES Lab Results  Component Value Date   TROPONINI <0.30 06/15/2012    Assessment/Plan:  Patient Active Hospital Problem List: Altered mental state (06/15/2012) Peritoneal fluid collection (06/16/2012) -S/P drain  Pneumonia (06/16/2012) -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    LOS: 11 days    Orpah Cobb  MD  06/17/2012, 2:11 PM

## 2012-06-18 LAB — GLUCOSE, CAPILLARY
Glucose-Capillary: 120 mg/dL — ABNORMAL HIGH (ref 70–99)
Glucose-Capillary: 130 mg/dL — ABNORMAL HIGH (ref 70–99)
Glucose-Capillary: 121 mg/dL — ABNORMAL HIGH (ref 70–99)

## 2012-06-18 LAB — CLOSTRIDIUM DIFFICILE BY PCR: Toxigenic C. Difficile by PCR: NEGATIVE

## 2012-06-18 MED ORDER — FAT EMULSION 20 % IV EMUL
250.0000 mL | INTRAVENOUS | Status: AC
  Administered 2012-06-18: 250 mL via INTRAVENOUS
  Filled 2012-06-18: qty 250

## 2012-06-18 MED ORDER — TRACE MINERALS CR-CU-F-FE-I-MN-MO-SE-ZN IV SOLN
INTRAVENOUS | Status: AC
  Administered 2012-06-18: 18:00:00 via INTRAVENOUS
  Filled 2012-06-18: qty 2000

## 2012-06-18 MED ORDER — PANCRELIPASE (LIP-PROT-AMYL) 12000-38000 UNITS PO CPEP
2.0000 | ORAL_CAPSULE | Freq: Three times a day (TID) | ORAL | Status: DC
  Administered 2012-06-19 – 2012-06-25 (×8): 2 via ORAL
  Filled 2012-06-18 (×23): qty 2

## 2012-06-18 NOTE — Progress Notes (Signed)
PARENTERAL NUTRITION CONSULT NOTE - FOLLOW UP  Pharmacy Consult for TNA Indication: bowel rest s/p Whipple procedure  No Known Allergies  Patient Measurements: Height: 5\' 6"  (167.6 cm) Weight: 147 lb 7.8 oz (66.9 kg) IBW/kg (Calculated) : 63.8   Vital Signs: Temp: 97.4 F (36.3 C) (10/30 0800) Temp src: Oral (10/30 0800) BP: 156/60 mmHg (10/30 0600) Pulse Rate: 80  (10/30 0600) Intake/Output from previous day: 10/29 0701 - 10/30 0700 In: 3475.9 [P.O.:30; I.V.:1448.4; IV Piggyback:137.5; TPN:1860] Out: 1193 [Urine:1125; Drains:68]  Labs:  [facility] 06/17/12 0424 06/16/12 0630  WBC 9.9 14.5*  HGB 9.4* 9.9*  HCT 28.0* 29.0*  PLT 253 238  APTT -- --  INR -- --     Basename 06/17/12 0424 06/16/12 0630 06/15/12 1334  NA 135 132* 129*  K 3.6 3.8 3.8  CL 105 101 101  CO2 19 19 17*  GLUCOSE 126* 93 121*  BUN 15 16 14   CREATININE 0.78 0.79 0.72  LABCREA -- -- --  CREAT24HRUR -- -- --  CALCIUM 7.8* 8.2* 7.8*  MG -- 2.4 --  PHOS -- 4.7* --  PROT 5.5* 6.3 5.8*  ALBUMIN 1.8* 2.1* 1.9*  AST 30 39* 41*  ALT 65* 78* 78*  ALKPHOS 150* 158* 156*  BILITOT 0.9 1.1 1.1  BILIDIR -- -- --  IBILI -- -- --  PREALBUMIN -- 9.3* --  TRIG -- 159* --  CHOLHDL -- -- --  CHOL -- 112 --  10/28 Corrected Ca 9.7  Estimated Creatinine Clearance: 83.1 ml/min (by C-G formula based on Cr of 0.78).    Basename 06/18/12 0749 06/18/12 0456 06/18/12 0003  GLUCAP 130* 120* 119*   CBGs & Insulin requirements past 24 hours: CBGs 111-130 0 units SSI resistant scale   Nutritional Goals:  Per RD recs 10/25: 1800-2100 Kcal/d and 95-105g protein/d. Clinimix E 5/20 at 80 ml/hr + IVFE 20% at 51ml/hr MWF to provide: 96 g/day protein and 2169 Kcal/day MWF, 1689 Kcal/day STTHS (Avg. 1869 Kcal/day weekly).  Current nutrition:  Diet: Clear liquid (10/29) TNA: Clinimix E 5/20 @ goal rate of 80 ml/hr, lipids 20% @ 72ml/hr on MWF only mIVF: NS @ 50 ml/hr   Assessment:  66 YOM found to have  malignant stricture of the common bile duct during 9/12 admission. TNA was started inpatient on 9/21 and pt discharged 10/3 on TNA with eventual plan for surgery after nutritional status improved. Pt underwent Whipple procedure and placement of pancreatic stent on 10/18. Pharmacy asked to start TNA on 10/19. Home TNA per [facility] was: over 16hrs daily. 105g protein. No lipids TWThSS: 1439Kcal. With lipids MF: 2651Kcal  for avg. 1785Kcal/d.   Transferred to ICU 10/27 with AMS  Was advanced to FLD on 10/26, but changed back to NPO on 10/28.  Peritoneal fluid collection drained by IR (10/28).  CLD resumed 10/29  Labs (from 10/29) Renal: Scr wnl, stable Hepatic function: Alk phos and AST/ALT slightly elevated, but decreasing Electrolytes:  WNL (Na improved, oral supplement d/c 10/27) Pre-Albumin: 20 (10/19), 10.2 (10/21), 9.3 (10/28) TG/Cholesterol: wnl (10/21), 159/112 (10/28) - watch TG CBGs: at goal range, cont SSI  Plan:    Continue TNA with Clinimix E 5/20 @ goal rate of 80 ml/hr   TNA to contain IV fat emulsion, standard multivitamins and trace elements only on MWF only due to ongoing shortage  TNA labs Monday/Thursdays, TG on Thursday  Pharmacy will f/u daily  Lisandra Mathisen, Loma Messing PharmD Pager #: [phone] 8:29 AM 06/18/2012

## 2012-06-18 NOTE — Progress Notes (Signed)
Patient ID: Colin Castro, male   DOB: [birth year], 66 y.o.   MRN: [id number] 12 Days Post-Op  Subjective: Mental status cleared.  No pain.    Objective: Vital signs in last 24 hours: Temp:  [97.4 F (36.3 C)-100.1 F (37.8 C)] 97.4 F (36.3 C) (10/30 0800) Pulse Rate:  [57-85] 85  (10/30 0903) Resp:  [24-29] 27  (10/30 0903) BP: (99-156)/(40-70) 156/60 mmHg (10/30 0600) SpO2:  [95 %-100 %] 98 % (10/30 0903) Weight:  [147 lb 7.8 oz (66.9 kg)] 147 lb 7.8 oz (66.9 kg) (10/30 0022) Last BM Date: 06/17/12  Intake/Output from previous day: 10/29 0701 - 10/30 0700 In: 3605.9 [P.O.:30; I.V.:1498.4; IV Piggyback:137.5; TPN:1940] Out: 1193 [Urine:1125; Drains:68] Intake/Output this shift: Total I/O In: 260 [I.V.:100; TPN:160] Out: -   Resp: clear to auscultation bilaterally Cardio: regular rate and rhythm, GI: soft, minimal tenderness. good bs and flatus. incision looks good Drain  serosang.    Lab Results:   Basename 06/17/12 0424 06/16/12 0630  WBC 9.9 14.5*  HGB 9.4* 9.9*  HCT 28.0* 29.0*  PLT 253 238   BMET  Basename 06/17/12 0424 06/16/12 0630  NA 135 132*  K 3.6 3.8  CL 105 101  CO2 19 19  GLUCOSE 126* 93  BUN 15 16  CREATININE 0.78 0.79  CALCIUM 7.8* 8.2*   PT/INR No results found for this basename: LABPROT:2,INR:2 in the last 72 hours ABG No results found for this basename: PHART:2,PCO2:2,PO2:2,HCO3:2 in the last 72 hours  Studies/Results: Ct Guided Abscess Drain  06/16/2012  *RADIOLOGY REPORT*  Clinical history:Post Whipple procedure.  Suspicious fluid collection in the anterior abdominal cavity.  PROCEDURE(S): CT GUIDED DRAIN PLACEMENT IN ANTERIOR ABDOMINAL FLUID COLLECTION  Physician: Rachelle Hora. Henn, MD  Medications:Versed 0.5 mg, Fentanyl 25 mcg. A radiology nurse monitored the patient for moderate sedation.  Moderate sedation time:26 minutes  Procedure:Informed consent was obtained for a percutaneous drain placement.  The patient was placed supine on the CT  scanner. Images through the abdomen were obtained.  Fluid collection in the anterior abdominal cavity was identified.  The skin just left of the midline staples was prepped and draped in a sterile fashion. Skin was anesthetized with lidocaine.  A 5-French Yueh catheter was directed into the collection with CT guidance.   Cloudy brown fluid was aspirated.  An Amplatz wire did not easily tract within the collection.  The Amplatz wire was exchanged for a Bentson wire. The tract was dilated and a 10-French multipurpose drain was placed within the collection.  35 ml of cloudy brown fluid was aspirated. Catheter was sutured to the skin and the catheter was attached to a suction bulb.  Findings:Fluid collection within the anterior abdominal cavity.  At the end of the procedure, majority of fluid collection was decompressed.  Complications: None  Impression:CT guided drain placement within the anterior abdominal fluid collection.  Sample sent for Gram stain and culture.   Original Report Authenticated By: Richarda Overlie, M.D.     Anti-infectives: Anti-infectives     Start     Dose/Rate Route Frequency Ordered Stop   06/16/12 0200   vancomycin (VANCOCIN) IVPB 1000 mg/200 mL premix  Status:  Discontinued        1,000 mg 200 mL/hr over 60 Minutes Intravenous Every 8 hours 06/16/12 0137 06/17/12 0929   06/13/12 2359   vancomycin (VANCOCIN) 1,250 mg in sodium chloride 0.9 % 250 mL IVPB  Status:  Discontinued        1,250 mg 166.7  mL/hr over 90 Minutes Intravenous Every 12 hours 06/13/12 2335 06/16/12 0137   06/11/12 1000   vancomycin (VANCOCIN) IVPB 1000 mg/200 mL premix  Status:  Discontinued        1,000 mg 200 mL/hr over 60 Minutes Intravenous Every 12 hours 06/11/12 0924 06/13/12 2334   06/10/12 1000   piperacillin-tazobactam (ZOSYN) IVPB 3.375 g        3.375 g 12.5 mL/hr over 240 Minutes Intravenous Every 8 hours 06/10/12 0848     06/06/12 1800   ertapenem (INVANZ) 1 g in sodium chloride 0.9 % 50 mL IVPB          1 g 100 mL/hr over 30 Minutes Intravenous Every 24 hours 06/06/12 1740 06/06/12 1906   06/05/12 1433   ertapenem (INVANZ) 1 g in sodium chloride 0.9 % 50 mL IVPB        1 g 100 mL/hr over 30 Minutes Intravenous 60 min pre-op 06/05/12 1433 06/06/12 1030          Assessment/Plan: s/p Procedure(s) (LRB) with comments: WHIPPLE PROCEDURE (N/A) - Pancreatico duodenectomy LYSIS OF ADHESION (N/A) CHOLECYSTECTOMY (N/A) PANCREATIC STENT PLACEMENT (N/A) - Removal of biliary stent; Placement of pacreatic stent LYMPH NODE DISSECTION (N/A) - Portal lymph node dissection  Zosyn for pneumonia.   Await drain cultures. Drains negative for amylase  Restart diet  Add creon for panc insufficiency  D/C drains 1 and 2    LOS: 12 days    Colin Castro 06/18/2012

## 2012-06-18 NOTE — Progress Notes (Signed)
Pt tx to 1537

## 2012-06-18 NOTE — Progress Notes (Signed)
12 Days Post-Op  Subjective: Pt doing ok; still has some intermittent abd pain; no nausea/vomiting  Objective: Vital signs in last 24 hours: Temp:  [97.4 F (36.3 C)-100.1 F (37.8 C)] 97.4 F (36.3 C) (10/30 0800) Pulse Rate:  [64-85] 85  (10/30 0903) Resp:  [24-29] 27  (10/30 0903) BP: (112-156)/(44-70) 156/60 mmHg (10/30 0600) SpO2:  [95 %-100 %] 98 % (10/30 0903) Weight:  [147 lb 7.8 oz (66.9 kg)] 147 lb 7.8 oz (66.9 kg) (10/30 0022) Last BM Date: 06/17/12  Intake/Output from previous day: 10/29 0701 - 10/30 0700 In: 3605.9 [P.O.:30; I.V.:1498.4; IV Piggyback:137.5; TPN:1940] Out: 1193 [Urine:1125; Drains:68] Intake/Output this shift: Total I/O In: 260 [I.V.:100; TPN:160] Out: -   Left ant abd drain intact, insertion site ok, output about 20 cc's brown fluid; cx's- pend  Lab Results:   Basename 06/17/12 0424 06/16/12 0630  WBC 9.9 14.5*  HGB 9.4* 9.9*  HCT 28.0* 29.0*  PLT 253 238   BMET  Basename 06/17/12 0424 06/16/12 0630  NA 135 132*  K 3.6 3.8  CL 105 101  CO2 19 19  GLUCOSE 126* 93  BUN 15 16  CREATININE 0.78 0.79  CALCIUM 7.8* 8.2*   PT/INR No results found for this basename: LABPROT:2,INR:2 in the last 72 hours ABG No results found for this basename: PHART:2,PCO2:2,PO2:2,HCO3:2 in the last 72 hours Results for orders placed during the hospital encounter of 06/06/12  BODY FLUID CULTURE     Status: Normal (Preliminary result)   Collection Time   06/16/12  1:40 PM      Component Value Range Status Comment   Specimen Description     Final    Value: FLUID PERITONEAL CT GUIDED DRAINAGE CATHETER IN ANTERIOR ABDOMINAL FLUID COLLECTION   Special Requests NONE   Final    Gram Stain     Final    Value: FEW WBC PRESENT, PREDOMINANTLY MONONUCLEAR     NO ORGANISMS SEEN   Culture NO GROWTH   Final    Report Status PENDING   Incomplete     Studies/Results: Ct Guided Abscess Drain  06/16/2012  *RADIOLOGY REPORT*  Clinical history:Post Whipple  procedure.  Suspicious fluid collection in the anterior abdominal cavity.  PROCEDURE(S): CT GUIDED DRAIN PLACEMENT IN ANTERIOR ABDOMINAL FLUID COLLECTION  Physician: Rachelle Hora. Henn, MD  Medications:Versed 0.5 mg, Fentanyl 25 mcg. A radiology nurse monitored the patient for moderate sedation.  Moderate sedation time:26 minutes  Procedure:Informed consent was obtained for a percutaneous drain placement.  The patient was placed supine on the CT scanner. Images through the abdomen were obtained.  Fluid collection in the anterior abdominal cavity was identified.  The skin just left of the midline staples was prepped and draped in a sterile fashion. Skin was anesthetized with lidocaine.  A 5-French Yueh catheter was directed into the collection with CT guidance.   Cloudy brown fluid was aspirated.  An Amplatz wire did not easily tract within the collection.  The Amplatz wire was exchanged for a Bentson wire. The tract was dilated and a 10-French multipurpose drain was placed within the collection.  35 ml of cloudy brown fluid was aspirated. Catheter was sutured to the skin and the catheter was attached to a suction bulb.  Findings:Fluid collection within the anterior abdominal cavity.  At the end of the procedure, majority of fluid collection was decompressed.  Complications: None  Impression:CT guided drain placement within the anterior abdominal fluid collection.  Sample sent for Gram stain and culture.  Original Report Authenticated By: Richarda Overlie, M.D.     Anti-infectives: Anti-infectives     Start     Dose/Rate Route Frequency Ordered Stop   06/16/12 0200   vancomycin (VANCOCIN) IVPB 1000 mg/200 mL premix  Status:  Discontinued        1,000 mg 200 mL/hr over 60 Minutes Intravenous Every 8 hours 06/16/12 0137 06/17/12 0929   06/13/12 2359   vancomycin (VANCOCIN) 1,250 mg in sodium chloride 0.9 % 250 mL IVPB  Status:  Discontinued        1,250 mg 166.7 mL/hr over 90 Minutes Intravenous Every 12 hours  06/13/12 2335 06/16/12 0137   06/11/12 1000   vancomycin (VANCOCIN) IVPB 1000 mg/200 mL premix  Status:  Discontinued        1,000 mg 200 mL/hr over 60 Minutes Intravenous Every 12 hours 06/11/12 0924 06/13/12 2334   06/10/12 1000   piperacillin-tazobactam (ZOSYN) IVPB 3.375 g        3.375 g 12.5 mL/hr over 240 Minutes Intravenous Every 8 hours 06/10/12 0848     06/06/12 1800   ertapenem (INVANZ) 1 g in sodium chloride 0.9 % 50 mL IVPB        1 g 100 mL/hr over 30 Minutes Intravenous Every 24 hours 06/06/12 1740 06/06/12 1906   06/05/12 1433   ertapenem (INVANZ) 1 g in sodium chloride 0.9 % 50 mL IVPB        1 g 100 mL/hr over 30 Minutes Intravenous 60 min pre-op 06/05/12 1433 06/06/12 1030          Assessment/Plan: s/p left ant abd drain placement 10/28; check final cx's; check f/u CT once output minimal; other plans as per CCS.   LOS: 12 days    ALLRED,D [facility] 06/18/2012

## 2012-06-18 NOTE — Progress Notes (Addendum)
Name: Colin Castro MRN: [id number] DOB: 23-Dec-1945    LOS: 12  Referring Provider:  Carolynne Edouard, CCS Reason for Referral:  Altered mental status  PULMONARY / CRITICAL CARE MEDICINE  HPI:  66/M with recent diagnosis of malignant stricture of common bile duct  & gastric outlet obstruction. He initially had biliary drain/ TNA & then underwent whipple's on 10/18 with Lysis of adhesions & cholecystectomy. Path showed - ampullary carcinoma T3N1. Post op course complicated by hyponatremia, post op ileusfevers started on vanc / zosyn 10/23. Altered mental status x 2 days , worse on 10/27 am requiring transfer to ICU -  not used his Morphine PCA for 12 hrs. 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 CT angio neg for PE, Right lower lobe pneumonia & Atherosclerosis Loculated anterior peritoneal fluid collections measuring 1.8 x 11.4 cm in the midline at the level of the umbilicus and 1.6 x 9 cm on the right in the upper abdomen  He does not drink, smokes 1-2 cigs/d He is from [place] , [place] , in [place] x 11 y, does speak some english, worked in a [employer]. His son , from [place]  & wife are at bedside   Events Since Admission: 10/27 precedex gtt  10/28 agitation improved, still quite delirious; CT guided drain placed into peritoneal fluid collection 10/29 much improved mental status  Current Status: comfortable in bed  Vital Signs: Temp:  [97.4 F (36.3 C)-100.1 F (37.8 C)] 97.4 F (36.3 C) (10/30 0800) Pulse Rate:  [57-85] 85  (10/30 0903) Resp:  [24-29] 27  (10/30 0903) BP: (99-156)/(40-70) 156/60 mmHg (10/30 0600) SpO2:  [95 %-100 %] 98 % (10/30 0903) Weight:  [66.9 kg (147 lb 7.8 oz)] 66.9 kg (147 lb 7.8 oz) (10/30 0022)  Physical Examination: Gen: comfortable, pleasant HEENT: NCAT, PERRL, OP clear PULM: CTA B CV: RRR, no mgr AB: surgical scar without drainage, redness, swelling; two drains in place, BS+,  Ext: warm, no edema Neuro:  smiling, conversant, oriented x3   Active Problems:  Biliary obstruction due to malignant neoplasm  Altered mental state  Peritoneal fluid collection  Pneumonia   ASSESSMENT AND PLAN  NEUROLOGIC  A:  Delirium - likely related to infections (HCAP, ? Peritoneal fluid collection) AND med effect; greatly improved with ABX, draining fluid, stopping oxycodone P:   Expect pain control to worsen off of precedex so increase frequency of prn fentanyl Add low dose prn seroquel qHS for sundowning  PULMONARY No results found for this basename: PHART:5,PCO2:5,PCO2ART:5,PO2ART:5,HCO3:5,O2SAT:5 in the last 168 hours   A:  RLL HCAP P:   zosyn, see ID  CARDIOVASCULAR  Lab 06/15/12 1106  TROPONINI <0.30  LATICACIDVEN --  PROBNP --   ECG:  NSR, small qs inf leads, QTC 446 Lines:RUE PICC  A: no acute issues P:  tele  RENAL  Lab 06/17/12 0424 06/16/12 0630 06/15/12 1334 06/15/12 0606 06/14/12 0432 06/12/12 0435  NA 135 132* 129* 132* 127* --  K 3.6 3.8 -- -- -- --  CL 105 101 101 102 94* --  CO2 19 19 17* 19 20 --  BUN 15 16 14 14 16  --  CREATININE 0.78 0.79 0.72 0.72 0.82 --  CALCIUM 7.8* 8.2* 7.8* 7.8* 7.8* --  MG -- 2.4 -- -- -- 2.0  PHOS -- 4.7* -- -- -- 4.2   Intake/Output      10/29 0701 - 10/30 0700 10/30 0701 - 10/31 0700   P.O. 30  I.V. (mL/kg) 1498.4 (22.4) 100 (1.5)   IV Piggyback 137.5    TPN 1940 160   Total Intake(mL/kg) 3605.9 (53.9) 260 (3.9)   Urine (mL/kg/hr) 1125 (0.7)    Drains 68    Total Output 1193    Net +2412.9 +260        Urine Occurrence 1 x    Stool Occurrence 4 x     Foley:    A:  Hyponatremia improving P:   -continue TPN, NS per CCS  GASTROINTESTINAL  Lab 06/17/12 0424 06/16/12 0630 06/15/12 1334 06/13/12 0430 06/12/12 0435  AST 30 39* 41* 50* 44*  ALT 65* 78* 78* 97* 91*  ALKPHOS 150* 158* 156* 123* 116  BILITOT 0.9 1.1 1.1 1.5* 1.8*  PROT 5.5* 6.3 5.8* 6.0 5.8*  ALBUMIN 1.8* 2.1* 1.9* 2.0* 1.8*    A:   Protein calorie  malnutrition S/p whipple's for T3N1 periampullary CA  P:   TPN per CCS Will need chemo/RT eventually Advance diet as tolerated  HEMATOLOGIC  Lab 06/17/12 0424 06/16/12 0630 06/15/12 0606 06/13/12 0430 06/12/12 0435  HGB 9.4* 9.9* 10.0* 9.7* 9.8*  HCT 28.0* 29.0* 29.4* 28.4* 27.7*  PLT 253 238 243 205 192  INR -- -- 1.02 -- --  APTT -- -- -- -- --   A:  Anemia of chronic disease, not bleeding P:   sq lovenox  INFECTIOUS  Lab 06/17/12 0424 06/16/12 0630 06/15/12 0606 06/13/12 0430 06/12/12 0435  WBC 9.9 14.5* 14.3* 12.6* 12.5*  PROCALCITON -- -- -- -- --   Cultures: 10/29 abdominal fluid >>NGTD  Antibiotics: vanc 10/22 >10/29 Zosyn 10/22 >>  A:  Abdominal abscess? RLL HCAP P:   Empiric abx; would treat HCAP for 7 days F/u drain culture  ENDOCRINE  Lab 06/18/12 0749 06/18/12 0456 06/18/12 0003 06/17/12 1906 06/17/12 1524  GLUCAP 130* 120* 119* 111* 112*   A:  No issues   P:   SSI while on TNA   BEST PRACTICE / DISPOSITION DVT Px:  lovenox GI Px:  protonix Social / Family:  Updated family at bedside  PCCM to sign off, call if questions  Yolonda Kida PCCM Pager: [phone] Cell: [phone] If no response, call [phone]  06/18/2012, 9:07 AM

## 2012-06-18 NOTE — Progress Notes (Signed)
Report given to Risk analyst at [facility].

## 2012-06-18 NOTE — Progress Notes (Signed)
Subjective:  Has diarrhea. On clear liquids. + generalized abdominal pain. T max 100.1 F  Objective:  Vital Signs in the last 24 hours: Temp:  [97.4 F (36.3 C)-100.1 F (37.8 C)] 97.4 F (36.3 C) (10/30 0800) Pulse Rate:  [57-80] 80  (10/30 0600) Cardiac Rhythm:  [-] Normal sinus rhythm (10/29 2000) Resp:  [24-29] 27  (10/30 0600) BP: (99-156)/(40-70) 156/60 mmHg (10/30 0600) SpO2:  [95 %-100 %] 95 % (10/30 0600) Weight:  [66.9 kg (147 lb 7.8 oz)] 66.9 kg (147 lb 7.8 oz) (10/30 0022)  Physical Exam: BP Readings from Last 1 Encounters:  06/18/12 156/60    Wt Readings from Last 1 Encounters:  06/18/12 66.9 kg (147 lb 7.8 oz)    Weight change: -1 kg (-2 lb 3.3 oz)  HEENT: [name]/AT, Eyes-Brown, PERL, EOMI, Conjunctiva-Pale pink, Sclera-Non-icteric Neck: No JVD, No bruit, Trachea midline. Lungs:  Clear, Bilateral. Cardiac:  Regular rhythm, normal S1 and S2, no S3.  Abdomen:  Soft, mild tenderness all over. Dressing with binder and drains. Extremities:  No edema present. No cyanosis. No clubbing. CNS: AxOx3, Cranial nerves grossly intact, moves all 4 extremities. Right handed. Skin: Warm and dry.   Intake/Output from previous day: 10/29 0701 - 10/30 0700 In: 3475.9 [P.O.:30; I.V.:1448.4; IV Piggyback:137.5; TPN:1860] Out: 1193 [Urine:1125; Drains:68]    Lab Results: BMET    Component Value Date/Time   NA 135 06/17/2012 0424   K 3.6 06/17/2012 0424   CL 105 06/17/2012 0424   CO2 19 06/17/2012 0424   GLUCOSE 126* 06/17/2012 0424   BUN 15 06/17/2012 0424   CREATININE 0.78 06/17/2012 0424   CALCIUM 7.8* 06/17/2012 0424   GFRNONAA >90 06/17/2012 0424   GFRAA >90 06/17/2012 0424   CBC    Component Value Date/Time   WBC 9.9 06/17/2012 0424   RBC 3.42* 06/17/2012 0424   HGB 9.4* 06/17/2012 0424   HCT 28.0* 06/17/2012 0424   PLT 253 06/17/2012 0424   MCV 81.9 06/17/2012 0424   MCH 27.5 06/17/2012 0424   MCHC 33.6 06/17/2012 0424   RDW 15.6* 06/17/2012 0424   LYMPHSABS  1.9 06/16/2012 0630   MONOABS 1.4* 06/16/2012 0630   EOSABS 0.7 06/16/2012 0630   BASOSABS 0.0 06/16/2012 0630   CARDIAC ENZYMES Lab Results  Component Value Date   TROPONINI <0.30 06/15/2012    Assessment/Plan:  Patient Active Hospital Problem List: Altered mental state (06/15/2012) Peritoneal fluid collection (06/16/2012) -S/P drain  Pneumonia (06/16/2012)  -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 Diarrhea  C. Difficile by PCR.    LOS: 12 days    Colin Cobb  MD  06/18/2012, 8:48 AM

## 2012-06-19 LAB — COMPREHENSIVE METABOLIC PANEL
AST: 22 U/L (ref 0–37)
BUN: 12 mg/dL (ref 6–23)
CO2: 20 mEq/L (ref 19–32)
Chloride: 102 mEq/L (ref 96–112)
Creatinine, Ser: 0.81 mg/dL (ref 0.50–1.35)
GFR calc non Af Amer: >90 mL/min (ref >90)
Glucose, Bld: 125 mg/dL — ABNORMAL HIGH (ref 70–99)
Total Bilirubin: 0.7 mg/dL (ref 0.3–1.2)

## 2012-06-19 LAB — MAGNESIUM: Magnesium: 2.1 mg/dL (ref 1.5–2.5)

## 2012-06-19 LAB — CBC
HCT: 29.9 % — ABNORMAL LOW (ref 39.0–52.0)
MCV: 81.9 fL (ref 78.0–100.0)
RDW: 15.8 % — ABNORMAL HIGH (ref 11.5–15.5)
WBC: 7.3 10*3/uL (ref 4.0–10.5)

## 2012-06-19 LAB — GLUCOSE, CAPILLARY
Glucose-Capillary: 124 mg/dL — ABNORMAL HIGH (ref 70–99)
Glucose-Capillary: 118 mg/dL — ABNORMAL HIGH (ref 70–99)

## 2012-06-19 LAB — PHOSPHORUS: Phosphorus: 3.3 mg/dL (ref 2.3–4.6)

## 2012-06-19 MED ORDER — CLINIMIX E/DEXTROSE (5/20) 5 % IV SOLN
INTRAVENOUS | Status: AC
  Administered 2012-06-19: 18:00:00 via INTRAVENOUS
  Filled 2012-06-19: qty 2000

## 2012-06-19 MED ORDER — OXYCODONE-ACETAMINOPHEN 5-325 MG PO TABS
1.0000 | ORAL_TABLET | ORAL | Status: DC | PRN
  Administered 2012-06-24 (×2): 1 via ORAL
  Filled 2012-06-19 (×2): qty 1

## 2012-06-19 MED ORDER — POTASSIUM CHLORIDE CRYS ER 20 MEQ PO TBCR
20.0000 meq | EXTENDED_RELEASE_TABLET | Freq: Two times a day (BID) | ORAL | Status: AC
  Administered 2012-06-19 – 2012-06-21 (×4): 20 meq via ORAL
  Filled 2012-06-19 (×6): qty 1

## 2012-06-19 MED ORDER — GUAIFENESIN-DM 100-10 MG/5ML PO SYRP
10.0000 mL | ORAL_SOLUTION | ORAL | Status: DC
  Administered 2012-06-19 – 2012-06-25 (×17): 10 mL via ORAL
  Filled 2012-06-19 (×2): qty 10

## 2012-06-19 MED ORDER — GUAIFENESIN-DM 100-10 MG/5ML PO SYRP
10.0000 mL | ORAL_SOLUTION | ORAL | Status: DC
  Filled 2012-06-19 (×44): qty 10

## 2012-06-19 NOTE — Progress Notes (Signed)
ANTIBIOTIC CONSULT NOTE - FOLLOW UP  Pharmacy Consult for Zosyn Indication: presumed HCAP  No Known Allergies  Patient Measurements: Height: 5\' 6"  (167.6 cm) Weight: 147 lb 7.8 oz (66.9 kg) IBW/kg (Calculated) : 63.8   Vital Signs: Temp: 99.1 F (37.3 C) (10/31 0602) Temp src: Oral (10/31 0602) BP: 154/73 mmHg (10/31 0602) Pulse Rate: 68  (10/31 0602)  Labs:  Basename 06/19/12 0420 06/17/12 0424  WBC 7.3 9.9  HGB 10.0* 9.4*  PLT 314 253  LABCREA -- --  CREATININE 0.81 0.78   Estimated Creatinine Clearance: 82 ml/min (by C-G formula based on Cr of 0.81).  Dose changes/drug level info:  10/25 VT = 13.6 on 1g q12h - Inc to 1250mg  q12h 10/27 VT = 13.4 on 1250mg  q12h  Assessment: 66 yom with malignant stricture of the common bile duct s/p Whipple procedure and placement of pancreatic stent on 10/18.  10/22 C-xray with bibasilar peribronchial thickening and patchy opacities, L > R, cannot r/o pneumonia. Surgery started Vanc/Zosyn for presumed HCAP given recent admission and home TNA. Pt now in ICU with AMS, chest CT shows RLL.  Day#10 Zosyn, Completed 7 days of vancomycin  Afebrile, WBC WNL  Renal function stable with CrCl 83 (N 92).  MRSA PCR negative on 10/15, no other cultures obtained.   Goal of Therapy:  Appropriate renal dosing of Zosyn  Plan:  1) Continue current Zosyn dosing 2) What is plan for length of therapy with Zosyn at this point?   Hessie Knows, PharmD, BCPS Pager [phone] 06/19/2012 10:11 AM

## 2012-06-19 NOTE — Progress Notes (Signed)
Subjective:  Feeling better. + cough. T max 99.7  Objective:  Vital Signs in the last 24 hours: Temp:  [98.5 F (36.9 C)-99.7 F (37.6 C)] 99.1 F (37.3 C) (10/31 0602) Pulse Rate:  [62-74] 68  (10/31 0602) Cardiac Rhythm:  [-] Normal sinus rhythm (10/30 1600) Resp:  [20-29] 22  (10/31 0602) BP: (131-173)/(51-102) 154/73 mmHg (10/31 0602) SpO2:  [59 %-99 %] 99 % (10/31 0602)  Physical Exam: BP Readings from Last 1 Encounters:  06/19/12 154/73    Wt Readings from Last 1 Encounters:  06/18/12 66.9 kg (147 lb 7.8 oz)    Weight change:   HEENT: [place]/AT, Eyes-Brown, PERL, EOMI, Conjunctiva-Pale pink, Sclera-Non-icteric Neck: No JVD, No bruit, Trachea midline. Lungs:  Scattered crackles, Bilateral. Cardiac:  Regular rhythm, normal S1 and S2, no S3.  Abdomen:  Soft, non-tender. Extremities:  No edema present. No cyanosis. No clubbing. Binder and drains. CNS: AxOx3, Cranial nerves grossly intact, moves all 4 extremities. Right handed. Skin: Warm and dry.   Intake/Output from previous day: 10/30 0701 - 10/31 0700 In: 1862.5 [P.O.:200; I.V.:600; IV Piggyback:87.5; TPN:960] Out: 855 [Urine:850; Drains:5]    Lab Results: BMET    Component Value Date/Time   NA 133* 06/19/2012 0420   K 3.4* 06/19/2012 0420   CL 102 06/19/2012 0420   CO2 20 06/19/2012 0420   GLUCOSE 125* 06/19/2012 0420   BUN 12 06/19/2012 0420   CREATININE 0.81 06/19/2012 0420   CALCIUM 7.9* 06/19/2012 0420   GFRNONAA >90 06/19/2012 0420   GFRAA >90 06/19/2012 0420   CBC    Component Value Date/Time   WBC 7.3 06/19/2012 0420   RBC 3.65* 06/19/2012 0420   HGB 10.0* 06/19/2012 0420   HCT 29.9* 06/19/2012 0420   PLT 314 06/19/2012 0420   MCV 81.9 06/19/2012 0420   MCH 27.4 06/19/2012 0420   MCHC 33.4 06/19/2012 0420   RDW 15.8* 06/19/2012 0420   LYMPHSABS 1.9 06/16/2012 0630   MONOABS 1.4* 06/16/2012 0630   EOSABS 0.7 06/16/2012 0630   BASOSABS 0.0 06/16/2012 0630   CARDIAC ENZYMES Lab Results    Component Value Date   TROPONINI <0.30 06/15/2012    Assessment/Plan:  Patient Active Hospital Problem List:  Altered mental state (06/15/2012) Peritoneal fluid collection (06/16/2012) -S/P drain  Pneumonia (06/16/2012)  -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  Diarrhea-improving  Add cough syrup Increase diet and activity   LOS: 13 days    Orpah Cobb  MD  06/19/2012, 9:40 AM

## 2012-06-19 NOTE — Progress Notes (Signed)
PARENTERAL NUTRITION CONSULT NOTE - FOLLOW UP  Pharmacy Consult for TNA Indication: bowel rest s/p Whipple procedure  No Known Allergies  Patient Measurements: Height: 5\' 6"  (167.6 cm) Weight: 147 lb 7.8 oz (66.9 kg) IBW/kg (Calculated) : 63.8   Vital Signs: Temp: 99.1 F (37.3 C) (10/31 0602) Temp src: Oral (10/31 0602) BP: 154/73 mmHg (10/31 0602) Pulse Rate: 68  (10/31 0602) Intake/Output from previous day: 10/30 0701 - 10/31 0700 In: 1862.5 [P.O.:200; I.V.:600; IV Piggyback:87.5; TPN:960] Out: 855 [Urine:850; Drains:5]  Labs:  [facility] 06/19/12 0420 06/17/12 0424  WBC 7.3 9.9  HGB 10.0* 9.4*  HCT 29.9* 28.0*  PLT 314 253  APTT -- --  INR -- --     [facility] 06/19/12 0420 06/17/12 0424  NA 133* 135  K 3.4* 3.6  CL 102 105  CO2 20 19  GLUCOSE 125* 126*  BUN 12 15  CREATININE 0.81 0.78  LABCREA -- --  CREAT24HRUR -- --  CALCIUM 7.9* 7.8*  MG 2.1 --  PHOS 3.3 --  PROT 5.7* 5.5*  ALBUMIN 2.0* 1.8*  AST 22 30  ALT 53 65*  ALKPHOS 141* 150*  BILITOT 0.7 0.9  BILIDIR -- --  IBILI -- --  PREALBUMIN -- --  TRIG 157* --  CHOLHDL -- --  CHOL -- --  10/31 Corrected Ca 9.5  Estimated Creatinine Clearance: 82 ml/min (by C-G formula based on Cr of 0.81).    Basename 06/19/12 0323 06/19/12 0002 06/18/12 1927  GLUCAP 124* 127* 118*   CBGs & Insulin requirements past 24 hours: 0 units SSI resistant scale   Nutritional Goals:  Per RD recs 10/25: 1800-2100 Kcal/d and 95-105g protein/d. Clinimix E 5/20 at 80 ml/hr + IVFE 20% at 31ml/hr MWF to provide: 96 g/day protein and 2169 Kcal/day MWF, 1689 Kcal/day STTHS (Avg. 1869 Kcal/day weekly).  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 TNA was started inpatient on 9/21  and pt discharged 10/3 on TNA with eventual plan for surgery after nutritional status improved. Pt underwent Whipple procedure and placement of pancreatic stent on 10/18. Pharmacy asked to start TNA on 10/19. Home TNA per [facility] was: over 16hrs daily. 105g protein. No lipids TWThSS: 1439Kcal. With lipids MF: 2651Kcal  for avg. 1785Kcal/d.   Was advanced to FLD on 10/26, but changed back to NPO on 10/28.  Peritoneal fluid collection drained by IR (10/28).  CLD resumed 10/29 and advanced to fat mod on 10/30  Labs (from 10/29) Renal: Scr wnl, stable Hepatic function: Alk phos slightly elevated, but decreasing and AST/ALT now WNL Electrolytes:  Na stable (oral supplement d/c 10/27), K slightly low - Md ordered PO replacement today Pre-Albumin: 20 (10/19), 10.2 (10/21), 9.3 (10/28) TG/Cholesterol: 157 on 10/31 CBGs: at goal range, cont SSI  Plan:    Continue TNA with Clinimix E 5/20 @ goal rate of 80 ml/hr   TNA to contain IV fat emulsion, standard multivitamins and trace elements only on MWF only due to ongoing shortage  TNA labs Monday/Thursdays, TG on Thursday  Pharmacy will f/u daily  Patient not tolerating diet yet - potential wean of TNA after toleration?   Hessie Knows, PharmD, BCPS Pager [phone] 06/19/2012 10:03 AM

## 2012-06-19 NOTE — Progress Notes (Signed)
Nutrition Follow-up  Intervention: Continue to recommend MD consider appetite stimulant as pt with ongoing minimal intake despite nutritional supplementation and meals encouraged by staff. TPN per pharmacy. Will monitor.   Diet Order: Fat modified  TPN: Clinimix E 5/20 @ 80 ml/hr. Lipids (20% IVFE @ 10 ml/hr), multivitamins, and trace elements are provided 3 times weekly (MWF) due to national backorder. Provides 1896 kcal and 96 grams protein daily (based on weekly average). Meets 105% minimum estimated kcal and 101% minimum estimated protein needs.  Additional IVF with NS @ 20 ml/hr.  - Noted events of 10/27 with transfer to ICU. Pt had drain placed in abdominal fluid collection 10/28. Noted pt with diarrhea yesterday. Met with pt, son, and wife today. They report pt continues to eat small amounts of food and keeps shaking his head "no" if you suggest other foods or supplements for pt to try. Nursing has continued to encourage meal and supplement intake, however pt continues to eat minimally.   - Pt with slightly low potassium today - CBGs controlled - Alk phos elevated but trending down slightly - PALB relatively stable from 10.2 mg/dL on 16/10 and 9.3 mg/dL on 96/04  Meds: Scheduled Meds:   . antiseptic oral rinse  15 mL Mouth Rinse BID  . enoxaparin  40 mg Subcutaneous Q24H  . feeding supplement  237 mL Oral BID BM  . guaiFENesin-dextromethorphan  10 mL Oral Q4H while awake  . insulin aspart  0-20 Units Subcutaneous Q4H  . lipase/protease/amylase  2 capsule Oral TID AC  . metoprolol  5 mg Intravenous Q6H  . pantoprazole (PROTONIX) IV  40 mg Intravenous QHS  . piperacillin-tazobactam (ZOSYN)  IV  3.375 g Intravenous Q8H  . potassium chloride  20 mEq Oral BID  . DISCONTD: guaiFENesin-dextromethorphan  10 mL Oral Q4H while awake   Continuous Infusions:   . sodium chloride 20 mL/hr at 06/19/12 1015  . fat emulsion 250 mL (06/18/12 1900)  . TPN (CLINIMIX) +/- additives 80 mL/hr at  06/17/12 1800  . TPN (CLINIMIX) +/- additives 80 mL/hr at 06/18/12 1740  . TPN (CLINIMIX) +/- additives     PRN Meds:.fentaNYL, hydrALAZINE, ondansetron (ZOFRAN) IV, ondansetron, oxyCODONE-acetaminophen, QUEtiapine   CMP     Component Value Date/Time   NA 133* 06/19/2012 0420   K 3.4* 06/19/2012 0420   CL 102 06/19/2012 0420   CO2 20 06/19/2012 0420   GLUCOSE 125* 06/19/2012 0420   BUN 12 06/19/2012 0420   CREATININE 0.81 06/19/2012 0420   CALCIUM 7.9* 06/19/2012 0420   PROT 5.7* 06/19/2012 0420   ALBUMIN 2.0* 06/19/2012 0420   AST 22 06/19/2012 0420   ALT 53 06/19/2012 0420   ALKPHOS 141* 06/19/2012 0420   BILITOT 0.7 06/19/2012 0420   GFRNONAA >90 06/19/2012 0420   GFRAA >90 06/19/2012 0420    CBG (last 3)   Basename 06/19/12 1158 06/19/12 0323 06/19/12 0002  GLUCAP 144* 124* 127*     Intake/Output Summary (Last 24 hours) at 06/19/12 1613 Last data filed at 06/19/12 1400  Gross per 24 hour  Intake   3235 ml  Output    965 ml  Net   2270 ml   Last BM - 10/30  Weight Status:   10/20 153 lb 3.5 oz 10/30 147 lb 7.8 oz  Estimated needs:   1800-2100 calories 95-105g protein  Nutrition Dx: Inadequate oral intake - ongoing  Goal: TPN + lipids to meet 100% of estimated needs  - met.   New  goal: Pt to consume >50% of meals/supplements - not met.   Monitor: Weights, labs, intake, TPN, appetite   Levon Hedger MS, RD, LDN [phone] Pager [phone] After Hours Pager

## 2012-06-19 NOTE — Progress Notes (Signed)
Physical Therapy Treatment Patient Details Name: Lowery Vlasic MRN: [id number] DOB: September 01, 1945 Today's Date: 06/19/2012 Time: 3086-5784 PT Time Calculation (min): 11 min  PT Assessment / Plan / Recommendation Comments on Treatment Session  Pt somewhat impulsive with movement.  He needs the RW for safety and stability.  Dyspnea on exertion , but overal improving    Follow Up Recommendations        Does the patient have the potential to tolerate intense rehabilitation     Barriers to Discharge        Equipment Recommendations       Recommendations for Other Services    Frequency     Plan Discharge plan remains appropriate;Frequency remains appropriate    Precautions / Restrictions     Pertinent Vitals/Pain Minor c/o pain at abdomen    Mobility  Bed Mobility Bed Mobility: Rolling Right;Right Sidelying to Sit Rolling Right: 5: Supervision Right Sidelying to Sit: 4: Min assist Supine to Sit: With rails;4: Min assist Sit to Supine: 4: Min assist Details for Bed Mobility Assistance: VC to roll to protect abdomen Transfers Transfers: Sit to Stand;Stand to Sit Sit to Stand: From bed;With upper extremity assist;4: Min guard Stand to Sit: 4: Min guard;With upper extremity assist;With armrests Details for Transfer Assistance: multimodal cues for hand placment to push up with arms Ambulation/Gait Ambulation/Gait Assistance: 3: Mod assist;4: Min assist Ambulation Distance (Feet): 400 Feet Assistive device: Rolling walker Ambulation/Gait Assistance Details: multiple standing rest breaks.  Pt mildly dyspneic, but able to continue.  Cues to slow down as pt tends to be impulsive Gait Pattern: Step-through pattern Gait velocity: tends to move quickly General Gait Details: pt with improved tolerance of gait Stairs: No Wheelchair Mobility Wheelchair Mobility: No    Exercises     PT Diagnosis:    PT Problem List:   PT Treatment Interventions:     PT Goals Acute Rehab PT  Goals Pt will go Supine/Side to Sit: with supervision PT Goal: Supine/Side to Sit - Progress: Progressing toward goal PT Goal: Sit to Supine/Side - Progress: Progressing toward goal Pt will go Sit to Stand: with supervision PT Goal: Sit to Stand - Progress: Progressing toward goal Pt will Ambulate: >150 feet;with supervision;with least restrictive assistive device PT Goal: Ambulate - Progress: Progressing toward goal  Visit Information  Last PT Received On: 06/19/12 Assistance Needed: +2 (+2 needed for IV pole and lines management)    Subjective Data  Subjective: pt smiling, agreeable to walk Patient Stated Goal: home   Cognition  Overall Cognitive Status: Appears within functional limits for tasks assessed/performed Arousal/Alertness: Awake/alert Behavior During Session: [facility] for tasks performed    Balance     End of Session PT - End of Session Activity Tolerance: Patient tolerated treatment well Patient left: in bed;with call bell/phone within reach   GP     Teresa K. [place], [place] [phone] 06/19/2012, 2:55 PM

## 2012-06-19 NOTE — Progress Notes (Signed)
Patient ID: Colin Castro, male   DOB: [birth year], 66 y.o.   MRN: [id number] 13 Days Post-Op  Subjective: Mental status remains good.  No nausea.   Objective: Vital signs in last 24 hours: Temp:  [98.5 F (36.9 C)-99.7 F (37.6 C)] 99.1 F (37.3 C) (10/31 0602) Pulse Rate:  [62-74] 68  (10/31 0602) Resp:  [20-29] 22  (10/31 0602) BP: (131-173)/(51-102) 154/73 mmHg (10/31 0602) SpO2:  [59 %-99 %] 99 % (10/31 0602) Last BM Date: 06/18/12  Intake/Output from previous day: 10/30 0701 - 10/31 0700 In: 1862.5 [P.O.:200; I.V.:600; IV Piggyback:87.5; TPN:960] Out: 855 [Urine:850; Drains:5] Intake/Output this shift:    Resp: clear to auscultation bilaterally Cardio: regular rate and rhythm GI: soft, minimal tenderness. good bs and flatus. incision looks good Drain  serosang.    Lab Results:   Basename 06/19/12 0420 06/17/12 0424  WBC 7.3 9.9  HGB 10.0* 9.4*  HCT 29.9* 28.0*  PLT 314 253   BMET  Basename 06/19/12 0420 06/17/12 0424  NA 133* 135  K 3.4* 3.6  CL 102 105  CO2 20 19  GLUCOSE 125* 126*  BUN 12 15  CREATININE 0.81 0.78  CALCIUM 7.9* 7.8*   PT/INR No results found for this basename: LABPROT:2,INR:2 in the last 72 hours ABG No results found for this basename: PHART:2,PCO2:2,PO2:2,HCO3:2 in the last 72 hours  Studies/Results: No results found.  Anti-infectives: Anti-infectives     Start     Dose/Rate Route Frequency Ordered Stop   06/16/12 0200   vancomycin (VANCOCIN) IVPB 1000 mg/200 mL premix  Status:  Discontinued        1,000 mg 200 mL/hr over 60 Minutes Intravenous Every 8 hours 06/16/12 0137 06/17/12 0929   06/13/12 2359   vancomycin (VANCOCIN) 1,250 mg in sodium chloride 0.9 % 250 mL IVPB  Status:  Discontinued        1,250 mg 166.7 mL/hr over 90 Minutes Intravenous Every 12 hours 06/13/12 2335 06/16/12 0137   06/11/12 1000   vancomycin (VANCOCIN) IVPB 1000 mg/200 mL premix  Status:  Discontinued        1,000 mg 200 mL/hr over 60 Minutes  Intravenous Every 12 hours 06/11/12 0924 06/13/12 2334   06/10/12 1000   piperacillin-tazobactam (ZOSYN) IVPB 3.375 g        3.375 g 12.5 mL/hr over 240 Minutes Intravenous Every 8 hours 06/10/12 0848     06/06/12 1800   ertapenem (INVANZ) 1 g in sodium chloride 0.9 % 50 mL IVPB        1 g 100 mL/hr over 30 Minutes Intravenous Every 24 hours 06/06/12 1740 06/06/12 1906   06/05/12 1433   ertapenem (INVANZ) 1 g in sodium chloride 0.9 % 50 mL IVPB        1 g 100 mL/hr over 30 Minutes Intravenous 60 min pre-op 06/05/12 1433 06/06/12 1030          Assessment/Plan: s/p Procedure(s) (LRB) with comments: WHIPPLE PROCEDURE (N/A) - Pancreatico duodenectomy LYSIS OF ADHESION (N/A) CHOLECYSTECTOMY (N/A) PANCREATIC STENT PLACEMENT (N/A) - Removal of biliary stent; Placement of pacreatic stent LYMPH NODE DISSECTION (N/A) - Portal lymph node dissection  Zosyn for pneumonia.   Await drain cultures. Drains negative for amylase  Diet as tolerated.    Add creon for panc insufficiency      LOS: 13 days    Prem Coykendall 06/19/2012

## 2012-06-20 LAB — COMPREHENSIVE METABOLIC PANEL WITH GFR
ALT: 47 U/L (ref 0–53)
AST: 19 U/L (ref 0–37)
Albumin: 2.1 g/dL — ABNORMAL LOW (ref 3.5–5.2)
Alkaline Phosphatase: 139 U/L — ABNORMAL HIGH (ref 39–117)
BUN: 14 mg/dL (ref 6–23)
CO2: 19 meq/L (ref 19–32)
Calcium: 8 mg/dL — ABNORMAL LOW (ref 8.4–10.5)
Chloride: 102 meq/L (ref 96–112)
Creatinine, Ser: 0.76 mg/dL (ref 0.50–1.35)
GFR calc Af Amer: >90 mL/min
GFR calc non Af Amer: >90 mL/min
Glucose, Bld: 120 mg/dL — ABNORMAL HIGH (ref 70–99)
Potassium: 3.7 meq/L (ref 3.5–5.1)
Sodium: 131 meq/L — ABNORMAL LOW (ref 135–145)
Total Bilirubin: 0.8 mg/dL (ref 0.3–1.2)
Total Protein: 6 g/dL (ref 6.0–8.3)

## 2012-06-20 LAB — BODY FLUID CULTURE

## 2012-06-20 LAB — GLUCOSE, CAPILLARY
Glucose-Capillary: 107 mg/dL — ABNORMAL HIGH (ref 70–99)
Glucose-Capillary: 127 mg/dL — ABNORMAL HIGH (ref 70–99)
Glucose-Capillary: 118 mg/dL — ABNORMAL HIGH (ref 70–99)
Glucose-Capillary: 106 mg/dL — ABNORMAL HIGH (ref 70–99)

## 2012-06-20 MED ORDER — MEGESTROL ACETATE 400 MG/10ML PO SUSP
400.0000 mg | Freq: Two times a day (BID) | ORAL | Status: DC
  Administered 2012-06-20 – 2012-06-25 (×11): 400 mg via ORAL
  Filled 2012-06-20 (×12): qty 10

## 2012-06-20 MED ORDER — TRACE MINERALS CR-CU-F-FE-I-MN-MO-SE-ZN IV SOLN
INTRAVENOUS | Status: AC
  Administered 2012-06-20: 17:00:00 via INTRAVENOUS
  Filled 2012-06-20: qty 1000

## 2012-06-20 MED ORDER — SODIUM CHLORIDE 0.9 % IJ SOLN
10.0000 mL | INTRAMUSCULAR | Status: DC | PRN
  Administered 2012-06-20 – 2012-06-24 (×3): 10 mL

## 2012-06-20 MED ORDER — FAT EMULSION 20 % IV EMUL
250.0000 mL | INTRAVENOUS | Status: AC
  Administered 2012-06-20: 250 mL via INTRAVENOUS
  Filled 2012-06-20: qty 250

## 2012-06-20 NOTE — Progress Notes (Signed)
14 Days Post-Op  Subjective: Pt doing better; no new c/o;more alert and talkative today  Objective: Vital signs in last 24 hours: Temp:  [97.7 F (36.5 C)-98.7 F (37.1 C)] 98.7 F (37.1 C) (11/01 0600) Pulse Rate:  [66-70] 68  (11/01 0600) Resp:  [19-23] 23  (11/01 0600) BP: (121-167)/(55-77) 121/77 mmHg (11/01 0600) SpO2:  [96 %-98 %] 96 % (11/01 0600) Last BM Date: 06/18/12  Intake/Output from previous day: 10/31 0701 - 11/01 0700 In: 3247.5 [I.V.:917.5; TPN:2330] Out: 1065 [Urine:950; Emesis/NG output:100; Drains:15] Intake/Output this shift:    Left ant abd drain intact, output 15 cc's tea colored fluid; cx's- enterococcus  Lab Results:   [facility] 06/19/12 0420  WBC 7.3  HGB 10.0*  HCT 29.9*  PLT 314   BMET  Basename 06/20/12 0438 06/19/12 0420  NA 131* 133*  K 3.7 3.4*  CL 102 102  CO2 19 20  GLUCOSE 120* 125*  BUN 14 12  CREATININE 0.76 0.81  CALCIUM 8.0* 7.9*   PT/INR No results found for this basename: LABPROT:2,INR:2 in the last 72 hours ABG No results found for this basename: PHART:2,PCO2:2,PO2:2,HCO3:2 in the last 72 hours Results for orders placed during the hospital encounter of 06/06/12  BODY FLUID CULTURE     Status: Normal   Collection Time   06/16/12  1:40 PM      Component Value Range Status Comment   Specimen Description     Final    Value: FLUID PERITONEAL CT GUIDED DRAINAGE CATHETER IN ANTERIOR ABDOMINAL FLUID COLLECTION   Special Requests NONE   Final    Gram Stain     Final    Value: FEW WBC PRESENT, PREDOMINANTLY MONONUCLEAR     NO ORGANISMS SEEN   Culture RARE ENTEROCOCCUS SPECIES   Final    Report Status 06/20/2012 FINAL   Final    Organism ID, Bacteria ENTEROCOCCUS SPECIES   Final   CLOSTRIDIUM DIFFICILE BY PCR     Status: Normal   Collection Time   06/18/12  1:58 PM      Component Value Range Status Comment   C difficile by pcr NEGATIVE  NEGATIVE Final     Studies/Results: No results  found.  Anti-infectives: Anti-infectives     Start     Dose/Rate Route Frequency Ordered Stop   06/16/12 0200   vancomycin (VANCOCIN) IVPB 1000 mg/200 mL premix  Status:  Discontinued        1,000 mg 200 mL/hr over 60 Minutes Intravenous Every 8 hours 06/16/12 0137 06/17/12 0929   06/13/12 2359   vancomycin (VANCOCIN) 1,250 mg in sodium chloride 0.9 % 250 mL IVPB  Status:  Discontinued        1,250 mg 166.7 mL/hr over 90 Minutes Intravenous Every 12 hours 06/13/12 2335 06/16/12 0137   06/11/12 1000   vancomycin (VANCOCIN) IVPB 1000 mg/200 mL premix  Status:  Discontinued        1,000 mg 200 mL/hr over 60 Minutes Intravenous Every 12 hours 06/11/12 0924 06/13/12 2334   06/10/12 1000   piperacillin-tazobactam (ZOSYN) IVPB 3.375 g        3.375 g 12.5 mL/hr over 240 Minutes Intravenous Every 8 hours 06/10/12 0848     06/06/12 1800   ertapenem (INVANZ) 1 g in sodium chloride 0.9 % 50 mL IVPB        1 g 100 mL/hr over 30 Minutes Intravenous Every 24 hours 06/06/12 1740 06/06/12 1906   06/05/12 1433   ertapenem (INVANZ) 1  g in sodium chloride 0.9 % 50 mL IVPB        1 g 100 mL/hr over 30 Minutes Intravenous 60 min pre-op 06/05/12 1433 06/06/12 1030          Assessment/Plan: s/p left ant abd abscess drainage 10/28; plans as per [facility].   LOS: 14 days    Addy Mcmannis,D [facility] 06/20/2012

## 2012-06-20 NOTE — Progress Notes (Signed)
Nutrition Brief Note  Calorie count envelope hung on pt's door and instructions given to nursing. Assisted pt with ordering lunch. RD to monitor and analyze calorie count.   Levon Hedger MS, RD, LDN [phone] Pager [phone] After Hours Pager

## 2012-06-20 NOTE — Progress Notes (Signed)
PARENTERAL NUTRITION CONSULT NOTE - FOLLOW UP  Pharmacy Consult for TNA Indication: bowel rest s/p Whipple procedure  No Known Allergies  Patient Measurements: Height: 5\' 6"  (167.6 cm) Weight: 147 lb 7.8 oz (66.9 kg) IBW/kg (Calculated) : 63.8   Vital Signs: Temp: 98.7 F (37.1 C) (11/01 0600) Temp src: Oral (11/01 0600) BP: 121/77 mmHg (11/01 0600) Pulse Rate: 68  (11/01 0600) Intake/Output from previous day: 10/31 0701 - 11/01 0700 In: 3247.5 [I.V.:917.5; TPN:2330] Out: 1065 [Urine:950; Emesis/NG output:100; Drains:15]  Labs:  [facility] 06/19/12 0420  WBC 7.3  HGB 10.0*  HCT 29.9*  PLT 314  APTT --  INR --     Basename 06/20/12 0438 06/19/12 0420  NA 131* 133*  K 3.7 3.4*  CL 102 102  CO2 19 20  GLUCOSE 120* 125*  BUN 14 12  CREATININE 0.76 0.81  LABCREA -- --  CREAT24HRUR -- --  CALCIUM 8.0* 7.9*  MG -- 2.1  PHOS -- 3.3  PROT 6.0 5.7*  ALBUMIN 2.1* 2.0*  AST 19 22  ALT 47 53  ALKPHOS 139* 141*  BILITOT 0.8 0.7  BILIDIR -- --  IBILI -- --  PREALBUMIN -- --  TRIG -- 157*  CHOLHDL -- --  CHOL -- --  11/1: Corrected Ca 9.5  Estimated Creatinine Clearance: 83.1 ml/min (by C-G formula based on Cr of 0.76).    Basename 06/20/12 0715 06/20/12 0402 06/19/12 2330  GLUCAP 127* 107* 118*   CBGs & Insulin requirements past 24 hours: CBGs 107-144 6 units SSI resistant scale   Nutritional Goals:  Per RD recs 10/25: 1800-2100 Kcal/d and 95-105g protein/d. Clinimix E 5/20 at 80 ml/hr + IVFE 20% at 45ml/hr MWF to provide: 96 g/day protein and 2169 Kcal/day MWF, 1689 Kcal/day STTHS (Avg. 1869 Kcal/day weekly).  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 TNA was started inpatient on 9/21 and pt discharged 10/3 on TNA with eventual plan  for surgery after nutritional status improved. Pt underwent Whipple procedure and placement of pancreatic stent on 10/18. Pharmacy asked to start TNA on 10/19. Home TNA per [facility] was: over 16hrs daily. 105g protein. No lipids TWThSS: 1439Kcal. With lipids MF: 2651Kcal  for avg. 1785Kcal/d.     Was advanced to FLD on 10/26, but changed back to NPO on 10/28.  Peritoneal fluid collection drained by IR (10/28).  CLD resumed 10/29 and advanced to fat mod on 10/30 Request from attending MD to reduce TNA rate by 1/2 noted (for appetite stimulation).  Megestrol was also added. Labs  Renal: Scr wnl, stable Hepatic function: Alk phos slightly elevated, but decreasing and AST/ALT now WNL Electrolytes:  Na slightly low (cannot adjust content in premixed TPN), others WNL Pre-Albumin: 20 (10/19), 10.2 (10/21), 9.3 (10/28) TG: 157 on 10/31 CBGs: at goal range, cont SSI  Plan:    With next bag,  Reduce Clinimix-E 5/20 to 40 mL/hr  Reduce Fat Emulsion 20% to 5 mL/hr  Administering IV fat emulsion, standard multivitamins and trace elements only on MWF only due to ongoing shortage  TNA labs Monday/Thursdays  Follow-up on adequacy of PO dietary intake   Elie Goody, PharmD, BCPS Pager: [phone] 06/20/2012  9:50 AM

## 2012-06-20 NOTE — Progress Notes (Signed)
Subjective:  Feeling better. Some oral intake. Decreasing abdominal pain. Afebrile.  Objective:  Vital Signs in the last 24 hours: Temp:  [97.9 F (36.6 C)-98.7 F (37.1 C)] 97.9 F (36.6 C) (11/01 1400) Pulse Rate:  [67-70] 70  (11/01 1400) Cardiac Rhythm:  [-]  Resp:  [18-23] 18  (11/01 1400) BP: (121-156)/(58-77) 153/70 mmHg (11/01 1400) SpO2:  [95 %-98 %] 95 % (11/01 1400)  Physical Exam: BP Readings from Last 1 Encounters:  06/20/12 153/70    Wt Readings from Last 1 Encounters:  06/18/12 66.9 kg (147 lb 7.8 oz)    Weight change:   HEENT: [place]/AT, Eyes-Brown, PERL, EOMI, Conjunctiva-Pale pink, Sclera-Non-icteric Neck: No JVD, No bruit, Trachea midline. Lungs:  Clearing, Bilateral. Cardiac:  Regular rhythm, normal S1 and S2, no S3.  Abdomen:  Soft, non-tender. Abdominal binder on. Extremities:  No edema present. No cyanosis. No clubbing. CNS: AxOx3, Cranial nerves grossly intact, moves all 4 extremities. Right handed. Skin: Warm and dry.   Intake/Output from previous day: 10/31 0701 - 11/01 0700 In: 3247.5 [I.V.:917.5; TPN:2330] Out: 1065 [Urine:950; Emesis/NG output:100; Drains:15]    Lab Results: BMET    Component Value Date/Time   NA 131* 06/20/2012 0438   K 3.7 06/20/2012 0438   CL 102 06/20/2012 0438   CO2 19 06/20/2012 0438   GLUCOSE 120* 06/20/2012 0438   BUN 14 06/20/2012 0438   CREATININE 0.76 06/20/2012 0438   CALCIUM 8.0* 06/20/2012 0438   GFRNONAA >90 06/20/2012 0438   GFRAA >90 06/20/2012 0438   CBC    Component Value Date/Time   WBC 7.3 06/19/2012 0420   RBC 3.65* 06/19/2012 0420   HGB 10.0* 06/19/2012 0420   HCT 29.9* 06/19/2012 0420   PLT 314 06/19/2012 0420   MCV 81.9 06/19/2012 0420   MCH 27.4 06/19/2012 0420   MCHC 33.4 06/19/2012 0420   RDW 15.8* 06/19/2012 0420   LYMPHSABS 1.9 06/16/2012 0630   MONOABS 1.4* 06/16/2012 0630   EOSABS 0.7 06/16/2012 0630   BASOSABS 0.0 06/16/2012 0630   CARDIAC ENZYMES Lab Results  Component Value Date    TROPONINI <0.30 06/15/2012    Assessment/Plan:  Patient Active Hospital Problem List:  Altered mental state (06/15/2012)   Assessment: Improved Peritoneal fluid collection (06/16/2012)  S/P drain Pneumonia (06/16/2012)  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  Diarrhea-improving Hyponatremia  Continue medical treatment.   LOS: 14 days    Orpah Cobb  MD  06/20/2012, 7:09 PM

## 2012-06-20 NOTE — Progress Notes (Signed)
Physical Therapy Treatment Patient Details Name: Colin Castro MRN: [id number] DOB: May 28, 1946 Today's Date: 06/20/2012 Time: 8469-6295 PT Time Calculation (min): 18 min  PT Assessment / Plan / Recommendation Comments on Treatment Session  Pt somewhat impulsive. Noted mild unsteadyness when amb w/o AD.  Pt plans to return home with his spouse.    Follow Up Recommendations  Home health PT;Supervision - Intermittent     Does the patient have the potential to tolerate intense rehabilitation     Barriers to Discharge        Equipment Recommendations  Rolling walker with 5" wheels    Recommendations for Other Services    Frequency Min 3X/week   Plan Discharge plan remains appropriate    Precautions / Restrictions     Pertinent Vitals/Pain No c/o pIn    Mobility  Bed Mobility Bed Mobility: Supine to Sit Supine to Sit: 5: Supervision Details for Bed Mobility Assistance: increased time  Transfers Transfers: Sit to Stand;Stand to Sit Sit to Stand: 4: Min assist;From bed Stand to Sit: 4: Min assist;To chair/3-in-1 Details for Transfer Assistance: <25% VC's on safety mainly for IV line  Ambulation/Gait Ambulation/Gait Assistance: 4: Min assist Ambulation Distance (Feet): 500 Feet Assistive device: Rolling walker Ambulation/Gait Assistance Details: 250' with RW at [employer] and 250' w/o AD @ Min Assist. Pt demon quick gait speed. No LOB demon slight unsteadyness w/o AD. Gait Pattern: Step-through pattern     PT Goals                       progressing    Visit Information  Last PT Received On: 06/20/12 Assistance Needed: +1                   End of Session PT - End of Session Equipment Utilized During Treatment: Gait belt Activity Tolerance: Patient tolerated treatment well Patient left: in chair;with call bell/phone within reach   Felecia Shelling  PTA [facility]  Acute  Rehab Pager     [phone]

## 2012-06-20 NOTE — Progress Notes (Signed)
Calorie Count Note  Intervention: TPN per pharmacy. Nursing to continue to encourage increased meal and supplement intake. RD to monitor and analyze calorie count from the weekend on Monday.   48 hour calorie count ordered.  Diet: Fat modified  Supplements: Ensure Complete BID  TPN: Clinimix E 5/20 @ 80 ml/hr. Lipids (20% IVFE @ 10 ml/hr), multivitamins, and trace elements are provided 3 times weekly (MWF) due to national backorder. Provides 1896 kcal and 96 grams protein daily (based on weekly average). Meets 105% minimum estimated kcal and 101% minimum estimated protein needs.  11/1 Breakfast: 0% Lunch: 150 calories, 8g protein Supplements: 1 Ensure Complete - 350 calories, 13g protein   Total intake: 500 kcal (28% of minimum estimated needs)  21g protein (22% of minimum estimated needs)  - Pt stated on Megace today. Noted plans to cut TPN in half tonight to increase appetite.   Nutrition Dx: Inadequate oral intake - ongoing  Goal:  1. TPN + lipids to meet 100% of estimated needs - met but will not meet with next bag.   2. Pt to consume >50% of meals/supplements - not met.   Monitor: Weights, labs, intake, TPN, appetite   Levon Hedger MS, RD, LDN  [phone] Pager  [phone] After Hours Pager

## 2012-06-20 NOTE — Progress Notes (Signed)
Patient ID: Colin Castro, male   DOB: 28-Mar-1946, 66 y.o.   MRN: [id number] 14 Days Post-Op  Subjective: Not very hungry, but tolerated some solid food.    Objective: Vital signs in last 24 hours: Temp:  [97.7 F (36.5 C)-98.7 F (37.1 C)] 98.7 F (37.1 C) (11/01 0600) Pulse Rate:  [66-70] 68  (11/01 0600) Resp:  [19-23] 23  (11/01 0600) BP: (121-167)/(55-77) 121/77 mmHg (11/01 0600) SpO2:  [96 %-98 %] 96 % (11/01 0600) Last BM Date: 06/18/12  Intake/Output from previous day: 10/31 0701 - 11/01 0700 In: 3247.5 [I.V.:917.5; TPN:2330] Out: 1065 [Urine:950; Emesis/NG output:100; Drains:15] Intake/Output this shift:    Resp: clear to auscultation bilaterally Cardio: regular rate and rhythm GI: soft, minimal tenderness. good bs and flatus. incision looks good Drain  serosang.    Lab Results:   [facility] 06/19/12 0420  WBC 7.3  HGB 10.0*  HCT 29.9*  PLT 314   BMET  Basename 06/20/12 0438 06/19/12 0420  NA 131* 133*  K 3.7 3.4*  CL 102 102  CO2 19 20  GLUCOSE 120* 125*  BUN 14 12  CREATININE 0.76 0.81  CALCIUM 8.0* 7.9*   PT/INR No results found for this basename: LABPROT:2,INR:2 in the last 72 hours ABG No results found for this basename: PHART:2,PCO2:2,PO2:2,HCO3:2 in the last 72 hours  Studies/Results: No results found.  Anti-infectives: Anti-infectives     Start     Dose/Rate Route Frequency Ordered Stop   06/16/12 0200   vancomycin (VANCOCIN) IVPB 1000 mg/200 mL premix  Status:  Discontinued        1,000 mg 200 mL/hr over 60 Minutes Intravenous Every 8 hours 06/16/12 0137 06/17/12 0929   06/13/12 2359   vancomycin (VANCOCIN) 1,250 mg in sodium chloride 0.9 % 250 mL IVPB  Status:  Discontinued        1,250 mg 166.7 mL/hr over 90 Minutes Intravenous Every 12 hours 06/13/12 2335 06/16/12 0137   06/11/12 1000   vancomycin (VANCOCIN) IVPB 1000 mg/200 mL premix  Status:  Discontinued        1,000 mg 200 mL/hr over 60 Minutes Intravenous Every 12 hours  06/11/12 0924 06/13/12 2334   06/10/12 1000   piperacillin-tazobactam (ZOSYN) IVPB 3.375 g        3.375 g 12.5 mL/hr over 240 Minutes Intravenous Every 8 hours 06/10/12 0848     06/06/12 1800   ertapenem (INVANZ) 1 g in sodium chloride 0.9 % 50 mL IVPB        1 g 100 mL/hr over 30 Minutes Intravenous Every 24 hours 06/06/12 1740 06/06/12 1906   06/05/12 1433   ertapenem (INVANZ) 1 g in sodium chloride 0.9 % 50 mL IVPB        1 g 100 mL/hr over 30 Minutes Intravenous 60 min pre-op 06/05/12 1433 06/06/12 1030          Assessment/Plan: s/p Procedure(s) (LRB) with comments: WHIPPLE PROCEDURE (N/A) - Pancreatico duodenectomy LYSIS OF ADHESION (N/A) CHOLECYSTECTOMY (N/A) PANCREATIC STENT PLACEMENT (N/A) - Removal of biliary stent; Placement of pacreatic stent LYMPH NODE DISSECTION (N/A) - Portal lymph node dissection  Zosyn for pneumonia.  D/C soon. Await drain cultures. Add megace for appetite stimulation.    Diet as tolerated.    Creon      LOS: 14 days    Colin Castro 06/20/2012

## 2012-06-21 LAB — GLUCOSE, CAPILLARY
Glucose-Capillary: 109 mg/dL — ABNORMAL HIGH (ref 70–99)
Glucose-Capillary: 109 mg/dL — ABNORMAL HIGH (ref 70–99)
Glucose-Capillary: 116 mg/dL — ABNORMAL HIGH (ref 70–99)
Glucose-Capillary: 116 mg/dL — ABNORMAL HIGH (ref 70–99)
Glucose-Capillary: 135 mg/dL — ABNORMAL HIGH (ref 70–99)

## 2012-06-21 MED ORDER — CLINIMIX E/DEXTROSE (5/20) 5 % IV SOLN
INTRAVENOUS | Status: AC
  Administered 2012-06-21: 18:00:00 via INTRAVENOUS
  Filled 2012-06-21: qty 1000

## 2012-06-21 NOTE — Progress Notes (Signed)
PARENTERAL NUTRITION CONSULT NOTE - FOLLOW UP  Pharmacy Consult for TNA Indication: bowel rest s/p Whipple procedure  No Known Allergies  Patient Measurements: Height: 5\' 6"  (167.6 cm) Weight: 147 lb 7.8 oz (66.9 kg) IBW/kg (Calculated) : 63.8   Vital Signs: Temp: 97.9 F (36.6 C) (11/02 0605) Temp src: Oral (11/02 0605) BP: 156/68 mmHg (11/02 0605) Pulse Rate: 68  (11/02 0605) Intake/Output from previous day: 11/01 0701 - 11/02 0700 In: 1220 [P.O.:360; I.V.:800; IV Piggyback:60] Out: 2260 [Urine:2200; Drains:60]  Labs:  [facility] 06/19/12 0420  WBC 7.3  HGB 10.0*  HCT 29.9*  PLT 314  APTT --  INR --     Basename 06/20/12 0438 06/19/12 0420  NA 131* 133*  K 3.7 3.4*  CL 102 102  CO2 19 20  GLUCOSE 120* 125*  BUN 14 12  CREATININE 0.76 0.81  LABCREA -- --  CREAT24HRUR -- --  CALCIUM 8.0* 7.9*  MG -- 2.1  PHOS -- 3.3  PROT 6.0 5.7*  ALBUMIN 2.1* 2.0*  AST 19 22  ALT 47 53  ALKPHOS 139* 141*  BILITOT 0.8 0.7  BILIDIR -- --  IBILI -- --  PREALBUMIN -- --  TRIG -- 157*  CHOLHDL -- --  CHOL -- --  11/1: Corrected Ca 9.5  Estimated Creatinine Clearance: 83.1 ml/min (by C-G formula based on Cr of 0.76).    Basename 06/21/12 0357 06/21/12 0013 06/20/12 2001  GLUCAP 110* 117* 106*   CBGs & Insulin requirements past 24 hours: CBGs 106-142 3 units SSI resistant scale   Nutritional Goals:  Per RD recs 10/25: 1800-2100 Kcal/d and 95-105g protein/d. Clinimix E 5/20 at 80 ml/hr + IVFE 20% at 32ml/hr MWF to provide: 96 g/day protein and 2169 Kcal/day MWF, 1689 Kcal/day STTHS (Avg. 1869 Kcal/day weekly).  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 TNA was started inpatient on 9/21 and pt discharged 10/3 on  TNA with eventual plan for surgery after nutritional status improved. Pt underwent Whipple procedure and placement of pancreatic stent on 10/18. Pharmacy asked to start TNA on 10/19. Home TNA per [facility] was: over 16hrs daily. 105g protein. No lipids TWThSS: 1439Kcal. With lipids MF: 2651Kcal  for avg. 1785Kcal/d.     Was advanced to FLD on 10/26, but changed back to NPO on 10/28.  Peritoneal fluid collection drained by IR (10/28).  CLD resumed 10/29 and advanced to fat mod on 10/30  Request from attending MD to reduce TNA rate by 1/2 noted (for appetite stimulation).  Megestrol was also added.  Per RN, patient's appetite is fair to poor.  He is eating a little of the food his family brings in for him, but is not fond of the food provided by the hospital.  Labs (from 11/1) Renal: Scr wnl, stable Hepatic function: Alk phos slightly elevated, but decreasing and AST/ALT now WNL Electrolytes:  Na slightly low (cannot adjust content in premixed TPN), others WNL Pre-Albumin: 20 (10/19), 10.2 (10/21), 9.3 (10/28) TG: 157 on 10/31 CBGs: at goal range, cont SSI  Plan:    Continue reduce Clinimix-E 5/20 @ 40 mL/hr  Reduce Fat Emulsion 20% to 5 mL/hr on MWF  Administering IV fat emulsion, standard multivitamins and trace elements only  on MWF only due to ongoing shortage  TNA labs Monday/Thursdays  Follow-up on adequacy of PO dietary intake  Shelena Castelluccio, Loma Messing PharmD Pager #: [phone] 8:02 AM 06/21/2012

## 2012-06-21 NOTE — Progress Notes (Signed)
Patient ID: Colin Castro, male   DOB: [birth year], 66 y.o.   MRN: [id number] [facility] Surgery Progress Note:   15 Days Post-Op  Subjective: Mental status is clear.  Listening to [place] radio over the internet.   Objective: Vital signs in last 24 hours: Temp:  [97.9 F (36.6 C)-98.5 F (36.9 C)] 97.9 F (36.6 C) (11/02 0605) Pulse Rate:  [67-70] 68  (11/02 0605) Resp:  [18-20] 20  (11/02 0605) BP: (148-156)/(68-76) 156/68 mmHg (11/02 0605) SpO2:  [95 %] 95 % (11/02 0605)  Intake/Output from previous day: 11/01 0701 - 11/02 0700 In: 1220 [P.O.:360; I.V.:800; IV Piggyback:60] Out: 2260 [Urine:2200; Drains:60] Intake/Output this shift: Total I/O In: -  Out: 280 [Urine:280]  Physical Exam: Work of breathing is normal.  JP is old bloody drainage and scant  Lab Results:  Results for orders placed during the hospital encounter of 06/06/12 (from the past 48 hour(s))  GLUCOSE, CAPILLARY     Status: Abnormal   Collection Time   06/19/12 11:58 AM      Component Value Range Comment   Glucose-Capillary 144 (*) 70 - 99 mg/dL   GLUCOSE, CAPILLARY     Status: Abnormal   Collection Time   06/19/12  4:57 PM      Component Value Range Comment   Glucose-Capillary 130 (*) 70 - 99 mg/dL   GLUCOSE, CAPILLARY     Status: Abnormal   Collection Time   06/19/12  8:04 PM      Component Value Range Comment   Glucose-Capillary 111 (*) 70 - 99 mg/dL    Comment 1 Notify RN     GLUCOSE, CAPILLARY     Status: Abnormal   Collection Time   06/19/12 11:30 PM      Component Value Range Comment   Glucose-Capillary 118 (*) 70 - 99 mg/dL    Comment 1 Notify RN     GLUCOSE, CAPILLARY     Status: Abnormal   Collection Time   06/20/12  4:02 AM      Component Value Range Comment   Glucose-Capillary 107 (*) 70 - 99 mg/dL   COMPREHENSIVE METABOLIC PANEL     Status: Abnormal   Collection Time   06/20/12  4:38 AM      Component Value Range Comment   Sodium 131 (*) 135 - 145 mEq/L    Potassium 3.7  3.5 -  5.1 mEq/L    Chloride 102  96 - 112 mEq/L    CO2 19  19 - 32 mEq/L    Glucose, Bld 120 (*) 70 - 99 mg/dL    BUN 14  6 - 23 mg/dL    Creatinine, Ser 7.82  0.50 - 1.35 mg/dL    Calcium 8.0 (*) 8.4 - 10.5 mg/dL    Total Protein 6.0  6.0 - 8.3 g/dL    Albumin 2.1 (*) 3.5 - 5.2 g/dL    AST 19  0 - 37 U/L    ALT 47  0 - 53 U/L    Alkaline Phosphatase 139 (*) 39 - 117 U/L    Total Bilirubin 0.8  0.3 - 1.2 mg/dL    GFR calc non Af Amer >90  >90 mL/min    GFR calc Af Amer >90  >90 mL/min   GLUCOSE, CAPILLARY     Status: Abnormal   Collection Time   06/20/12  7:15 AM      Component Value Range Comment   Glucose-Capillary 127 (*) 70 - 99 mg/dL  Comment 1 Notify RN      Comment 2 Documented in Chart     GLUCOSE, CAPILLARY     Status: Abnormal   Collection Time   06/20/12 12:02 PM      Component Value Range Comment   Glucose-Capillary 118 (*) 70 - 99 mg/dL    Comment 1 Notify RN      Comment 2 Documented in Chart     GLUCOSE, CAPILLARY     Status: Abnormal   Collection Time   06/20/12  4:29 PM      Component Value Range Comment   Glucose-Capillary 142 (*) 70 - 99 mg/dL   GLUCOSE, CAPILLARY     Status: Abnormal   Collection Time   06/20/12  8:01 PM      Component Value Range Comment   Glucose-Capillary 106 (*) 70 - 99 mg/dL   GLUCOSE, CAPILLARY     Status: Abnormal   Collection Time   06/21/12 12:13 AM      Component Value Range Comment   Glucose-Capillary 117 (*) 70 - 99 mg/dL   GLUCOSE, CAPILLARY     Status: Abnormal   Collection Time   06/21/12  3:57 AM      Component Value Range Comment   Glucose-Capillary 110 (*) 70 - 99 mg/dL   GLUCOSE, CAPILLARY     Status: Abnormal   Collection Time   06/21/12  8:14 AM      Component Value Range Comment   Glucose-Capillary 109 (*) 70 - 99 mg/dL     Radiology/Results: No results found.  Anti-infectives: Anti-infectives     Start     Dose/Rate Route Frequency Ordered Stop   06/16/12 0200   vancomycin (VANCOCIN) IVPB 1000 mg/200 mL  premix  Status:  Discontinued        1,000 mg 200 mL/hr over 60 Minutes Intravenous Every 8 hours 06/16/12 0137 06/17/12 0929   06/13/12 2359   vancomycin (VANCOCIN) 1,250 mg in sodium chloride 0.9 % 250 mL IVPB  Status:  Discontinued        1,250 mg 166.7 mL/hr over 90 Minutes Intravenous Every 12 hours 06/13/12 2335 06/16/12 0137   06/11/12 1000   vancomycin (VANCOCIN) IVPB 1000 mg/200 mL premix  Status:  Discontinued        1,000 mg 200 mL/hr over 60 Minutes Intravenous Every 12 hours 06/11/12 0924 06/13/12 2334   06/10/12 1000  piperacillin-tazobactam (ZOSYN) IVPB 3.375 g       3.375 g 12.5 mL/hr over 240 Minutes Intravenous Every 8 hours 06/10/12 0848     06/06/12 1800   ertapenem (INVANZ) 1 g in sodium chloride 0.9 % 50 mL IVPB        1 g 100 mL/hr over 30 Minutes Intravenous Every 24 hours 06/06/12 1740 06/06/12 1906   06/05/12 1433   ertapenem (INVANZ) 1 g in sodium chloride 0.9 % 50 mL IVPB        1 g 100 mL/hr over 30 Minutes Intravenous 60 min pre-op 06/05/12 1433 06/06/12 1030          Assessment/Plan: Problem List: Patient Active Problem List  Diagnosis  . Duodenal stricture  . Gastric outlet obstruction  . Biliary obstruction due to malignant neoplasm  . Altered mental state  . Peritoneal fluid collection  . Pneumonia    Slow progress.  Just ate omelet.  Improved.  15 Days Post-Op    LOS: 15 days   Matt B. Daphine Deutscher, MD, [facility] [phone] beeper [phone]  06/21/2012 9:44 AM

## 2012-06-21 NOTE — Progress Notes (Signed)
Subjective:  Patient denies any chest pain shortness of breath denies any abdominal pain states appetite is slowly improving  Objective:  Vital Signs in the last 24 hours: Temp:  [97.9 F (36.6 C)-98.5 F (36.9 C)] 97.9 F (36.6 C) (11/02 0605) Pulse Rate:  [67-70] 68  (11/02 0605) Resp:  [18-20] 20  (11/02 0605) BP: (148-156)/(68-76) 156/68 mmHg (11/02 0605) SpO2:  [95 %] 95 % (11/02 0605)  Intake/Output from previous day: 11/01 0701 - 11/02 0700 In: 1220 [P.O.:360; I.V.:800; IV Piggyback:60] Out: 2260 [Urine:2200; Drains:60] Intake/Output from this shift: Total I/O In: 2760 [I.V.:220; TPN:2540] Out: 290 [Urine:280; Drains:10]  Physical Exam: Neck: no adenopathy, no carotid bruit, no JVD and supple, symmetrical, trachea midline Lungs: Decreased breath sound at bases Heart: regular rate and rhythm, S1, S2 normal, no murmur, click, rub or gallop Abdomen: Soft bowel sounds present mildly distended surgical dressing and abdominal binders noted Jackson-Pratt catheter minimal drainage Extremities: extremities normal, atraumatic, no cyanosis or edema and Homans sign is negative, no sign of DVT  Lab Results:  [facility] 06/19/12 0420  WBC 7.3  HGB 10.0*  PLT 314    Basename 06/20/12 0438 06/19/12 0420  NA 131* 133*  K 3.7 3.4*  CL 102 102  CO2 19 20  GLUCOSE 120* 125*  BUN 14 12  CREATININE 0.76 0.81   No results found for this basename: TROPONINI:2,CK,MB:2 in the last 72 hours Hepatic Function Panel  Basename 06/20/12 0438  PROT 6.0  ALBUMIN 2.1*  AST 19  ALT 47  ALKPHOS 139*  BILITOT 0.8  BILIDIR --  IBILI --   No results found for this basename: CHOL in the last 72 hours No results found for this basename: PROTIME in the last 72 hours  Imaging: Imaging results have been reviewed and No results found.  Cardiac Studies:  Assessment/Plan:  Resolving right lower lobe pneumonia Status post Whipple procedure with peritoneal fluid collection status post JP  bulb drain History of biliary obstruction secondary to malignant neoplasm Gastric outlet obstruction History of duodenal stricture Status post altered mental status Plan Continue present management Increase by mouth intake as per surgery as tolerated  LOS: 15 days    Shermaine Brigham N 06/21/2012, 12:07 PM

## 2012-06-22 LAB — GLUCOSE, CAPILLARY
Glucose-Capillary: 121 mg/dL — ABNORMAL HIGH (ref 70–99)
Glucose-Capillary: 116 mg/dL — ABNORMAL HIGH (ref 70–99)

## 2012-06-22 MED ORDER — INSULIN ASPART 100 UNIT/ML ~~LOC~~ SOLN: 0.0000 [IU] | SUBCUTANEOUS | Status: DC

## 2012-06-22 NOTE — Progress Notes (Signed)
Calorie Count Note  Intervention: Encourage adequate oral intake of meals and supplement after discontinuation of TPN. Encourage family to bring foods from home to improve intake.   Diet: Fat modified Supplements: Ensure Complete BID TPN: Clinimix 5/20 @ 40 ml/hr with lipids at 5 ml/hr, multivitamins, and trace elements MWF due to national backorder. Provides 948 kcal, 48 g protein.   Per pharmacy, TPN to be D/C after current bag.  Patient with improved appetite, however, intake limited by cultural food preferences. Family encouraged to bring food from home.   Patient on Megace.   11/2  Breakfast: 280 kcal, 14 g protein Lunch: 100 kcal, 3 g protein Dinner: None reported Supplements: None reported  Total intake:  380 kcal (21% estimated needs) 17 g protein (18% estimated needs)  Nutrition Dx: Inadequate oral intake, ongoing  Goal:  1. TPN + lipids to meet 100% of estimated needs, not met 2. Patient to consume >50% of meals and supplements, not met  Monitor: Weights, labs, intake, TPN, appetite  Linnell Fulling, RD, LDN Pager #: [phone] After-Hours Pager #: [phone]

## 2012-06-22 NOTE — Progress Notes (Signed)
Brief Pharmacy note (TNA)  Spoke w/ RN this morning re: Pt enteral intake. Reports that pt is not eating much of in-house food d/t does not like offered options. She reports that family did bring in two meals yesterday (hard boiled egg with tortilla for breakfast and rice for dinner).   A/P: Feel enteral intake is being hindered d/t pt food preferences. Recommended that RN continue to encourage family to bring in meals and to document meals that are brought in and how much is eaten. Spoke with Dr Ezzard Standing who agreed that TNA can be d/c after current bag infused (~1800 tonight). No taper necessary as rate decreased by 50% yesterday evening. Would recommend enteral supplements if intake remains inadequate (TNA only indicated for non-fx GI). NG tube/enteral feeds are also option.   Gwen Her PharmD  [phone] 06/22/2012 10:38 AM

## 2012-06-22 NOTE — Progress Notes (Signed)
Subjective:  Patient denies any abdominal pain states had small BM earlier this morning. Denies any chest pain or shortness of breath  Objective:  Vital Signs in the last 24 hours: Temp:  [97.9 F (36.6 C)-98.5 F (36.9 C)] 98.5 F (36.9 C) (11/03 0500) Pulse Rate:  [73-89] 89  (11/03 0500) Resp:  [18] 18  (11/03 0500) BP: (128-139)/(69-84) 130/71 mmHg (11/03 0500) SpO2:  [96 %-97 %] 97 % (11/03 0500)  Intake/Output from previous day: 11/02 0701 - 11/03 0700 In: 5034 [P.O.:360; I.V.:1200; IV Piggyback:150; TPN:3324] Out: 1027 [Urine:1010; Drains:17] Intake/Output from this shift:    Physical Exam: Neck: no adenopathy, no carotid bruit, no JVD and supple, symmetrical, trachea midline Lungs: Decreased breath sound at bases Heart: regular rate and rhythm, S1, S2 normal, no murmur, click, rub or gallop Abdomen: Soft bowel sounds present mildly distended Jackson-Pratt bulb noted with minimal drainage Extremities: extremities normal, atraumatic, no cyanosis or edema  Lab Results: No results found for this basename: WBC:2,HGB:2,PLT:2 in the last 72 hours  Basename 06/20/12 0438  NA 131*  K 3.7  CL 102  CO2 19  GLUCOSE 120*  BUN 14  CREATININE 0.76   No results found for this basename: TROPONINI:2,CK,MB:2 in the last 72 hours Hepatic Function Panel  Basename 06/20/12 0438  PROT 6.0  ALBUMIN 2.1*  AST 19  ALT 47  ALKPHOS 139*  BILITOT 0.8  BILIDIR --  IBILI --   No results found for this basename: CHOL in the last 72 hours No results found for this basename: PROTIME in the last 72 hours  Imaging: Imaging results have been reviewed and No results found.  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

## 2012-06-22 NOTE — Progress Notes (Signed)
ANTIBIOTIC CONSULT NOTE - FOLLOW UP  Pharmacy Consult for Zosyn Indication: presumed HCAP, loculated anterior peritoneal fluid collection  No Known Allergies  Patient Measurements: Height: 5\' 6"  (167.6 cm) Weight: 147 lb 7.8 oz (66.9 kg) IBW/kg (Calculated) : 63.8   Vital Signs: Temp: 98.5 F (36.9 C) (11/03 0500) Temp src: Oral (11/03 0500) BP: 130/71 mmHg (11/03 0500) Pulse Rate: 89  (11/03 0500)  Labs:  Basename 06/20/12 0438  WBC --  HGB --  PLT --  LABCREA --  CREATININE 0.76   Estimated Creatinine Clearance: 83.1 ml/min (by C-G formula based on Cr of 0.76).  Dose changes/drug level info:  10/25 VT = 13.6 on 1g q12h - Inc to 1250mg  q12h 10/27 VT = 13.4 on 1250mg  q12h  Assessment: 66 yom with malignant stricture of the common bile duct s/p Whipple procedure and placement of pancreatic stent on 10/18.  10/22 C-xray with bibasilar peribronchial thickening and patchy opacities, L > R, cannot r/o pneumonia.   Day#13 Zosyn, Completed 7 days of vancomycin  Afebrile, WBC WNL  Renal function stable with CrCl 83 (N 92).  MRSA PCR negative on 10/15  Peritoneal fluid cx (10/28) = enterococcus sens to vanc and amp  Goal of Therapy:  Appropriate renal dosing of Zosyn  Plan:  1) Continue current Zosyn dosing 2) Spoke w/ Dr Ezzard Standing today who says plan to complete one more day of Zosyn for total of 14 days   Gwen Her PharmD  [phone] 06/22/2012 10:52 AM

## 2012-06-22 NOTE — Progress Notes (Signed)
Patient ID: Colin Castro, male   DOB: 1945-09-09, 66 y.o.   MRN: [id number] [facility] Surgery Progress Note:   16 Days Post-Op  Subjective: Mental status is clear.  Son not in room so we were communicating and he seemed to be aware and appropriate.   Objective: Vital signs in last 24 hours: Temp:  [97.9 F (36.6 C)-98.5 F (36.9 C)] 98.5 F (36.9 C) (11/03 0500) Pulse Rate:  [73-89] 89  (11/03 0500) Resp:  [18] 18  (11/03 0500) BP: (128-139)/(69-84) 130/71 mmHg (11/03 0500) SpO2:  [96 %-97 %] 97 % (11/03 0500)  Intake/Output from previous day: 11/02 0701 - 11/03 0700 In: 5034 [P.O.:360; I.V.:1200; IV Piggyback:150; TPN:3324] Out: 1027 [Urine:1010; Drains:17] Intake/Output this shift:    Physical Exam: Work of breathing is normal.  Abdominal binder in place.  Oral intake seems better.  Hopeful discharge soon per Dr. Donell Beers  Lab Results:  Results for orders placed during the hospital encounter of 06/06/12 (from the past 48 hour(s))  GLUCOSE, CAPILLARY     Status: Abnormal   Collection Time   06/20/12 12:02 PM      Component Value Range Comment   Glucose-Capillary 118 (*) 70 - 99 mg/dL    Comment 1 Notify RN      Comment 2 Documented in Chart     GLUCOSE, CAPILLARY     Status: Abnormal   Collection Time   06/20/12  4:29 PM      Component Value Range Comment   Glucose-Capillary 142 (*) 70 - 99 mg/dL   GLUCOSE, CAPILLARY     Status: Abnormal   Collection Time   06/20/12  8:01 PM      Component Value Range Comment   Glucose-Capillary 106 (*) 70 - 99 mg/dL   GLUCOSE, CAPILLARY     Status: Abnormal   Collection Time   06/21/12 12:13 AM      Component Value Range Comment   Glucose-Capillary 117 (*) 70 - 99 mg/dL   GLUCOSE, CAPILLARY     Status: Abnormal   Collection Time   06/21/12  3:57 AM      Component Value Range Comment   Glucose-Capillary 110 (*) 70 - 99 mg/dL   GLUCOSE, CAPILLARY     Status: Abnormal   Collection Time   06/21/12  8:14 AM      Component Value Range  Comment   Glucose-Capillary 109 (*) 70 - 99 mg/dL   GLUCOSE, CAPILLARY     Status: Abnormal   Collection Time   06/21/12 12:01 PM      Component Value Range Comment   Glucose-Capillary 109 (*) 70 - 99 mg/dL   GLUCOSE, CAPILLARY     Status: Abnormal   Collection Time   06/21/12  4:59 PM      Component Value Range Comment   Glucose-Capillary 116 (*) 70 - 99 mg/dL   GLUCOSE, CAPILLARY     Status: Abnormal   Collection Time   06/21/12  7:57 PM      Component Value Range Comment   Glucose-Capillary 116 (*) 70 - 99 mg/dL   GLUCOSE, CAPILLARY     Status: Abnormal   Collection Time   06/21/12 11:15 PM      Component Value Range Comment   Glucose-Capillary 135 (*) 70 - 99 mg/dL   GLUCOSE, CAPILLARY     Status: Abnormal   Collection Time   06/22/12  7:45 AM      Component Value Range Comment   Glucose-Capillary 121 (*)  70 - 99 mg/dL     Radiology/Results: No results found.  Anti-infectives: Anti-infectives     Start     Dose/Rate Route Frequency Ordered Stop   06/16/12 0200   vancomycin (VANCOCIN) IVPB 1000 mg/200 mL premix  Status:  Discontinued        1,000 mg 200 mL/hr over 60 Minutes Intravenous Every 8 hours 06/16/12 0137 06/17/12 0929   06/13/12 2359   vancomycin (VANCOCIN) 1,250 mg in sodium chloride 0.9 % 250 mL IVPB  Status:  Discontinued        1,250 mg 166.7 mL/hr over 90 Minutes Intravenous Every 12 hours 06/13/12 2335 06/16/12 0137   06/11/12 1000   vancomycin (VANCOCIN) IVPB 1000 mg/200 mL premix  Status:  Discontinued        1,000 mg 200 mL/hr over 60 Minutes Intravenous Every 12 hours 06/11/12 0924 06/13/12 2334   06/10/12 1000  piperacillin-tazobactam (ZOSYN) IVPB 3.375 g       3.375 g 12.5 mL/hr over 240 Minutes Intravenous Every 8 hours 06/10/12 0848     06/06/12 1800   ertapenem (INVANZ) 1 g in sodium chloride 0.9 % 50 mL IVPB        1 g 100 mL/hr over 30 Minutes Intravenous Every 24 hours 06/06/12 1740 06/06/12 1906   06/05/12 1433   ertapenem (INVANZ) 1 g  in sodium chloride 0.9 % 50 mL IVPB        1 g 100 mL/hr over 30 Minutes Intravenous 60 min pre-op 06/05/12 1433 06/06/12 1030          Assessment/Plan: Problem List: Patient Active Problem List  Diagnosis  . Duodenal stricture  . Gastric outlet obstruction  . Biliary obstruction due to malignant neoplasm  . Altered mental state  . Peritoneal fluid collection  . Pneumonia    Slow improvement.   16 Days Post-Op    LOS: 16 days   Matt B. Daphine Deutscher, MD, [facility] [phone] beeper [phone]  06/22/2012 10:39 AM

## 2012-06-22 NOTE — Progress Notes (Signed)
Attempted to contact family regarding if they could bring ethic food from home since patient does not have an appetite for our hospital food, attempts unsuccessful Means, [employer] N RN 06-22-2012

## 2012-06-23 LAB — GLUCOSE, CAPILLARY
Glucose-Capillary: 92 mg/dL (ref 70–99)
Glucose-Capillary: 88 mg/dL (ref 70–99)
Glucose-Capillary: 100 mg/dL — ABNORMAL HIGH (ref 70–99)
Glucose-Capillary: 95 mg/dL (ref 70–99)
Glucose-Capillary: 105 mg/dL — ABNORMAL HIGH (ref 70–99)

## 2012-06-23 MED ORDER — INSULIN ASPART 100 UNIT/ML ~~LOC~~ SOLN: 0.0000 [IU] | Freq: Three times a day (TID) | SUBCUTANEOUS | Status: DC

## 2012-06-23 MED ORDER — DEXTROSE-NACL 5-0.45 % IV SOLN
INTRAVENOUS | Status: DC
  Administered 2012-06-23: 19:00:00 via INTRAVENOUS

## 2012-06-23 MED ORDER — PANTOPRAZOLE SODIUM 40 MG PO TBEC
40.0000 mg | DELAYED_RELEASE_TABLET | Freq: Every morning | ORAL | Status: DC
  Administered 2012-06-23 – 2012-06-25 (×3): 40 mg via ORAL
  Filled 2012-06-23 (×3): qty 1

## 2012-06-23 MED ORDER — METOPROLOL TARTRATE 25 MG PO TABS
25.0000 mg | ORAL_TABLET | Freq: Two times a day (BID) | ORAL | Status: DC
  Administered 2012-06-23 – 2012-06-25 (×5): 25 mg via ORAL
  Filled 2012-06-23 (×6): qty 1

## 2012-06-23 NOTE — Progress Notes (Signed)
Subjective: Pt ok. Sitting up in chair. Not eating much Denies much pain  Objective: Physical Exam: BP 128/71  Pulse 86  Temp 97.9 F (36.6 C) (Oral)  Resp 18  Ht 5\' 6"  (1.676 m)  Wt 147 lb 7.8 oz (66.9 kg)  BMI 23.81 kg/m2  SpO2 96% Drain intact, site clean, NT Not much output, dark serous.   Studies/Results: No results found.  Assessment/Plan: s/p left ant abd abscess drainage 10/28; plans as per [facility].        LOS: 17 days    Brayton El PA-C 06/23/2012 9:20 AM

## 2012-06-23 NOTE — Progress Notes (Signed)
Subjective:  Feeling better. No chest pain. Appetite remains poor. T max 98.2 F  Objective:  Vital Signs in the last 24 hours: Temp:  [97.7 F (36.5 C)-98.2 F (36.8 C)] 97.7 F (36.5 C) (11/04 1400) Pulse Rate:  [80-87] 82  (11/04 1400) Cardiac Rhythm:  [-]  Resp:  [18-20] 18  (11/04 1400) BP: (114-147)/(61-77) 133/61 mmHg (11/04 1400) SpO2:  [93 %-98 %] 98 % (11/04 1400)  Physical Exam: BP Readings from Last 1 Encounters:  06/23/12 133/61    Wt Readings from Last 1 Encounters:  06/18/12 66.9 kg (147 lb 7.8 oz)    Weight change:   HEENT: [place]/AT, Eyes-Brown, PERL, EOMI, Conjunctiva-Pale pink, Sclera-Non-icteric Neck: No JVD, No bruit, Trachea midline. Lungs:  Clear, Bilateral. Cardiac:  Regular rhythm, normal S1 and S2, no S3.  Abdomen:  Soft, non-tender. Abdominal binder on. Extremities:  No edema present. No cyanosis. No clubbing. CNS: AxOx3, Cranial nerves grossly intact, moves all 4 extremities. Right handed. Skin: Warm and dry.   Intake/Output from previous day: 11/03 0701 - 11/04 0700 In: 2152.7 [P.O.:420; I.V.:1076.7; IV Piggyback:154; TPN:502] Out: 508 [Urine:500; Drains:8]    Lab Results: BMET    Component Value Date/Time   NA 131* 06/20/2012 0438   K 3.7 06/20/2012 0438   CL 102 06/20/2012 0438   CO2 19 06/20/2012 0438   GLUCOSE 120* 06/20/2012 0438   BUN 14 06/20/2012 0438   CREATININE 0.76 06/20/2012 0438   CALCIUM 8.0* 06/20/2012 0438   GFRNONAA >90 06/20/2012 0438   GFRAA >90 06/20/2012 0438   CBC    Component Value Date/Time   WBC 7.3 06/19/2012 0420   RBC 3.65* 06/19/2012 0420   HGB 10.0* 06/19/2012 0420   HCT 29.9* 06/19/2012 0420   PLT 314 06/19/2012 0420   MCV 81.9 06/19/2012 0420   MCH 27.4 06/19/2012 0420   MCHC 33.4 06/19/2012 0420   RDW 15.8* 06/19/2012 0420   LYMPHSABS 1.9 06/16/2012 0630   MONOABS 1.4* 06/16/2012 0630   EOSABS 0.7 06/16/2012 0630   BASOSABS 0.0 06/16/2012 0630   CARDIAC ENZYMES Lab Results  Component Value Date   TROPONINI <0.30 06/15/2012    Assessment/Plan:  Patient Active Hospital Problem List:  Altered mental state (06/15/2012) Assessment: Improved Peritoneal fluid collection (06/16/2012) S/P drain Pneumonia (06/16/2012) Stable  S/P Whipple's for T3N1 periampullary CA.  Diarrhea-improving  Hyponatremia Poor appetite   Change IV fluid to D51/2 NS. Home food + Ensure + Megace 400 mg. bid.   LOS: 17 days    Orpah Cobb  MD  06/23/2012, 6:44 PM

## 2012-06-23 NOTE — Progress Notes (Signed)
Patient ID: Colin Castro, male   DOB: August 16, 1946, 66 y.o.   MRN: [id number] 17 Days Post-Op  Subjective: Still with poor po intake.  Some of this is cultural, but didn't eat much of food from home either.    Objective: Vital signs in last 24 hours: Temp:  [97.9 F (36.6 C)-98.2 F (36.8 C)] 97.9 F (36.6 C) (11/04 0400) Pulse Rate:  [80-99] 86  (11/04 0400) Resp:  [18-20] 18  (11/04 0400) BP: (114-152)/(68-81) 128/71 mmHg (11/04 0500) SpO2:  [93 %-97 %] 96 % (11/04 0400) Last BM Date: 06/22/12  Intake/Output from previous day: 11/03 0701 - 11/04 0700 In: 2152.7 [P.O.:420; I.V.:1076.7; IV Piggyback:154; TPN:502] Out: 508 [Urine:500; Drains:8] Intake/Output this shift:   General:  Alert and oriented.   GI: soft, minimal tenderness. good bs and flatus. incision looks good Drain  serosang.    Lab Results:  No results found for this basename: WBC:2,HGB:2,HCT:2,PLT:2 in the last 72 hours BMET No results found for this basename: NA:2,K:2,CL:2,CO2:2,GLUCOSE:2,BUN:2,CREATININE:2,CALCIUM:2 in the last 72 hours PT/INR No results found for this basename: LABPROT:2,INR:2 in the last 72 hours ABG No results found for this basename: PHART:2,PCO2:2,PO2:2,HCO3:2 in the last 72 hours  Studies/Results: No results found.  Anti-infectives: Anti-infectives     Start     Dose/Rate Route Frequency Ordered Stop   06/16/12 0200   vancomycin (VANCOCIN) IVPB 1000 mg/200 mL premix  Status:  Discontinued        1,000 mg 200 mL/hr over 60 Minutes Intravenous Every 8 hours 06/16/12 0137 06/17/12 0929   06/13/12 2359   vancomycin (VANCOCIN) 1,250 mg in sodium chloride 0.9 % 250 mL IVPB  Status:  Discontinued        1,250 mg 166.7 mL/hr over 90 Minutes Intravenous Every 12 hours 06/13/12 2335 06/16/12 0137   06/11/12 1000   vancomycin (VANCOCIN) IVPB 1000 mg/200 mL premix  Status:  Discontinued        1,000 mg 200 mL/hr over 60 Minutes Intravenous Every 12 hours 06/11/12 0924 06/13/12 2334   06/10/12 1000   piperacillin-tazobactam (ZOSYN) IVPB 3.375 g        3.375 g 12.5 mL/hr over 240 Minutes Intravenous Every 8 hours 06/10/12 0848     06/06/12 1800   ertapenem (INVANZ) 1 g in sodium chloride 0.9 % 50 mL IVPB        1 g 100 mL/hr over 30 Minutes Intravenous Every 24 hours 06/06/12 1740 06/06/12 1906   06/05/12 1433   ertapenem (INVANZ) 1 g in sodium chloride 0.9 % 50 mL IVPB        1 g 100 mL/hr over 30 Minutes Intravenous 60 min pre-op 06/05/12 1433 06/06/12 1030          Assessment/Plan: s/p Procedure(s) (LRB) with comments: WHIPPLE PROCEDURE (N/A) - Pancreatico duodenectomy LYSIS OF ADHESION (N/A) CHOLECYSTECTOMY (N/A) PANCREATIC STENT PLACEMENT (N/A) - Removal of biliary stent; Placement of pacreatic stent LYMPH NODE DISSECTION (N/A) - Portal lymph node dissection  Creon for panc insufficiency Metoprolol for HTN.  Change to PO. Calorie counts. Megace for appetite stimulation.   D/c zosyn today for 14 day course for PNA and enterococcus abscess.    LOS: 17 days    Zahra Peffley 06/23/2012

## 2012-06-23 NOTE — Progress Notes (Signed)
Physical Therapy Treatment Patient Details Name: Colin Castro MRN: [id number] DOB: Mar 06, 1946 Today's Date: 06/23/2012 Time: 0998-3382 PT Time Calculation (min): 13 min  PT Assessment / Plan / Recommendation Comments on Treatment Session  Pt doing well ambulating with RW.  May attempt to use cane or no AD next visit.    Follow Up Recommendations  Home health PT;Supervision - Intermittent     Does the patient have the potential to tolerate intense rehabilitation     Barriers to Discharge        Equipment Recommendations  Rolling walker with 5" wheels    Recommendations for Other Services    Frequency     Plan Discharge plan remains appropriate;Frequency remains appropriate    Precautions / Restrictions Precautions Precautions: Fall Precaution Comments: abdominal binder   Pertinent Vitals/Pain No pain at rest, denies pain ambulating.    Mobility  Bed Mobility Bed Mobility: Supine to Sit;Sit to Supine Rolling Right: 6: Modified independent (Device/Increase time) Right Sidelying to Sit: 6: Modified independent (Device/Increase time) Supine to Sit: 6: Modified independent (Device/Increase time) Sit to Supine: 6: Modified independent (Device/Increase time) Transfers Transfers: Sit to Stand;Stand to Sit Sit to Stand: 4: Min assist;From bed Stand to Sit: To bed;4: Min guard Details for Transfer Assistance: assist for steadying upon rise due to posterior lean on bed Ambulation/Gait Ambulation/Gait Assistance: 4: Min guard Ambulation Distance (Feet): 400 Feet Assistive device: Rolling walker Ambulation/Gait Assistance Details: ambulated in hallway with RW, then assisted pt to bathroom without AD, may be ready for no AD next visit. Gait Pattern: Step-through pattern General Gait Details: fatigued and increased RR upon returning to bed however denies feeling bad, no SOB    Exercises     PT Diagnosis:    PT Problem List:   PT Treatment Interventions:     PT Goals Acute  Rehab PT Goals PT Goal: Supine/Side to Sit - Progress: Met PT Goal: Sit to Supine/Side - Progress: Met PT Goal: Sit to Stand - Progress: Progressing toward goal PT Goal: Ambulate - Progress: Progressing toward goal  Visit Information  Last PT Received On: 06/23/12 Assistance Needed: +1    Subjective Data  Subjective: I need to use bathroom   Cognition  Overall Cognitive Status: Appears within functional limits for tasks assessed/performed    Balance     End of Session PT - End of Session Activity Tolerance: Patient limited by fatigue Patient left: in bed;with call bell/phone within reach   GP     [facility] E 06/23/2012, 12:17 PM Pager: [phone]

## 2012-06-23 NOTE — Progress Notes (Signed)
Calorie Count Note  Intervention: Now that pt no longer on TPN and still consuming minimal PO intake (no more than 25% meal intake since admission), despite encouragement from nursing, RD, MD, and family, with food being brought in from home, recommend MD consider enteral nutrition. Perhaps pt's refusal to eat is r/t pt disease progression, recommend MD discuss with pt and family, perhaps oncology consult would be beneficial. Recommend MD consider palliative care consult to see if pt does desire further artifical nutrition versus comfort feeds. Will continue to monitor and analyze calorie counts, however do not expect dramatic increase in intake soon.   Diet: Fat modified Supplements: Ensure Complete BID - Pt no longer on TPN. Pt currently asleep. RN has been calling pt's daughter and encouraging her to bring in foods that pt likes. Pt continues to be on Megace for appetite stimulation.    11/3 Breakfast: 0% meal intake Lunch: 65 calories, 2g protein Dinner: 100 calories, 4g protein  11/4 Breakfast: 10 calories, 1g protein  Total daily intake for 11/3: 165 kcal (9% of minimum estimated needs)  6g protein (6% of minimum estimated needs)  Nutrition Dx: Inadequate oral intake - ongoing  Goal:  1. TPN + lipids to meet 100% of estimated needs - no longer appropriate, pt not on TPN. 2. Pt to consume >50% of meals/supplements - not met, pt averaging 0-10% meal intake, no supplement intake recorded.    Levon Hedger MS, RD, LDN [phone] Pager [phone] After Hours Pager

## 2012-06-24 LAB — COMPREHENSIVE METABOLIC PANEL
Albumin: 2.3 g/dL — ABNORMAL LOW (ref 3.5–5.2)
Alkaline Phosphatase: 123 U/L — ABNORMAL HIGH (ref 39–117)
BUN: 16 mg/dL (ref 6–23)
Potassium: 4.7 mEq/L (ref 3.5–5.1)
Sodium: 130 mEq/L — ABNORMAL LOW (ref 135–145)
Total Protein: 6.5 g/dL (ref 6.0–8.3)

## 2012-06-24 LAB — CBC
HCT: 36 % — ABNORMAL LOW (ref 39.0–52.0)
MCHC: 32.2 g/dL (ref 30.0–36.0)
RDW: 16.2 % — ABNORMAL HIGH (ref 11.5–15.5)

## 2012-06-24 LAB — GLUCOSE, CAPILLARY
Glucose-Capillary: 99 mg/dL (ref 70–99)
Glucose-Capillary: 93 mg/dL (ref 70–99)
Glucose-Capillary: 104 mg/dL — ABNORMAL HIGH (ref 70–99)

## 2012-06-24 MED ORDER — DEXTROSE-NACL 5-0.45 % IV SOLN
INTRAVENOUS | Status: DC
  Administered 2012-06-24: 11:00:00 via INTRAVENOUS

## 2012-06-24 NOTE — Progress Notes (Signed)
Subjective:  No chjest pain. Some abdominal discomfort post meal. Afebrile  Objective:  Vital Signs in the last 24 hours: Temp:  [97.9 F (36.6 C)-98.8 F (37.1 C)] 97.9 F (36.6 C) (11/05 1400) Pulse Rate:  [72-78] 72  (11/05 1400) Cardiac Rhythm:  [-]  Resp:  [18] 18  (11/05 1400) BP: (115-139)/(61-71) 115/67 mmHg (11/05 1400) SpO2:  [93 %-100 %] 100 % (11/05 1400)  Physical Exam: BP Readings from Last 1 Encounters:  06/24/12 115/67    Wt Readings from Last 1 Encounters:  06/18/12 66.9 kg (147 lb 7.8 oz)    Weight change:   HEENT: [place]/AT, Eyes-Brown, PERL, EOMI, Conjunctiva-Pale pink, Sclera-Non-icteric Neck: No JVD, No bruit, Trachea midline. Lungs:  Clear, Bilateral. Cardiac:  Regular rhythm, normal S1 and S2, no S3.  Abdomen:  Soft, non-tender. Binder on. Extremities:  No edema present. No cyanosis. No clubbing. CNS: AxOx3, Cranial nerves grossly intact, moves all 4 extremities. Right handed. Skin: Warm and dry.   Intake/Output from previous day: 11/04 0701 - 11/05 0700 In: 1780 [P.O.:540; I.V.:1240] Out: 1236 [Urine:1225; Drains:11]    Lab Results: BMET    Component Value Date/Time   NA 130* 06/24/2012 0437   K 4.7 06/24/2012 0437   CL 102 06/24/2012 0437   CO2 15* 06/24/2012 0437   GLUCOSE 98 06/24/2012 0437   BUN 16 06/24/2012 0437   CREATININE 0.70 06/24/2012 0437   CALCIUM 8.2* 06/24/2012 0437   GFRNONAA >90 06/24/2012 0437   GFRAA >90 06/24/2012 0437   CBC    Component Value Date/Time   WBC 7.3 06/24/2012 0437   RBC 4.35 06/24/2012 0437   HGB 11.6* 06/24/2012 0437   HCT 36.0* 06/24/2012 0437   PLT 285 06/24/2012 0437   MCV 82.8 06/24/2012 0437   MCH 26.7 06/24/2012 0437   MCHC 32.2 06/24/2012 0437   RDW 16.2* 06/24/2012 0437   LYMPHSABS 1.9 06/16/2012 0630   MONOABS 1.4* 06/16/2012 0630   EOSABS 0.7 06/16/2012 0630   BASOSABS 0.0 06/16/2012 0630   CARDIAC ENZYMES Lab Results  Component Value Date   TROPONINI <0.30 06/15/2012     Assessment/Plan:  Patient Active Hospital Problem List: Altered mental state (06/15/2012) Assessment: Improved Peritoneal fluid collection (06/16/2012) S/P drain Pneumonia (06/16/2012) Stable  S/P Whipple's for T3N1 periampullary CA.   Hyponatremia  Poor appetite -mild improvement  Continue medications.   LOS: 18 days    Orpah Cobb  MD  06/24/2012, 7:03 PM

## 2012-06-24 NOTE — Progress Notes (Signed)
Calorie Count Note  Intervention: Continued to encourage increased meal/supplement intake. Assisted pt with ordering dinner. Nursing has been encouraging pt to eat as well. Will monitor and analyze calorie count.   48 hour calorie count ordered.  Diet: Fat modified  Supplements: Ensure Complete BID  11/5 Breakfast: Did not order, wife reports pt does not ever eat breakfast, even at home.  Lunch: 100% of meal tray - 350 calories, 30g protein Dinner: Has not come up to floor yet Supplements: Pt not consuming  Total intake: 350 kcal (19% of minimum estimated needs)  30g protein (31% of minimum estimated needs)  Nutrition Dx: Inadequate oral intake - ongoing  Goal: Pt to consume >50% of meals/supplements - met only with 1 meal with 100% intake.    Levon Hedger MS, RD, LDN [phone] Pager [phone] After Hours Pager

## 2012-06-24 NOTE — Progress Notes (Signed)
Patient ID: Colin Castro, male   DOB: [birth year], 66 y.o.   MRN: [id number] 18 Days Post-Op   Subjective: Some improvement in diet.  Ate some rice and curry soup.  Feels full easily.    Objective: Vital signs in last 24 hours: Temp:  [97.7 F (36.5 C)-98.8 F (37.1 C)] 98.2 F (36.8 C) (11/05 0555) Pulse Rate:  [73-82] 78  (11/05 0555) Resp:  [18] 18  (11/05 0555) BP: (129-135)/(61-71) 129/71 mmHg (11/05 0555) SpO2:  [93 %-98 %] 93 % (11/05 0555) Last BM Date: 06/22/12  Intake/Output from previous day: 11/04 0701 - 11/05 0700 In: 1780 [P.O.:540; I.V.:1240] Out: 1236 [Urine:1225; Drains:11] Intake/Output this shift:   General:  Alert and oriented.   GI: soft, minimal tenderness. good bs and flatus. incision looks good [employer] Results:   Basename 06/24/12 0437  WBC 7.3  HGB 11.6*  HCT 36.0*  PLT 285   BMET  Basename 06/24/12 0437  NA 130*  K 4.7  CL 102  CO2 15*  GLUCOSE 98  BUN 16  CREATININE 0.70  CALCIUM 8.2*   PT/INR No results found for this basename: LABPROT:2,INR:2 in the last 72 hours ABG No results found for this basename: PHART:2,PCO2:2,PO2:2,HCO3:2 in the last 72 hours  Studies/Results: No results found.  Anti-infectives: Anti-infectives     Start     Dose/Rate Route Frequency Ordered Stop   06/16/12 0200   vancomycin (VANCOCIN) IVPB 1000 mg/200 mL premix  Status:  Discontinued        1,000 mg 200 mL/hr over 60 Minutes Intravenous Every 8 hours 06/16/12 0137 06/17/12 0929   06/13/12 2359   vancomycin (VANCOCIN) 1,250 mg in sodium chloride 0.9 % 250 mL IVPB  Status:  Discontinued        1,250 mg 166.7 mL/hr over 90 Minutes Intravenous Every 12 hours 06/13/12 2335 06/16/12 0137   06/11/12 1000   vancomycin (VANCOCIN) IVPB 1000 mg/200 mL premix  Status:  Discontinued        1,000 mg 200 mL/hr over 60 Minutes Intravenous Every 12 hours 06/11/12 0924 06/13/12 2334   06/10/12 1000   piperacillin-tazobactam (ZOSYN) IVPB 3.375 g  Status:   Discontinued        3.375 g 12.5 mL/hr over 240 Minutes Intravenous Every 8 hours 06/10/12 0848 06/23/12 0857   06/06/12 1800   ertapenem (INVANZ) 1 g in sodium chloride 0.9 % 50 mL IVPB        1 g 100 mL/hr over 30 Minutes Intravenous Every 24 hours 06/06/12 1740 06/06/12 1906   06/05/12 1433   ertapenem (INVANZ) 1 g in sodium chloride 0.9 % 50 mL IVPB        1 g 100 mL/hr over 30 Minutes Intravenous 60 min pre-op 06/05/12 1433 06/06/12 1030          Assessment/Plan: s/p Procedure(s) (LRB) with comments: WHIPPLE PROCEDURE (N/A) - Pancreatico duodenectomy LYSIS OF ADHESION (N/A) CHOLECYSTECTOMY (N/A) PANCREATIC STENT PLACEMENT (N/A) - Removal of biliary stent; Placement of pacreatic stent LYMPH NODE DISSECTION (N/A) - Portal lymph node dissection  Creon for panc insufficiency Metoprolol for HTN.    Megace for appetite stimulation.   Off antibiotics.   Leave drain for enterococcus abscess.  Hope for home Thursday.    LOS: 18 days    Lou Loewe 06/24/2012

## 2012-06-25 LAB — GLUCOSE, CAPILLARY
Glucose-Capillary: 97 mg/dL (ref 70–99)
Glucose-Capillary: 99 mg/dL (ref 70–99)

## 2012-06-25 LAB — COMPREHENSIVE METABOLIC PANEL
Albumin: 2.6 g/dL — ABNORMAL LOW (ref 3.5–5.2)
Alkaline Phosphatase: 124 U/L — ABNORMAL HIGH (ref 39–117)
BUN: 13 mg/dL (ref 6–23)
Calcium: 8.7 mg/dL (ref 8.4–10.5)
Creatinine, Ser: 0.74 mg/dL (ref 0.50–1.35)
Potassium: 4.1 mEq/L (ref 3.5–5.1)
Total Protein: 6.3 g/dL (ref 6.0–8.3)

## 2012-06-25 MED ORDER — PANCRELIPASE (LIP-PROT-AMYL) 12000-38000 UNITS PO CPEP: 2.0000 | ORAL_CAPSULE | Freq: Three times a day (TID) | ORAL | Status: DC

## 2012-06-25 MED ORDER — QUETIAPINE FUMARATE 50 MG PO TABS: 50.0000 mg | ORAL_TABLET | Freq: Every evening | ORAL | Status: DC | PRN

## 2012-06-25 MED ORDER — OXYCODONE-ACETAMINOPHEN 5-325 MG PO TABS: 1.0000 | ORAL_TABLET | ORAL | Status: DC | PRN

## 2012-06-25 MED ORDER — PANTOPRAZOLE SODIUM 40 MG PO TBEC: 40.0000 mg | DELAYED_RELEASE_TABLET | Freq: Every morning | ORAL | Status: DC

## 2012-06-25 MED ORDER — ONDANSETRON HCL 4 MG PO TABS: 4.0000 mg | ORAL_TABLET | Freq: Four times a day (QID) | ORAL | Status: DC | PRN

## 2012-06-25 MED ORDER — MEGESTROL ACETATE 400 MG/10ML PO SUSP: 400.0000 mg | Freq: Two times a day (BID) | ORAL | Status: DC

## 2012-06-25 NOTE — Progress Notes (Signed)
Patient ID: Colin Castro, male   DOB: Oct 31, 1945, 66 y.o.   MRN: [id number] 19 Days Post-Op   Subjective: Some improvement in diet, asking if he can go home later today.    Objective: Vital signs in last 24 hours: Temp:  [97.7 F (36.5 C)-98 F (36.7 C)] 98 F (36.7 C) (11/06 1357) Pulse Rate:  [67-72] 71  (11/06 1357) Resp:  [18] 18  (11/06 1357) BP: (117-135)/(65-75) 122/71 mmHg (11/06 1357) SpO2:  [96 %-97 %] 97 % (11/06 1357) Last BM Date: 06/25/12  Intake/Output from previous day: 11/05 0701 - 11/06 0700 In: 614 [P.O.:240; I.V.:374] Out: 1350 [Urine:1325; Drains:25] Intake/Output this shift: Total I/O In: 78 [I.V.:77; Other:1] Out: 10 [Drains:10]  General:  Alert and oriented.   GI: soft, minimal tenderness. good bs and flatus. incision looks good [employer] Results:   Basename 06/24/12 0437  WBC 7.3  HGB 11.6*  HCT 36.0*  PLT 285   BMET  Basename 06/25/12 0555 06/24/12 0437  NA 132* 130*  K 4.1 4.7  CL 101 102  CO2 20 15*  GLUCOSE 94 98  BUN 13 16  CREATININE 0.74 0.70  CALCIUM 8.7 8.2*   PT/INR No results found for this basename: LABPROT:2,INR:2 in the last 72 hours ABG No results found for this basename: PHART:2,PCO2:2,PO2:2,HCO3:2 in the last 72 hours  Studies/Results: No results found.  Anti-infectives: Anti-infectives     Start     Dose/Rate Route Frequency Ordered Stop   06/16/12 0200   vancomycin (VANCOCIN) IVPB 1000 mg/200 mL premix  Status:  Discontinued        1,000 mg 200 mL/hr over 60 Minutes Intravenous Every 8 hours 06/16/12 0137 06/17/12 0929   06/13/12 2359   vancomycin (VANCOCIN) 1,250 mg in sodium chloride 0.9 % 250 mL IVPB  Status:  Discontinued        1,250 mg 166.7 mL/hr over 90 Minutes Intravenous Every 12 hours 06/13/12 2335 06/16/12 0137   06/11/12 1000   vancomycin (VANCOCIN) IVPB 1000 mg/200 mL premix  Status:  Discontinued        1,000 mg 200 mL/hr over 60 Minutes Intravenous Every 12 hours 06/11/12 0924  06/13/12 2334   06/10/12 1000   piperacillin-tazobactam (ZOSYN) IVPB 3.375 g  Status:  Discontinued        3.375 g 12.5 mL/hr over 240 Minutes Intravenous Every 8 hours 06/10/12 0848 06/23/12 0857   06/06/12 1800   ertapenem (INVANZ) 1 g in sodium chloride 0.9 % 50 mL IVPB        1 g 100 mL/hr over 30 Minutes Intravenous Every 24 hours 06/06/12 1740 06/06/12 1906   06/05/12 1433   ertapenem (INVANZ) 1 g in sodium chloride 0.9 % 50 mL IVPB        1 g 100 mL/hr over 30 Minutes Intravenous 60 min pre-op 06/05/12 1433 06/06/12 1030          Assessment/Plan: s/p Procedure(s) (LRB) with comments: WHIPPLE PROCEDURE (N/A) - Pancreatico duodenectomy LYSIS OF ADHESION (N/A) CHOLECYSTECTOMY (N/A) PANCREATIC STENT PLACEMENT (N/A) - Removal of biliary stent; Placement of pacreatic stent LYMPH NODE DISSECTION (N/A) - Portal lymph node dissection  Creon for panc insufficiency Metoprolol for HTN.    Megace for appetite stimulation.   Off antibiotics.   Leave drain for enterococcus abscess.  Hope for home later today or Thursday.    LOS: 19 days    Rion Catala 06/25/2012

## 2012-06-25 NOTE — Progress Notes (Signed)
Physical Therapy Treatment Patient Details Name: Colin Castro MRN: [id number] DOB: 27-Mar-1946 Today's Date: 06/25/2012 Time: 3086-5784 PT Time Calculation (min): 10 min  PT Assessment / Plan / Recommendation Comments on Treatment Session  Mobilizing well. Still slightly impulsive. Pt states he plans to d/c home today.     Follow Up Recommendations  Home health PT;Supervision - Intermittent     Does the patient have the potential to tolerate intense rehabilitation     Barriers to Discharge        Equipment Recommendations  Rolling walker with 5" wheels    Recommendations for Other Services OT consult  Frequency Min 3X/week   Plan Discharge plan remains appropriate    Precautions / Restrictions Precautions Precautions: Fall Restrictions Weight Bearing Restrictions: No   Pertinent Vitals/Pain Pt denies    Mobility  Bed Mobility Bed Mobility: Supine to Sit Supine to Sit: 6: Modified independent (Device/Increase time) Transfers Transfers: Sit to Stand;Stand to Sit Sit to Stand: 6: Modified independent (Device/Increase time);From bed Stand to Sit: 6: Modified independent (Device/Increase time);To bed Ambulation/Gait Ambulation/Gait Assistance: 4: Min guard Ambulation Distance (Feet): 450 Feet Ambulation/Gait Assistance Details: Pt pushed IV pole. No LOB. VCs safety, pacing.  Gait Pattern: Within Functional Limits Stairs: Yes Stairs Assistance: 5: Supervision Stair Management Technique: One rail Left Number of Stairs: 4     Exercises     PT Diagnosis:    PT Problem List:   PT Treatment Interventions:     PT Goals Acute Rehab PT Goals Pt will go Supine/Side to Sit: with modified independence PT Goal: Supine/Side to Sit - Progress: Met Pt will go Sit to Supine/Side: with modified independence PT Goal: Sit to Supine/Side - Progress: Met Pt will go Sit to Stand: with modified independence PT Goal: Sit to Stand - Progress: Met Pt will Ambulate: >150 feet;with  supervision;with least restrictive assistive device PT Goal: Ambulate - Progress: Progressing toward goal  Visit Information  Last PT Received On: 06/25/12 Assistance Needed: +1    Subjective Data  Subjective: "I'm going home today." Patient Stated Goal: Home   Cognition  Overall Cognitive Status: Appears within functional limits for tasks assessed/performed Arousal/Alertness: Awake/Castro Behavior During Session: [facility] for tasks performed    Balance     End of Session PT - End of Session Activity Tolerance: Patient tolerated treatment well Patient left: with call bell/phone within reach;in bed   GP     Colin Castro [facility] 06/25/2012, 10:17 AM [phone]

## 2012-06-25 NOTE — Progress Notes (Signed)
Discharge instructions reviewed with family.  Pt called his daughter and asked nurse to go over discharge instructions with her so she could make his follow up appointments.  No complaints or concerns at this time.  Pt to be discharged how with his wife.

## 2012-06-25 NOTE — Progress Notes (Signed)
Subjective:  Feeling better. Wishing to go home. Afebrile.  Objective:  Vital Signs in the last 24 hours: Temp:  [97.7 F (36.5 C)-97.9 F (36.6 C)] 97.7 F (36.5 C) (11/06 0612) Pulse Rate:  [71-72] 72  (11/06 0612) Cardiac Rhythm:  [-]  Resp:  [18] 18  (11/06 0612) BP: (115-139)/(61-75) 117/65 mmHg (11/06 0612) SpO2:  [96 %-100 %] 97 % (11/06 0612)  Physical Exam: BP Readings from Last 1 Encounters:  06/25/12 117/65    Wt Readings from Last 1 Encounters:  06/18/12 66.9 kg (147 lb 7.8 oz)    Weight change:   HEENT: [place]/AT, Eyes-Brown, PERL, EOMI, Conjunctiva-Pale pink, Sclera-Non-icteric Neck: No JVD, No bruit, Trachea midline. Lungs:  Clear, Bilateral. Cardiac:  Regular rhythm, normal S1 and S2, no S3.  Abdomen:  Soft, non-tender. Off binder. Drain with scanty brownish liquid in bulb. Extremities:  No edema present. No cyanosis. No clubbing. CNS: AxOx3, Cranial nerves grossly intact, moves all 4 extremities. Right handed. Skin: Warm and dry.   Intake/Output from previous day: 11/05 0701 - 11/06 0700 In: 614 [P.O.:240; I.V.:374] Out: 1350 [Urine:1325; Drains:25]    Lab Results: BMET    Component Value Date/Time   NA 132* 06/25/2012 0555   K 4.1 06/25/2012 0555   CL 101 06/25/2012 0555   CO2 20 06/25/2012 0555   GLUCOSE 94 06/25/2012 0555   BUN 13 06/25/2012 0555   CREATININE 0.74 06/25/2012 0555   CALCIUM 8.7 06/25/2012 0555   GFRNONAA >90 06/25/2012 0555   GFRAA >90 06/25/2012 0555   CBC    Component Value Date/Time   WBC 7.3 06/24/2012 0437   RBC 4.35 06/24/2012 0437   HGB 11.6* 06/24/2012 0437   HCT 36.0* 06/24/2012 0437   PLT 285 06/24/2012 0437   MCV 82.8 06/24/2012 0437   MCH 26.7 06/24/2012 0437   MCHC 32.2 06/24/2012 0437   RDW 16.2* 06/24/2012 0437   LYMPHSABS 1.9 06/16/2012 0630   MONOABS 1.4* 06/16/2012 0630   EOSABS 0.7 06/16/2012 0630   BASOSABS 0.0 06/16/2012 0630   CARDIAC ENZYMES Lab Results  Component Value Date   TROPONINI <0.30 06/15/2012     Assessment/Plan:  Patient Active Hospital Problem List: Altered mental state (06/15/2012) Assessment: Improved Peritoneal fluid collection (06/16/2012) S/P drain- decreasing fluid in bulb Pneumonia (06/16/2012) Stable  S/P Whipple's for T3N1 periampullary CA.  Hyponatremia   Improving Poor appetite -Further mild improvement  May discharge home if ok with surgery. Follow up in 1 week in office.   LOS: 19 days    Orpah Cobb  MD  06/25/2012, 9:09 AM

## 2012-06-25 NOTE — Discharge Summary (Signed)
Physician Discharge Summary  Patient ID: Colin Castro MRN: [id number] DOB/AGE: 1946-03-15 66 y.o.  Admit date: 06/06/2012 Discharge date: 06/25/2012  Admission Diagnoses:  Discharge Diagnoses:  Active Problems:  Biliary obstruction due to malignant neoplasm  Altered mental state  Peritoneal fluid collection  Pneumonia   Discharged Condition: stable  Hospital Course:  Patient was admitted to the ICU following his pancreaticoduodenectomy.  He originally was a little hypertensive. He required antihypertensives. His NG tube was kept in for 5 days. He eventually developed some bowel function and his NG tube was removed. His pain was controlled initially with IV Tylenol as well as an On-Q pain pump and a PCA. He was transferred to the floor.  He complained of some abdominal pain and was a little bit distended. Once he had a bowel movement this appeared to resolve a bit.  He started having low-grade temperatures and a chest x-ray demonstrated a pneumonia.  He also had significant hyponatremia and hypochloremia that resolved toward the end of his hospitalization. He required normal saline and salt tablets.  However, he became febrile and delirious. He was transferred back to the ICU.  He received a CT scan confirming the pneumonia as well as an abdominal fluid collection.  The abdominal fluid collection was tapped in interventional radiology. It was nonbilious and he had no evidence of a pancreatic leak based on amylase testing of all of his drains. It originally had no bacteria and no PMNs on Gram stain, but did grow out enterococcus. He was on Zosyn for a total of 10 days. He has was on vancomycin for a total of 5-7 days.  His mental status improved, and he was able to be transferred back to the floor. Throughout this time his family was readily available for translation.    He was on TNA through the first 2 weeks of his hospitalization. This was eventually weaned to off in order to try to help him  be hungry. He was able to tolerate a regular diet and liquids. He was able to transition to oral pain medication. He is ambulating spontaneously. He was discharged to home with the interventional radiology drain. The surgical drains as well as the staples were removed. He is discharged in stable condition. I discussed this with his daughter. He will followup with me in one to 2 weeks.  Consults: pulmonary/intensive care  Significant Diagnostic Studies: labs: Na 132 prior to d/c.  HCT 36.  WBCs 7.3k  Treatments: IV hydration, antibiotics: vancomycin and Zosyn, TPN and surgery: whipple  Discharge Exam: Blood pressure 122/71, pulse 71, temperature 98 F (36.7 C), temperature source Oral, resp. rate 18, height 5\' 6"  (1.676 m), weight 147 lb 7.8 oz (66.9 kg), SpO2 97.00%. General appearance: alert, cooperative and no distress Chest wall: no tenderness GI: soft, non-tender; bowel sounds normal; no masses,  no organomegaly Extremities: extremities normal, atraumatic, no cyanosis or edema Neurologic: Alert and oriented X 3, normal strength and tone. Normal symmetric reflexes. Normal coordination and gait Drain murky and serosang  Disposition: 01-Home or Self Care  Discharge Orders    Future Orders Please Complete By Expires   Diet - low sodium heart healthy      Increase activity slowly      Change dressing (specify)      Comments:   Measure and record drain output twice daily.       Medication List     As of 06/25/2012  4:01 PM    STOP taking these medications  TPN ADULT      TAKE these medications         acetaminophen 500 MG tablet   Commonly known as: TYLENOL   Take 500 mg by mouth every 6 (six) hours as needed. pain      ALPRAZolam 0.25 MG tablet   Commonly known as: XANAX   Take 1 tablet (0.25 mg total) by mouth 3 (three) times daily.      CERTA-VITE PO   Take 1 tablet by mouth daily.      lipase/protease/amylase 16109 UNITS Cpep   Commonly known as:  CREON-10/PANCREASE   Take 2 capsules by mouth 3 (three) times daily before meals.      lisinopril 20 MG tablet   Commonly known as: PRINIVIL,ZESTRIL   Take 20 mg by mouth daily.      megestrol 400 MG/10ML suspension   Commonly known as: MEGACE   Take 10 mLs (400 mg total) by mouth 2 (two) times daily.      ondansetron 4 MG tablet   Commonly known as: ZOFRAN   Take 1 tablet (4 mg total) by mouth every 6 (six) hours as needed for nausea.      oxyCODONE 5 MG immediate release tablet   Commonly known as: Oxy IR/ROXICODONE   Take 1 tablet (5 mg total) by mouth every 4 (four) hours as needed.      oxyCODONE-acetaminophen 5-325 MG per tablet   Commonly known as: PERCOCET/ROXICET   Take 1-2 tablets by mouth every 4 (four) hours as needed.      pantoprazole 40 MG tablet   Commonly known as: PROTONIX   Take 1 tablet (40 mg total) by mouth every morning.      QUEtiapine 50 MG tablet   Commonly known as: SEROQUEL   Take 1 tablet (50 mg total) by mouth at bedtime as needed (sundowning).           Follow-up Information    Follow up with [facility]. (RN)    Contact information:   [address] [place] [address] [phone]      Follow up with [facility], MD. Schedule an appointment as soon as possible for a visit in 1 week. (1-2 weeks.)    Contact information:   [address] [place] [address] [phone]       Follow up with [facility], MD. Schedule an appointment as soon as possible for a visit in 1 week.   Contact information:   [address] [place] [address] [phone]          Signed: [name] 06/25/2012, 4:01 PM

## 2012-06-25 NOTE — Progress Notes (Signed)
19 Days Post-Op  Subjective: Pt doing well; no new c/o ; has ambulated in hallway; denies sig abd pain  Objective: Vital signs in last 24 hours: Temp:  [97.7 F (36.5 C)-97.9 F (36.6 C)] 97.7 F (36.5 C) (11/06 0612) Pulse Rate:  [71-72] 72  (11/06 0612) Resp:  [18] 18  (11/06 0612) BP: (115-139)/(61-75) 117/65 mmHg (11/06 0612) SpO2:  [96 %-100 %] 97 % (11/06 0612) Last BM Date: 06/25/12  Intake/Output from previous day: 11/05 0701 - 11/06 0700 In: 614 [P.O.:240; I.V.:374] Out: 1350 [Urine:1325; Drains:25] Intake/Output this shift:    Ant abd drain inatct, insertion site ok, NT, output about 25 cc's tan fluid  Lab Results:   [facility] 06/24/12 0437  WBC 7.3  HGB 11.6*  HCT 36.0*  PLT 285   BMET  Basename 06/25/12 0555 06/24/12 0437  NA 132* 130*  K 4.1 4.7  CL 101 102  CO2 20 15*  GLUCOSE 94 98  BUN 13 16  CREATININE 0.74 0.70  CALCIUM 8.7 8.2*   PT/INR No results found for this basename: LABPROT:2,INR:2 in the last 72 hours ABG No results found for this basename: PHART:2,PCO2:2,PO2:2,HCO3:2 in the last 72 hours Results for orders placed during the hospital encounter of 06/06/12  BODY FLUID CULTURE     Status: Normal   Collection Time   06/16/12  1:40 PM      Component Value Range Status Comment   Specimen Description     Final    Value: FLUID PERITONEAL CT GUIDED DRAINAGE CATHETER IN ANTERIOR ABDOMINAL FLUID COLLECTION   Special Requests NONE   Final    Gram Stain     Final    Value: FEW WBC PRESENT, PREDOMINANTLY MONONUCLEAR     NO ORGANISMS SEEN   Culture RARE ENTEROCOCCUS SPECIES   Final    Report Status 06/20/2012 FINAL   Final    Organism ID, Bacteria ENTEROCOCCUS SPECIES   Final   CLOSTRIDIUM DIFFICILE BY PCR     Status: Normal   Collection Time   06/18/12  1:58 PM      Component Value Range Status Comment   C difficile by pcr NEGATIVE  NEGATIVE Final     Studies/Results: No results found.  Anti-infectives: Anti-infectives     Start      Dose/Rate Route Frequency Ordered Stop   06/16/12 0200   vancomycin (VANCOCIN) IVPB 1000 mg/200 mL premix  Status:  Discontinued        1,000 mg 200 mL/hr over 60 Minutes Intravenous Every 8 hours 06/16/12 0137 06/17/12 0929   06/13/12 2359   vancomycin (VANCOCIN) 1,250 mg in sodium chloride 0.9 % 250 mL IVPB  Status:  Discontinued        1,250 mg 166.7 mL/hr over 90 Minutes Intravenous Every 12 hours 06/13/12 2335 06/16/12 0137   06/11/12 1000   vancomycin (VANCOCIN) IVPB 1000 mg/200 mL premix  Status:  Discontinued        1,000 mg 200 mL/hr over 60 Minutes Intravenous Every 12 hours 06/11/12 0924 06/13/12 2334   06/10/12 1000   piperacillin-tazobactam (ZOSYN) IVPB 3.375 g  Status:  Discontinued        3.375 g 12.5 mL/hr over 240 Minutes Intravenous Every 8 hours 06/10/12 0848 06/23/12 0857   06/06/12 1800   ertapenem (INVANZ) 1 g in sodium chloride 0.9 % 50 mL IVPB        1 g 100 mL/hr over 30 Minutes Intravenous Every 24 hours 06/06/12 1740 06/06/12 1906  06/05/12 1433   ertapenem (INVANZ) 1 g in sodium chloride 0.9 % 50 mL IVPB        1 g 100 mL/hr over 30 Minutes Intravenous 60 min pre-op 06/05/12 1433 06/06/12 1030          Assessment/Plan: s/p ant abd abscess drain 10/28; plans as per [facility]; would recheck CT before removing drain   LOS: 19 days    Tequan Redmon,D [facility] 06/25/2012

## 2012-06-26 ENCOUNTER — Telehealth (INDEPENDENT_AMBULATORY_CARE_PROVIDER_SITE_OTHER): Payer: Self-pay | Admitting: General Surgery

## 2012-06-26 NOTE — Telephone Encounter (Signed)
Huong, pharmacist, called to ask about a substitute for Creon-10.   Call her at [phone].

## 2012-06-27 ENCOUNTER — Telehealth (INDEPENDENT_AMBULATORY_CARE_PROVIDER_SITE_OTHER): Payer: Self-pay

## 2012-06-27 NOTE — Telephone Encounter (Signed)
Close encounter 

## 2012-06-27 NOTE — Telephone Encounter (Signed)
Confirmed with pharmacy that [facility] okay to substitute for Creon-10.

## 2012-07-03 ENCOUNTER — Other Ambulatory Visit (INDEPENDENT_AMBULATORY_CARE_PROVIDER_SITE_OTHER): Payer: Self-pay | Admitting: General Surgery

## 2012-07-03 ENCOUNTER — Ambulatory Visit (HOSPITAL_COMMUNITY)
Admission: RE | Admit: 2012-07-03 | Discharge: 2012-07-03 | Disposition: A | Discharge status: Home / Self Care | Payer: Medicaid Other | Source: Ambulatory Visit | Attending: General Surgery | Admitting: General Surgery

## 2012-07-03 ENCOUNTER — Telehealth (INDEPENDENT_AMBULATORY_CARE_PROVIDER_SITE_OTHER): Payer: Self-pay

## 2012-07-03 ENCOUNTER — Telehealth: Payer: Self-pay | Admitting: Oncology

## 2012-07-03 DIAGNOSIS — R066 Hiccough: Secondary | ICD-10-CM

## 2012-07-03 DIAGNOSIS — Z9889 Other specified postprocedural states: Secondary | ICD-10-CM | POA: Insufficient documentation

## 2012-07-03 NOTE — Telephone Encounter (Signed)
Please call in thorazine 25 mg po TID as needed for hiccups. Also, please get plain films, 3 way abdomen #30, 1 refill. tx FB

## 2012-07-03 NOTE — Telephone Encounter (Signed)
Colin Castro (patient daughter) calling in to report that patient has had several episodes of hiccups that last about 15 minutes or longer.  Daughter wasn't sure if this was typical after surgery.  Daughter aware that I will forward this information to Dr. Donell Beers and her nurse Cyndra Numbers for review.  Patient has follow-up appointment on 07/04/12 @ 11:45 am w/Dr. Donell Beers.

## 2012-07-03 NOTE — Telephone Encounter (Signed)
Close encounter 

## 2012-07-03 NOTE — Telephone Encounter (Signed)
S/W pt in re NP appt 11/22 @ 1:30 w/Dr. Truett Perna.  Welcome packet mailed.

## 2012-07-03 NOTE — Telephone Encounter (Signed)
Pt is to go for x-ray at [facility] today.  Rx for thorazine 25mg  po tid #12 called to [facility] on [address].  Pt's daughter is aware.

## 2012-07-04 ENCOUNTER — Ambulatory Visit (INDEPENDENT_AMBULATORY_CARE_PROVIDER_SITE_OTHER): Payer: PRIVATE HEALTH INSURANCE | Admitting: General Surgery

## 2012-07-04 ENCOUNTER — Encounter (INDEPENDENT_AMBULATORY_CARE_PROVIDER_SITE_OTHER): Payer: Self-pay | Admitting: General Surgery

## 2012-07-04 VITALS — BP 104/70 | HR 100 | Temp 99.6°F | Resp 18 | Ht 66.0 in | Wt 131.0 lb

## 2012-07-04 DIAGNOSIS — C241 Malignant neoplasm of ampulla of Vater: Secondary | ICD-10-CM

## 2012-07-04 NOTE — Patient Instructions (Signed)
Continue liberalizing diet.  We will make appt with Dr. Welton Flakes.    Follow up with me in 3 weeks.

## 2012-07-04 NOTE — Assessment & Plan Note (Signed)
Patient doing well postop.  Continue megace and creon.  He will see the oncologist at the cancer center.  The family is requesting that he see Dr. Welton Flakes for cultural reasons.  We'll see him back in 3 weeks.

## 2012-07-04 NOTE — Progress Notes (Signed)
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  Several days ago he was experiencing some hiccups.  KUB demonstrated mild ileus but no evidence of gastric distention. Thorazine improved symptoms.  His appetite is picking up.  He denies fever/ chills.  His drain has had < 5 cc total for 4 days.      EXAM: General:  Alert and oriented.   Incision:  Healing well.  Stitch protruding through skin on right.  This is trimmed.  Drain removed.     PATHOLOGY: pT3pN1 ampullary carcinoma.   ASSESSMENT AND PLAN:   Ampullary carcinoma Patient doing well postop.  Continue megace and creon.  He will see the oncologist at the cancer center.  The family is requesting that he see Dr. Khan for cultural reasons.  We'll see him back in 3 weeks.      Kashae Carstens L Mayley Lish, MD Surgical Oncology, General & Endocrine Surgery [facility]  KADAKIA,AJAY S, MD Kadakia, Ajay S, MD    

## 2012-07-07 ENCOUNTER — Telehealth: Payer: Self-pay | Admitting: Oncology

## 2012-07-07 NOTE — Telephone Encounter (Signed)
S/W pt in re NP appt 11/19 @ 1:30 w/Dr. Welton Flakes.

## 2012-07-08 ENCOUNTER — Ambulatory Visit: Payer: Self-pay

## 2012-07-08 ENCOUNTER — Telehealth: Payer: Self-pay | Admitting: Oncology

## 2012-07-08 ENCOUNTER — Ambulatory Visit: Payer: Self-pay | Admitting: Oncology

## 2012-07-08 ENCOUNTER — Other Ambulatory Visit: Payer: Self-pay | Admitting: Lab

## 2012-07-08 ENCOUNTER — Encounter: Payer: Self-pay | Admitting: Nutrition

## 2012-07-08 NOTE — Telephone Encounter (Signed)
C/D 07/08/12 for appt.07/11/12

## 2012-07-10 ENCOUNTER — Ambulatory Visit: Payer: Self-pay | Admitting: Oncology

## 2012-07-10 ENCOUNTER — Encounter: Payer: Self-pay | Admitting: *Deleted

## 2012-07-10 ENCOUNTER — Ambulatory Visit: Payer: Self-pay

## 2012-07-10 DIAGNOSIS — C241 Malignant neoplasm of ampulla of Vater: Secondary | ICD-10-CM

## 2012-07-11 ENCOUNTER — Ambulatory Visit (HOSPITAL_BASED_OUTPATIENT_CLINIC_OR_DEPARTMENT_OTHER): Payer: Medicaid Other | Admitting: Oncology

## 2012-07-11 ENCOUNTER — Ambulatory Visit: Payer: Medicaid Other

## 2012-07-11 ENCOUNTER — Encounter: Payer: Self-pay | Admitting: Oncology

## 2012-07-11 ENCOUNTER — Other Ambulatory Visit (HOSPITAL_BASED_OUTPATIENT_CLINIC_OR_DEPARTMENT_OTHER): Payer: Medicaid Other | Admitting: Lab

## 2012-07-11 VITALS — BP 112/78 | HR 86 | Temp 98.5°F | Resp 20 | Ht 66.0 in | Wt 133.0 lb

## 2012-07-11 DIAGNOSIS — C241 Malignant neoplasm of ampulla of Vater: Secondary | ICD-10-CM

## 2012-07-11 LAB — CBC WITH DIFFERENTIAL/PLATELET
Basophils Absolute: 0 10*3/uL (ref 0.0–0.1)
Eosinophils Absolute: 0.3 10*3/uL (ref 0.0–0.5)
HCT: 42.3 % (ref 38.4–49.9)
HGB: 14.3 g/dL (ref 13.0–17.1)
LYMPH%: 39.6 % (ref 14.0–49.0)
MCHC: 33.7 g/dL (ref 32.0–36.0)
MONO#: 0.6 10*3/uL (ref 0.1–0.9)
NEUT#: 4.1 10*3/uL (ref 1.5–6.5)
NEUT%: 48.8 % (ref 39.0–75.0)
Platelets: 221 10*3/uL (ref 140–400)
WBC: 8.5 10*3/uL (ref 4.0–10.3)
lymph#: 3.4 10*3/uL — ABNORMAL HIGH (ref 0.9–3.3)

## 2012-07-11 LAB — COMPREHENSIVE METABOLIC PANEL (CC13)
ALT: 157 U/L — ABNORMAL HIGH (ref 0–55)
CO2: 23 mEq/L (ref 22–29)
Calcium: 9.6 mg/dL (ref 8.4–10.4)
Chloride: 108 mEq/L — ABNORMAL HIGH (ref 98–107)
Creatinine: 1.3 mg/dL (ref 0.7–1.3)
Glucose: 106 mg/dl — ABNORMAL HIGH (ref 70–99)
Total Bilirubin: 0.51 mg/dL (ref 0.20–1.20)

## 2012-07-11 NOTE — Progress Notes (Signed)
Checked in new patient. No financial issues. °

## 2012-07-14 ENCOUNTER — Other Ambulatory Visit: Payer: Self-pay | Admitting: Emergency Medicine

## 2012-07-14 ENCOUNTER — Telehealth: Payer: Self-pay | Admitting: Emergency Medicine

## 2012-07-14 NOTE — Telephone Encounter (Signed)
Patient's daughter calling asking questions about plan of care for patient.  Unable to give her any information at this time.  She can be reached at [phone] as [place].

## 2012-07-21 ENCOUNTER — Telehealth: Payer: Self-pay | Admitting: Oncology

## 2012-07-21 ENCOUNTER — Telehealth: Payer: Self-pay | Admitting: *Deleted

## 2012-07-21 ENCOUNTER — Other Ambulatory Visit (INDEPENDENT_AMBULATORY_CARE_PROVIDER_SITE_OTHER): Payer: Self-pay | Admitting: General Surgery

## 2012-07-21 NOTE — Progress Notes (Signed)
[facility]  Telephone:(336) [phone] Fax:([phone]   MEDICAL ONCOLOGY - INITIAL CONSULATION    Referral MD  Dr. Everardo Beals  Reason for Referral: 66 year old gentleman with new diagnosis of stage III ampullary carcinoma.   Chief Complaint  Patient presents with  . new patient  : Papillary carcinoma (T3 N1 MX)   HPI: Patient is a very pleasant non-English-speaking 66 year old gentleman with medical history significant for hypertension hyperlipidemia coronary artery disease, and bile duct stricture. He was recently hospitalized on 06/06/2012 with biliary obstruction due to malignant neoplasm he was admitted to the ICU. 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 He underwent a classic pancreaticoduodenectomy with duct 2 mucosa pancreatic anastomosis placement of pancreatic stent and portal lymph node dissection. His final pathology revealed invasive well to moderately differentiated adenocarcinoma the tumor arose from high grade dysplasia involving small bowel mucosa in the ampulla invaded into the pancreas margins were negative lymphovascular invasion was identified one lymph node was positive for metastatic disease additional 14 lymph nodes were negative for metastatic disease. Portal lymph node resection revealed 5 benign lymph nodes. Pathologically patient was staged as P. T3 PN . Postoperatively has done well he is now seen in medical oncology for discussion of treatment options adjuvantly. All of the discussion was through an interpreter.     Past Medical History  Diagnosis Date  . Hypertension   . High cholesterol   . Coronary artery disease   . Common bile duct (CBD) stricture     Malignant  . Cancer   :  Past Surgical History  Procedure Date  . Ercp 05/07/2012    Procedure: ENDOSCOPIC RETROGRADE CHOLANGIOPANCREATOGRAPHY (ERCP);  Surgeon: [name], MD;  Location: [facility];  Service: Endoscopy;   Laterality: N/A;  Dr [name]  . Eus 05/07/2012    Procedure: UPPER ENDOSCOPIC ULTRASOUND (EUS) RADIAL;  Surgeon: [name], MD;  Location: [facility];  Service: Endoscopy;;  . Appendectomy     open, 25 yrs ago  . Coronary stent placement     2 stents placed  . Whipple procedure 06/06/2012    Procedure: WHIPPLE PROCEDURE;  Surgeon: [name], MD;  Location: [facility] OR;  Service: General;  Laterality: N/A;  Pancreatico duodenectomy  . Cholecystectomy 06/06/2012    Procedure: CHOLECYSTECTOMY;  Surgeon: [name], MD;  Location: [facility] OR;  Service: General;  Laterality: N/A;  . Pancreatic stent placement 06/06/2012    Procedure: PANCREATIC STENT PLACEMENT;  Surgeon: [name], MD;  Location: [facility] OR;  Service: General;  Laterality: N/A;  Removal of biliary stent; Placement of pacreatic stent  . Lymph node dissection 06/06/2012    Procedure: LYMPH NODE DISSECTION;  Surgeon: [name], MD;  Location: [facility] OR;  Service: General;  Laterality: N/A;  Portal lymph node dissection  :  Current Outpatient Prescriptions  Medication Sig Dispense Refill  . acetaminophen (TYLENOL) 500 MG tablet Take 500 mg by mouth every 6 (six) hours as needed. pain      . ALPRAZolam (XANAX) 0.25 MG tablet Take 1 tablet (0.25 mg total) by mouth 3 (three) times daily.  90 tablet  1  . lipase/protease/amylase (CREON-10/PANCREASE) 12000 UNITS CPEP Take 2 capsules by mouth 3 (three) times daily before meals.  270 capsule  2  . lisinopril (PRINIVIL,ZESTRIL) 20 MG tablet Take 20 mg by mouth daily.      . megestrol (MEGACE) 400 MG/10ML suspension Take 10 mLs (400 mg total) by mouth 2 (two) times daily.  240 mL  2  . ondansetron (ZOFRAN) 4 MG tablet Take 1 tablet (4 mg total) by mouth every 6 (six) hours as needed for nausea.  20 tablet  2  . oxyCODONE (OXY IR/ROXICODONE) 5 MG immediate release tablet Take 1 tablet (5 mg total) by mouth every 4 (four) hours as needed.  60 tablet  0  . oxyCODONE-acetaminophen  (PERCOCET/ROXICET) 5-325 MG per tablet Take 1-2 tablets by mouth every 4 (four) hours as needed.  60 tablet  0  . Multiple Vitamins-Minerals (CERTA-VITE PO) Take 1 tablet by mouth daily.      . pantoprazole (PROTONIX) 40 MG tablet Take 1 tablet (40 mg total) by mouth every morning.  30 tablet  3  . QUEtiapine (SEROQUEL) 50 MG tablet Take 1 tablet (50 mg total) by mouth at bedtime as needed (sundowning).  10 tablet  0     No Known Allergies:  No family history on file.:  History   Social History  . Marital Status: Married    Spouse Name: N/A    Number of Children: N/A  . Years of Education: N/A   Occupational History  . Not on file.   Social History Main Topics  . Smoking status: Current Every Day Smoker -- 0.2 packs/day for 48 years    Types: Cigarettes  . Smokeless tobacco: Never Used  . Alcohol Use: No  . Drug Use: No  . Sexually Active: No   Other Topics Concern  . Not on file   Social History Narrative  . No narrative on file  :  Constitutional: positive for anorexia and fatigue Eyes: negative Ears, nose, mouth, throat, and face: negative Respiratory: negative Cardiovascular: negative Gastrointestinal: positive for diarrhea Genitourinary:negative Integument/breast: negative Musculoskeletal:negative Neurological: negative Behavioral/Psych: negative  Exam: Filed Vitals:   07/11/12 1550  BP: 112/78  Pulse: 86  Temp: 98.5 F (36.9 C)  TempSrc: Oral  Resp: 20  Height: 5\' 6"  (1.676 m)  Weight: 133 lb (60.328 kg)     General:  Well developed tired thin male in no acute distress.  Eyes:  no scleral icterus.  ENT:  There were no oropharyngeal lesions.  Neck was without thyromegaly.  Lymphatics:  Negative cervical, supraclavicular or axillary adenopathy.  Respiratory: lungs were clear bilaterally without wheezing or crackles.  Cardiovascular:  Regular rate and rhythm, S1/S2, without murmur, rub or gallop.  There was no pedal edema.  GI:  abdomen was soft,  flat, nontender, nondistended, without organomegaly. Well-healed surgical scar is noted. Muscoloskeletal:  no spinal tenderness of palpation of vertebral spine.  Skin exam was without echymosis, petichae.  Neuro exam was nonfocal.  Patient was able to get on and off exam table without assistance.  Gait was normal.  Patient was alerted and oriented.  Attention was good.   Language was appropriate.  Mood was normal without depression.  Speech was not pressured.  Thought content was not tangential.     Lab Results  Component Value Date   WBC 8.5 07/11/2012   HGB 14.3 07/11/2012   HCT 42.3 07/11/2012   PLT 221 07/11/2012   GLUCOSE 106* 07/11/2012   CHOL 112 06/16/2012   TRIG 157* 06/19/2012   HDL 48 12/29/2009   LDLCALC 93 12/29/2009   ALT 157* 07/11/2012   AST 50* 07/11/2012   NA 136 07/11/2012   K 4.0 07/11/2012   CL 108* 07/11/2012   CREATININE 1.3 07/11/2012   BUN 18.0 07/11/2012   CO2 23 07/11/2012   TSH 0.700 06/17/2012  PSA 1.15 12/29/2009   INR 1.02 06/15/2012   HGBA1C 6.0* 06/06/2012    Dg Abd Acute W/chest  07/03/2012  *RADIOLOGY REPORT*  Clinical Data: Recent Whipple procedure.  Hiccups.  ACUTE ABDOMEN SERIES (ABDOMEN 2 VIEW & CHEST 1 VIEW)  Comparison: 05/27/2012; 06/15/2012  Findings: Persistent airspace opacity noted at the right lung base. Linear plate-like opacity in the left lower lobe and potentially mild retrocardiac airspace opacity on the left.  Faint interstitial accentuation in the lung bases, possibly due to the low lung volumes.  No cardiomegaly.  Mild tortuosity of the thoracic aorta. Prior right-sided central lines have been removed.  No pneumothorax.  No free peritoneal gas is observed beneath the hemidiaphragms. Clips project over the right upper quadrant.  On the upright view, there are several scattered air-fluid levels in nondilated small bowel.  A midline drainage catheter is noted.  No centralization of bowel loops to suggest ascites.  IMPRESSION:  1.   Postoperative findings in the upper abdomen likely reflecting the patients reported recent Whipple procedure.  No free air.  The right-sided central lines have been removed.  A midline drainage catheter is present projecting over the mid abdomen. 2.  There are scattered air-fluid levels in nondilated small bowel, abnormal but nonspecific, potentially reflecting slight ileus or mild enteritis.  Gas and formed stool present in the colon.  No dilated bowel to suggest obstruction. 3.  Persistent airspace opacity in the right lower lobe, with a lesser degree of airspace opacity in the left lower lobe. Pneumonia is not readily excluded given the radiographic appearance.   Original Report Authenticated By: Gaylyn Rong, M.D.     Pathology: Diagnosis 1. Whipple procedure/resection, and gallbladder - INVASIVE WELL TO MODERATELY DIFFERENTIATED ADENOCARCINOMA. - TUMOR ARISES FROM HIGH GRADE DYSPLASIA INVOLVING SMALL BOWEL MUCOSA IN THE AMPULLA. - TUMOR INVADES INTO THE PANCREAS. - MARGINS ARE NEGATIVE. - LYMPH/VASCULAR INVASION IS IDENTIFIED. - ONE LYMPH NODE POSITIVE FOR METASTATIC CARCINOMA (1/1). - FOURTEEN LYMPH NODE WITH NO TUMOR SEEN (0/15). - SEE ONCOLOGY TEMPLATE. 2. Lymph nodes, regional resection, portal - FIVE BENIGN LYMPH NODE WITH NO TUMOR SEE (0/5). Microscopic Comment 1. AMPULLA Histologic type: Adenocarcinoma. Grade: Intermediate to low grade (well to moderately differentiated). Margins: All margins negative. Lymph-Vascular invasion: Yes, identified. Microscopic tumor extension: Tumor invades into the pancreas. Lymph nodes: # examined 21 ; # positive 1. TNM code: pT3, pN1, MX. Comments: Tumor arises from the small bowel mucosa of the ampulla and invades into the pancreas. Dr. Frederica Kuster is in agreement that the tumor is a primary ampullary carcinoma. Zandra Abts MD Pathologist, Electronic Signature (Case signed 06/10/2012) Intraoperative Diagnosis RAPID INTRAOPERATIVE CONSULT:  PANCREATICODUODENECTOMY (WHIPPLE PROCEDURE): FROZEN SECTION Dg Abd Acute W/chest  07/03/2012  *RADIOLOGY REPORT*  Clinical Data: Recent Whipple procedure.  Hiccups.  ACUTE ABDOMEN SERIES (ABDOMEN 2 VIEW & CHEST 1 VIEW)  Comparison: 05/27/2012; 06/15/2012  Findings: Persistent airspace opacity noted at the right lung base. Linear plate-like opacity in the left lower lobe and potentially mild retrocardiac airspace opacity on the left.  Faint interstitial accentuation in the lung bases, possibly due to the low lung volumes.  No cardiomegaly.  Mild tortuosity of the thoracic aorta. Prior right-sided central lines have been removed.  No pneumothorax.  No free peritoneal gas is observed beneath the hemidiaphragms. Clips project over the right upper quadrant.  On the upright view, there are several scattered air-fluid levels in nondilated small bowel.  A midline drainage catheter is noted.  No centralization of bowel loops  to suggest ascites.  IMPRESSION:  1.  Postoperative findings in the upper abdomen likely reflecting the patients reported recent Whipple procedure.  No free air.  The right-sided central lines have been removed.  A midline drainage catheter is present projecting over the mid abdomen. 2.  There are scattered air-fluid levels in nondilated small bowel, abnormal but nonspecific, potentially reflecting slight ileus or mild enteritis.  Gas and formed stool present in the colon.  No dilated bowel to suggest obstruction. 3.  Persistent airspace opacity in the right lower lobe, with a lesser degree of airspace opacity in the left lower lobe. Pneumonia is not readily excluded given the radiographic appearance.   Original Report Authenticated By: Gaylyn Rong, M.D.     Assessment and Plan:  66 year old gentleman with new diagnosis of ampullary carcinoma T3 N1. Patient is a good candidate for adjuvant therapy. We discussed role of adjuvant chemotherapy and radiation therapy. All of the discussions were  performed through an interpreter. His wife was present at the discussions. The plan is as follows.  #1 patient will proceed with adjuvant chemotherapy initially consisting of Gemzar weekly 3 weeks on one week off for a total of one cycle.  #2 once he completes the Gemzar she will go on to get radiation therapy concurrently with Xeloda. A referral to radiation oncology will be made. Patient's wife has seen Dr. Antony Blackbird in the past and I will refer the patient to him.  #3 after completion of radiation therapy we will again give him adjuvant Gemzar for a total of 3-4 cycles. Again on schedule of 3 on 1 off. Risks and benefits of treatment were discussed with the patient and his wife.  #4 patient will need a Port-A-Cath placed we will also get staging studies for him. I have discussed nutrition with him and I do think he would benefit from nutritional counseling to try to get his weight up or at least maintaining the weight that he has.  Drue Second, MD Medical/Oncology [facility] [phone] (beeper) [phone] (Office)

## 2012-07-21 NOTE — Telephone Encounter (Signed)
Per staff message and POF I have scheduled appts.  JMW  

## 2012-07-21 NOTE — Telephone Encounter (Signed)
S/w the pt's dtr and she is aware of the dec 2013 appts.

## 2012-07-21 NOTE — Telephone Encounter (Signed)
The pt is scheduled to see dr Roselind Messier in dec. The pt's dtr is aware that they will be contacted with an appt for the pet/ct scans.

## 2012-07-21 NOTE — Telephone Encounter (Signed)
lmonvm of Colin Castro adviisng that the pt will need an interpreter for all of her appts for dec.

## 2012-07-22 ENCOUNTER — Encounter (HOSPITAL_COMMUNITY): Payer: Self-pay | Admitting: Pharmacy Technician

## 2012-07-23 ENCOUNTER — Encounter (HOSPITAL_COMMUNITY): Payer: Self-pay | Admitting: *Deleted

## 2012-07-23 ENCOUNTER — Telehealth: Payer: Self-pay | Admitting: Medical Oncology

## 2012-07-23 ENCOUNTER — Telehealth: Payer: Self-pay | Admitting: Oncology

## 2012-07-23 ENCOUNTER — Ambulatory Visit
Admission: RE | Admit: 2012-07-23 | Discharge: 2012-07-23 | Disposition: A | Discharge status: Home / Self Care | Payer: Medicare Other | Source: Ambulatory Visit | Attending: Radiation Oncology | Admitting: Radiation Oncology

## 2012-07-23 ENCOUNTER — Encounter: Payer: Self-pay | Admitting: Radiation Oncology

## 2012-07-23 VITALS — BP 134/72 | HR 108 | Temp 97.5°F | Wt 134.2 lb

## 2012-07-23 DIAGNOSIS — C241 Malignant neoplasm of ampulla of Vater: Secondary | ICD-10-CM

## 2012-07-23 DIAGNOSIS — C801 Malignant (primary) neoplasm, unspecified: Secondary | ICD-10-CM

## 2012-07-23 MED ORDER — CEFAZOLIN SODIUM-DEXTROSE 2-3 GM-% IV SOLR
2.0000 g | INTRAVENOUS | Status: AC
  Administered 2012-07-24: 2 g via INTRAVENOUS
  Filled 2012-07-23: qty 50

## 2012-07-23 NOTE — Progress Notes (Signed)
I did phone interview using interpretor [id number].

## 2012-07-23 NOTE — Progress Notes (Signed)
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 male who is seen out of the courtesy of Dr. Welton Flakes for an opinion concerning radiation therapy as part of the postoperative management of locally advanced ampullary carcinoma. The patient initially presented with biliary obstruction and proceeded to undergo Whipple procedure after extensive evaluation.  Pathology revealed a invasive well to moderately differentiated adenocarcinoma arising in the ampulla. The tumor did invade into the pancreas but surgical margins were clear. There was lymphovascular space invasion noted and in addition one out of 21 lymph nodes showed metastasis.  With these findings the patient was referred to Dr. Welton Flakes who has recommended adjuvant treatment.  PREVIOUS RADIATION THERAPY: No  PAST MEDICAL HISTORY:  has a past medical history of Hypertension; High cholesterol; Coronary artery disease; Common bile duct (CBD) stricture; Cancer (06/06/12 bx); Pneumonia; Constipation; and Myocardial infarction.    PAST SURGICAL HISTORY: Past Surgical History  Procedure Date  . Ercp 05/07/2012    Procedure: ENDOSCOPIC RETROGRADE CHOLANGIOPANCREATOGRAPHY (ERCP);  Surgeon: [name], MD;  Location: [facility];  Service: Endoscopy;  Laterality: N/A;  Dr [name]  . Eus 05/07/2012    Procedure: UPPER ENDOSCOPIC ULTRASOUND (EUS) RADIAL;  Surgeon: [name], MD;  Location: [facility];  Service: Endoscopy;;  . Appendectomy     open, 25 yrs ago  . Coronary stent placement     2 stents placed  . Whipple procedure 06/06/2012    Procedure: WHIPPLE PROCEDURE;  Surgeon: [name], MD;  Location:  [facility] OR;  Service: General;  Laterality: N/A;  Pancreatico duodenectomy  . Cholecystectomy 06/06/2012    Procedure: CHOLECYSTECTOMY;  Surgeon: [name], MD;  Location: [facility] OR;  Service: General;  Laterality: N/A;  . Pancreatic stent placement 06/06/2012    Procedure: PANCREATIC STENT PLACEMENT;  Surgeon: [name], MD;  Location: [facility] OR;  Service: General;  Laterality: N/A;  Removal of biliary stent; Placement of pacreatic stent  . Lymph node dissection 06/06/2012    Procedure: LYMPH NODE DISSECTION;  Surgeon: [name], MD;  Location: [facility] OR;  Service: General;  Laterality: N/A;  Portal lymph node dissection  . Coronary angioplasty     8 or 9 years ago    FAMILY HISTORY: family history includes Ovarian cancer in his mother.  SOCIAL HISTORY:  reports that he has been smoking Cigarettes.  He has a 9.6 pack-year smoking history. He has never used smokeless tobacco. He reports that he does not drink alcohol or use illicit drugs.  ALLERGIES: Review of patient's allergies indicates no known allergies.  MEDICATIONS:  Current Outpatient Prescriptions  Medication Sig Dispense Refill  . acetaminophen (TYLENOL) 500 MG tablet Take 500 mg by mouth every 6 (six) hours as needed. pain      . ALPRAZolam (XANAX) 0.25 MG tablet Take 1 tablet (0.25 mg total) by mouth 3 (three) times daily.  90 tablet  1  . lipase/protease/amylase (CREON-10/PANCREASE) 12000 UNITS CPEP Take 2 capsules by mouth 3 (three) times daily before meals.  270 capsule  2  . lisinopril (PRINIVIL,ZESTRIL) 20 MG tablet Take 20 mg by mouth daily.      . megestrol (MEGACE) 400 MG/10ML suspension Take  200 mg by mouth 2 (two) times daily.      . Multiple Vitamins-Minerals (CERTA-VITE PO) Take 1 tablet by mouth daily.      [name] oxyCODONE (OXY IR/ROXICODONE) 5 MG immediate release tablet Take 1 tablet (5 mg total) by mouth every 4 (four) hours as needed.  60 tablet  0  . Specialty Vitamins Products (MAGNESIUM, AMINO ACID CHELATE,) 133 MG  tablet Take 1 tablet by mouth daily.      [name] oxyCODONE-acetaminophen (PERCOCET/ROXICET) 5-325 MG per tablet Take 1 tablet by mouth every 4 (four) hours as needed.      [name] QUEtiapine (SEROQUEL) 50 MG tablet Take 50 mg by mouth at bedtime as needed.       No current facility-administered medications for this encounter.   Facility-Administered Medications Ordered in Other Encounters  Medication Dose Route Frequency Provider Last Rate Last Dose  . ceFAZolin (ANCEF) IVPB 2 g/50 mL premix  2 g Intravenous On Call to OR [name], MD        REVIEW OF SYSTEMS:  A 15 point review of systems is documented in the electronic medical record. This was obtained by the nursing staff. However, I reviewed this with the patient to discuss relevant findings and make appropriate changes.  He has had a poor appetite but this is been helped with use of Megace. He have soreness in the abdominal area but no actual pain.  He has early satiety.  Interview is with the assistance of an interpreter.   PHYSICAL EXAM:  weight is 134 lb 3.2 oz (60.873 kg). His temperature is 97.5 F (36.4 C). His blood pressure is 134/72 and his pulse is 108. His oxygen saturation is 100%.   BP 134/72  Pulse 108  Temp 97.5 F (36.4 C)  Wt 134 lb 3.2 oz (60.873 kg)  SpO2 100%  General Appearance:    Alert, cooperative, no distress, appears stated age  Head:    Normocephalic, without obvious abnormality, atraumatic  Eyes: The pupils are equal round and reactive to light the extraocular eye movements are intact.      Nose:   Nares normal, septum midline, mucosa normal, no drainage    or sinus tenderness  Throat:   Lips, mucosa, and tongue normal; poor dentition   Neck:   Supple, symmetrical, trachea midline, no adenopathy;       thyroid:  No enlargement/tenderness/nodules; no carotid   bruit or JVD  Back:     Symmetric, no curvature, ROM normal, no CVA tenderness  Lungs:     Clear to auscultation bilaterally, respirations unlabored    Chest wall:    No tenderness or deformity  Heart:    Regular rate and rhythm,  no murmur, rub   or gallop  Abdomen:     Soft, non-tender, bowel sounds active all four quadrants,    no masses, no organomegaly,  several scars present from his recent surgery no signs of drainage or infection         Extremities:   Extremities normal, atraumatic, no cyanosis or edema  Pulses:   2+ and symmetric all extremities  Skin:   Skin color, texture, turgor normal, no rashes or lesions  Lymph nodes:   Cervical, supraclavicular, and axillary nodes normal  Neurologic:   CNII-XII intact. Normal strength, sensation and reflexes      throughout    LABORATORY DATA:  Lab Results  Component Value Date   WBC 8.5 07/11/2012   HGB 14.3 07/11/2012   HCT  42.3 07/11/2012   MCV 82.7 07/11/2012   PLT 221 07/11/2012   Lab Results  Component Value Date   NA 136 07/11/2012   K 4.0 07/11/2012   CL 108* 07/11/2012   CO2 23 07/11/2012   Lab Results  Component Value Date   ALT 157* 07/11/2012   AST 50* 07/11/2012   ALKPHOS 152* 07/11/2012   BILITOT 0.51 07/11/2012     RADIOGRAPHY: Dg Abd Acute W/chest  07/03/2012  *RADIOLOGY REPORT*  Clinical Data: Recent Whipple procedure.  Hiccups.  ACUTE ABDOMEN SERIES (ABDOMEN 2 VIEW & CHEST 1 VIEW)  Comparison: 05/27/2012; 06/15/2012  Findings: Persistent airspace opacity noted at the right lung base. Linear plate-like opacity in the left lower lobe and potentially mild retrocardiac airspace opacity on the left.  Faint interstitial accentuation in the lung bases, possibly due to the low lung volumes.  No cardiomegaly.  Mild tortuosity of the thoracic aorta. Prior right-sided central lines have been removed.  No pneumothorax.  No free peritoneal gas is observed beneath the hemidiaphragms. Clips project over the right upper quadrant.  On the upright view, there are several scattered air-fluid levels in nondilated small bowel.  A midline drainage catheter is noted.  No  centralization of bowel loops to suggest ascites.  IMPRESSION:  1.  Postoperative findings in the upper abdomen likely reflecting the patients reported recent Whipple procedure.  No free air.  The right-sided central lines have been removed.  A midline drainage catheter is present projecting over the mid abdomen. 2.  There are scattered air-fluid levels in nondilated small bowel, abnormal but nonspecific, potentially reflecting slight ileus or mild enteritis.  Gas and formed stool present in the colon.  No dilated bowel to suggest obstruction. 3.  Persistent airspace opacity in the right lower lobe, with a lesser degree of airspace opacity in the left lower lobe. Pneumonia is not readily excluded given the radiographic appearance.   Original Report Authenticated By: Gaylyn Rong, M.D.       IMPRESSION: Stage II-B adenocarcinoma originating in the ampulla. Given the above stage the patient would be at risk for local regional recurrence and I would recommend post operative radiation therapy along with adjuvant chemotherapy as planned by Dr. Welton Flakes.  PLAN:   The patient will proceed with one cycle of chemotherapy followed by radiation therapy directed at the operative bed along with radiosensitizing chemotherapy.   I spent 60  minutes face to face with the patient and more than 50% of that time was spent in counseling and/or coordination of care.   ------------------------------------------------    Billie Lade, PhD, MD

## 2012-07-23 NOTE — Progress Notes (Signed)
Please see the Nurse Progress Note in the MD Initial Consult Encounter for this patient. 

## 2012-07-23 NOTE — Telephone Encounter (Signed)
Pt's dtr called requesting to move some appts from 07/30/2012 to 12/23/ was not sure of the answers to the question. Transferred the call over to the desk nurse mira.

## 2012-07-23 NOTE — Telephone Encounter (Signed)
Patient's daughter, Qadir Folks, called asking if it is possible to change his 1st Chemo treatment appt., scheduled on 07/30/12 @ 1300, to 08/11/12 because she would like to be here during that treatment. [place] and is unable to be here for the 07/30/12 appointment. She is also requesting if his [facility] placement appointment can be moved to sometime between 12/9 - 12/16th and if so she will call to reschedule it. Port placement is currently scheduled for 07/24/12. Informed daughter that I will give message to Dr. Welton Flakes.

## 2012-07-23 NOTE — Telephone Encounter (Signed)
Please have the patient come in next Monday for chemotherapy teaching class so we can get the chemotherapy started on 12/11. i have already spoke to the daughter and she understands the necessity of starting the chemo on 12/11

## 2012-07-23 NOTE — Telephone Encounter (Signed)
S/w the pt's family friend and she is aware of the r/s chemo educ class appt from 07/24/2012 to 07/29/2012

## 2012-07-23 NOTE — Progress Notes (Signed)
Patient , spouse and [place] interpreter here for radiation consultation of ampullary cancer.Current plan for chemotherapy (gemzar) weekly x3 , followed by radiation And then adjuvant gemzar again.

## 2012-07-23 NOTE — Telephone Encounter (Signed)
Colin Castro's, daughter, contact number is [phone].

## 2012-07-24 ENCOUNTER — Encounter (HOSPITAL_COMMUNITY): Payer: Self-pay | Admitting: *Deleted

## 2012-07-24 ENCOUNTER — Other Ambulatory Visit: Payer: Medicaid Other

## 2012-07-24 ENCOUNTER — Ambulatory Visit (HOSPITAL_COMMUNITY): Payer: Medicare Other | Admitting: *Deleted

## 2012-07-24 ENCOUNTER — Ambulatory Visit (HOSPITAL_COMMUNITY): Payer: Medicare Other

## 2012-07-24 ENCOUNTER — Encounter (HOSPITAL_COMMUNITY): Payer: Self-pay | Admitting: Pharmacy Technician

## 2012-07-24 ENCOUNTER — Encounter (HOSPITAL_COMMUNITY): Payer: Self-pay | Admitting: General Surgery

## 2012-07-24 ENCOUNTER — Ambulatory Visit (HOSPITAL_COMMUNITY)
Admission: RE | Admit: 2012-07-24 | Discharge: 2012-07-24 | Disposition: A | Discharge status: Home / Self Care | Payer: Medicare Other | Source: Ambulatory Visit | Attending: General Surgery | Admitting: General Surgery

## 2012-07-24 ENCOUNTER — Encounter (HOSPITAL_COMMUNITY)
Admission: RE | Disposition: A | Discharge status: Home / Self Care | Payer: Self-pay | Source: Ambulatory Visit | Attending: General Surgery

## 2012-07-24 DIAGNOSIS — C241 Malignant neoplasm of ampulla of Vater: Secondary | ICD-10-CM | POA: Insufficient documentation

## 2012-07-24 DIAGNOSIS — I10 Essential (primary) hypertension: Secondary | ICD-10-CM | POA: Insufficient documentation

## 2012-07-24 DIAGNOSIS — C259 Malignant neoplasm of pancreas, unspecified: Secondary | ICD-10-CM

## 2012-07-24 HISTORY — DX: Constipation, unspecified: K59.00

## 2012-07-24 HISTORY — PX: PORTACATH PLACEMENT: SHX2246

## 2012-07-24 HISTORY — DX: Acute myocardial infarction, unspecified: I21.9

## 2012-07-24 HISTORY — DX: Pneumonia, unspecified organism: J18.9

## 2012-07-24 LAB — BASIC METABOLIC PANEL
BUN: 13 mg/dL (ref 6–23)
Calcium: 9.6 mg/dL (ref 8.4–10.5)
GFR calc Af Amer: >90 mL/min (ref >90)
GFR calc non Af Amer: 87 mL/min — ABNORMAL LOW (ref >90)
Glucose, Bld: 100 mg/dL — ABNORMAL HIGH (ref 70–99)
Potassium: 4.5 mEq/L (ref 3.5–5.1)

## 2012-07-24 LAB — URINALYSIS, ROUTINE W REFLEX MICROSCOPIC
Ketones, ur: NEGATIVE mg/dL
Leukocytes, UA: NEGATIVE
Nitrite: NEGATIVE
Protein, ur: NEGATIVE mg/dL
Urobilinogen, UA: 1 mg/dL (ref 0.0–1.0)

## 2012-07-24 LAB — CBC
Hemoglobin: 14.4 g/dL (ref 13.0–17.0)
MCH: 27.5 pg (ref 26.0–34.0)
MCHC: 34.1 g/dL (ref 30.0–36.0)
Platelets: 181 10*3/uL (ref 150–400)
RDW: 15.6 % — ABNORMAL HIGH (ref 11.5–15.5)

## 2012-07-24 LAB — SURGICAL PCR SCREEN: MRSA, PCR: POSITIVE — AB

## 2012-07-24 SURGERY — INSERTION, TUNNELED CENTRAL VENOUS DEVICE, WITH PORT
Anesthesia: General | Site: Chest | Laterality: Left | Wound class: Clean

## 2012-07-24 MED ORDER — FENTANYL CITRATE 0.05 MG/ML IJ SOLN: 25.0000 ug | INTRAMUSCULAR | Status: DC | PRN

## 2012-07-24 MED ORDER — 0.9 % SODIUM CHLORIDE (POUR BTL) OPTIME
TOPICAL | Status: DC | PRN
  Administered 2012-07-24: 1000 mL

## 2012-07-24 MED ORDER — LACTATED RINGERS IV SOLN
INTRAVENOUS | Status: DC
  Administered 2012-07-24: 10:00:00 via INTRAVENOUS

## 2012-07-24 MED ORDER — LIDOCAINE HCL (CARDIAC) 20 MG/ML IV SOLN
INTRAVENOUS | Status: DC | PRN
  Administered 2012-07-24: 60 mg via INTRAVENOUS

## 2012-07-24 MED ORDER — PROPOFOL 10 MG/ML IV BOLUS
INTRAVENOUS | Status: DC | PRN
  Administered 2012-07-24: 40 mg via INTRAVENOUS
  Administered 2012-07-24: 170 mg via INTRAVENOUS

## 2012-07-24 MED ORDER — SODIUM CHLORIDE 0.9 % IV SOLN
10.0000 mg | INTRAVENOUS | Status: DC | PRN
  Administered 2012-07-24: 5 ug/min via INTRAVENOUS

## 2012-07-24 MED ORDER — OXYCODONE HCL 5 MG PO TABS: 5.0000 mg | ORAL_TABLET | Freq: Once | ORAL | Status: DC | PRN

## 2012-07-24 MED ORDER — HEPARIN SOD (PORK) LOCK FLUSH 100 UNIT/ML IV SOLN
INTRAVENOUS | Status: DC | PRN
  Administered 2012-07-24: 500 [IU] via INTRAVENOUS

## 2012-07-24 MED ORDER — BUPIVACAINE-EPINEPHRINE PF 0.25-1:200000 % IJ SOLN
INTRAMUSCULAR | Status: AC
  Filled 2012-07-24: qty 30

## 2012-07-24 MED ORDER — LACTATED RINGERS IV SOLN
INTRAVENOUS | Status: DC | PRN
  Administered 2012-07-24 (×2): via INTRAVENOUS

## 2012-07-24 MED ORDER — LIDOCAINE HCL (PF) 1 % IJ SOLN
INTRAMUSCULAR | Status: AC
  Filled 2012-07-24: qty 30

## 2012-07-24 MED ORDER — SODIUM CHLORIDE 0.9 % IR SOLN
Status: DC | PRN
  Administered 2012-07-24: 11:00:00

## 2012-07-24 MED ORDER — MUPIROCIN 2 % EX OINT
TOPICAL_OINTMENT | CUTANEOUS | Status: AC
  Filled 2012-07-24: qty 22

## 2012-07-24 MED ORDER — ONDANSETRON HCL 4 MG/2ML IJ SOLN: 4.0000 mg | Freq: Four times a day (QID) | INTRAMUSCULAR | Status: DC | PRN

## 2012-07-24 MED ORDER — HEPARIN SOD (PORK) LOCK FLUSH 100 UNIT/ML IV SOLN
INTRAVENOUS | Status: AC
  Filled 2012-07-24: qty 5

## 2012-07-24 MED ORDER — MIDAZOLAM HCL 5 MG/5ML IJ SOLN
INTRAMUSCULAR | Status: DC | PRN
  Administered 2012-07-24 (×2): 1 mg via INTRAVENOUS

## 2012-07-24 MED ORDER — OXYCODONE-ACETAMINOPHEN 5-325 MG PO TABS: 1.0000 | ORAL_TABLET | ORAL | Status: DC | PRN

## 2012-07-24 MED ORDER — LIDOCAINE HCL 1 % IJ SOLN
INTRAMUSCULAR | Status: DC | PRN
  Administered 2012-07-24: 11:00:00

## 2012-07-24 MED ORDER — ONDANSETRON HCL 4 MG/2ML IJ SOLN
INTRAMUSCULAR | Status: DC | PRN
  Administered 2012-07-24: 4 mg via INTRAVENOUS

## 2012-07-24 MED ORDER — OXYCODONE HCL 5 MG/5ML PO SOLN: 5.0000 mg | Freq: Once | ORAL | Status: DC | PRN

## 2012-07-24 MED ORDER — FENTANYL CITRATE 0.05 MG/ML IJ SOLN
INTRAMUSCULAR | Status: DC | PRN
  Administered 2012-07-24: 50 ug via INTRAVENOUS

## 2012-07-24 SURGICAL SUPPLY — 54 items
BAG DECANTER FOR FLEXI CONT (MISCELLANEOUS) ×2 IMPLANT
BLADE SURG 11 STRL SS (BLADE) ×2 IMPLANT
BLADE SURG 15 STRL LF DISP TIS (BLADE) ×1 IMPLANT
BLADE SURG 15 STRL SS (BLADE) ×1
CANISTER SUCTION 2500CC (MISCELLANEOUS) IMPLANT
CHLORAPREP W/TINT 10.5 ML (MISCELLANEOUS) ×2 IMPLANT
CLOTH BEACON ORANGE TIMEOUT ST (SAFETY) ×2 IMPLANT
COVER DRAPE ULTRASOUND 610 023 (DRAPES) IMPLANT
COVER SURGICAL LIGHT HANDLE (MISCELLANEOUS) ×2 IMPLANT
CRADLE DONUT ADULT HEAD (MISCELLANEOUS) ×2 IMPLANT
DECANTER SPIKE VIAL GLASS SM (MISCELLANEOUS) IMPLANT
DERMABOND ADHESIVE PROPEN (GAUZE/BANDAGES/DRESSINGS) ×1
DERMABOND ADVANCED (GAUZE/BANDAGES/DRESSINGS) ×1
DERMABOND ADVANCED .7 DNX12 (GAUZE/BANDAGES/DRESSINGS) ×1 IMPLANT
DERMABOND ADVANCED .7 DNX6 (GAUZE/BANDAGES/DRESSINGS) ×1 IMPLANT
DRAPE C-ARM 42X72 X-RAY (DRAPES) ×2 IMPLANT
DRAPE LAPAROSCOPIC ABDOMINAL (DRAPES) ×2 IMPLANT
DRAPE UTILITY 15X26 W/TAPE STR (DRAPE) ×4 IMPLANT
DRAPE WARM FLUID 44X44 (DRAPE) IMPLANT
ELECT CAUTERY BLADE 6.4 (BLADE) ×2 IMPLANT
ELECT REM PT RETURN 9FT ADLT (ELECTROSURGICAL) ×2
ELECTRODE REM PT RTRN 9FT ADLT (ELECTROSURGICAL) ×1 IMPLANT
GAUZE SPONGE 4X4 16PLY XRAY LF (GAUZE/BANDAGES/DRESSINGS) ×2 IMPLANT
GLOVE BIO SURGEON STRL SZ 6 (GLOVE) ×2 IMPLANT
GLOVE BIO SURGEON STRL SZ7.5 (GLOVE) ×2 IMPLANT
GLOVE BIOGEL PI IND STRL 6.5 (GLOVE) ×1 IMPLANT
GLOVE BIOGEL PI IND STRL 7.0 (GLOVE) ×1 IMPLANT
GLOVE BIOGEL PI INDICATOR 6.5 (GLOVE) ×1
GLOVE BIOGEL PI INDICATOR 7.0 (GLOVE) ×1
GLOVE SURG SS PI 7.0 STRL IVOR (GLOVE) ×2 IMPLANT
GOWN PREVENTION PLUS XXLARGE (GOWN DISPOSABLE) ×2 IMPLANT
GOWN STRL NON-REIN LRG LVL3 (GOWN DISPOSABLE) ×2 IMPLANT
KIT BASIN OR (CUSTOM PROCEDURE TRAY) ×2 IMPLANT
KIT PORT POWER 9.6FR MRI PREA (Catheter) IMPLANT
KIT PORT POWER ISP 8FR (Catheter) IMPLANT
KIT POWER CATH 8FR (Catheter) ×2 IMPLANT
KIT ROOM TURNOVER OR (KITS) ×2 IMPLANT
NEEDLE HYPO 25GX1X1/2 BEV (NEEDLE) ×2 IMPLANT
NS IRRIG 1000ML POUR BTL (IV SOLUTION) ×2 IMPLANT
PACK SURGICAL SETUP 50X90 (CUSTOM PROCEDURE TRAY) ×2 IMPLANT
PAD ARMBOARD 7.5X6 YLW CONV (MISCELLANEOUS) ×4 IMPLANT
PENCIL BUTTON HOLSTER BLD 10FT (ELECTRODE) ×2 IMPLANT
SUT MON AB 4-0 PC3 18 (SUTURE) ×2 IMPLANT
SUT PROLENE 2 0 SH DA (SUTURE) ×4 IMPLANT
SUT VIC AB 3-0 SH 27 (SUTURE) ×1
SUT VIC AB 3-0 SH 27X BRD (SUTURE) ×1 IMPLANT
SYR 20ML ECCENTRIC (SYRINGE) ×4 IMPLANT
SYR 5ML LUER SLIP (SYRINGE) ×2 IMPLANT
SYR CONTROL 10ML LL (SYRINGE) ×2 IMPLANT
TOWEL OR 17X24 6PK STRL BLUE (TOWEL DISPOSABLE) ×2 IMPLANT
TOWEL OR 17X26 10 PK STRL BLUE (TOWEL DISPOSABLE) ×2 IMPLANT
TUBE CONNECTING 12X1/4 (SUCTIONS) IMPLANT
WATER STERILE IRR 1000ML POUR (IV SOLUTION) IMPLANT
YANKAUER SUCT BULB TIP NO VENT (SUCTIONS) IMPLANT

## 2012-07-24 NOTE — Op Note (Signed)
PREOPERATIVE DIAGNOSIS:  Ampullary carcinoma     POSTOPERATIVE DIAGNOSIS:  Same     PROCEDURE: L subclavian ultrasound-guided port placement, Bard   Power Port, MRI safe, 8-French.      SURGEON:  [name], MD      ANESTHESIA:  General   FINDINGS:  Good venous return, easy flush, and tip of the catheter and   SVC 22.5 cm.      SPECIMEN:  None.      ESTIMATED BLOOD LOSS:  Minimal.      COMPLICATIONS:  None known.      PROCEDURE:  Pt was identified in the holding area and taken to   the operating room, where patient was placed supine on the operating room   table.  MAC anesthesia was induced.  Patient's arms were tucked and the upper   chest and neck were prepped and draped in sterile fashion.  Time-out was   performed according to the surgical safety check list.  When all was   correct, we continued.   Local anesthetic was administered over this   area at the angle of the clavicle.  The vein was accessed with 2 passes of the needle. There was good venous return and the wire passed easily with no ectopy.   Fluoroscopy was used to confirm that the wire was in the vena cava.      The patient was placed back level and the area for the pocket was anethetized   with local anesthetic.  A 3-cm transverse incision was made with a #15   blade.  Cautery was used to divide the subcutaneous tissues down to the   pectoralis muscle.  An [employer] retractor was used to elevate the skin   while a pocket was created on top of the pectoralis fascia.  The port   was placed into the pocket to confirm that it was of adequate size.  The   catheter was preattached to the port.  The port was then secured to the   pectoralis fascia with four 2-0 Prolene sutures.  These were clamped and   not tied down yet.    The catheter was tunneled through to the wire exit   site.  The catheter was placed along the wire to determine what length it should be to be in the SVC.  The catheter was cut at 22.5 cm.  The  tunneler sheath and dilator were passed over the wire and the dilator and wire were removed.  The catheter was advanced through the tunneler sheath and the tunneler sheath was pulled away.  Care was taken to keep the catheter in the tunneler sheath as this occurred. This was advanced and the tunneler sheath was removed.  There was good venous   return and easy flush of the catheter.  The Prolene sutures were tied   down to the pectoral fascia.  The skin was reapproximated using 3-0   Vicryl interrupted deep dermal sutures.    Fluoroscopy was used to re-confirm good position of the catheter.  The skin   was then closed using 4-0 Monocryl in a subcuticular fashion.  The port was flushed with concentrated heparin flush as well.  The wounds were then cleaned, dried, and dressed with Dermabond.  The patient was awakened from anesthesia and taken to the PACU in stable condition.  Needle, sponge, and instrument counts were correct.               [name], MD

## 2012-07-24 NOTE — H&P (View-Only) (Signed)
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  Several days ago he was experiencing some hiccups.  KUB demonstrated mild ileus but no evidence of gastric distention. Thorazine improved symptoms.  His appetite is picking up.  He denies fever/ chills.  His drain has had < 5 cc total for 4 days.      EXAM: General:  Alert and oriented.   Incision:  Healing well.  Stitch protruding through skin on right.  This is trimmed.  Drain removed.     PATHOLOGY: pT3pN1 ampullary carcinoma.   ASSESSMENT AND PLAN:   Ampullary carcinoma Patient doing well postop.  Continue megace and creon.  He will see the oncologist at the cancer center.  The family is requesting that he see Dr. Welton Flakes for cultural reasons.  We'll see him back in 3 weeks.      Maudry Diego, MD Surgical Oncology, General & Endocrine Surgery [facility]  [name], MD [name], MD

## 2012-07-24 NOTE — Interval H&P Note (Signed)
History and Physical Interval Note:  07/24/2012 9:48 AM  Colin Castro  has presented today for surgery, with the diagnosis of ampullary carcinoma  The various methods of treatment have been discussed with the patient and family. After consideration of risks, benefits and other options for treatment, the patient has consented to  Procedure(s) (LRB) with comments: INSERTION PORT-A-CATH (N/A) as a surgical intervention .  The patient's history has been reviewed, patient examined, no change in status, stable for surgery.  I have reviewed the patient's chart and labs.  Questions were answered to the patient's satisfaction.     Zelig Gacek

## 2012-07-24 NOTE — Preoperative (Signed)
Beta Blockers   Reason not to administer Beta Blockers:Not Applicable 

## 2012-07-24 NOTE — Anesthesia Preprocedure Evaluation (Signed)
Anesthesia Evaluation  Patient identified by MRN, date of birth, ID band Patient awake    Reviewed: Allergy & Precautions, H&P , NPO status , Patient's Chart, lab work & pertinent test results  Airway Mallampati: II  Neck ROM: full    Dental   Pulmonary          Cardiovascular hypertension, + CAD, + Past MI and + Cardiac Stents     Neuro/Psych    GI/Hepatic   Endo/Other    Renal/GU      Musculoskeletal   Abdominal   Peds  Hematology   Anesthesia Other Findings   Reproductive/Obstetrics                           Anesthesia Physical Anesthesia Plan  ASA: II  Anesthesia Plan: General   Post-op Pain Management:    Induction: Intravenous  Airway Management Planned: LMA  Additional Equipment:   Intra-op Plan:   Post-operative Plan:   Informed Consent: I have reviewed the patients History and Physical, chart, labs and discussed the procedure including the risks, benefits and alternatives for the proposed anesthesia with the patient or authorized representative who has indicated his/her understanding and acceptance.     Plan Discussed with: CRNA and Surgeon  Anesthesia Plan Comments:         Anesthesia Quick Evaluation

## 2012-07-24 NOTE — Transfer of Care (Signed)
Immediate Anesthesia Transfer of Care Note  Patient: Colin Castro  Procedure(s) Performed: Procedure(s) (LRB) with comments: INSERTION PORT-A-CATH (Left)  Patient Location: PACU  Anesthesia Type:General  Level of Consciousness: awake, alert  and oriented  Airway & Oxygen Therapy: Patient Spontanous Breathing and Patient connected to nasal cannula oxygen  Post-op Assessment: Report given to PACU RN and Post -op Vital signs reviewed and stable  Post vital signs: Reviewed and stable  Complications: No apparent anesthesia complications

## 2012-07-25 ENCOUNTER — Ambulatory Visit (HOSPITAL_COMMUNITY)
Admission: RE | Admit: 2012-07-25 | Discharge: 2012-07-25 | Disposition: A | Discharge status: Home / Self Care | Payer: Medicare Other | Source: Ambulatory Visit | Attending: Oncology | Admitting: Oncology

## 2012-07-25 ENCOUNTER — Encounter (HOSPITAL_COMMUNITY)
Admission: RE | Admit: 2012-07-25 | Discharge: 2012-07-25 | Disposition: A | Discharge status: Home / Self Care | Payer: Medicare Other | Source: Ambulatory Visit | Attending: Oncology | Admitting: Oncology

## 2012-07-25 ENCOUNTER — Encounter (HOSPITAL_COMMUNITY): Payer: Self-pay | Admitting: General Surgery

## 2012-07-25 DIAGNOSIS — C241 Malignant neoplasm of ampulla of Vater: Secondary | ICD-10-CM

## 2012-07-25 DIAGNOSIS — C259 Malignant neoplasm of pancreas, unspecified: Secondary | ICD-10-CM | POA: Insufficient documentation

## 2012-07-25 LAB — GLUCOSE, CAPILLARY: Glucose-Capillary: 90 mg/dL (ref 70–99)

## 2012-07-25 MED ORDER — IOHEXOL 300 MG/ML  SOLN
100.0000 mL | Freq: Once | INTRAMUSCULAR | Status: AC | PRN
  Administered 2012-07-25: 100 mL via INTRAVENOUS

## 2012-07-25 MED ORDER — FLUDEOXYGLUCOSE F - 18 (FDG) INJECTION
17.9000 | Freq: Once | INTRAVENOUS | Status: AC | PRN
  Administered 2012-07-25: 17.9 via INTRAVENOUS

## 2012-07-25 MED FILL — Mupirocin Oint 2%: CUTANEOUS | Qty: 22 | Status: AC

## 2012-07-25 NOTE — Addendum Note (Signed)
Encounter addended by: Delynn Flavin, RN on: 07/25/2012  5:01 PM<BR>     Documentation filed: Charges VN

## 2012-07-28 ENCOUNTER — Encounter (INDEPENDENT_AMBULATORY_CARE_PROVIDER_SITE_OTHER): Payer: PRIVATE HEALTH INSURANCE | Admitting: General Surgery

## 2012-07-29 ENCOUNTER — Encounter: Payer: Self-pay | Admitting: *Deleted

## 2012-07-29 ENCOUNTER — Other Ambulatory Visit: Payer: Medicare Other

## 2012-07-29 NOTE — Anesthesia Postprocedure Evaluation (Signed)
Anesthesia Post Note  Patient: Colin Castro  Procedure(s) Performed: Procedure(s) (LRB): INSERTION PORT-A-CATH (Left)  Anesthesia type: General  Patient location: PACU  Post pain: Pain level controlled and Adequate analgesia  Post assessment: Post-op Vital signs reviewed, Patient's Cardiovascular Status Stable, Respiratory Function Stable, Patent Airway and Pain level controlled  Last Vitals:  Filed Vitals:   07/24/12 1221  BP: 150/71  Pulse: 89  Temp: 36.5 C  Resp: 18    Post vital signs: Reviewed and stable  Level of consciousness: awake, alert  and oriented  Complications: No apparent anesthesia complications

## 2012-07-30 ENCOUNTER — Ambulatory Visit (HOSPITAL_BASED_OUTPATIENT_CLINIC_OR_DEPARTMENT_OTHER): Payer: Medicare Other | Admitting: Oncology

## 2012-07-30 ENCOUNTER — Encounter: Payer: Self-pay | Admitting: Oncology

## 2012-07-30 ENCOUNTER — Other Ambulatory Visit: Payer: Self-pay | Admitting: Certified Registered Nurse Anesthetist

## 2012-07-30 ENCOUNTER — Telehealth: Payer: Self-pay | Admitting: Oncology

## 2012-07-30 ENCOUNTER — Telehealth: Payer: Self-pay | Admitting: Medical Oncology

## 2012-07-30 ENCOUNTER — Ambulatory Visit (HOSPITAL_BASED_OUTPATIENT_CLINIC_OR_DEPARTMENT_OTHER): Payer: Medicare Other

## 2012-07-30 ENCOUNTER — Ambulatory Visit: Payer: Medicaid Other | Admitting: Oncology

## 2012-07-30 ENCOUNTER — Other Ambulatory Visit (HOSPITAL_BASED_OUTPATIENT_CLINIC_OR_DEPARTMENT_OTHER): Payer: Medicare Other | Admitting: Lab

## 2012-07-30 ENCOUNTER — Telehealth: Payer: Self-pay | Admitting: *Deleted

## 2012-07-30 VITALS — BP 112/80 | HR 103 | Temp 97.0°F | Resp 20 | Ht 66.0 in | Wt 132.3 lb

## 2012-07-30 DIAGNOSIS — C241 Malignant neoplasm of ampulla of Vater: Secondary | ICD-10-CM

## 2012-07-30 DIAGNOSIS — Z5111 Encounter for antineoplastic chemotherapy: Secondary | ICD-10-CM

## 2012-07-30 DIAGNOSIS — C801 Malignant (primary) neoplasm, unspecified: Secondary | ICD-10-CM

## 2012-07-30 LAB — CBC WITH DIFFERENTIAL/PLATELET
Eosinophils Absolute: 0.1 10*3/uL (ref 0.0–0.5)
HCT: 43 % (ref 38.4–49.9)
LYMPH%: 37.2 % (ref 14.0–49.0)
MONO#: 0.8 10*3/uL (ref 0.1–0.9)
NEUT#: 4.9 10*3/uL (ref 1.5–6.5)
Platelets: 204 10*3/uL (ref 140–400)
RBC: 5.35 10*6/uL (ref 4.20–5.82)
WBC: 9.3 10*3/uL (ref 4.0–10.3)
lymph#: 3.5 10*3/uL — ABNORMAL HIGH (ref 0.9–3.3)
nRBC: 0 % (ref 0–0)

## 2012-07-30 MED ORDER — SODIUM CHLORIDE 0.9 % IJ SOLN
10.0000 mL | INTRAMUSCULAR | Status: DC | PRN
  Administered 2012-07-30: 10 mL
  Filled 2012-07-30: qty 10

## 2012-07-30 MED ORDER — SODIUM CHLORIDE 0.9 % IV SOLN
1000.0000 mg/m2 | Freq: Once | INTRAVENOUS | Status: AC
  Administered 2012-07-30: 1672 mg via INTRAVENOUS
  Filled 2012-07-30: qty 44

## 2012-07-30 MED ORDER — PROCHLORPERAZINE MALEATE 10 MG PO TABS: 10.0000 mg | ORAL_TABLET | Freq: Four times a day (QID) | ORAL | Status: DC | PRN

## 2012-07-30 MED ORDER — ONDANSETRON HCL 8 MG PO TABS: 8.0000 mg | ORAL_TABLET | Freq: Two times a day (BID) | ORAL | Status: DC | PRN

## 2012-07-30 MED ORDER — HEPARIN SOD (PORK) LOCK FLUSH 100 UNIT/ML IV SOLN
500.0000 [IU] | Freq: Once | INTRAVENOUS | Status: AC | PRN
  Administered 2012-07-30: 500 [IU]
  Filled 2012-07-30: qty 5

## 2012-07-30 MED ORDER — SODIUM CHLORIDE 0.9 % IV SOLN
Freq: Once | INTRAVENOUS | Status: AC
  Administered 2012-07-30: 14:00:00 via INTRAVENOUS

## 2012-07-30 MED ORDER — ONDANSETRON 8 MG/50ML IVPB (CHCC)
8.0000 mg | Freq: Once | INTRAVENOUS | Status: AC
Start: 2012-07-30 — End: 2012-07-30
  Administered 2012-07-30: 8 mg via INTRAVENOUS

## 2012-07-30 MED ORDER — LORAZEPAM 0.5 MG PO TABS: 0.5000 mg | ORAL_TABLET | Freq: Four times a day (QID) | ORAL | Status: DC | PRN

## 2012-07-30 MED ORDER — PROCHLORPERAZINE 25 MG RE SUPP: 25.0000 mg | Freq: Two times a day (BID) | RECTAL | Status: DC | PRN

## 2012-07-30 NOTE — Telephone Encounter (Signed)
Sent michelle a staff message to revise the pt's chemo appt

## 2012-07-30 NOTE — Patient Instructions (Addendum)
[facility] Discharge Instructions for Patients Receiving Chemotherapy  Today you received the following chemotherapy agents: Gemzar  To help prevent nausea and vomiting after your treatment, we encourage you to take your nausea medication as directed by your MD. If you develop nausea and vomiting that is not controlled by your nausea medication, call the clinic. If it is after clinic hours your family physician or the after hours number for the clinic or go to the Emergency Department.   BELOW ARE SYMPTOMS THAT SHOULD BE REPORTED IMMEDIATELY:  *FEVER GREATER THAN 100.5 F  *CHILLS WITH OR WITHOUT FEVER  NAUSEA AND VOMITING THAT IS NOT CONTROLLED WITH YOUR NAUSEA MEDICATION  *UNUSUAL SHORTNESS OF BREATH  *UNUSUAL BRUISING OR BLEEDING  TENDERNESS IN MOUTH AND THROAT WITH OR WITHOUT PRESENCE OF ULCERS  *URINARY PROBLEMS  *BOWEL PROBLEMS  UNUSUAL RASH Items with * indicate a potential emergency and should be followed up as soon as possible.  One of the nurses will contact you 24 hours after your treatment. Please let the nurse know about any problems that you may have experienced. Feel free to call the clinic you have any questions or concerns. The clinic phone number is [phone].

## 2012-07-30 NOTE — Telephone Encounter (Signed)
Per staff message and POF I have scheduled appts.  JMW  

## 2012-07-30 NOTE — Telephone Encounter (Signed)
Per MD, faxed Ativan prescription to patient's pharmacy. Attempted to notify patient of available prescription, no answer and no voicemail.

## 2012-07-30 NOTE — Telephone Encounter (Signed)
gve the pt his appt calendar for j=dec and jan 2014

## 2012-07-30 NOTE — Progress Notes (Signed)
OFFICE PROGRESS NOTE  CC  Colin Rodriguez, MD [address] 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 He underwent pancreatic code duodenectomy. His final pathology revealed a well to moderately differentiated adenocarcinoma the tumor arose from high grade dysplasia involving small bowel mucosa in the ampulla invaded into the pancreas margins. The margins were negative LV I was noted one lymph node was positive for metastatic disease additional 14 lymph nodes were negative. Portal lymph node resection revealed 5 benign lymph nodes. Pathologically T3 N1.  #2 patient is now beginning adjuvant chemotherapy consisting of Gemzar x3 weeks.  #3 he was seen by Dr. Tarri Abernethy for adjuvant radiation therapy with radiosensitizing Xeloda.  CURRENT THERAPY: Patient will proceed with week #1 of Gemzar.  INTERVAL HISTORY: Colin Castro 66 y.o. male returns for followup visit he is accompanied by his wife as well as interpreter. Patient only speaks Gujrati but I am able to communicate with him directly by speaking hindi with him. He had his Port-A-Cath placed. He also has had his chemotherapy teaching class. He had postop staging scans there is a questionable lesion in his liver concerning for possibly metastasis. We will continue to follow this. He has been eating well he denies any nausea vomiting fevers chills night sweats headaches no shortness of breath no chest pains palpitations. Remainder of the 10 point review of systems is negative.  MEDICAL HISTORY: Past Medical History  Diagnosis Date  . Hypertension   . High cholesterol   . Coronary artery disease   . Common bile duct (CBD) stricture     Malignant  . Cancer 06/06/12 bx    stage III  ampullary invasive well to mod diff adenocarcinoma,invades iinto pancreas  . Pneumonia   . Constipation   . Myocardial infarction     15 years ago    ALLERGIES:   has no known allergies.  MEDICATIONS:  Current Outpatient Prescriptions  Medication Sig Dispense Refill  . acetaminophen (TYLENOL) 500 MG tablet Take 500 mg by mouth every 6 (six) hours as needed. For pain      . hydrOXYzine (ATARAX/VISTARIL) 10 MG tablet Take 10 mg by mouth 2 (two) times daily as needed. For itching      . lipase/protease/amylase (CREON-10/PANCREASE) 12000 UNITS CPEP Take 2 capsules by mouth 3 (three) times daily before meals.  270 capsule  2  . megestrol (MEGACE) 400 MG/10ML suspension Take 400 mg by mouth 2 (two) times daily.       . Multiple Vitamin (MULTIVITAMIN WITH MINERALS) TABS Take 1 tablet by mouth daily.      . ondansetron (ZOFRAN) 4 MG tablet Take 4 mg by mouth every 6 (six) hours as needed. For nausea      . oxyCODONE-acetaminophen (PERCOCET/ROXICET) 5-325 MG per tablet Take 1-2 tablets by mouth every 4 (four) hours as needed. For pain  30 tablet  0  . pantoprazole (PROTONIX) 40 MG tablet Take 40 mg by mouth daily.      [name] LORazepam (ATIVAN) 0.5 MG tablet Take 1 tablet (0.5 mg total) by mouth every 6 (six) hours as needed (Nausea or vomiting).  30 tablet  0  . ondansetron (ZOFRAN) 8 MG tablet Take 1 tablet (8 mg total) by mouth 2 (two) times daily as needed (Nausea or vomiting).  30 tablet  1  . prochlorperazine (COMPAZINE) 10 MG tablet Take 1 tablet (10 mg total) by mouth every 6 (six) hours as needed (Nausea or vomiting).  30 tablet  1  . prochlorperazine (COMPAZINE) 25 MG suppository Place 1 suppository (25 mg total) rectally every 12 (twelve) hours as needed for nausea.  12 suppository  3  . QUEtiapine (SEROQUEL) 50 MG tablet Take 50 mg by mouth at bedtime as needed. For sleep.        SURGICAL HISTORY:  Past Surgical History  Procedure Date  . Ercp 05/07/2012    Procedure: ENDOSCOPIC  RETROGRADE CHOLANGIOPANCREATOGRAPHY (ERCP);  Surgeon: [name], MD;  Location: [facility];  Service: Endoscopy;  Laterality: N/A;  Dr [name]  . Eus 05/07/2012    Procedure: UPPER ENDOSCOPIC ULTRASOUND (EUS) RADIAL;  Surgeon: [name], MD;  Location: [facility];  Service: Endoscopy;;  . Appendectomy     open, 25 yrs ago  . Coronary stent placement     2 stents placed  . Whipple procedure 06/06/2012    Procedure: WHIPPLE PROCEDURE;  Surgeon: [name], MD;  Location: [facility] OR;  Service: General;  Laterality: N/A;  Pancreatico duodenectomy  . Cholecystectomy 06/06/2012    Procedure: CHOLECYSTECTOMY;  Surgeon: [name], MD;  Location: [facility] OR;  Service: General;  Laterality: N/A;  . Pancreatic stent placement 06/06/2012    Procedure: PANCREATIC STENT PLACEMENT;  Surgeon: [name], MD;  Location: [facility] OR;  Service: General;  Laterality: N/A;  Removal of biliary stent; Placement of pacreatic stent  . Lymph node dissection 06/06/2012    Procedure: LYMPH NODE DISSECTION;  Surgeon: [name], MD;  Location: [facility] OR;  Service: General;  Laterality: N/A;  Portal lymph node dissection  . Coronary angioplasty     8 or 9 years ago  . Portacath placement 07/24/2012    Procedure: INSERTION PORT-A-CATH;  Surgeon: [name], MD;  Location: [facility] OR;  Service: General;  Laterality: Left;    REVIEW OF SYSTEMS:  Pertinent items are noted in HPI.   HEALTH MAINTENANCE:  PHYSICAL EXAMINATION: Blood pressure 112/80, pulse 103, temperature 97 F (36.1 C), resp. rate 20, height 5\' 6"  (1.676 m), weight 132 lb 4.8 oz (60.011 kg). Body mass index is 21.35 kg/(m^2). ECOG PERFORMANCE STATUS: 1 - Symptomatic but completely ambulatory Thin elderly gentleman in no acute distress he looks much better in comparison to his prior visit. HEENT exam EOMI poor oral dental hygiene neck is supple no palpable adenopathy lungs are clear to auscultation cardiovascular regular rate rhythm no murmurs abdomen is  soft nontender nondistended bowel sounds are present no HSM extremities no edema neuro patient's alert oriented otherwise nonfocal     LABORATORY DATA: Lab Results  Component Value Date   WBC 9.3 07/30/2012   HGB 14.6 07/30/2012   HCT 43.0 07/30/2012   MCV 80.4 07/30/2012   PLT 204 07/30/2012      Chemistry      Component Value Date/Time   NA 138 07/24/2012 0826   NA 136 07/11/2012 1534   K 4.5 07/24/2012 0826   K 4.0 07/11/2012 1534   CL 104 07/24/2012 0826   CL 108* 07/11/2012 1534   CO2 23 07/24/2012 0826   CO2 23 07/11/2012 1534   BUN 13 07/24/2012 0826   BUN 18.0 07/11/2012 1534   CREATININE 0.91 07/24/2012 0826   CREATININE 1.3 07/11/2012 1534      Component Value Date/Time   CALCIUM 9.6 07/24/2012 0826   CALCIUM 9.6 07/11/2012 1534  ALKPHOS 152* 07/11/2012 1534   ALKPHOS 124* 06/25/2012 0555   AST 50* 07/11/2012 1534   AST 30 06/25/2012 0555   ALT 157* 07/11/2012 1534   ALT 56* 06/25/2012 0555   BILITOT 0.51 07/11/2012 1534   BILITOT 0.7 06/25/2012 0555       RADIOGRAPHIC STUDIES:  Ct Chest W Contrast  07/25/2012  *RADIOLOGY REPORT*  Clinical Data:  Staging pancreatic cancer.  CT CHEST, ABDOMEN AND PELVIS WITH CONTRAST  Technique:  Multidetector CT imaging of the chest, abdomen and pelvis was performed following the standard protocol during bolus administration of intravenous contrast.  Contrast: OMNIPAQUE IOHEXOL 300 MG/ML  SOLN  Comparison:  CT 06/15/2012.  CT CHEST  Findings:  A recently placed left-sided Port-A-Cath is noted.  No complicating features.  No supraclavicular or axillary lymphadenopathy.  Small scattered nodes are noted.  The thyroid gland is normal.  The bony thorax is intact.  No destructive bone lesions or spinal canal compromise.  The heart is normal in size.  No pericardial effusion.  No mediastinal or hilar lymphadenopathy.  The esophagus is grossly normal.  Moderate atherosclerotic calcifications involving the aorta but no focal aneurysm or  dissection.  Dense coronary artery calcifications are noted.  Examination of the lung parenchyma demonstrate emphysematous changes and probable chronic bronchitic changes.  There is bibasilar atelectasis and mild lower lobe vascular crowding.  No definite nodules or masses.  IMPRESSION: No CT findings for metastatic disease involving the chest. Emphysematous changes and right basilar atelectasis but no worrisome pulmonary nodules.  CT ABDOMEN AND PELVIS  Findings:  There is marked fatty infiltration of the liver with areas of focal fatty sparing. There is a vague nodular area of enhancement in the right hepatic lobe on the arterial phase images along with some surrounding vascular enhancement.  On the portal venous phase imaging this is a low attenuation area with slight surrounding enhancement.  Findings suspicious for a hepatic metastasis.  Close follow-up is suggested.  There are surgical changes from a Whipple procedure.  The pancreas is normal and pancreatic duct is within normal limits.  The spleen is normal in size.  No focal lesions.  The adrenal glands and kidneys are normal and stable.  The stomach demonstrates surgical changes.  No mass or surrounding adenopathy.  The duodenum and small bowel are unremarkable.  No colonic abnormalities.  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  The major branch vessels are patent.  No mesenteric or retroperitoneal mass or adenopathy.  IMPRESSION:  1.  Expected surgical changes from a Whipple procedure.  No complicating features are demonstrated.  No worrisome mass lesion. Vague soft tissue density near the superior mesenteric vein is likely postoperative change but attention to this area on future scans is suggested. 2.  Findings suspicious for a hepatic metastatic lesion.  Close follow-up is recommended. 3.  Stable advanced atherosclerotic changes involving the aorta and stable mild aneurysmal  dilatation.   Original Report Authenticated By: Rudie Meyer, M.D.    Ct Abdomen Pelvis W Contrast  07/25/2012  *RADIOLOGY REPORT*  Clinical Data:  Staging pancreatic cancer.  CT CHEST, ABDOMEN AND PELVIS WITH CONTRAST  Technique:  Multidetector CT imaging of the chest, abdomen and pelvis was performed following the standard protocol during bolus administration of intravenous contrast.  Contrast: OMNIPAQUE IOHEXOL 300 MG/ML  SOLN  Comparison:  CT 06/15/2012.  CT CHEST  Findings:  A recently placed left-sided Port-A-Cath is noted.  No  complicating features.  No supraclavicular or axillary lymphadenopathy.  Small scattered nodes are noted.  The thyroid gland is normal.  The bony thorax is intact.  No destructive bone lesions or spinal canal compromise.  The heart is normal in size.  No pericardial effusion.  No mediastinal or hilar lymphadenopathy.  The esophagus is grossly normal.  Moderate atherosclerotic calcifications involving the aorta but no focal aneurysm or dissection.  Dense coronary artery calcifications are noted.  Examination of the lung parenchyma demonstrate emphysematous changes and probable chronic bronchitic changes.  There is bibasilar atelectasis and mild lower lobe vascular crowding.  No definite nodules or masses.  IMPRESSION: No CT findings for metastatic disease involving the chest. Emphysematous changes and right basilar atelectasis but no worrisome pulmonary nodules.  CT ABDOMEN AND PELVIS  Findings:  There is marked fatty infiltration of the liver with areas of focal fatty sparing. There is a vague nodular area of enhancement in the right hepatic lobe on the arterial phase images along with some surrounding vascular enhancement.  On the portal venous phase imaging this is a low attenuation area with slight surrounding enhancement.  Findings suspicious for a hepatic metastasis.  Close follow-up is suggested.  There are surgical changes from a Whipple procedure.  The pancreas is  normal and pancreatic duct is within normal limits.  The spleen is normal in size.  No focal lesions.  The adrenal glands and kidneys are normal and stable.  The stomach demonstrates surgical changes.  No mass or surrounding adenopathy.  The duodenum and small bowel are unremarkable.  No colonic abnormalities.  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  The major branch vessels are patent.  No mesenteric or retroperitoneal mass or adenopathy.  IMPRESSION:  1.  Expected surgical changes from a Whipple procedure.  No complicating features are demonstrated.  No worrisome mass lesion. Vague soft tissue density near the superior mesenteric vein is likely postoperative change but attention to this area on future scans is suggested. 2.  Findings suspicious for a hepatic metastatic lesion.  Close follow-up is recommended. 3.  Stable advanced atherosclerotic changes involving the aorta and stable mild aneurysmal dilatation.   Original Report Authenticated By: Rudie Meyer, M.D.    Nm Pet Image Initial (pi) Skull Base To Thigh  07/25/2012  *RADIOLOGY REPORT*  Clinical Data: Initial treatment strategy for pancreatic cancer.  NUCLEAR MEDICINE PET SKULL BASE TO THIGH  Fasting Blood Glucose:  90  Technique:  17.9 mCi F-18 FDG was injected intravenously. CT data was obtained and used for attenuation correction and anatomic localization only.  (This was not acquired as a diagnostic CT examination.) Additional exam technical data entered on technologist worksheet.  Comparison:  Multiple prior CT scans.  Findings:  Neck: No hypermetabolic lymph nodes in the neck.  Chest:  No hypermetabolic mediastinal or hilar nodes.  No suspicious pulmonary nodules on the CT scan.  Abdomen/Pelvis:  No abnormal hypermetabolic activity within the liver, pancreas, adrenal glands, or spleen.  No hypermetabolic lymph nodes in the abdomen or pelvis. Expected postoperative changes.  The  abnormality in the right hepatic lobe on the CT scan does not show any definite abnormal FDG uptake.  Also, the small soft tissue density near the superior mesenteric vein does not show any FDG uptake.  No FDG positive mesenteric or retroperitoneal lymphadenopathy.  Skeleton:  No focal hypermetabolic activity to suggest skeletal metastasis.  IMPRESSION: No findings to suggest residual/recurrent neoplasm or metastatic disease.  The abnormality on the CT scan in the liver does need to be followed closely however.   Original Report Authenticated By: Rudie Meyer, M.D.    Dg Chest Port 1 View  07/24/2012  *RADIOLOGY REPORT*  Clinical Data: Port-A-Cath placement  PORTABLE CHEST - 1 VIEW  Comparison: July 03, 2012 and chest CT June 15, 2012  Findings:  Port-A-Cath tip is in the right atrium region.  No pneumothorax.  There is patchy consolidation in the right lower lobe region. There is mild scarring in the left base.  The lungs are otherwise clear.  The heart size and pulmonary vascularity are normal.  No adenopathy.  No bone lesions.  IMPRESSION:   Central catheter tip and right atrium region.  No pneumothorax.  Persistent air space consolidation right lower lobe. Given the persistence of this finding, correlation with bronchoscopy may well be warranted.   Original Report Authenticated By: Bretta Bang, M.D.    Dg Abd Acute W/chest  07/03/2012  *RADIOLOGY REPORT*  Clinical Data: Recent Whipple procedure.  Hiccups.  ACUTE ABDOMEN SERIES (ABDOMEN 2 VIEW & CHEST 1 VIEW)  Comparison: 05/27/2012; 06/15/2012  Findings: Persistent airspace opacity noted at the right lung base. Linear plate-like opacity in the left lower lobe and potentially mild retrocardiac airspace opacity on the left.  Faint interstitial accentuation in the lung bases, possibly due to the low lung volumes.  No cardiomegaly.  Mild tortuosity of the thoracic aorta. Prior right-sided central lines have been removed.  No pneumothorax.  No free  peritoneal gas is observed beneath the hemidiaphragms. Clips project over the right upper quadrant.  On the upright view, there are several scattered air-fluid levels in nondilated small bowel.  A midline drainage catheter is noted.  No centralization of bowel loops to suggest ascites.  IMPRESSION:  1.  Postoperative findings in the upper abdomen likely reflecting the patients reported recent Whipple procedure.  No free air.  The right-sided central lines have been removed.  A midline drainage catheter is present projecting over the mid abdomen. 2.  There are scattered air-fluid levels in nondilated small bowel, abnormal but nonspecific, potentially reflecting slight ileus or mild enteritis.  Gas and formed stool present in the colon.  No dilated bowel to suggest obstruction. 3.  Persistent airspace opacity in the right lower lobe, with a lesser degree of airspace opacity in the left lower lobe. Pneumonia is not readily excluded given the radiographic appearance.   Original Report Authenticated By: Gaylyn Rong, M.D.    Dg Fluoro Guide Cv Line-no Report  07/24/2012  CLINICAL DATA: port placement   FLOURO GUIDE CV LINE  Fluoroscopy was utilized by the requesting physician.  No radiographic  interpretation.      ASSESSMENT: 66 year old gentleman with  #1 periampullary carcinoma stage III patient is status post Whipple procedure overall she's done well with it. You will not receive adjuvant chemotherapy initially consisting of Gemzar for one cycle. He will then go on to receive concomitant chemoradiation with chemotherapy consisting of xeloda. Risks and benefits of treatment have been discussed with them completely.   PLAN:   #1 proceed with scheduled chemotherapy.  #2 he will return in one week's time for week #2.  #3They know to call with any problem   All questions were answered. The patient knows to call the clinic with any problems, questions or concerns. We can certainly see the patient  much sooner if necessary.  I spent 25 minutes counseling the patient face to face. The total time  spent in the appointment was 30 minutes.    Drue Second, MD Medical/Oncology [facility] [phone] (beeper) [phone] (Office)  07/30/2012, 12:16 PM

## 2012-07-31 ENCOUNTER — Telehealth: Payer: Self-pay | Admitting: Medical Oncology

## 2012-07-31 ENCOUNTER — Telehealth: Payer: Self-pay | Admitting: *Deleted

## 2012-07-31 NOTE — Telephone Encounter (Signed)
Message copied by Augusto Garbe on Thu Jul 31, 2012  1:18 PM ------      Message from: Faith Rogue F      Created: Wed Jul 30, 2012  2:06 PM      Regarding: chemo f/u call       Gemzar      Dr. Welton Flakes      Limited English- Pt lives at hotel, so call home number listed, then ask for RM #[phone].

## 2012-07-31 NOTE — Telephone Encounter (Signed)
Call to inform patient that the Ativan prescription is available at his pharmacy. Patient verbalized understanding. No questions at this time.

## 2012-07-31 NOTE — Telephone Encounter (Signed)
Error

## 2012-07-31 NOTE — Telephone Encounter (Signed)
Spoke with patient and his daughter.  He is feeling good.  Denies any side effects.  Reports his bowels haven't moved in two days and what he is taking isn't working.  Has taken the [facility] generic brand of colace.  Instructed to increase water intake or hot herbal teas to 64 oz daily.  Also to take senokot-S on a regular basis due to his use of pain meds.  To get bowels moving today suggested miralax or magnesium citrate.  Daughter able to explain to me how these agents work and will get something to help his bowels move.  No further questions.

## 2012-08-01 ENCOUNTER — Telehealth: Payer: Self-pay | Admitting: Medical Oncology

## 2012-08-01 NOTE — Telephone Encounter (Signed)
Patient's daughter call [facility] regarding the "three different medications for nausea and vomitting prescribed for him, he's confused about them." Attempted to return patient's daughters call and was unable to reach daughter with numbers provided [phone], [phone]).

## 2012-08-04 ENCOUNTER — Telehealth: Payer: Self-pay | Admitting: *Deleted

## 2012-08-04 NOTE — Telephone Encounter (Signed)
Returned pt's daughter call back. Discussed pt's n/v medications. Pt's daughter verbalized understanding. No further questions

## 2012-08-06 ENCOUNTER — Ambulatory Visit (HOSPITAL_BASED_OUTPATIENT_CLINIC_OR_DEPARTMENT_OTHER): Payer: Medicaid Other

## 2012-08-06 ENCOUNTER — Encounter: Payer: Self-pay | Admitting: Oncology

## 2012-08-06 ENCOUNTER — Ambulatory Visit (HOSPITAL_BASED_OUTPATIENT_CLINIC_OR_DEPARTMENT_OTHER): Payer: Medicare Other | Admitting: Oncology

## 2012-08-06 ENCOUNTER — Telehealth: Payer: Self-pay | Admitting: *Deleted

## 2012-08-06 ENCOUNTER — Other Ambulatory Visit (HOSPITAL_BASED_OUTPATIENT_CLINIC_OR_DEPARTMENT_OTHER): Payer: Medicare Other | Admitting: Lab

## 2012-08-06 VITALS — BP 110/75 | HR 86 | Temp 97.8°F | Resp 20 | Ht 66.0 in | Wt 132.3 lb

## 2012-08-06 DIAGNOSIS — K5900 Constipation, unspecified: Secondary | ICD-10-CM

## 2012-08-06 DIAGNOSIS — C241 Malignant neoplasm of ampulla of Vater: Secondary | ICD-10-CM

## 2012-08-06 DIAGNOSIS — E860 Dehydration: Secondary | ICD-10-CM

## 2012-08-06 DIAGNOSIS — Z5111 Encounter for antineoplastic chemotherapy: Secondary | ICD-10-CM

## 2012-08-06 DIAGNOSIS — C801 Malignant (primary) neoplasm, unspecified: Secondary | ICD-10-CM

## 2012-08-06 LAB — CBC WITH DIFFERENTIAL/PLATELET
Basophils Absolute: 0 10*3/uL (ref 0.0–0.1)
EOS%: 0.3 % (ref 0.0–7.0)
Eosinophils Absolute: 0 10*3/uL (ref 0.0–0.5)
HCT: 39.4 % (ref 38.4–49.9)
HGB: 13.3 g/dL (ref 13.0–17.1)
MCH: 26.9 pg — ABNORMAL LOW (ref 27.2–33.4)
MONO#: 0.7 10*3/uL (ref 0.1–0.9)
NEUT#: 2.4 10*3/uL (ref 1.5–6.5)
NEUT%: 36.5 % — ABNORMAL LOW (ref 39.0–75.0)
RDW: 15.4 % — ABNORMAL HIGH (ref 11.0–14.6)
lymph#: 3.4 10*3/uL — ABNORMAL HIGH (ref 0.9–3.3)

## 2012-08-06 LAB — COMPREHENSIVE METABOLIC PANEL (CC13)
Albumin: 3.1 g/dL — ABNORMAL LOW (ref 3.5–5.0)
BUN: 18 mg/dL (ref 7.0–26.0)
CO2: 21 mEq/L — ABNORMAL LOW (ref 22–29)
Calcium: 9.4 mg/dL (ref 8.4–10.4)
Chloride: 106 mEq/L (ref 98–107)
Creatinine: 0.8 mg/dL (ref 0.7–1.3)
Glucose: 91 mg/dl (ref 70–99)
Potassium: 3.9 mEq/L (ref 3.5–5.1)

## 2012-08-06 MED ORDER — SODIUM CHLORIDE 0.9 % IJ SOLN
10.0000 mL | INTRAMUSCULAR | Status: DC | PRN
  Administered 2012-08-06: 10 mL
  Filled 2012-08-06: qty 10

## 2012-08-06 MED ORDER — SODIUM CHLORIDE 0.9 % IV SOLN
1000.0000 mg/m2 | Freq: Once | INTRAVENOUS | Status: AC
  Administered 2012-08-06: 1672 mg via INTRAVENOUS
  Filled 2012-08-06: qty 44

## 2012-08-06 MED ORDER — POLYETHYLENE GLYCOL 3350 17 GM/SCOOP PO POWD: 17.0000 g | Freq: Every day | ORAL | Status: AC

## 2012-08-06 MED ORDER — SENNOSIDES-DOCUSATE SODIUM 8.6-50 MG PO TABS: 1.0000 | ORAL_TABLET | Freq: Every day | ORAL | Status: DC

## 2012-08-06 MED ORDER — HEPARIN SOD (PORK) LOCK FLUSH 100 UNIT/ML IV SOLN
500.0000 [IU] | Freq: Once | INTRAVENOUS | Status: AC | PRN
  Administered 2012-08-06: 500 [IU]
  Filled 2012-08-06: qty 5

## 2012-08-06 MED ORDER — SODIUM CHLORIDE 0.9 % IV SOLN: Freq: Once | INTRAVENOUS | Status: DC

## 2012-08-06 MED ORDER — PROCHLORPERAZINE MALEATE 10 MG PO TABS
10.0000 mg | ORAL_TABLET | Freq: Once | ORAL | Status: AC
  Administered 2012-08-06: 10 mg via ORAL

## 2012-08-06 MED ORDER — SODIUM CHLORIDE 0.9 % IV SOLN
Freq: Once | INTRAVENOUS | Status: AC
  Administered 2012-08-06: 16:00:00 via INTRAVENOUS

## 2012-08-06 NOTE — Telephone Encounter (Signed)
Add on np appointment for 08-14-2012   [facility] email to add on fluids for 08-06-2012 08-08-2012

## 2012-08-06 NOTE — Patient Instructions (Addendum)
[facility] Discharge Instructions for Patients Receiving Chemotherapy  Today you received the following chemotherapy agents Gemzar  To help prevent nausea and vomiting after your treatment, we encourage you to take your nausea medication as prescribed.   If you develop nausea and vomiting that is not controlled by your nausea medication, call the clinic. If it is after clinic hours your family physician or the after hours number for the clinic or go to the Emergency Department.   BELOW ARE SYMPTOMS THAT SHOULD BE REPORTED IMMEDIATELY:  *FEVER GREATER THAN 100.5 F  *CHILLS WITH OR WITHOUT FEVER  NAUSEA AND VOMITING THAT IS NOT CONTROLLED WITH YOUR NAUSEA MEDICATION  *UNUSUAL SHORTNESS OF BREATH  *UNUSUAL BRUISING OR BLEEDING  TENDERNESS IN MOUTH AND THROAT WITH OR WITHOUT PRESENCE OF ULCERS  *URINARY PROBLEMS  *BOWEL PROBLEMS  UNUSUAL RASH Items with * indicate a potential emergency and should be followed up as soon as possible.    Feel free to call the clinic you have any questions or concerns. The clinic phone number is [phone].   I have been informed and understand all the instructions given to me. I know to contact the clinic, my physician, or go to the Emergency Department if any problems should occur. I do not have any questions at this time, but understand that I may call the clinic during office hours   should I have any questions or need assistance in obtaining follow up care.    __________________________________________  _____________  __________ Signature of Patient or Authorized Representative            Date                   Time    __________________________________________ Nurse's Signature   Dehydration, Adult Dehydration is when you lose more fluids from the body than you take in. Vital organs like the kidneys, brain, and heart cannot function without a proper amount of fluids and salt. Any loss of fluids from the body can cause  dehydration.  CAUSES   Vomiting.  Diarrhea.  Excessive sweating.  Excessive urine output.  Fever. SYMPTOMS  Mild dehydration  Thirst.  Dry lips.  Slightly dry mouth. Moderate dehydration  Very dry mouth.  Sunken eyes.  Skin does not bounce back quickly when lightly pinched and released.  Dark urine and decreased urine production.  Decreased tear production.  Headache. Severe dehydration  Very dry mouth.  Extreme thirst.  Rapid, weak pulse (more than 100 beats per minute at rest).  Cold hands and feet.  Not able to sweat in spite of heat and temperature.  Rapid breathing.  Blue lips.  Confusion and lethargy.  Difficulty being awakened.  Minimal urine production.  No tears. DIAGNOSIS  Your caregiver will diagnose dehydration based on your symptoms and your exam. Blood and urine tests will help confirm the diagnosis. The diagnostic evaluation should also identify the cause of dehydration. TREATMENT  Treatment of mild or moderate dehydration can often be done at home by increasing the amount of fluids that you drink. It is best to drink small amounts of fluid more often. Drinking too much at one time can make vomiting worse. Refer to the home care instructions below. Severe dehydration needs to be treated at the hospital where you will probably be given intravenous (IV) fluids that contain water and electrolytes. HOME CARE INSTRUCTIONS   Ask your caregiver about specific rehydration instructions.  Drink enough fluids to keep your urine clear or  pale yellow.  Drink small amounts frequently if you have nausea and vomiting.  Eat as you normally do.  Avoid:  Foods or drinks high in sugar.  Carbonated drinks.  Juice.  Extremely hot or cold fluids.  Drinks with caffeine.  Fatty, greasy foods.  Alcohol.  Tobacco.  Overeating.  Gelatin desserts.  Wash your hands well to avoid spreading bacteria and viruses.  Only take over-the-counter  or prescription medicines for pain, discomfort, or fever as directed by your caregiver.  Ask your caregiver if you should continue all prescribed and over-the-counter medicines.  Keep all follow-up appointments with your caregiver. SEEK MEDICAL CARE IF:  You have abdominal pain and it increases or stays in one area (localizes).  You have a rash, stiff neck, or severe headache.  You are irritable, sleepy, or difficult to awaken.  You are weak, dizzy, or extremely thirsty. SEEK IMMEDIATE MEDICAL CARE IF:   You are unable to keep fluids down or you get worse despite treatment.  You have frequent episodes of vomiting or diarrhea.  You have blood or green matter (bile) in your vomit.  You have blood in your stool or your stool looks black and tarry.  You have not urinated in 6 to 8 hours, or you have only urinated a small amount of very dark urine.  You have a fever.  You faint. MAKE SURE YOU:   Understand these instructions.  Will watch your condition.  Will get help right away if you are not doing well or get worse. Document Released: 08/06/2005 Document Revised: 10/29/2011 Document Reviewed: 03/26/2011 [facility] Patient Information 2013 [place], [place].

## 2012-08-06 NOTE — Patient Instructions (Addendum)
Proceed with chemotherapy today and IVF today  Constipation:  Take 1 senna daily for 3 days if no effect then take 1 senna in the morning and 1 senna at night until you have a bowel movement.   If you develop diarrhea then stop.   We will see you back in 1 week for your next chemotherapy. We will also give you IVF next week   Please drink lots of water (eight glasses of water a day ) to prevent weakness and dehydration

## 2012-08-06 NOTE — Progress Notes (Signed)
OFFICE PROGRESS NOTE  CC  [name], MD [address] 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 He underwent pancreatic code duodenectomy. His final pathology revealed a well to moderately differentiated adenocarcinoma the tumor arose from high grade dysplasia involving small bowel mucosa in the ampulla invaded into the pancreas margins. The margins were negative LV I was noted one lymph node was positive for metastatic disease additional 14 lymph nodes were negative. Portal lymph node resection revealed 5 benign lymph nodes. Pathologically T3 N1.  #2 patient is now beginning adjuvant chemotherapy consisting of Gemzar x3 weeks.  #3 he was seen by Dr. Tarri Abernethy for adjuvant radiation therapy with radiosensitizing Xeloda.  CURRENT THERAPY: Patient will proceed with week #2 cycle 1 of Gemzar.  INTERVAL HISTORY: Colin Castro 66 y.o. male returns for followup visit he is accompanied by his wife as well as interpreter. Patient only speaks Gujrati but I am able to communicate with him directly by speaking hindi with him. Patient is experiencing constipation. I have recommended that he take senna for this. He otherwise did not have any nausea or vomiting. He is weak and tired today. He has not been drinking as much. He looks dehydrated to me. He does not have any fevers chills or night sweats. Remainder of the 10 point review of systems is negative. MEDICAL HISTORY: Past Medical History  Diagnosis Date  . Hypertension   . High cholesterol   . Coronary artery disease   . Common bile duct (CBD) stricture     Malignant  . 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  Pneumonia   . Constipation   . Myocardial infarction     15 years ago    ALLERGIES:   has no known allergies.  MEDICATIONS:  Current Outpatient Prescriptions  Medication Sig Dispense Refill  . acetaminophen (TYLENOL) 500 MG tablet Take 500 mg by mouth every 6 (six) hours as needed. For pain      . hydrOXYzine (ATARAX/VISTARIL) 10 MG tablet Take 10 mg by mouth 2 (two) times daily as needed. For itching      . lipase/protease/amylase (CREON-10/PANCREASE) 12000 UNITS CPEP Take 2 capsules by mouth 3 (three) times daily before meals.  270 capsule  2  . LORazepam (ATIVAN) 0.5 MG tablet Take 1 tablet (0.5 mg total) by mouth every 6 (six) hours as needed (Nausea or vomiting).  30 tablet  0  . megestrol (MEGACE) 400 MG/10ML suspension Take 400 mg by mouth 2 (two) times daily.       . Multiple Vitamin (MULTIVITAMIN WITH MINERALS) TABS Take 1 tablet by mouth daily.      . ondansetron (ZOFRAN) 4 MG tablet Take 4 mg by mouth every 6 (six) hours as needed. For nausea      . ondansetron (ZOFRAN) 8 MG tablet Take 1 tablet (8 mg total) by mouth 2 (two) times daily as needed (Nausea or vomiting).  30 tablet  1  . oxyCODONE-acetaminophen (PERCOCET/ROXICET) 5-325 MG per tablet Take 1-2 tablets by mouth every 4 (four) hours as needed. For pain  30 tablet  0  . pantoprazole (PROTONIX) 40 MG tablet Take 40 mg by mouth daily.      . prochlorperazine (COMPAZINE) 10 MG tablet  Take 1 tablet (10 mg total) by mouth every 6 (six) hours as needed (Nausea or vomiting).  30 tablet  1  . prochlorperazine (COMPAZINE) 25 MG suppository Place 1 suppository (25 mg total) rectally every 12 (twelve) hours as needed for nausea.  12 suppository  3  . QUEtiapine (SEROQUEL) 50 MG tablet Take 50 mg by mouth at bedtime as needed. For sleep.      . polyethylene glycol powder (MIRALAX) powder Take 17 g by mouth daily.  255 g  4  . senna-docusate (SENNA S) 8.6-50 MG per tablet Take 1 tablet by mouth daily.  30 tablet  4   No current  facility-administered medications for this visit.   Facility-Administered Medications Ordered in Other Visits  Medication Dose Route Frequency Provider Last Rate Last Dose  . 0.9 %  sodium chloride infusion   Intravenous Once Victorino December, MD        SURGICAL HISTORY:  Past Surgical History  Procedure Date  . Ercp 05/07/2012    Procedure: ENDOSCOPIC RETROGRADE CHOLANGIOPANCREATOGRAPHY (ERCP);  Surgeon: [name], MD;  Location: [facility];  Service: Endoscopy;  Laterality: N/A;  Dr [name]  . Eus 05/07/2012    Procedure: UPPER ENDOSCOPIC ULTRASOUND (EUS) RADIAL;  Surgeon: [name], MD;  Location: [facility];  Service: Endoscopy;;  . Appendectomy     open, 25 yrs ago  . Coronary stent placement     2 stents placed  . Whipple procedure 06/06/2012    Procedure: WHIPPLE PROCEDURE;  Surgeon: [name], MD;  Location: [facility] OR;  Service: General;  Laterality: N/A;  Pancreatico duodenectomy  . Cholecystectomy 06/06/2012    Procedure: CHOLECYSTECTOMY;  Surgeon: [name], MD;  Location: [facility] OR;  Service: General;  Laterality: N/A;  . Pancreatic stent placement 06/06/2012    Procedure: PANCREATIC STENT PLACEMENT;  Surgeon: [name], MD;  Location: [facility] OR;  Service: General;  Laterality: N/A;  Removal of biliary stent; Placement of pacreatic stent  . Lymph node dissection 06/06/2012    Procedure: LYMPH NODE DISSECTION;  Surgeon: [name], MD;  Location: [facility] OR;  Service: General;  Laterality: N/A;  Portal lymph node dissection  . Coronary angioplasty     8 or 9 years ago  . Portacath placement 07/24/2012    Procedure: INSERTION PORT-A-CATH;  Surgeon: [name], MD;  Location: [facility] OR;  Service: General;  Laterality: Left;    REVIEW OF SYSTEMS:  Pertinent items are noted in HPI.   HEALTH MAINTENANCE:  PHYSICAL EXAMINATION: Blood pressure 110/75, pulse 86, temperature 97.8 F (36.6 C), temperature source Oral, resp. rate 20, height 5\' 6"  (1.676 m), weight 132 lb 4.8 oz  (60.011 kg). Body mass index is 21.35 kg/(m^2). ECOG PERFORMANCE STATUS: 1 - Symptomatic but completely ambulatory Thin elderly gentleman in no acute distress he looks much better in comparison to his prior visit. HEENT exam EOMI poor oral dental hygiene neck is supple no palpable adenopathy lungs are clear to auscultation cardiovascular regular rate rhythm no murmurs abdomen is soft nontender nondistended bowel sounds are present no HSM extremities no edema neuro patient's alert oriented otherwise nonfocal     LABORATORY DATA: Lab Results  Component Value Date   WBC 6.5 08/06/2012   HGB 13.3 08/06/2012   HCT 39.4 08/06/2012   MCV 79.8 08/06/2012   PLT 131* 08/06/2012      Chemistry      Component Value Date/Time   NA 138 07/24/2012 0826   NA 136 07/11/2012 1534  K 4.5 07/24/2012 0826   K 4.0 07/11/2012 1534   CL 104 07/24/2012 0826   CL 108* 07/11/2012 1534   CO2 23 07/24/2012 0826   CO2 23 07/11/2012 1534   BUN 13 07/24/2012 0826   BUN 18.0 07/11/2012 1534   CREATININE 0.91 07/24/2012 0826   CREATININE 1.3 07/11/2012 1534      Component Value Date/Time   CALCIUM 9.6 07/24/2012 0826   CALCIUM 9.6 07/11/2012 1534   ALKPHOS 152* 07/11/2012 1534   ALKPHOS 124* 06/25/2012 0555   AST 50* 07/11/2012 1534   AST 30 06/25/2012 0555   ALT 157* 07/11/2012 1534   ALT 56* 06/25/2012 0555   BILITOT 0.51 07/11/2012 1534   BILITOT 0.7 06/25/2012 0555       RADIOGRAPHIC STUDIES:  Ct Chest W Contrast  07/25/2012  *RADIOLOGY REPORT*  Clinical Data:  Staging pancreatic cancer.  CT CHEST, ABDOMEN AND PELVIS WITH CONTRAST  Technique:  Multidetector CT imaging of the chest, abdomen and pelvis was performed following the standard protocol during bolus administration of intravenous contrast.  Contrast: OMNIPAQUE IOHEXOL 300 MG/ML  SOLN  Comparison:  CT 06/15/2012.  CT CHEST  Findings:  A recently placed left-sided Port-A-Cath is noted.  No complicating features.  No supraclavicular or axillary  lymphadenopathy.  Small scattered nodes are noted.  The thyroid gland is normal.  The bony thorax is intact.  No destructive bone lesions or spinal canal compromise.  The heart is normal in size.  No pericardial effusion.  No mediastinal or hilar lymphadenopathy.  The esophagus is grossly normal.  Moderate atherosclerotic calcifications involving the aorta but no focal aneurysm or dissection.  Dense coronary artery calcifications are noted.  Examination of the lung parenchyma demonstrate emphysematous changes and probable chronic bronchitic changes.  There is bibasilar atelectasis and mild lower lobe vascular crowding.  No definite nodules or masses.  IMPRESSION: No CT findings for metastatic disease involving the chest. Emphysematous changes and right basilar atelectasis but no worrisome pulmonary nodules.  CT ABDOMEN AND PELVIS  Findings:  There is marked fatty infiltration of the liver with areas of focal fatty sparing. There is a vague nodular area of enhancement in the right hepatic lobe on the arterial phase images along with some surrounding vascular enhancement.  On the portal venous phase imaging this is a low attenuation area with slight surrounding enhancement.  Findings suspicious for a hepatic metastasis.  Close follow-up is suggested.  There are surgical changes from a Whipple procedure.  The pancreas is normal and pancreatic duct is within normal limits.  The spleen is normal in size.  No focal lesions.  The adrenal glands and kidneys are normal and stable.  The stomach demonstrates surgical changes.  No mass or surrounding adenopathy.  The duodenum and small bowel are unremarkable.  No colonic abnormalities.  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  The major branch vessels are patent.  No mesenteric or retroperitoneal mass or adenopathy.  IMPRESSION:  1.  Expected surgical changes from a Whipple procedure.  No complicating  features are demonstrated.  No worrisome mass lesion. Vague soft tissue density near the superior mesenteric vein is likely postoperative change but attention to this area on future scans is suggested. 2.  Findings suspicious for a hepatic metastatic lesion.  Close follow-up is recommended. 3.  Stable advanced atherosclerotic changes involving the aorta and stable mild aneurysmal dilatation.   Original Report Authenticated By: Rudie Meyer, M.D.  Ct Abdomen Pelvis W Contrast  07/25/2012  *RADIOLOGY REPORT*  Clinical Data:  Staging pancreatic cancer.  CT CHEST, ABDOMEN AND PELVIS WITH CONTRAST  Technique:  Multidetector CT imaging of the chest, abdomen and pelvis was performed following the standard protocol during bolus administration of intravenous contrast.  Contrast: OMNIPAQUE IOHEXOL 300 MG/ML  SOLN  Comparison:  CT 06/15/2012.  CT CHEST  Findings:  A recently placed left-sided Port-A-Cath is noted.  No complicating features.  No supraclavicular or axillary lymphadenopathy.  Small scattered nodes are noted.  The thyroid gland is normal.  The bony thorax is intact.  No destructive bone lesions or spinal canal compromise.  The heart is normal in size.  No pericardial effusion.  No mediastinal or hilar lymphadenopathy.  The esophagus is grossly normal.  Moderate atherosclerotic calcifications involving the aorta but no focal aneurysm or dissection.  Dense coronary artery calcifications are noted.  Examination of the lung parenchyma demonstrate emphysematous changes and probable chronic bronchitic changes.  There is bibasilar atelectasis and mild lower lobe vascular crowding.  No definite nodules or masses.  IMPRESSION: No CT findings for metastatic disease involving the chest. Emphysematous changes and right basilar atelectasis but no worrisome pulmonary nodules.  CT ABDOMEN AND PELVIS  Findings:  There is marked fatty infiltration of the liver with areas of focal fatty sparing. There is a vague nodular  area of enhancement in the right hepatic lobe on the arterial phase images along with some surrounding vascular enhancement.  On the portal venous phase imaging this is a low attenuation area with slight surrounding enhancement.  Findings suspicious for a hepatic metastasis.  Close follow-up is suggested.  There are surgical changes from a Whipple procedure.  The pancreas is normal and pancreatic duct is within normal limits.  The spleen is normal in size.  No focal lesions.  The adrenal glands and kidneys are normal and stable.  The stomach demonstrates surgical changes.  No mass or surrounding adenopathy.  The duodenum and small bowel are unremarkable.  No colonic abnormalities.  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  The major branch vessels are patent.  No mesenteric or retroperitoneal mass or adenopathy.  IMPRESSION:  1.  Expected surgical changes from a Whipple procedure.  No complicating features are demonstrated.  No worrisome mass lesion. Vague soft tissue density near the superior mesenteric vein is likely postoperative change but attention to this area on future scans is suggested. 2.  Findings suspicious for a hepatic metastatic lesion.  Close follow-up is recommended. 3.  Stable advanced atherosclerotic changes involving the aorta and stable mild aneurysmal dilatation.   Original Report Authenticated By: Rudie Meyer, M.D.    Nm Pet Image Initial (pi) Skull Base To Thigh  07/25/2012  *RADIOLOGY REPORT*  Clinical Data: Initial treatment strategy for pancreatic cancer.  NUCLEAR MEDICINE PET SKULL BASE TO THIGH  Fasting Blood Glucose:  90  Technique:  17.9 mCi F-18 FDG was injected intravenously. CT data was obtained and used for attenuation correction and anatomic localization only.  (This was not acquired as a diagnostic CT examination.) Additional exam technical data entered on technologist worksheet.  Comparison:  Multiple prior  CT scans.  Findings:  Neck: No hypermetabolic lymph nodes in the neck.  Chest:  No hypermetabolic mediastinal or hilar nodes.  No suspicious pulmonary nodules on the CT scan.  Abdomen/Pelvis:  No abnormal hypermetabolic activity within the liver, pancreas, adrenal glands, or spleen.  No hypermetabolic  lymph nodes in the abdomen or pelvis. Expected postoperative changes.  The abnormality in the right hepatic lobe on the CT scan does not show any definite abnormal FDG uptake.  Also, the small soft tissue density near the superior mesenteric vein does not show any FDG uptake.  No FDG positive mesenteric or retroperitoneal lymphadenopathy.  Skeleton:  No focal hypermetabolic activity to suggest skeletal metastasis.  IMPRESSION: No findings to suggest residual/recurrent neoplasm or metastatic disease.  The abnormality on the CT scan in the liver does need to be followed closely however.   Original Report Authenticated By: Rudie Meyer, M.D.    Dg Chest Port 1 View  07/24/2012  *RADIOLOGY REPORT*  Clinical Data: Port-A-Cath placement  PORTABLE CHEST - 1 VIEW  Comparison: July 03, 2012 and chest CT June 15, 2012  Findings:  Port-A-Cath tip is in the right atrium region.  No pneumothorax.  There is patchy consolidation in the right lower lobe region. There is mild scarring in the left base.  The lungs are otherwise clear.  The heart size and pulmonary vascularity are normal.  No adenopathy.  No bone lesions.  IMPRESSION:   Central catheter tip and right atrium region.  No pneumothorax.  Persistent air space consolidation right lower lobe. Given the persistence of this finding, correlation with bronchoscopy may well be warranted.   Original Report Authenticated By: Bretta Bang, M.D.    Dg Abd Acute W/chest  07/03/2012  *RADIOLOGY REPORT*  Clinical Data: Recent Whipple procedure.  Hiccups.  ACUTE ABDOMEN SERIES (ABDOMEN 2 VIEW & CHEST 1 VIEW)  Comparison: 05/27/2012; 06/15/2012  Findings: Persistent  airspace opacity noted at the right lung base. Linear plate-like opacity in the left lower lobe and potentially mild retrocardiac airspace opacity on the left.  Faint interstitial accentuation in the lung bases, possibly due to the low lung volumes.  No cardiomegaly.  Mild tortuosity of the thoracic aorta. Prior right-sided central lines have been removed.  No pneumothorax.  No free peritoneal gas is observed beneath the hemidiaphragms. Clips project over the right upper quadrant.  On the upright view, there are several scattered air-fluid levels in nondilated small bowel.  A midline drainage catheter is noted.  No centralization of bowel loops to suggest ascites.  IMPRESSION:  1.  Postoperative findings in the upper abdomen likely reflecting the patients reported recent Whipple procedure.  No free air.  The right-sided central lines have been removed.  A midline drainage catheter is present projecting over the mid abdomen. 2.  There are scattered air-fluid levels in nondilated small bowel, abnormal but nonspecific, potentially reflecting slight ileus or mild enteritis.  Gas and formed stool present in the colon.  No dilated bowel to suggest obstruction. 3.  Persistent airspace opacity in the right lower lobe, with a lesser degree of airspace opacity in the left lower lobe. Pneumonia is not readily excluded given the radiographic appearance.   Original Report Authenticated By: Gaylyn Rong, M.D.    Dg Fluoro Guide Cv Line-no Report  07/24/2012  CLINICAL DATA: port placement   FLOURO GUIDE CV LINE  Fluoroscopy was utilized by the requesting physician.  No radiographic  interpretation.      ASSESSMENT: 66 year old gentleman with  #1 periampullary carcinoma stage III patient is status post Whipple procedure overall she's done well with it. You will not receive adjuvant chemotherapy initially consisting of Gemzar for one cycle. He will then go on to receive concomitant chemoradiation with chemotherapy  consisting of xeloda. Risks and benefits of treatment  have been discussed with them completely.  #2 constipation  #3 dehydration   PLAN:  #1 patient will proceed with week 2 cycle 1 of Gemzar today.  #2 he'll receive IV fluids today and on Friday for dehydration.  #3 he'll be seen back in one week's time for week 3 of Gemzar along with that I recommended that he receive IV fluids with his chemotherapy as well as couple of days later to keep him from becoming dehydrated.  #4 patient will take senna once a day if to see if he has a response if he does not then he will increase the Senokot to today. We also discussed increasing his fluid intake at home we discussed possibility of using MiraLax prune juice and prunes.  All questions were answered. The patient knows to call the clinic with any problems, questions or concerns. We can certainly see the patient much sooner if necessary.  I spent 25 minutes counseling the patient face to face. The total time spent in the appointment was 30 minutes.    Drue Second, MD Medical/Oncology [facility] [phone] (beeper) [phone] (Office)  08/06/2012, 3:25 PM

## 2012-08-08 ENCOUNTER — Telehealth: Payer: Self-pay | Admitting: *Deleted

## 2012-08-08 ENCOUNTER — Ambulatory Visit (HOSPITAL_BASED_OUTPATIENT_CLINIC_OR_DEPARTMENT_OTHER): Payer: Medicare Other

## 2012-08-08 VITALS — BP 96/71 | Temp 97.7°F

## 2012-08-08 DIAGNOSIS — E860 Dehydration: Secondary | ICD-10-CM

## 2012-08-08 MED ORDER — SODIUM CHLORIDE 0.9 % IJ SOLN
10.0000 mL | INTRAMUSCULAR | Status: DC | PRN
  Administered 2012-08-08: 10 mL via INTRAVENOUS
  Filled 2012-08-08: qty 10

## 2012-08-08 MED ORDER — SODIUM CHLORIDE 0.9 % IV SOLN
INTRAVENOUS | Status: DC
  Administered 2012-08-08: 14:00:00 via INTRAVENOUS

## 2012-08-08 MED ORDER — HEPARIN SOD (PORK) LOCK FLUSH 100 UNIT/ML IV SOLN
500.0000 [IU] | Freq: Once | INTRAVENOUS | Status: AC
  Administered 2012-08-08: 500 [IU] via INTRAVENOUS
  Filled 2012-08-08: qty 5

## 2012-08-08 NOTE — Telephone Encounter (Signed)
Per staff message from Dr. Park Breed ok not to schedule fliuds for 12/26.   Colin Castro

## 2012-08-08 NOTE — Patient Instructions (Signed)
Dehydration, Adult Dehydration is when you lose more fluids from the body than you take in. Vital organs like the kidneys, brain, and heart cannot function without a proper amount of fluids and salt. Any loss of fluids from the body can cause dehydration.  CAUSES   Vomiting.  Diarrhea.  Excessive sweating.  Excessive urine output.  Fever. SYMPTOMS  Mild dehydration  Thirst.  Dry lips.  Slightly dry mouth. Moderate dehydration  Very dry mouth.  Sunken eyes.  Skin does not bounce back quickly when lightly pinched and released.  Dark urine and decreased urine production.  Decreased tear production.  Headache. Severe dehydration  Very dry mouth.  Extreme thirst.  Rapid, weak pulse (more than 100 beats per minute at rest).  Cold hands and feet.  Not able to sweat in spite of heat and temperature.  Rapid breathing.  Blue lips.  Confusion and lethargy.  Difficulty being awakened.  Minimal urine production.  No tears. DIAGNOSIS  Your caregiver will diagnose dehydration based on your symptoms and your exam. Blood and urine tests will help confirm the diagnosis. The diagnostic evaluation should also identify the cause of dehydration. TREATMENT  Treatment of mild or moderate dehydration can often be done at home by increasing the amount of fluids that you drink. It is best to drink small amounts of fluid more often. Drinking too much at one time can make vomiting worse. Refer to the home care instructions below. Severe dehydration needs to be treated at the hospital where you will probably be given intravenous (IV) fluids that contain water and electrolytes. HOME CARE INSTRUCTIONS   Ask your caregiver about specific rehydration instructions.  Drink enough fluids to keep your urine clear or pale yellow.  Drink small amounts frequently if you have nausea and vomiting.  Eat as you normally do.  Avoid:  Foods or drinks high in sugar.  Carbonated  drinks.  Juice.  Extremely hot or cold fluids.  Drinks with caffeine.  Fatty, greasy foods.  Alcohol.  Tobacco.  Overeating.  Gelatin desserts.  Wash your hands well to avoid spreading bacteria and viruses.  Only take over-the-counter or prescription medicines for pain, discomfort, or fever as directed by your caregiver.  Ask your caregiver if you should continue all prescribed and over-the-counter medicines.  Keep all follow-up appointments with your caregiver. SEEK MEDICAL CARE IF:  You have abdominal pain and it increases or stays in one area (localizes).  You have a rash, stiff neck, or severe headache.  You are irritable, sleepy, or difficult to awaken.  You are weak, dizzy, or extremely thirsty. SEEK IMMEDIATE MEDICAL CARE IF:   You are unable to keep fluids down or you get worse despite treatment.  You have frequent episodes of vomiting or diarrhea.  You have blood or green matter (bile) in your vomit.  You have blood in your stool or your stool looks black and tarry.  You have not urinated in 6 to 8 hours, or you have only urinated a small amount of very dark urine.  You have a fever.  You faint. MAKE SURE YOU:   Understand these instructions.  Will watch your condition.  Will get help right away if you are not doing well or get worse. Document Released: 08/06/2005 Document Revised: 10/29/2011 Document Reviewed: 03/26/2011 [employer] Patient Information 2013 [employer].  

## 2012-08-10 ENCOUNTER — Encounter: Payer: Self-pay | Admitting: Oncology

## 2012-08-10 NOTE — Progress Notes (Signed)
I was called by the patient's daughter today reporting a fever last night above 101. She gave tylenol and his temp normalized.  Now she feels he might be getting another temp.  His chart was reviewed he is S/P Gemzar on 12/18 with a normal ANC on that day. Port by her report looks OK and other wise asymptomatic.  I gave her instructions to go to the ED if any fever above 101 or if he develops any symptoms such as chest pain, sob, chills, rigors or further weakness.

## 2012-08-14 ENCOUNTER — Other Ambulatory Visit (HOSPITAL_BASED_OUTPATIENT_CLINIC_OR_DEPARTMENT_OTHER): Payer: Medicare Other | Admitting: Lab

## 2012-08-14 ENCOUNTER — Telehealth: Payer: Self-pay | Admitting: Oncology

## 2012-08-14 ENCOUNTER — Ambulatory Visit (HOSPITAL_BASED_OUTPATIENT_CLINIC_OR_DEPARTMENT_OTHER): Payer: Medicare Other | Admitting: Adult Health

## 2012-08-14 ENCOUNTER — Ambulatory Visit (HOSPITAL_BASED_OUTPATIENT_CLINIC_OR_DEPARTMENT_OTHER): Payer: Medicaid Other

## 2012-08-14 ENCOUNTER — Telehealth: Payer: Self-pay | Admitting: *Deleted

## 2012-08-14 ENCOUNTER — Encounter: Payer: Self-pay | Admitting: Adult Health

## 2012-08-14 VITALS — BP 106/74 | HR 105 | Temp 97.4°F | Resp 20 | Ht 66.0 in | Wt 130.6 lb

## 2012-08-14 DIAGNOSIS — Z5111 Encounter for antineoplastic chemotherapy: Secondary | ICD-10-CM

## 2012-08-14 DIAGNOSIS — C241 Malignant neoplasm of ampulla of Vater: Secondary | ICD-10-CM

## 2012-08-14 DIAGNOSIS — C801 Malignant (primary) neoplasm, unspecified: Secondary | ICD-10-CM

## 2012-08-14 DIAGNOSIS — E860 Dehydration: Secondary | ICD-10-CM

## 2012-08-14 LAB — CBC WITH DIFFERENTIAL/PLATELET
BASO%: 0.2 % (ref 0.0–2.0)
Basophils Absolute: 0 10*3/uL (ref 0.0–0.1)
EOS%: 0.9 % (ref 0.0–7.0)
Eosinophils Absolute: 0.1 10*3/uL (ref 0.0–0.5)
HCT: 38.9 % (ref 38.4–49.9)
HGB: 13.5 g/dL (ref 13.0–17.1)
LYMPH%: 40.5 % (ref 14.0–49.0)
MCH: 27.2 pg (ref 27.2–33.4)
MCHC: 34.7 g/dL (ref 32.0–36.0)
MCV: 78.4 fL — ABNORMAL LOW (ref 79.3–98.0)
MONO#: 0.8 10*3/uL (ref 0.1–0.9)
MONO%: 13.4 % (ref 0.0–14.0)
NEUT#: 2.7 10*3/uL (ref 1.5–6.5)
NEUT%: 45 % (ref 39.0–75.0)
Platelets: 136 10*3/uL — ABNORMAL LOW (ref 140–400)
RBC: 4.96 10*6/uL (ref 4.20–5.82)
RDW: 15.3 % — ABNORMAL HIGH (ref 11.0–14.6)
WBC: 5.9 10*3/uL (ref 4.0–10.3)
lymph#: 2.4 10*3/uL (ref 0.9–3.3)
nRBC: 0 % (ref 0–0)

## 2012-08-14 LAB — COMPREHENSIVE METABOLIC PANEL (CC13)
ALT: 135 U/L — ABNORMAL HIGH (ref 0–55)
AST: 65 U/L — ABNORMAL HIGH (ref 5–34)
Albumin: 3.1 g/dL — ABNORMAL LOW (ref 3.5–5.0)
CO2: 20 mEq/L — ABNORMAL LOW (ref 22–29)
Calcium: 9.2 mg/dL (ref 8.4–10.4)
Chloride: 106 mEq/L (ref 98–107)
Potassium: 4.1 mEq/L (ref 3.5–5.1)
Sodium: 138 mEq/L (ref 136–145)
Total Protein: 7.3 g/dL (ref 6.4–8.3)

## 2012-08-14 LAB — TECHNOLOGIST REVIEW

## 2012-08-14 MED ORDER — SODIUM CHLORIDE 0.9 % IV SOLN
1000.0000 mg/m2 | Freq: Once | INTRAVENOUS | Status: AC
  Administered 2012-08-14: 1672 mg via INTRAVENOUS
  Filled 2012-08-14: qty 44

## 2012-08-14 MED ORDER — SODIUM CHLORIDE 0.9 % IJ SOLN
10.0000 mL | INTRAMUSCULAR | Status: DC | PRN
  Administered 2012-08-14: 10 mL
  Filled 2012-08-14: qty 10

## 2012-08-14 MED ORDER — SODIUM CHLORIDE 0.9 % IV SOLN
Freq: Once | INTRAVENOUS | Status: AC
  Administered 2012-08-14: 15:00:00 via INTRAVENOUS

## 2012-08-14 MED ORDER — HEPARIN SOD (PORK) LOCK FLUSH 100 UNIT/ML IV SOLN
500.0000 [IU] | Freq: Once | INTRAVENOUS | Status: AC | PRN
  Administered 2012-08-14: 500 [IU]
  Filled 2012-08-14: qty 5

## 2012-08-14 MED ORDER — PROCHLORPERAZINE MALEATE 10 MG PO TABS
10.0000 mg | ORAL_TABLET | Freq: Once | ORAL | Status: AC
  Administered 2012-08-14: 10 mg via ORAL

## 2012-08-14 NOTE — Patient Instructions (Addendum)
[facility] Discharge Instructions for Patients Receiving Chemotherapy  Today you received the following chemotherapy agent Gemzar.  To help prevent nausea and vomiting after your treatment, we encourage you to take your nausea medication. Begin taking your nausea medication as often as prescribed for by Dr. Welton Flakes.    If you develop nausea and vomiting that is not controlled by your nausea medication, call the clinic. If it is after clinic hours your family physician or the after hours number for the clinic or go to the Emergency Department.   BELOW ARE SYMPTOMS THAT SHOULD BE REPORTED IMMEDIATELY:  *FEVER GREATER THAN 100.5 F  *CHILLS WITH OR WITHOUT FEVER  NAUSEA AND VOMITING THAT IS NOT CONTROLLED WITH YOUR NAUSEA MEDICATION  *UNUSUAL SHORTNESS OF BREATH  *UNUSUAL BRUISING OR BLEEDING  TENDERNESS IN MOUTH AND THROAT WITH OR WITHOUT PRESENCE OF ULCERS  *URINARY PROBLEMS  *BOWEL PROBLEMS  UNUSUAL RASH Items with * indicate a potential emergency and should be followed up as soon as possible.  One of the nurses will contact you 24 hours after your treatment. Please let the nurse know about any problems that you may have experienced. Feel free to call the clinic you have any questions or concerns. The clinic phone number is [phone].   I have been informed and understand all the instructions given to me. I know to contact the clinic, my physician, or go to the Emergency Department if any problems should occur. I do not have any questions at this time, but understand that I may call the clinic during office hours   should I have any questions or need assistance in obtaining follow up care.    __________________________________________  _____________  __________ Signature of Patient or Authorized Representative            Date                   Time    __________________________________________ Nurse's Signature

## 2012-08-14 NOTE — Telephone Encounter (Signed)
appts made and printed for pt aom °

## 2012-08-14 NOTE — Patient Instructions (Addendum)
Doing well.  Proceed with chemotherapy.  We will try to change the time of your IV fluids tomorrow.  Please call us if you have any questions or concerns.

## 2012-08-14 NOTE — Progress Notes (Signed)
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 He underwent pancreatic code duodenectomy. His final pathology revealed a well to moderately differentiated adenocarcinoma the tumor arose from high grade dysplasia involving small bowel mucosa in the ampulla invaded into the pancreas margins. The margins were negative LV I was noted one lymph node was positive for metastatic disease additional 14 lymph nodes were negative. Portal lymph node resection revealed 5 benign lymph nodes. Pathologically T3 N1.  #2 patient is now beginning adjuvant chemotherapy consisting of Gemzar x3 weeks.  #3 he was seen by Dr. Tarri Abernethy for adjuvant radiation therapy with radiosensitizing Xeloda.  CURRENT THERAPY: Patient will proceed with week #3 cycle 1 of Gemzar.  INTERVAL HISTORY: Colin Castro 66 y.o. male returns for followup visit he is accompanied by his wife as well as interpreter. He's doing well.  He's had a couple of days of low grade fevers in the 99-100 range.  He's been fatigued, has had decreased PO intake due to decreased taste.  Otherwise, he's been doing well.  He's had no fevers, chills, nausea, vomiting, headaches, diarrhea, constipation, shortness of breath, or any other questions/concerns.    MEDICAL HISTORY: Past Medical History  Diagnosis Date  . Hypertension   . High cholesterol   . Coronary artery disease   . Common bile duct (CBD) stricture     Malignant  . 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 Pneumonia   . Constipation   . Myocardial infarction     15 years ago    ALLERGIES:   has no known  allergies.  MEDICATIONS:  Current Outpatient Prescriptions  Medication Sig Dispense Refill  . acetaminophen (TYLENOL) 500 MG tablet Take 500 mg by mouth every 6 (six) hours as needed. For pain      . hydrOXYzine (ATARAX/VISTARIL) 10 MG tablet Take 10 mg by mouth 2 (two) times daily as needed. For itching      . lipase/protease/amylase (CREON-10/PANCREASE) 12000 UNITS CPEP Take 2 capsules by mouth 3 (three) times daily before meals.  270 capsule  2  . LORazepam (ATIVAN) 0.5 MG tablet Take 1 tablet (0.5 mg total) by mouth every 6 (six) hours as needed (Nausea or vomiting).  30 tablet  0  . megestrol (MEGACE) 400 MG/10ML suspension Take 400 mg by mouth 2 (two) times daily.       . Multiple Vitamin (MULTIVITAMIN WITH MINERALS) TABS Take 1 tablet by mouth daily.      . ondansetron (ZOFRAN) 4 MG tablet Take 4 mg by mouth every 6 (six) hours as needed. For nausea      . ondansetron (ZOFRAN) 8 MG tablet Take 1 tablet (8 mg total) by mouth 2 (two) times daily as needed (Nausea or vomiting).  30 tablet  1  . oxyCODONE-acetaminophen (PERCOCET/ROXICET) 5-325 MG per tablet Take 1-2 tablets by mouth every 4 (four) hours as needed. For pain  30 tablet  0  . pantoprazole (PROTONIX) 40 MG tablet Take 40 mg by mouth daily.      . polyethylene glycol powder (MIRALAX) powder Take 17 g by mouth daily.  255 g  4  . prochlorperazine (COMPAZINE) 10 MG tablet Take 1  tablet (10 mg total) by mouth every 6 (six) hours as needed (Nausea or vomiting).  30 tablet  1  . prochlorperazine (COMPAZINE) 25 MG suppository Place 1 suppository (25 mg total) rectally every 12 (twelve) hours as needed for nausea.  12 suppository  3  . QUEtiapine (SEROQUEL) 50 MG tablet Take 50 mg by mouth at bedtime as needed. For sleep.      [name] senna-docusate (SENNA S) 8.6-50 MG per tablet Take 1 tablet by mouth daily.  30 tablet  4   No current facility-administered medications for this visit.   Facility-Administered Medications Ordered in Other  Visits  Medication Dose Route Frequency Provider Last Rate Last Dose  . Gemcitabine HCl (GEMZAR) 1,672 mg in sodium chloride 0.9 % 100 mL chemo infusion  1,000 mg/m2 (Treatment Plan Actual) Intravenous Once Victorino December, MD 288 mL/hr at 08/14/12 1555 1,672 mg at 08/14/12 1555  . heparin lock flush 100 unit/mL  500 Units Intracatheter Once PRN Victorino December, MD      . sodium chloride 0.9 % injection 10 mL  10 mL Intracatheter PRN Victorino December, MD        SURGICAL HISTORY:  Past Surgical History  Procedure Date  . Ercp 05/07/2012    Procedure: ENDOSCOPIC RETROGRADE CHOLANGIOPANCREATOGRAPHY (ERCP);  Surgeon: [name], MD;  Location: [facility];  Service: Endoscopy;  Laterality: N/A;  Dr [name]  . Eus 05/07/2012    Procedure: UPPER ENDOSCOPIC ULTRASOUND (EUS) RADIAL;  Surgeon: [name], MD;  Location: [facility];  Service: Endoscopy;;  . Appendectomy     open, 25 yrs ago  . Coronary stent placement     2 stents placed  . Whipple procedure 06/06/2012    Procedure: WHIPPLE PROCEDURE;  Surgeon: [name], MD;  Location: [facility] OR;  Service: General;  Laterality: N/A;  Pancreatico duodenectomy  . Cholecystectomy 06/06/2012    Procedure: CHOLECYSTECTOMY;  Surgeon: [name], MD;  Location: [facility];  Service: General;  Laterality: N/A;  . Pancreatic stent placement 06/06/2012    Procedure: PANCREATIC STENT PLACEMENT;  Surgeon: [name], MD;  Location: [facility] OR;  Service: General;  Laterality: N/A;  Removal of biliary stent; Placement of pacreatic stent  . Lymph node dissection 06/06/2012    Procedure: LYMPH NODE DISSECTION;  Surgeon: [name], MD;  Location: [facility] OR;  Service: General;  Laterality: N/A;  Portal lymph node dissection  . Coronary angioplasty     8 or 9 years ago  . Portacath placement 07/24/2012    Procedure: INSERTION PORT-A-CATH;  Surgeon: [name], MD;  Location: [facility] OR;  Service: General;  Laterality: Left;    REVIEW OF SYSTEMS:   General: fatigue  (+), night sweats (-), fever (-), pain (-) Lymph: palpable nodes (-) HEENT: vision changes (-), mucositis (-), gum bleeding (-), epistaxis (-) Cardiovascular: chest pain (-), palpitations (-) Pulmonary: shortness of breath (-), dyspnea on exertion (-), cough (-), hemoptysis (-) GI:  Early satiety (-), melena (-), dysphagia (-), nausea/vomiting (-), diarrhea (-) GU: dysuria (-), hematuria (-), incontinence (-) Musculoskeletal: joint swelling (-), joint pain (-), back pain (-) Neuro: weakness (-), numbness (-), headache (-), confusion (-) Skin: Rash (-), lesions (-), dryness (-) Psych: depression (-), suicidal/homicidal ideation (-), feeling of hopelessness (-)   PHYSICAL EXAMINATION: Blood pressure 106/74, pulse 105, temperature 97.4 F (36.3 C), temperature source Oral, resp. rate 20, height 5\' 6"  (1.676 m), weight 130 lb 9.6 oz (59.24 kg). Body mass index is 21.08 kg/(m^2). General:  Patient is a well appearing male in no acute distress HEENT: PERRLA, sclerae anicteric no conjunctival pallor, MMM Neck: supple, no palpable adenopathy Lungs: clear to auscultation bilaterally, no wheezes, rhonchi, or rales Cardiovascular: regular rate rhythm, S1, S2, no murmurs, rubs or gallops Abdomen: Soft, non-tender, non-distended, normoactive bowel sounds, no HSM Extremities: warm and well perfused, no clubbing, cyanosis, or edema Skin: No rashes or lesions Neuro: Non-focal ECOG PERFORMANCE STATUS: 1 - Symptomatic but completely ambulatory   LABORATORY DATA: Lab Results  Component Value Date   WBC 5.9 08/14/2012   HGB 13.5 08/14/2012   HCT 38.9 08/14/2012   MCV 78.4* 08/14/2012   PLT 136* 08/14/2012      Chemistry      Component Value Date/Time   NA 142 08/06/2012 1421   NA 138 07/24/2012 0826   K 3.9 08/06/2012 1421   K 4.5 07/24/2012 0826   CL 106 08/06/2012 1421   CL 104 07/24/2012 0826   CO2 21* 08/06/2012 1421   CO2 23 07/24/2012 0826   BUN 18.0 08/06/2012 1421   BUN 13  07/24/2012 0826   CREATININE 0.8 08/06/2012 1421   CREATININE 0.91 07/24/2012 0826      Component Value Date/Time   CALCIUM 9.4 08/06/2012 1421   CALCIUM 9.6 07/24/2012 0826   ALKPHOS 111 08/06/2012 1421   ALKPHOS 124* 06/25/2012 0555   AST 26 08/06/2012 1421   AST 30 06/25/2012 0555   ALT 71* 08/06/2012 1421   ALT 56* 06/25/2012 0555   BILITOT 0.37 08/06/2012 1421   BILITOT 0.7 06/25/2012 0555       RADIOGRAPHIC STUDIES:  Ct Chest W Contrast  07/25/2012  *RADIOLOGY REPORT*  Clinical Data:  Staging pancreatic cancer.  CT CHEST, ABDOMEN AND PELVIS WITH CONTRAST  Technique:  Multidetector CT imaging of the chest, abdomen and pelvis was performed following the standard protocol during bolus administration of intravenous contrast.  Contrast: OMNIPAQUE IOHEXOL 300 MG/ML  SOLN  Comparison:  CT 06/15/2012.  CT CHEST  Findings:  A recently placed left-sided Port-A-Cath is noted.  No complicating features.  No supraclavicular or axillary lymphadenopathy.  Small scattered nodes are noted.  The thyroid gland is normal.  The bony thorax is intact.  No destructive bone lesions or spinal canal compromise.  The heart is normal in size.  No pericardial effusion.  No mediastinal or hilar lymphadenopathy.  The esophagus is grossly normal.  Moderate atherosclerotic calcifications involving the aorta but no focal aneurysm or dissection.  Dense coronary artery calcifications are noted.  Examination of the lung parenchyma demonstrate emphysematous changes and probable chronic bronchitic changes.  There is bibasilar atelectasis and mild lower lobe vascular crowding.  No definite nodules or masses.  IMPRESSION: No CT findings for metastatic disease involving the chest. Emphysematous changes and right basilar atelectasis but no worrisome pulmonary nodules.  CT ABDOMEN AND PELVIS  Findings:  There is marked fatty infiltration of the liver with areas of focal fatty sparing. There is a vague nodular area of enhancement in  the right hepatic lobe on the arterial phase images along with some surrounding vascular enhancement.  On the portal venous phase imaging this is a low attenuation area with slight surrounding enhancement.  Findings suspicious for a hepatic metastasis.  Close follow-up is suggested.  There are surgical changes from a Whipple procedure.  The pancreas is normal and pancreatic duct is within normal limits.  The spleen is normal in size.  No focal lesions.  The adrenal glands and kidneys  are normal and stable.  The stomach demonstrates surgical changes.  No mass or surrounding adenopathy.  The duodenum and small bowel are unremarkable.  No colonic abnormalities.  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  The major branch vessels are patent.  No mesenteric or retroperitoneal mass or adenopathy.  IMPRESSION:  1.  Expected surgical changes from a Whipple procedure.  No complicating features are demonstrated.  No worrisome mass lesion. Vague soft tissue density near the superior mesenteric vein is likely postoperative change but attention to this area on future scans is suggested. 2.  Findings suspicious for a hepatic metastatic lesion.  Close follow-up is recommended. 3.  Stable advanced atherosclerotic changes involving the aorta and stable mild aneurysmal dilatation.   Original Report Authenticated By: Rudie Meyer, M.D.    Ct Abdomen Pelvis W Contrast  07/25/2012  *RADIOLOGY REPORT*  Clinical Data:  Staging pancreatic cancer.  CT CHEST, ABDOMEN AND PELVIS WITH CONTRAST  Technique:  Multidetector CT imaging of the chest, abdomen and pelvis was performed following the standard protocol during bolus administration of intravenous contrast.  Contrast: OMNIPAQUE IOHEXOL 300 MG/ML  SOLN  Comparison:  CT 06/15/2012.  CT CHEST  Findings:  A recently placed left-sided Port-A-Cath is noted.  No complicating features.  No supraclavicular or axillary  lymphadenopathy.  Small scattered nodes are noted.  The thyroid gland is normal.  The bony thorax is intact.  No destructive bone lesions or spinal canal compromise.  The heart is normal in size.  No pericardial effusion.  No mediastinal or hilar lymphadenopathy.  The esophagus is grossly normal.  Moderate atherosclerotic calcifications involving the aorta but no focal aneurysm or dissection.  Dense coronary artery calcifications are noted.  Examination of the lung parenchyma demonstrate emphysematous changes and probable chronic bronchitic changes.  There is bibasilar atelectasis and mild lower lobe vascular crowding.  No definite nodules or masses.  IMPRESSION: No CT findings for metastatic disease involving the chest. Emphysematous changes and right basilar atelectasis but no worrisome pulmonary nodules.  CT ABDOMEN AND PELVIS  Findings:  There is marked fatty infiltration of the liver with areas of focal fatty sparing. There is a vague nodular area of enhancement in the right hepatic lobe on the arterial phase images along with some surrounding vascular enhancement.  On the portal venous phase imaging this is a low attenuation area with slight surrounding enhancement.  Findings suspicious for a hepatic metastasis.  Close follow-up is suggested.  There are surgical changes from a Whipple procedure.  The pancreas is normal and pancreatic duct is within normal limits.  The spleen is normal in size.  No focal lesions.  The adrenal glands and kidneys are normal and stable.  The stomach demonstrates surgical changes.  No mass or surrounding adenopathy.  The duodenum and small bowel are unremarkable.  No colonic abnormalities.  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  The major branch vessels are patent.  No mesenteric or retroperitoneal mass or adenopathy.  IMPRESSION:  1.  Expected surgical changes from a Whipple procedure.  No complicating  features are demonstrated.  No worrisome mass lesion. Vague soft tissue density near the superior mesenteric vein is likely postoperative change but attention to this area on future scans is suggested. 2.  Findings suspicious for a hepatic metastatic lesion.  Close follow-up is recommended. 3.  Stable advanced atherosclerotic changes involving the aorta and stable mild aneurysmal dilatation.  Original Report Authenticated By: Rudie Meyer, M.D.    Nm Pet Image Initial (pi) Skull Base To Thigh  07/25/2012  *RADIOLOGY REPORT*  Clinical Data: Initial treatment strategy for pancreatic cancer.  NUCLEAR MEDICINE PET SKULL BASE TO THIGH  Fasting Blood Glucose:  90  Technique:  17.9 mCi F-18 FDG was injected intravenously. CT data was obtained and used for attenuation correction and anatomic localization only.  (This was not acquired as a diagnostic CT examination.) Additional exam technical data entered on technologist worksheet.  Comparison:  Multiple prior CT scans.  Findings:  Neck: No hypermetabolic lymph nodes in the neck.  Chest:  No hypermetabolic mediastinal or hilar nodes.  No suspicious pulmonary nodules on the CT scan.  Abdomen/Pelvis:  No abnormal hypermetabolic activity within the liver, pancreas, adrenal glands, or spleen.  No hypermetabolic lymph nodes in the abdomen or pelvis. Expected postoperative changes.  The abnormality in the right hepatic lobe on the CT scan does not show any definite abnormal FDG uptake.  Also, the small soft tissue density near the superior mesenteric vein does not show any FDG uptake.  No FDG positive mesenteric or retroperitoneal lymphadenopathy.  Skeleton:  No focal hypermetabolic activity to suggest skeletal metastasis.  IMPRESSION: No findings to suggest residual/recurrent neoplasm or metastatic disease.  The abnormality on the CT scan in the liver does need to be followed closely however.   Original Report Authenticated By: Rudie Meyer, M.D.    Dg Chest Port 1  View  07/24/2012  *RADIOLOGY REPORT*  Clinical Data: Port-A-Cath placement  PORTABLE CHEST - 1 VIEW  Comparison: July 03, 2012 and chest CT June 15, 2012  Findings:  Port-A-Cath tip is in the right atrium region.  No pneumothorax.  There is patchy consolidation in the right lower lobe region. There is mild scarring in the left base.  The lungs are otherwise clear.  The heart size and pulmonary vascularity are normal.  No adenopathy.  No bone lesions.  IMPRESSION:   Central catheter tip and right atrium region.  No pneumothorax.  Persistent air space consolidation right lower lobe. Given the persistence of this finding, correlation with bronchoscopy may well be warranted.   Original Report Authenticated By: Bretta Bang, M.D.    Dg Abd Acute W/chest  07/03/2012  *RADIOLOGY REPORT*  Clinical Data: Recent Whipple procedure.  Hiccups.  ACUTE ABDOMEN SERIES (ABDOMEN 2 VIEW & CHEST 1 VIEW)  Comparison: 05/27/2012; 06/15/2012  Findings: Persistent airspace opacity noted at the right lung base. Linear plate-like opacity in the left lower lobe and potentially mild retrocardiac airspace opacity on the left.  Faint interstitial accentuation in the lung bases, possibly due to the low lung volumes.  No cardiomegaly.  Mild tortuosity of the thoracic aorta. Prior right-sided central lines have been removed.  No pneumothorax.  No free peritoneal gas is observed beneath the hemidiaphragms. Clips project over the right upper quadrant.  On the upright view, there are several scattered air-fluid levels in nondilated small bowel.  A midline drainage catheter is noted.  No centralization of bowel loops to suggest ascites.  IMPRESSION:  1.  Postoperative findings in the upper abdomen likely reflecting the patients reported recent Whipple procedure.  No free air.  The right-sided central lines have been removed.  A midline drainage catheter is present projecting over the mid abdomen. 2.  There are scattered air-fluid levels  in nondilated small bowel, abnormal but nonspecific, potentially reflecting slight ileus or mild enteritis.  Gas and formed stool present in the  colon.  No dilated bowel to suggest obstruction. 3.  Persistent airspace opacity in the right lower lobe, with a lesser degree of airspace opacity in the left lower lobe. Pneumonia is not readily excluded given the radiographic appearance.   Original Report Authenticated By: Gaylyn Rong, M.D.    Dg Fluoro Guide Cv Line-no Report  07/24/2012  CLINICAL DATA: port placement   FLOURO GUIDE CV LINE  Fluoroscopy was utilized by the requesting physician.  No radiographic  interpretation.      ASSESSMENT: 66 year old gentleman with  #1 periampullary carcinoma stage III patient is status post Whipple procedure overall she's done well with it. You will not receive adjuvant chemotherapy initially consisting of Gemzar for one cycle. He will then go on to receive concomitant chemoradiation with chemotherapy consisting of xeloda. Risks and benefits of treatment have been discussed with them completely.  #2 dehydration   PLAN:  #1 patient will proceed with week 3 cycle 1 of Gemzar today.  #2 he'll receive IV fluids today and tomorrow for his dehydration  #3We will see him back on 08/26/12 for f/u.    All questions were answered. The patient knows to call the clinic with any problems, questions or concerns. We can certainly see the patient much sooner if necessary.  I spent 25 minutes counseling the patient face to face. The total time spent in the appointment was 30 minutes.  Cherie Ouch Lyn Hollingshead, NP Medical Oncology [facility] Phone: [phone] 08/14/2012, 4:00 PM

## 2012-08-14 NOTE — Telephone Encounter (Signed)
Pt's daughter called states " My father has been running a fever for the last 3-4 days. He is coming in for chemo today and just wanted to let someone know."  Discussed with pt's daughter, pt will have labs and see NP prior to any treatment.  Pt's daughter verbalized understanding. No further concerns

## 2012-08-15 ENCOUNTER — Ambulatory Visit (HOSPITAL_BASED_OUTPATIENT_CLINIC_OR_DEPARTMENT_OTHER): Payer: Medicare Other

## 2012-08-15 VITALS — BP 135/69 | HR 78 | Temp 97.0°F | Resp 20

## 2012-08-15 DIAGNOSIS — E860 Dehydration: Secondary | ICD-10-CM

## 2012-08-15 DIAGNOSIS — C241 Malignant neoplasm of ampulla of Vater: Secondary | ICD-10-CM

## 2012-08-15 MED ORDER — HEPARIN SOD (PORK) LOCK FLUSH 100 UNIT/ML IV SOLN
500.0000 [IU] | Freq: Once | INTRAVENOUS | Status: AC
  Administered 2012-08-15: 500 [IU] via INTRAVENOUS
  Filled 2012-08-15: qty 5

## 2012-08-15 MED ORDER — SODIUM CHLORIDE 0.9 % IV SOLN
Freq: Once | INTRAVENOUS | Status: AC
  Administered 2012-08-15: 15:00:00 via INTRAVENOUS

## 2012-08-15 MED ORDER — SODIUM CHLORIDE 0.9 % IJ SOLN
10.0000 mL | INTRAMUSCULAR | Status: DC | PRN
  Administered 2012-08-15: 10 mL via INTRAVENOUS
  Filled 2012-08-15: qty 10

## 2012-08-18 ENCOUNTER — Inpatient Hospital Stay (HOSPITAL_COMMUNITY)
Admission: EM | Admit: 2012-08-18 | Discharge: 2012-08-30 | DRG: 385 | Disposition: A | Discharge status: Home / Self Care | Payer: Medicare Other | Attending: Cardiovascular Disease | Admitting: Cardiovascular Disease

## 2012-08-18 ENCOUNTER — Telehealth: Payer: Self-pay | Admitting: *Deleted

## 2012-08-18 ENCOUNTER — Telehealth: Payer: Self-pay | Admitting: Medical Oncology

## 2012-08-18 ENCOUNTER — Other Ambulatory Visit: Payer: Medicare Other | Admitting: Lab

## 2012-08-18 ENCOUNTER — Encounter (HOSPITAL_COMMUNITY): Payer: Self-pay | Admitting: *Deleted

## 2012-08-18 ENCOUNTER — Emergency Department (HOSPITAL_COMMUNITY): Payer: Medicare Other

## 2012-08-18 ENCOUNTER — Other Ambulatory Visit (HOSPITAL_BASED_OUTPATIENT_CLINIC_OR_DEPARTMENT_OTHER): Payer: Medicare Other | Admitting: Lab

## 2012-08-18 ENCOUNTER — Encounter: Payer: Self-pay | Admitting: Adult Health

## 2012-08-18 ENCOUNTER — Other Ambulatory Visit: Payer: Self-pay

## 2012-08-18 ENCOUNTER — Ambulatory Visit (HOSPITAL_BASED_OUTPATIENT_CLINIC_OR_DEPARTMENT_OTHER): Payer: Medicare Other | Admitting: Adult Health

## 2012-08-18 VITALS — BP 102/70 | HR 109 | Temp 97.7°F | Resp 20 | Ht 66.0 in | Wt 130.4 lb

## 2012-08-18 DIAGNOSIS — C801 Malignant (primary) neoplasm, unspecified: Secondary | ICD-10-CM

## 2012-08-18 DIAGNOSIS — R109 Unspecified abdominal pain: Secondary | ICD-10-CM

## 2012-08-18 DIAGNOSIS — Z90411 Acquired partial absence of pancreas: Secondary | ICD-10-CM

## 2012-08-18 DIAGNOSIS — A419 Sepsis, unspecified organism: Secondary | ICD-10-CM | POA: Diagnosis not present

## 2012-08-18 DIAGNOSIS — E871 Hypo-osmolality and hyponatremia: Secondary | ICD-10-CM | POA: Diagnosis not present

## 2012-08-18 DIAGNOSIS — C241 Malignant neoplasm of ampulla of Vater: Secondary | ICD-10-CM

## 2012-08-18 DIAGNOSIS — J189 Pneumonia, unspecified organism: Secondary | ICD-10-CM | POA: Diagnosis not present

## 2012-08-18 DIAGNOSIS — K5289 Other specified noninfective gastroenteritis and colitis: Secondary | ICD-10-CM | POA: Diagnosis not present

## 2012-08-18 DIAGNOSIS — R627 Adult failure to thrive: Secondary | ICD-10-CM | POA: Diagnosis present

## 2012-08-18 DIAGNOSIS — F411 Generalized anxiety disorder: Secondary | ICD-10-CM | POA: Diagnosis present

## 2012-08-18 DIAGNOSIS — C7889 Secondary malignant neoplasm of other digestive organs: Secondary | ICD-10-CM | POA: Diagnosis present

## 2012-08-18 DIAGNOSIS — I10 Essential (primary) hypertension: Secondary | ICD-10-CM | POA: Diagnosis present

## 2012-08-18 DIAGNOSIS — E860 Dehydration: Secondary | ICD-10-CM

## 2012-08-18 DIAGNOSIS — K5000 Crohn's disease of small intestine without complications: Principal | ICD-10-CM | POA: Diagnosis present

## 2012-08-18 DIAGNOSIS — Z9221 Personal history of antineoplastic chemotherapy: Secondary | ICD-10-CM

## 2012-08-18 DIAGNOSIS — E876 Hypokalemia: Secondary | ICD-10-CM | POA: Diagnosis not present

## 2012-08-18 DIAGNOSIS — G4700 Insomnia, unspecified: Secondary | ICD-10-CM | POA: Diagnosis present

## 2012-08-18 DIAGNOSIS — I2510 Atherosclerotic heart disease of native coronary artery without angina pectoris: Secondary | ICD-10-CM | POA: Diagnosis present

## 2012-08-18 DIAGNOSIS — I252 Old myocardial infarction: Secondary | ICD-10-CM

## 2012-08-18 DIAGNOSIS — F17200 Nicotine dependence, unspecified, uncomplicated: Secondary | ICD-10-CM | POA: Diagnosis present

## 2012-08-18 DIAGNOSIS — Z9089 Acquired absence of other organs: Secondary | ICD-10-CM

## 2012-08-18 DIAGNOSIS — K8689 Other specified diseases of pancreas: Secondary | ICD-10-CM | POA: Diagnosis present

## 2012-08-18 DIAGNOSIS — E7800 Pure hypercholesterolemia, unspecified: Secondary | ICD-10-CM | POA: Diagnosis present

## 2012-08-18 DIAGNOSIS — T451X5A Adverse effect of antineoplastic and immunosuppressive drugs, initial encounter: Secondary | ICD-10-CM | POA: Diagnosis present

## 2012-08-18 LAB — COMPREHENSIVE METABOLIC PANEL (CC13)
ALT: 333 U/L — ABNORMAL HIGH (ref 0–55)
AST: 158 U/L — ABNORMAL HIGH (ref 5–34)
Albumin: 2.9 g/dL — ABNORMAL LOW (ref 3.5–5.0)
BUN: 13 mg/dL (ref 7.0–26.0)
CO2: 21 mEq/L — ABNORMAL LOW (ref 22–29)
Calcium: 9.5 mg/dL (ref 8.4–10.4)
Chloride: 102 mEq/L (ref 98–107)
Creatinine: 0.9 mg/dL (ref 0.7–1.3)
Potassium: 4.2 mEq/L (ref 3.5–5.1)

## 2012-08-18 LAB — URINALYSIS, ROUTINE W REFLEX MICROSCOPIC
Glucose, UA: NEGATIVE mg/dL
Hgb urine dipstick: NEGATIVE
Protein, ur: NEGATIVE mg/dL
Urobilinogen, UA: 1 mg/dL (ref 0.0–1.0)

## 2012-08-18 LAB — HEPATIC FUNCTION PANEL
ALT: 328 U/L — ABNORMAL HIGH (ref 0–53)
Albumin: 3.4 g/dL — ABNORMAL LOW (ref 3.5–5.2)
Alkaline Phosphatase: 153 U/L — ABNORMAL HIGH (ref 39–117)
Total Bilirubin: 0.4 mg/dL (ref 0.3–1.2)

## 2012-08-18 LAB — CBC WITH DIFFERENTIAL/PLATELET
Basophils Absolute: 0 10*3/uL (ref 0.0–0.1)
Basophils Relative: 0 % (ref 0–1)
EOS%: 2.7 % (ref 0.0–7.0)
Eosinophils Absolute: 0.1 10*3/uL (ref 0.0–0.7)
Eosinophils Relative: 2 % (ref 0–5)
HCT: 37.8 % — ABNORMAL LOW (ref 38.4–49.9)
HCT: 39.4 % (ref 39.0–52.0)
Hemoglobin: 13.6 g/dL (ref 13.0–17.0)
MCH: 27.9 pg (ref 27.2–33.4)
MCH: 26.9 pg (ref 26.0–34.0)
MCHC: 34.5 g/dL (ref 30.0–36.0)
MCV: 78 fL (ref 78.0–100.0)
MONO#: 0.1 10*3/uL (ref 0.1–0.9)
Monocytes Absolute: 0.2 10*3/uL (ref 0.1–1.0)
Monocytes Relative: 4 % (ref 3–12)
NEUT%: 57.4 % (ref 39.0–75.0)
RDW: 16.4 % — ABNORMAL HIGH (ref 11.0–14.6)
lymph#: 1.5 10*3/uL (ref 0.9–3.3)

## 2012-08-18 LAB — BASIC METABOLIC PANEL
BUN: 14 mg/dL (ref 6–23)
Creatinine, Ser: 0.74 mg/dL (ref 0.50–1.35)
GFR calc Af Amer: >90 mL/min (ref >90)
GFR calc non Af Amer: >90 mL/min (ref >90)
Glucose, Bld: 100 mg/dL — ABNORMAL HIGH (ref 70–99)

## 2012-08-18 MED ORDER — IOHEXOL 300 MG/ML  SOLN
100.0000 mL | Freq: Once | INTRAMUSCULAR | Status: AC | PRN
  Administered 2012-08-18: 100 mL via INTRAVENOUS

## 2012-08-18 MED ORDER — HYDROMORPHONE HCL PF 1 MG/ML IJ SOLN
1.0000 mg | Freq: Once | INTRAMUSCULAR | Status: AC
  Administered 2012-08-18: 1 mg via INTRAVENOUS
  Filled 2012-08-18: qty 1

## 2012-08-18 MED ORDER — AMPICILLIN-SULBACTAM SODIUM 3 (2-1) G IJ SOLR
3.0000 g | Freq: Once | INTRAMUSCULAR | Status: AC
  Administered 2012-08-18: 3 g via INTRAVENOUS
  Filled 2012-08-18: qty 3

## 2012-08-18 MED ORDER — SODIUM CHLORIDE 0.9 % IV SOLN
INTRAVENOUS | Status: DC
  Administered 2012-08-18: via INTRAVENOUS

## 2012-08-18 MED ORDER — SODIUM CHLORIDE 0.9 % IV BOLUS (SEPSIS)
2000.0000 mL | Freq: Once | INTRAVENOUS | Status: AC
  Administered 2012-08-18: 2000 mL via INTRAVENOUS

## 2012-08-18 NOTE — ED Notes (Signed)
Pt states for the past 3 days has had abdominal pain, denies n/v/d. Denies urinary symptoms.

## 2012-08-18 NOTE — Telephone Encounter (Signed)
Needs to come in today to be evaluated ASAP.  Please make lab appt as well with CBC and STAT CMP.

## 2012-08-18 NOTE — Telephone Encounter (Signed)
Patient's daughter called and LVMOM stating that her father "over the weekend was complaining of abdominal pain. The stomach pain goes away with tylenol which he has been taking 3x per day OTC." Daughter states pain is "all-over stomach and per her father it's about a 7 out of 10 on the pain scale." She states he has been having bowel movements 2x/day and hasn't not been eating much d/t nausea. When asked if patient is taking his nausea medication, daughter states she doesn't think so and will encourage him to take it. She denies that he has been having fevers or other symptoms and states that tylenol is working, not for extended pain control,  but her concern is regarding the abdominal pain and would like to know what MD suggests. I informed daughter that MD is out of the office and I will forward mssg to NP. Patients f/u appts with NP 08/26/12 and lab appts 08/21/12 and 08/26/12, as well as appt 08/29/12 9:30 AM post op Hennie Duos with [name] MD.

## 2012-08-18 NOTE — Telephone Encounter (Signed)
Per NP pls add appt today with stat labs.

## 2012-08-18 NOTE — Progress Notes (Signed)
OFFICE PROGRESS NOTE  CC  [name], MD [address] 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 He underwent pancreatic code duodenectomy. His final pathology revealed a well to moderately differentiated adenocarcinoma the tumor arose from high grade dysplasia involving small bowel mucosa in the ampulla invaded into the pancreas margins. The margins were negative LV I was noted one lymph node was positive for metastatic disease additional 14 lymph nodes were negative. Portal lymph node resection revealed 5 benign lymph nodes. Pathologically T3 N1.  #2 patient is now beginning adjuvant chemotherapy consisting of Gemzar x3 weeks.  #3 he was seen by Dr. Tarri Abernethy for adjuvant radiation therapy with radiosensitizing Xeloda.  CURRENT THERAPY: Patient will proceed with week #3 cycle 1 of Gemzar.  INTERVAL HISTORY: Colin Castro 66 y.o. male returns for followup visit he is accompanied by his wife.  He's been experiencing abdominal pain for the past three-four days.  He cannot eat or drink b/c of it.  He is having bowel movements daily.  He denies fevers chills, blood in his stool or dark stool.  Otherwise he is having no problems.    MEDICAL HISTORY: Past Medical History  Diagnosis Date  . Hypertension   . High cholesterol   . Coronary artery disease   . Common bile duct (CBD) stricture     Malignant  . 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 Pneumonia   . Constipation   . Myocardial infarction     15 years ago    ALLERGIES:   has no known allergies.  MEDICATIONS:  Current Outpatient Prescriptions  Medication Sig Dispense Refill  . acetaminophen  (TYLENOL) 500 MG tablet Take 500 mg by mouth every 6 (six) hours as needed. For pain      . hydrOXYzine (ATARAX/VISTARIL) 10 MG tablet Take 10 mg by mouth 2 (two) times daily as needed. For itching      . lipase/protease/amylase (CREON-10/PANCREASE) 12000 UNITS CPEP Take 2 capsules by mouth 3 (three) times daily before meals.  270 capsule  2  . LORazepam (ATIVAN) 0.5 MG tablet Take 1 tablet (0.5 mg total) by mouth every 6 (six) hours as needed (Nausea or vomiting).  30 tablet  0  . megestrol (MEGACE) 400 MG/10ML suspension Take 400 mg by mouth 2 (two) times daily.       . Multiple Vitamin (MULTIVITAMIN WITH MINERALS) TABS Take 1 tablet by mouth daily.      . ondansetron (ZOFRAN) 4 MG tablet Take 4 mg by mouth every 6 (six) hours as needed. For nausea      . ondansetron (ZOFRAN) 8 MG tablet Take 1 tablet (8 mg total) by mouth 2 (two) times daily as needed (Nausea or vomiting).  30 tablet  1  . oxyCODONE-acetaminophen (PERCOCET/ROXICET) 5-325 MG per tablet Take 1-2 tablets by mouth every 4 (four) hours as needed. For pain  30 tablet  0  . pantoprazole (PROTONIX) 40 MG tablet Take 40 mg by mouth daily.      . polyethylene glycol powder (MIRALAX) powder Take 17 g by mouth daily.  255 g  4  . prochlorperazine (COMPAZINE) 10 MG tablet Take 1 tablet (10 mg total) by mouth every 6 (six) hours as needed (Nausea  or vomiting).  30 tablet  1  . prochlorperazine (COMPAZINE) 25 MG suppository Place 1 suppository (25 mg total) rectally every 12 (twelve) hours as needed for nausea.  12 suppository  3  . QUEtiapine (SEROQUEL) 50 MG tablet Take 50 mg by mouth at bedtime as needed. For sleep.      [name] senna-docusate (SENNA S) 8.6-50 MG per tablet Take 1 tablet by mouth daily.  30 tablet  4    SURGICAL HISTORY:  Past Surgical History  Procedure Date  . Ercp 05/07/2012    Procedure: ENDOSCOPIC RETROGRADE CHOLANGIOPANCREATOGRAPHY (ERCP);  Surgeon: [name], MD;  Location: [facility];  Service: Endoscopy;   Laterality: N/A;  Dr [name]  . Eus 05/07/2012    Procedure: UPPER ENDOSCOPIC ULTRASOUND (EUS) RADIAL;  Surgeon: [name], MD;  Location: [facility];  Service: Endoscopy;;  . Appendectomy     open, 25 yrs ago  . Coronary stent placement     2 stents placed  . Whipple procedure 06/06/2012    Procedure: WHIPPLE PROCEDURE;  Surgeon: [name], MD;  Location: [facility] OR;  Service: General;  Laterality: N/A;  Pancreatico duodenectomy  . Cholecystectomy 06/06/2012    Procedure: CHOLECYSTECTOMY;  Surgeon: [name], MD;  Location: [facility];  Service: General;  Laterality: N/A;  . Pancreatic stent placement 06/06/2012    Procedure: PANCREATIC STENT PLACEMENT;  Surgeon: [name], MD;  Location: [facility] OR;  Service: General;  Laterality: N/A;  Removal of biliary stent; Placement of pacreatic stent  . Lymph node dissection 06/06/2012    Procedure: LYMPH NODE DISSECTION;  Surgeon: [name], MD;  Location: [facility] OR;  Service: General;  Laterality: N/A;  Portal lymph node dissection  . Coronary angioplasty     8 or 9 years ago  . Portacath placement 07/24/2012    Procedure: INSERTION PORT-A-CATH;  Surgeon: [name], MD;  Location: [facility] OR;  Service: General;  Laterality: Left;    REVIEW OF SYSTEMS:   General: fatigue (+), night sweats (-), fever (-), pain (-) Lymph: palpable nodes (-) HEENT: vision changes (-), mucositis (-), gum bleeding (-), epistaxis (-) Cardiovascular: chest pain (-), palpitations (-) Pulmonary: shortness of breath (-), dyspnea on exertion (-), cough (-), hemoptysis (-) GI:  Early satiety (-), melena (-), dysphagia (-), nausea/vomiting (-), diarrhea (-) GU: dysuria (-), hematuria (-), incontinence (-) Musculoskeletal: joint swelling (-), joint pain (-), back pain (-) Neuro: weakness (-), numbness (-), headache (-), confusion (-) Skin: Rash (-), lesions (-), dryness (-) Psych: depression (-), suicidal/homicidal ideation (-), feeling of hopelessness (-)   PHYSICAL  EXAMINATION: Blood pressure 102/70, pulse 109, temperature 97.7 F (36.5 C), resp. rate 20, height 5\' 6"  (1.676 m), weight 130 lb 6.4 oz (59.149 kg). Body mass index is 21.05 kg/(m^2). General: Patient is a well appearing male in no acute distress HEENT: PERRLA, sclerae anicteric no conjunctival pallor, MMM Neck: supple, no palpable adenopathy Lungs: clear to auscultation bilaterally, no wheezes, rhonchi, or rales Cardiovascular: tachycardic, regular rhythm, S1, S2, no murmurs, rubs or gallops Abdomen: Soft, non-distended, normoactive bowel sounds, no HSM, extremely tender to palpation, particularly the RLQ, with rebound.  Patient guarding abdomen when moving on exam table and grimacing while moving.  Extremities: warm and well perfused, no clubbing, cyanosis, or edema Skin: No rashes or lesions Neuro: Non-focal ECOG PERFORMANCE STATUS: 1 - Symptomatic but completely ambulatory   LABORATORY DATA: Lab Results  Component Value Date   WBC 4.2 08/18/2012   HGB 13.0 08/18/2012   HCT  37.8* 08/18/2012   MCV 80.9 08/18/2012   PLT 310 08/18/2012      Chemistry      Component Value Date/Time   NA 135* 08/18/2012 1429   NA 138 07/24/2012 0826   K 4.2 08/18/2012 1429   K 4.5 07/24/2012 0826   CL 102 08/18/2012 1429   CL 104 07/24/2012 0826   CO2 21* 08/18/2012 1429   CO2 23 07/24/2012 0826   BUN 13.0 08/18/2012 1429   BUN 13 07/24/2012 0826   CREATININE 0.9 08/18/2012 1429   CREATININE 0.91 07/24/2012 0826      Component Value Date/Time   CALCIUM 9.5 08/18/2012 1429   CALCIUM 9.6 07/24/2012 0826   ALKPHOS 141 08/18/2012 1429   ALKPHOS 124* 06/25/2012 0555   AST 158* 08/18/2012 1429   AST 30 06/25/2012 0555   ALT 333* 08/18/2012 1429   ALT 56* 06/25/2012 0555   BILITOT 0.46 08/18/2012 1429   BILITOT 0.7 06/25/2012 0555       RADIOGRAPHIC STUDIES:  Ct Chest W Contrast  07/25/2012  *RADIOLOGY REPORT*  Clinical Data:  Staging pancreatic cancer.  CT CHEST, ABDOMEN AND PELVIS WITH  CONTRAST  Technique:  Multidetector CT imaging of the chest, abdomen and pelvis was performed following the standard protocol during bolus administration of intravenous contrast.  Contrast: OMNIPAQUE IOHEXOL 300 MG/ML  SOLN  Comparison:  CT 06/15/2012.  CT CHEST  Findings:  A recently placed left-sided Port-A-Cath is noted.  No complicating features.  No supraclavicular or axillary lymphadenopathy.  Small scattered nodes are noted.  The thyroid gland is normal.  The bony thorax is intact.  No destructive bone lesions or spinal canal compromise.  The heart is normal in size.  No pericardial effusion.  No mediastinal or hilar lymphadenopathy.  The esophagus is grossly normal.  Moderate atherosclerotic calcifications involving the aorta but no focal aneurysm or dissection.  Dense coronary artery calcifications are noted.  Examination of the lung parenchyma demonstrate emphysematous changes and probable chronic bronchitic changes.  There is bibasilar atelectasis and mild lower lobe vascular crowding.  No definite nodules or masses.  IMPRESSION: No CT findings for metastatic disease involving the chest. Emphysematous changes and right basilar atelectasis but no worrisome pulmonary nodules.  CT ABDOMEN AND PELVIS  Findings:  There is marked fatty infiltration of the liver with areas of focal fatty sparing. There is a vague nodular area of enhancement in the right hepatic lobe on the arterial phase images along with some surrounding vascular enhancement.  On the portal venous phase imaging this is a low attenuation area with slight surrounding enhancement.  Findings suspicious for a hepatic metastasis.  Close follow-up is suggested.  There are surgical changes from a Whipple procedure.  The pancreas is normal and pancreatic duct is within normal limits.  The spleen is normal in size.  No focal lesions.  The adrenal glands and kidneys are normal and stable.  The stomach demonstrates surgical changes.  No mass or  surrounding adenopathy.  The duodenum and small bowel are unremarkable.  No colonic abnormalities.  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  The major branch vessels are patent.  No mesenteric or retroperitoneal mass or adenopathy.  IMPRESSION:  1.  Expected surgical changes from a Whipple procedure.  No complicating features are demonstrated.  No worrisome mass lesion. Vague soft tissue density near the superior mesenteric vein is likely postoperative change but attention to this area on future scans  is suggested. 2.  Findings suspicious for a hepatic metastatic lesion.  Close follow-up is recommended. 3.  Stable advanced atherosclerotic changes involving the aorta and stable mild aneurysmal dilatation.   Original Report Authenticated By: Rudie Meyer, M.D.    Ct Abdomen Pelvis W Contrast  07/25/2012  *RADIOLOGY REPORT*  Clinical Data:  Staging pancreatic cancer.  CT CHEST, ABDOMEN AND PELVIS WITH CONTRAST  Technique:  Multidetector CT imaging of the chest, abdomen and pelvis was performed following the standard protocol during bolus administration of intravenous contrast.  Contrast: OMNIPAQUE IOHEXOL 300 MG/ML  SOLN  Comparison:  CT 06/15/2012.  CT CHEST  Findings:  A recently placed left-sided Port-A-Cath is noted.  No complicating features.  No supraclavicular or axillary lymphadenopathy.  Small scattered nodes are noted.  The thyroid gland is normal.  The bony thorax is intact.  No destructive bone lesions or spinal canal compromise.  The heart is normal in size.  No pericardial effusion.  No mediastinal or hilar lymphadenopathy.  The esophagus is grossly normal.  Moderate atherosclerotic calcifications involving the aorta but no focal aneurysm or dissection.  Dense coronary artery calcifications are noted.  Examination of the lung parenchyma demonstrate emphysematous changes and probable chronic bronchitic changes.  There is  bibasilar atelectasis and mild lower lobe vascular crowding.  No definite nodules or masses.  IMPRESSION: No CT findings for metastatic disease involving the chest. Emphysematous changes and right basilar atelectasis but no worrisome pulmonary nodules.  CT ABDOMEN AND PELVIS  Findings:  There is marked fatty infiltration of the liver with areas of focal fatty sparing. There is a vague nodular area of enhancement in the right hepatic lobe on the arterial phase images along with some surrounding vascular enhancement.  On the portal venous phase imaging this is a low attenuation area with slight surrounding enhancement.  Findings suspicious for a hepatic metastasis.  Close follow-up is suggested.  There are surgical changes from a Whipple procedure.  The pancreas is normal and pancreatic duct is within normal limits.  The spleen is normal in size.  No focal lesions.  The adrenal glands and kidneys are normal and stable.  The stomach demonstrates surgical changes.  No mass or surrounding adenopathy.  The duodenum and small bowel are unremarkable.  No colonic abnormalities.  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  The major branch vessels are patent.  No mesenteric or retroperitoneal mass or adenopathy.  IMPRESSION:  1.  Expected surgical changes from a Whipple procedure.  No complicating features are demonstrated.  No worrisome mass lesion. Vague soft tissue density near the superior mesenteric vein is likely postoperative change but attention to this area on future scans is suggested. 2.  Findings suspicious for a hepatic metastatic lesion.  Close follow-up is recommended. 3.  Stable advanced atherosclerotic changes involving the aorta and stable mild aneurysmal dilatation.   Original Report Authenticated By: Rudie Meyer, M.D.    Nm Pet Image Initial (pi) Skull Base To Thigh  07/25/2012  *RADIOLOGY REPORT*  Clinical Data: Initial treatment  strategy for pancreatic cancer.  NUCLEAR MEDICINE PET SKULL BASE TO THIGH  Fasting Blood Glucose:  90  Technique:  17.9 mCi F-18 FDG was injected intravenously. CT data was obtained and used for attenuation correction and anatomic localization only.  (This was not acquired as a diagnostic CT examination.) Additional exam technical data entered on technologist worksheet.  Comparison:  Multiple prior CT scans.  Findings:  Neck: No  hypermetabolic lymph nodes in the neck.  Chest:  No hypermetabolic mediastinal or hilar nodes.  No suspicious pulmonary nodules on the CT scan.  Abdomen/Pelvis:  No abnormal hypermetabolic activity within the liver, pancreas, adrenal glands, or spleen.  No hypermetabolic lymph nodes in the abdomen or pelvis. Expected postoperative changes.  The abnormality in the right hepatic lobe on the CT scan does not show any definite abnormal FDG uptake.  Also, the small soft tissue density near the superior mesenteric vein does not show any FDG uptake.  No FDG positive mesenteric or retroperitoneal lymphadenopathy.  Skeleton:  No focal hypermetabolic activity to suggest skeletal metastasis.  IMPRESSION: No findings to suggest residual/recurrent neoplasm or metastatic disease.  The abnormality on the CT scan in the liver does need to be followed closely however.   Original Report Authenticated By: Rudie Meyer, M.D.    Dg Chest Port 1 View  07/24/2012  *RADIOLOGY REPORT*  Clinical Data: Port-A-Cath placement  PORTABLE CHEST - 1 VIEW  Comparison: July 03, 2012 and chest CT June 15, 2012  Findings:  Port-A-Cath tip is in the right atrium region.  No pneumothorax.  There is patchy consolidation in the right lower lobe region. There is mild scarring in the left base.  The lungs are otherwise clear.  The heart size and pulmonary vascularity are normal.  No adenopathy.  No bone lesions.  IMPRESSION:   Central catheter tip and right atrium region.  No pneumothorax.  Persistent air space  consolidation right lower lobe. Given the persistence of this finding, correlation with bronchoscopy may well be warranted.   Original Report Authenticated By: Bretta Bang, M.D.    Dg Abd Acute W/chest  07/03/2012  *RADIOLOGY REPORT*  Clinical Data: Recent Whipple procedure.  Hiccups.  ACUTE ABDOMEN SERIES (ABDOMEN 2 VIEW & CHEST 1 VIEW)  Comparison: 05/27/2012; 06/15/2012  Findings: Persistent airspace opacity noted at the right lung base. Linear plate-like opacity in the left lower lobe and potentially mild retrocardiac airspace opacity on the left.  Faint interstitial accentuation in the lung bases, possibly due to the low lung volumes.  No cardiomegaly.  Mild tortuosity of the thoracic aorta. Prior right-sided central lines have been removed.  No pneumothorax.  No free peritoneal gas is observed beneath the hemidiaphragms. Clips project over the right upper quadrant.  On the upright view, there are several scattered air-fluid levels in nondilated small bowel.  A midline drainage catheter is noted.  No centralization of bowel loops to suggest ascites.  IMPRESSION:  1.  Postoperative findings in the upper abdomen likely reflecting the patients reported recent Whipple procedure.  No free air.  The right-sided central lines have been removed.  A midline drainage catheter is present projecting over the mid abdomen. 2.  There are scattered air-fluid levels in nondilated small bowel, abnormal but nonspecific, potentially reflecting slight ileus or mild enteritis.  Gas and formed stool present in the colon.  No dilated bowel to suggest obstruction. 3.  Persistent airspace opacity in the right lower lobe, with a lesser degree of airspace opacity in the left lower lobe. Pneumonia is not readily excluded given the radiographic appearance.   Original Report Authenticated By: Gaylyn Rong, M.D.    Dg Fluoro Guide Cv Line-no Report  07/24/2012  CLINICAL DATA: port placement   FLOURO GUIDE CV LINE  Fluoroscopy  was utilized by the requesting physician.  No radiographic  interpretation.      ASSESSMENT: 66 year old gentleman with  #1 periampullary carcinoma stage III patient is  status post Whipple procedure overall she's done well with it. You will not receive adjuvant chemotherapy initially consisting of Gemzar for one cycle. He will then go on to receive concomitant chemoradiation with chemotherapy consisting of xeloda. Risks and benefits of treatment have been discussed with them completely.  #2 abd pain   PLAN:  #1 Due to the degree of the patients pain on the exam table and during the abd exam I will send him to the emergency room to be evaluated.    All questions were answered. The patient knows to call the clinic with any problems, questions or concerns. We can certainly see the patient much sooner if necessary.  I spent 25 minutes counseling the patient face to face. The total time spent in the appointment was 30 minutes.  Cherie Ouch Lyn Hollingshead, NP Medical Oncology [facility] Phone: [phone] 08/18/2012, 4:04 PM

## 2012-08-18 NOTE — ED Provider Notes (Signed)
History     CSN: [id number]  Arrival date & time 08/18/12  1551   First MD Initiated Contact with Patient 08/18/12 2101      Chief Complaint  Patient presents with  . 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 He's had no nausea vomiting or diarrhea. He is having normal bowel movements. He is no dysuria or testicular pain. He is no chest pain cough shortness of breath or fever. There is no altered mental status. He was seen by the cancer clinic today and instructed to come to the emergency room. He had recent CT scan and PET scan this month showing questionable metastasis to the liver but that was indefinite and no other acute changes noted. Past Medical History  Diagnosis Date  . Hypertension   . High cholesterol   . Coronary artery disease   . Common bile duct (CBD) stricture     Malignant  . 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 Pneumonia   . Constipation   . Myocardial infarction     15 years ago    Past Surgical History  Procedure Date  . Ercp 05/07/2012    Procedure: ENDOSCOPIC RETROGRADE CHOLANGIOPANCREATOGRAPHY (ERCP);  Surgeon: [name], MD;  Location: [facility];  Service: Endoscopy;  Laterality: N/A;  Dr [name]  . Eus 05/07/2012    Procedure: UPPER ENDOSCOPIC ULTRASOUND (EUS) RADIAL;  Surgeon: [name], MD;  Location: [facility];  Service: Endoscopy;;  . Appendectomy     open, 25 yrs ago   . Coronary stent placement     2 stents placed  . Whipple procedure 06/06/2012    Procedure: WHIPPLE PROCEDURE;  Surgeon: [name], MD;  Location: [facility] OR;  Service: General;  Laterality: N/A;  Pancreatico duodenectomy  . Cholecystectomy 06/06/2012    Procedure: CHOLECYSTECTOMY;  Surgeon: [name], MD;  Location: [facility] OR;  Service: General;  Laterality: N/A;  . Pancreatic stent placement 06/06/2012    Procedure: PANCREATIC STENT PLACEMENT;  Surgeon: [name], MD;  Location: [facility] OR;  Service: General;  Laterality: N/A;  Removal of biliary stent; Placement of pacreatic stent  . Lymph node dissection 06/06/2012    Procedure: LYMPH NODE DISSECTION;  Surgeon: [name], MD;  Location: [facility] OR;  Service: General;  Laterality: N/A;  Portal lymph node dissection  . Coronary angioplasty     8 or 9 years ago  . Portacath placement 07/24/2012    Procedure: INSERTION PORT-A-CATH;  Surgeon: [name], MD;  Location: [facility] OR;  Service: General;  Laterality: Left;    Family History  Problem Relation Age of Onset  . Ovarian cancer Mother     lived 25 years past diagnosis    History  Substance Use Topics  . Smoking status: Current Every Day Smoker -- 0.2 packs/day for 48 years    Types: Cigarettes  . Smokeless tobacco: Never Used  . Alcohol Use: No      Review of Systems 10 Systems reviewed and are negative for acute change except as  noted in the HPI. Allergies  Review of patient's allergies indicates no known allergies.  Home Medications   No current outpatient prescriptions on file.  BP 103/52  Pulse 77  Temp 98.6 F (37 C) (Oral)  Resp 16  Ht 5\' 6"  (1.676 m)  Wt 131 lb 2.8 oz (59.5 kg)  BMI 21.17 kg/m2  SpO2 96%  Physical Exam  Nursing note and vitals reviewed. Constitutional:       Awake, alert, nontoxic appearance.  HENT:  Head: Atraumatic.  Eyes: Right eye exhibits no discharge. Left eye exhibits no discharge.  Neck: Neck supple.  Cardiovascular: Regular  rhythm.   No murmur heard.      Tachycardic  Pulmonary/Chest: Effort normal. No respiratory distress. He has no wheezes. He has no rales. He exhibits no tenderness.  Abdominal: Soft. He exhibits no distension and no mass. There is tenderness. There is rebound and guarding.       Bowel sounds absent, the patient has moderate epigastric tenderness, he has no left sided abdominal tenderness, he has moderate to severe right lower quadrant tenderness including percussion tenderness and localized rebound without generalized peritonitis. His testicles are descended and nontender without palpable inguinal hernias.  Musculoskeletal: He exhibits no edema and no tenderness.       Baseline ROM, no obvious new focal weakness.  Neurological: He is alert.       Mental status and motor strength appears baseline for patient and situation.  Skin: No rash noted.  Psychiatric: He has a normal mood and affect.    ED Course  Procedures (including critical care time) ECG: Sinus rhythm, ventricular rate 90, normal axis, inferior Q waves, nonspecific ST changes laterally is, no significant change noted compared with October 2013 Labs Reviewed  CBC WITH DIFFERENTIAL - Abnormal; Notable for the following:    Platelets 412 (*)     All other components within normal limits  BASIC METABOLIC PANEL - Abnormal; Notable for the following:    Glucose, Bld 100 (*)     All other components within normal limits  URINALYSIS, ROUTINE W REFLEX MICROSCOPIC - Abnormal; Notable for the following:    Color, Urine AMBER (*)  BIOCHEMICALS MAY BE AFFECTED BY COLOR   Specific Gravity, Urine 1.036 (*)     Bilirubin Urine SMALL (*)     Ketones, ur TRACE (*)     All other components within normal limits  HEPATIC FUNCTION PANEL - Abnormal; Notable for the following:    Albumin 3.4 (*)     AST 152 (*)     ALT 328 (*)     Alkaline Phosphatase 153 (*)     All other components within normal limits  CBC - Abnormal; Notable for the  following:    WBC 2.7 (*)     RBC 3.89 (*)     Hemoglobin 10.3 (*)     HCT 30.5 (*)     All other components within normal limits  COMPREHENSIVE METABOLIC PANEL - Abnormal; Notable for the following:    Sodium 132 (*)     Potassium 3.4 (*)     Calcium 7.8 (*)     Total Protein 5.8 (*)     Albumin 2.3 (*)     AST 86 (*)     ALT 204 (*)     All other components within normal limits  LIPASE, BLOOD  MRSA PCR SCREENING   Ct Abdomen Pelvis W Contrast  08/18/2012  *RADIOLOGY REPORT*  Clinical Data: 66 year old male  abdominal pain.  History of pancreatic carcinoma status post surgery  CT ABDOMEN AND PELVIS WITH CONTRAST  Technique:  Multidetector CT imaging of the abdomen and pelvis was performed following the standard protocol during bolus administration of intravenous contrast.  Contrast: OMNIPAQUE IOHEXOL 300 MG/ML  SOLN  Comparison: 07/25/2012 and earlier.  Findings: Half mildly improved ventilation at the right lung base. Suspect a combination of residual scarring and atelectasis. Superimposed increased bibasilar interstitial markings.  No lung base mass or effusion identified.  No pericardial effusion.  Calcified atherosclerosis.  No lower mediastinal lymphadenopathy.  No acute or suspicious osseous lesion identified.  No pelvic free fluid.  Stable fat containing inguinal hernias. Decompressed bladder.  Distal colon decompressed and within normal limits.  Left:  Within normal limits.  Mild motion artifact through the upper abdomen.  This may explain mild indistinctness of the transverse and right colon.  No definite colonic wall thickening.  Thickening and/or mucosal hyperenhancement of the distal ileum (series 2 image 88, 71) with mild surrounding mesenteric stranding. No dilated small bowel.  Oral contrast has not yet reached the abnormal loops.  Extensive postoperative changes to the upper abdomen. Biliary gas has resolved. Subtle heterogeneity in the inferior right hepatic lobe (series 2  image 31) corresponding to the area of early arterial enhancement described recently.  No other suspicious liver lesion.  .  Portal venous system within normal limits.  Infrarenal abdominal aortic aneurysm with prominent mural thrombus or plaque is stable measuring 29 mm diameter.  Major arterial structures in the abdomen and pelvis appear patent.  Spleen, adrenal glands and kidneys within normal limits. Postoperative changes to the porta hepatis and upper abdomen are stable.  Decreased lymph node adjacent to the portal vein and SMA. Irregularity of the duodenum but no discrete duodenal mass. Gastrojejunostomy.  No abdominal free fluid.  No new or increased abdominal lymph nodes.  Postoperative changes to the ventral abdomen.  IMPRESSION: 1.  Inflamed distal small bowel compatible with nonspecific enteritis.  Favor infectious enteritis.  No associated bowel obstruction. 2.  Postoperative changes in the upper abdomen with no adverse features. 3. Mild heterogeneity of the right hepatic lobe again noted. Metastatic disease not excluded.  See CT staging report 07/25/2012. 4.  Other chronic findings including infrarenal abdominal aortic aneurysm, and lower lobe lung disease.   Original Report Authenticated By: Erskine Speed, M.D.      1. Ileitis, regional   2. Cancer   3. Ampullary carcinoma       MDM  Patient / Family / Caregiver informed of clinical course, understand medical decision-making process, and agree with plan.d/w [id number] for admit. The patient appears reasonably stabilized for admission considering the current resources, flow, and capabilities available in the ED at this time, and I doubt any other [facility] requiring further screening and/or treatment in the ED prior to admission.        Hurman Horn, MD 08/19/12 [phone]

## 2012-08-18 NOTE — Patient Instructions (Addendum)
You need to go to the Emergency Room for your abdominal pain.

## 2012-08-18 NOTE — Telephone Encounter (Signed)
Daughter called back to inform office that pt can come in @ 2pm. NP informed.

## 2012-08-18 NOTE — Telephone Encounter (Signed)
Per NP returned pt's daughter's call and informed her that NP would like for pt to come in for evaluation ASAP and for labs. Daughter stated that she will let her father know and will call office back to inform us that he has received message. No further questions at this time. Will inform scheduling of appts.

## 2012-08-19 ENCOUNTER — Encounter (HOSPITAL_COMMUNITY): Payer: Self-pay | Admitting: *Deleted

## 2012-08-19 LAB — COMPREHENSIVE METABOLIC PANEL
ALT: 204 U/L — ABNORMAL HIGH (ref 0–53)
AST: 86 U/L — ABNORMAL HIGH (ref 0–37)
Calcium: 7.8 mg/dL — ABNORMAL LOW (ref 8.4–10.5)
Sodium: 132 mEq/L — ABNORMAL LOW (ref 135–145)
Total Protein: 5.8 g/dL — ABNORMAL LOW (ref 6.0–8.3)

## 2012-08-19 LAB — CBC
MCH: 26.5 pg (ref 26.0–34.0)
MCHC: 33.8 g/dL (ref 30.0–36.0)
Platelets: 293 10*3/uL (ref 150–400)
RBC: 3.89 MIL/uL — ABNORMAL LOW (ref 4.22–5.81)

## 2012-08-19 MED ORDER — POLYETHYLENE GLYCOL 3350 17 G PO PACK
17.0000 g | PACK | Freq: Every day | ORAL | Status: DC
  Administered 2012-08-19 – 2012-08-23 (×4): 17 g via ORAL
  Filled 2012-08-19 (×5): qty 1

## 2012-08-19 MED ORDER — HEPARIN SODIUM (PORCINE) 5000 UNIT/ML IJ SOLN
5000.0000 [IU] | Freq: Three times a day (TID) | INTRAMUSCULAR | Status: DC
  Administered 2012-08-19 – 2012-08-30 (×34): 5000 [IU] via SUBCUTANEOUS
  Filled 2012-08-19 (×37): qty 1

## 2012-08-19 MED ORDER — PANCRELIPASE (LIP-PROT-AMYL) 12000-38000 UNITS PO CPEP
2.0000 | ORAL_CAPSULE | Freq: Three times a day (TID) | ORAL | Status: DC
  Administered 2012-08-19 – 2012-08-23 (×11): 2 via ORAL
  Filled 2012-08-19 (×16): qty 2

## 2012-08-19 MED ORDER — DEXTROSE-NACL 5-0.45 % IV SOLN
INTRAVENOUS | Status: DC
  Administered 2012-08-19 (×2): via INTRAVENOUS

## 2012-08-19 MED ORDER — ONDANSETRON HCL 8 MG PO TABS
8.0000 mg | ORAL_TABLET | Freq: Two times a day (BID) | ORAL | Status: DC | PRN
  Administered 2012-08-21: 8 mg via ORAL
  Filled 2012-08-19: qty 1

## 2012-08-19 MED ORDER — CIPROFLOXACIN IN D5W 400 MG/200ML IV SOLN
400.0000 mg | Freq: Two times a day (BID) | INTRAVENOUS | Status: DC
  Administered 2012-08-19 – 2012-08-21 (×6): 400 mg via INTRAVENOUS
  Filled 2012-08-19 (×7): qty 200

## 2012-08-19 MED ORDER — POTASSIUM CHLORIDE CRYS ER 20 MEQ PO TBCR
20.0000 meq | EXTENDED_RELEASE_TABLET | Freq: Two times a day (BID) | ORAL | Status: DC
  Administered 2012-08-19 – 2012-08-20 (×4): 20 meq via ORAL
  Filled 2012-08-19 (×6): qty 1

## 2012-08-19 MED ORDER — METRONIDAZOLE 500 MG PO TABS
500.0000 mg | ORAL_TABLET | Freq: Three times a day (TID) | ORAL | Status: DC
  Administered 2012-08-19 – 2012-08-28 (×28): 500 mg via ORAL
  Filled 2012-08-19 (×34): qty 1

## 2012-08-19 MED ORDER — OXYCODONE-ACETAMINOPHEN 5-325 MG PO TABS
1.0000 | ORAL_TABLET | ORAL | Status: DC | PRN
  Administered 2012-08-22: 1 via ORAL
  Administered 2012-08-22 – 2012-08-26 (×4): 2 via ORAL
  Filled 2012-08-19 (×7): qty 2

## 2012-08-19 MED ORDER — ONDANSETRON HCL 4 MG PO TABS: 4.0000 mg | ORAL_TABLET | Freq: Four times a day (QID) | ORAL | Status: DC | PRN

## 2012-08-19 MED ORDER — LORAZEPAM 0.5 MG PO TABS
0.5000 mg | ORAL_TABLET | Freq: Four times a day (QID) | ORAL | Status: DC | PRN
  Administered 2012-08-26 – 2012-08-28 (×3): 0.5 mg via ORAL
  Filled 2012-08-19 (×3): qty 1

## 2012-08-19 MED ORDER — PANTOPRAZOLE SODIUM 40 MG PO TBEC
40.0000 mg | DELAYED_RELEASE_TABLET | Freq: Every day | ORAL | Status: DC
  Administered 2012-08-19 – 2012-08-30 (×12): 40 mg via ORAL
  Filled 2012-08-19 (×12): qty 1

## 2012-08-19 MED ORDER — HYDROMORPHONE HCL PF 1 MG/ML IJ SOLN
1.0000 mg | INTRAMUSCULAR | Status: DC | PRN
  Administered 2012-08-19 – 2012-08-30 (×34): 1 mg via INTRAVENOUS
  Filled 2012-08-19 (×35): qty 1

## 2012-08-19 MED ORDER — POLYETHYLENE GLYCOL 3350 17 GM/SCOOP PO POWD
17.0000 g | Freq: Every day | ORAL | Status: DC
  Filled 2012-08-19: qty 255

## 2012-08-19 NOTE — H&P (Signed)
Colin Castro is an 66 y.o. male.   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 He's had no nausea vomiting or diarrhea. He is having normal bowel movements. He is no dysuria or testicular pain. He is no chest pain cough shortness of breath or fever. There is no altered mental status. He was seen by the cancer clinic today and instructed to come to the emergency room. He had recent CT scan and PET scan this month showing questionable metastasis to the liver but that was indefinite and no other acute changes noted.   Past Medical History  Diagnosis Date  . Hypertension   . High cholesterol   . Coronary artery disease   . Common bile duct (CBD) stricture     Malignant  . 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 Pneumonia   . Constipation   . Myocardial infarction     15 years ago      Past Surgical History  Procedure Date  . Ercp 05/07/2012    Procedure: ENDOSCOPIC RETROGRADE CHOLANGIOPANCREATOGRAPHY (ERCP);  Surgeon: [name], MD;  Location: [facility];  Service: Endoscopy;  Laterality: N/A;  Dr [name]  . Eus 05/07/2012    Procedure: UPPER ENDOSCOPIC ULTRASOUND (EUS) RADIAL;  Surgeon: [name], MD;  Location: [facility];  Service: Endoscopy;;  . Appendectomy     open, 25 yrs ago  . Coronary stent placement     2 stents placed  . Whipple procedure 06/06/2012    Procedure: WHIPPLE PROCEDURE;  Surgeon: [name], MD;  Location: [facility] OR;  Service: General;  Laterality: N/A;  Pancreatico duodenectomy  . Cholecystectomy 06/06/2012    Procedure:  CHOLECYSTECTOMY;  Surgeon: [name], MD;  Location: [facility] OR;  Service: General;  Laterality: N/A;  . Pancreatic stent placement 06/06/2012    Procedure: PANCREATIC STENT PLACEMENT;  Surgeon: [name], MD;  Location: [facility] OR;  Service: General;  Laterality: N/A;  Removal of biliary stent; Placement of pacreatic stent  . Lymph node dissection 06/06/2012    Procedure: LYMPH NODE DISSECTION;  Surgeon: [name], MD;  Location: [facility] OR;  Service: General;  Laterality: N/A;  Portal lymph node dissection  . Coronary angioplasty     8 or 9 years ago  . Portacath placement 07/24/2012    Procedure: INSERTION PORT-A-CATH;  Surgeon: [name], MD;  Location: [facility] OR;  Service: General;  Laterality: Left;    Family History  Problem Relation Age of Onset  . Ovarian cancer Mother     lived 25 years past diagnosis   Social History:  reports that he has been smoking Cigarettes.  He has a 9.6 pack-year smoking history. He has never used smokeless tobacco. He reports that he does not drink alcohol or use illicit drugs.  Allergies: No Known Allergies   (Not in a hospital admission)  Results for orders placed during the hospital encounter of 08/18/12 (from the past 48 hour(s))  CBC WITH DIFFERENTIAL     Status: Abnormal   Collection Time   08/18/12  6:17 PM      Component Value Range Comment  WBC 4.1  4.0 - 10.5 K/uL    RBC 5.05  4.22 - 5.81 MIL/uL    Hemoglobin 13.6  13.0 - 17.0 g/dL    HCT 02.7  25.3 - 66.4 %    MCV 78.0  78.0 - 100.0 fL    MCH 26.9  26.0 - 34.0 pg    MCHC 34.5  30.0 - 36.0 g/dL    RDW 40.3  47.4 - 25.9 %    Platelets 412 (*) 150 - 400 K/uL    Neutrophils Relative 54  43 - 77 %    Neutro Abs 2.2  1.7 - 7.7 K/uL    Lymphocytes Relative 40  12 - 46 %    Lymphs Abs 1.6  0.7 - 4.0 K/uL    Monocytes Relative 4  3 - 12 %    Monocytes Absolute 0.2  0.1 - 1.0 K/uL    Eosinophils Relative 2  0 - 5 %    Eosinophils Absolute 0.1  0.0 - 0.7 K/uL    Basophils Relative 0  0 - 1 %      Basophils Absolute 0.0  0.0 - 0.1 K/uL   BASIC METABOLIC PANEL     Status: Abnormal   Collection Time   08/18/12  6:17 PM      Component Value Range Comment   Sodium 135  135 - 145 mEq/L    Potassium 4.4  3.5 - 5.1 mEq/L    Chloride 99  96 - 112 mEq/L    CO2 22  19 - 32 mEq/L    Glucose, Bld 100 (*) 70 - 99 mg/dL    BUN 14  6 - 23 mg/dL    Creatinine, Ser 5.63  0.50 - 1.35 mg/dL    Calcium 9.6  8.4 - 87.5 mg/dL    GFR calc non Af Amer >90  >90 mL/min    GFR calc Af Amer >90  >90 mL/min   LIPASE, BLOOD     Status: Normal   Collection Time   08/18/12  6:17 PM      Component Value Range Comment   Lipase 15  11 - 59 U/L   HEPATIC FUNCTION PANEL     Status: Abnormal   Collection Time   08/18/12  6:17 PM      Component Value Range Comment   Total Protein 8.3  6.0 - 8.3 g/dL    Albumin 3.4 (*) 3.5 - 5.2 g/dL    AST 643 (*) 0 - 37 U/L    ALT 328 (*) 0 - 53 U/L    Alkaline Phosphatase 153 (*) 39 - 117 U/L    Total Bilirubin 0.4  0.3 - 1.2 mg/dL    Bilirubin, Direct 0.1  0.0 - 0.3 mg/dL    Indirect Bilirubin 0.3  0.3 - 0.9 mg/dL   URINALYSIS, ROUTINE W REFLEX MICROSCOPIC     Status: Abnormal   Collection Time   08/18/12  9:57 PM      Component Value Range Comment   Color, Urine AMBER (*) YELLOW BIOCHEMICALS MAY BE AFFECTED BY COLOR   APPearance CLEAR  CLEAR    Specific Gravity, Urine 1.036 (*) 1.005 - 1.030    pH 5.0  5.0 - 8.0    Glucose, UA NEGATIVE  NEGATIVE mg/dL    Hgb urine dipstick NEGATIVE  NEGATIVE    Bilirubin Urine SMALL (*) NEGATIVE    Ketones, ur TRACE (*) NEGATIVE mg/dL    Protein, ur NEGATIVE  NEGATIVE mg/dL  Urobilinogen, UA 1.0  0.0 - 1.0 mg/dL    Nitrite NEGATIVE  NEGATIVE    Leukocytes, UA NEGATIVE  NEGATIVE MICROSCOPIC NOT DONE ON URINES WITH NEGATIVE PROTEIN, BLOOD, LEUKOCYTES, NITRITE, OR GLUCOSE <1000 mg/dL.   Ct Abdomen Pelvis W Contrast  08/18/2012  *RADIOLOGY REPORT*  Clinical Data: 66 year old male abdominal pain.  History of pancreatic  carcinoma status post surgery  CT ABDOMEN AND PELVIS WITH CONTRAST  Technique:  Multidetector CT imaging of the abdomen and pelvis was performed following the standard protocol during bolus administration of intravenous contrast.  Contrast: OMNIPAQUE IOHEXOL 300 MG/ML  SOLN  Comparison: 07/25/2012 and earlier.  Findings: Half mildly improved ventilation at the right lung base. Suspect a combination of residual scarring and atelectasis. Superimposed increased bibasilar interstitial markings.  No lung base mass or effusion identified.  No pericardial effusion.  Calcified atherosclerosis.  No lower mediastinal lymphadenopathy.  No acute or suspicious osseous lesion identified.  No pelvic free fluid.  Stable fat containing inguinal hernias. Decompressed bladder.  Distal colon decompressed and within normal limits.  Left:  Within normal limits.  Mild motion artifact through the upper abdomen.  This may explain mild indistinctness of the transverse and right colon.  No definite colonic wall thickening.  Thickening and/or mucosal hyperenhancement of the distal ileum (series 2 image 88, 71) with mild surrounding mesenteric stranding. No dilated small bowel.  Oral contrast has not yet reached the abnormal loops.  Extensive postoperative changes to the upper abdomen. Biliary gas has resolved. Subtle heterogeneity in the inferior right hepatic lobe (series 2 image 31) corresponding to the area of early arterial enhancement described recently.  No other suspicious liver lesion.  .  Portal venous system within normal limits.  Infrarenal abdominal aortic aneurysm with prominent mural thrombus or plaque is stable measuring 29 mm diameter.  Major arterial structures in the abdomen and pelvis appear patent.  Spleen, adrenal glands and kidneys within normal limits. Postoperative changes to the porta hepatis and upper abdomen are stable.  Decreased lymph node adjacent to the portal vein and SMA. Irregularity of the duodenum but  no discrete duodenal mass. Gastrojejunostomy.  No abdominal free fluid.  No new or increased abdominal lymph nodes.  Postoperative changes to the ventral abdomen.  IMPRESSION: 1.  Inflamed distal small bowel compatible with nonspecific enteritis.  Favor infectious enteritis.  No associated bowel obstruction. 2.  Postoperative changes in the upper abdomen with no adverse features. 3. Mild heterogeneity of the right hepatic lobe again noted. Metastatic disease not excluded.  See CT staging report 07/25/2012. 4.  Other chronic findings including infrarenal abdominal aortic aneurysm, and lower lobe lung disease.   Original Report Authenticated By: Erskine Speed, M.D.     @ROS @ No weight gain or loss. No fever, wears reading glasses. No chest pain. No hepatitis. No gi bleed. No kidney stone. No CVA. No seizure disorder or psychiatric admission.  Blood pressure 110/80, pulse 90, temperature 98 F (36.7 C), temperature source Oral, resp. rate 18, SpO2 98.00%.  Constitutional: He is oriented to person, place, and time. He appears well-developed and averagely-nourished.  HENT:  Head: Normocephalic and atraumatic. Eyes: Manson Passey, EOM are normal. Pupils are equal, round, and reactive to light.  Neck: Normal range of motion. Neck supple.  Cardiovascular: Normal rate, normal heart sounds and intact distal pulses.  Pulmonary/Chest: Effort normal and breath sounds normal.  Abdominal: Bowel sounds are normal. There is tenderness in epigastric and RLQ area. There is no rebound and no  guarding.  Musculoskeletal: Normal range of motion. No edema and no tenderness.  Neurological: He is alert and oriented to person, place, and time. He has normal strength. No cranial nerve deficit or sensory deficit.  Skin: Skin is warm and dry. No rash noted.  Psychiatric: He has a normal mood and affect  Assessment/Plan Abdominal pain due to enteritis Papillary carcinoma(T3N1MX) Hypertension CAD S/P Whipple procedure S/P  Pancreatic stent placement  Admit IV antibiotic Home medication  Cristin Szatkowski S 08/19/2012, 12:08 AM

## 2012-08-19 NOTE — Progress Notes (Signed)
Subjective:  Little better. Afebrile.  Objective:  Vital Signs in the last 24 hours: Temp:  [98 F (36.7 C)-98.6 F (37 C)] 98.4 F (36.9 C) (12/31 1351) Pulse Rate:  [77-90] 79  (12/31 1351) Cardiac Rhythm:  [-]  Resp:  [16-18] 18  (12/31 1351) BP: (103-141)/(52-80) 138/57 mmHg (12/31 1351) SpO2:  [96 %-100 %] 97 % (12/31 1351) Weight:  [59.5 kg (131 lb 2.8 oz)] 59.5 kg (131 lb 2.8 oz) (12/31 0222)  Physical Exam: BP Readings from Last 1 Encounters:  08/19/12 138/57    Wt Readings from Last 1 Encounters:  08/19/12 59.5 kg (131 lb 2.8 oz)    Weight change:   HEENT: [place]/AT, Eyes-Brown, PERL, EOMI, Conjunctiva-Pale pink, Sclera-Non-icteric Neck: No JVD, No bruit, Trachea midline. Lungs:  Clear, Bilateral. Cardiac:  Regular rhythm, normal S1 and S2, no S3.  Abdomen:  Soft, non-tender on palpation. Extremities:  No edema present. No cyanosis. No clubbing. CNS: AxOx3, Cranial nerves grossly intact, moves all 4 extremities. Right handed. Skin: Warm and dry.   Intake/Output from previous day: 12/30 0701 - 12/31 0700 In: 166.7 [I.V.:166.7] Out: -     Lab Results: BMET    Component Value Date/Time   NA 132* 08/19/2012 0420   NA 135* 08/18/2012 1429   K 3.4* 08/19/2012 0420   K 4.2 08/18/2012 1429   CL 102 08/19/2012 0420   CL 102 08/18/2012 1429   CO2 21 08/19/2012 0420   CO2 21* 08/18/2012 1429   GLUCOSE 94 08/19/2012 0420   GLUCOSE 129* 08/18/2012 1429   BUN 9 08/19/2012 0420   BUN 13.0 08/18/2012 1429   CREATININE 0.59 08/19/2012 0420   CREATININE 0.9 08/18/2012 1429   CALCIUM 7.8* 08/19/2012 0420   CALCIUM 9.5 08/18/2012 1429   GFRNONAA >90 08/19/2012 0420   GFRAA >90 08/19/2012 0420   CBC    Component Value Date/Time   WBC 2.7* 08/19/2012 0420   WBC 4.2 08/18/2012 1429   RBC 3.89* 08/19/2012 0420   RBC 4.67 08/18/2012 1429   HGB 10.3* 08/19/2012 0420   HGB 13.0 08/18/2012 1429   HCT 30.5* 08/19/2012 0420   HCT 37.8* 08/18/2012 1429   PLT 293  08/19/2012 0420   PLT 310 08/18/2012 1429   MCV 78.4 08/19/2012 0420   MCV 80.9 08/18/2012 1429   MCH 26.5 08/19/2012 0420   MCH 27.9 08/18/2012 1429   MCHC 33.8 08/19/2012 0420   MCHC 34.5 08/18/2012 1429   RDW 15.4 08/19/2012 0420   RDW 16.4* 08/18/2012 1429   LYMPHSABS 1.6 08/18/2012 1817   LYMPHSABS 1.5 08/18/2012 1429   MONOABS 0.2 08/18/2012 1817   MONOABS 0.1 08/18/2012 1429   EOSABS 0.1 08/18/2012 1817   EOSABS 0.1 08/18/2012 1429   BASOSABS 0.0 08/18/2012 1817   BASOSABS 0.0 08/18/2012 1429   CARDIAC ENZYMES Lab Results  Component Value Date   TROPONINI <0.30 06/15/2012    Scheduled Meds:   . ciprofloxacin  400 mg Intravenous BID  . heparin  5,000 Units Subcutaneous Q8H  . lipase/protease/amylase  2 capsule Oral TID AC  . metroNIDAZOLE  500 mg Oral Q8H  . pantoprazole  40 mg Oral Daily  . polyethylene glycol  17 g Oral Daily  . potassium chloride  20 mEq Oral BID   Continuous Infusions:   . sodium chloride Stopped (08/19/12 0305)  . dextrose 5 % and 0.45% NaCl 50 mL/hr at 08/19/12 0306   PRN Meds:.HYDROmorphone (DILAUDID) injection, LORazepam, ondansetron, ondansetron, oxyCODONE-acetaminophen  Assessment/Plan:  Patient  Active Hospital Problem List:  Abdominal pain due to enteritis  Papillary carcinoma(T3N1MX)  Hypertension  CAD  S/P Whipple procedure  S/P Pancreatic stent placement  Continue antibiotic   LOS: 1 day    Orpah Cobb  MD  08/19/2012, 5:16 PM

## 2012-08-19 NOTE — ED Notes (Signed)
Called to give report nurse unavailable will call back.  

## 2012-08-19 NOTE — Care Management Note (Unsigned)
    Page 1 of 1   08/19/2012     4:15:17 PM   CARE MANAGEMENT NOTE 08/19/2012  Patient:  Colin Castro, Colin Castro   Account Number:  [id number]  Date Initiated:  08/19/2012  Documentation initiated by:  Lanier Clam  Subjective/Objective Assessment:   ADMITTED W/ABD PAIN.S/P WHIPPLE SX,APPENDECTOMY.HQ:IONGEXB CA INTO PANCREAS.     Action/Plan:   FROM HOME W/SPOUSE.HAS PCP,PHARMACY.   Anticipated DC Date:  08/25/2012   Anticipated DC Plan:  HOME/SELF CARE      DC Planning Services  CM consult      Choice offered to / List presented to:             Status of service:  In process, will continue to follow Medicare Important Message given?   (If response is "NO", the following Medicare IM given date fields will be blank) Date Medicare IM given:   Date Additional Medicare IM given:    Discharge Disposition:    Per UR Regulation:  Reviewed for med. necessity/level of care/duration of stay  If discussed at Long Length of Stay Meetings, dates discussed:    Comments:  08/19/12  [facility] RN,BSN  NCM [phone]

## 2012-08-19 NOTE — Progress Notes (Signed)
INITIAL NUTRITION ASSESSMENT  Pt meets criteria for severe MALNUTRITION in the context of chronic illness as evidenced by <75% estimated energy intake with 16% weight loss in the past 2 months.  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   Goal: Pt to consume >90% of meals.   Monitor:  Weights, labs, intake  Reason for Assessment: Nutrition risk   66 y.o. male  Admitting Dx: Abdominal pain  ASSESSMENT: Pt known to RD from admissions in September and October of this year during both times pt was on TPN. Pt has lost 25 pounds unintentionally in the past 2 months. Pt with stage III ampullary CA s/p chemoradiation, Whipple procedure, cholecystectomy, and pancreatic stent. Noted pt with poor intake for 3 days r/t abdominal pain. Pt reports before then he has been small meals during the day, typically including rice and milk at mealtimes. Pt currently eating excellent and states he only has a very little amount of abdominal pain today. Noted potassium slightly low, getting replaced.   Height: Ht Readings from Last 1 Encounters:  08/19/12 5\' 6"  (1.676 m)    Weight: Wt Readings from Last 1 Encounters:  08/19/12 131 lb 2.8 oz (59.5 kg)    Ideal Body Weight: 142 lb  % Ideal Body Weight: 92  Wt Readings from Last 20 Encounters:  08/19/12 131 lb 2.8 oz (59.5 kg)  08/18/12 130 lb 6.4 oz (59.149 kg)  08/14/12 130 lb 9.6 oz (59.24 kg)  08/06/12 132 lb 4.8 oz (60.011 kg)  07/30/12 132 lb 4.8 oz (60.011 kg)  07/24/12 128 lb 4.9 oz (58.2 kg)  07/24/12 128 lb 4.9 oz (58.2 kg)  07/23/12 134 lb 3.2 oz (60.873 kg)  07/11/12 133 lb (60.328 kg)  07/04/12 131 lb (59.421 kg)  06/18/12 147 lb 7.8  oz (66.9 kg)  06/18/12 147 lb 7.8 oz (66.9 kg)  06/03/12 146 lb 1.6 oz (66.271 kg)  06/02/12 145 lb 3.2 oz (65.862 kg)  05/23/12 156 lb 4.9 oz (70.9 kg)  05/23/12 156 lb 4.9 oz (70.9 kg)  05/23/12 156 lb 4.9 oz (70.9 kg)    Usual Body Weight: 155-156 lb  % Usual Body Weight: 84-85  BMI:  Body mass index is 21.17 kg/(m^2).  Estimated Nutritional Needs: Kcal: 1750-1950 Protein: 70-90g Fluid: > 1.9L/day   Diet Order: Cardiac  EDUCATION NEEDS: -No education needs identified at this time   Intake/Output Summary (Last 24 hours) at 08/19/12 1442 Last data filed at 08/19/12 1300  Gross per 24 hour  Intake 766.67 ml  Output    751 ml  Net  15.67 ml    Last BM: 12/30  Labs:   Lab 08/19/12 0420 08/18/12 1817 08/18/12 1429  NA 132* 135 135*  K 3.4* 4.4 4.2  CL 102 99 102  CO2 21 22 21*  BUN 9 14 13.0  CREATININE 0.59 0.74 0.9  CALCIUM 7.8* 9.6 9.5  MG -- -- --  PHOS -- -- --  GLUCOSE 94 100* 129*    CBG (last 3)  No results found for this basename: GLUCAP:3 in the last 72 hours  Scheduled Meds:   . ciprofloxacin  400 mg Intravenous BID  . heparin  5,000 Units Subcutaneous Q8H  .  lipase/protease/amylase  2 capsule Oral TID AC  . metroNIDAZOLE  500 mg Oral Q8H  . pantoprazole  40 mg Oral Daily  . polyethylene glycol  17 g Oral Daily  . potassium chloride  20 mEq Oral BID    Continuous Infusions:   . sodium chloride Stopped (08/19/12 0305)  . dextrose 5 % and 0.45% NaCl 50 mL/hr at 08/19/12 4098    Past Medical History  Diagnosis Date  . Hypertension   . High cholesterol   . Coronary artery disease   . Common bile duct (CBD) stricture     Malignant  . 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 Pneumonia   . Constipation   . Myocardial infarction     15 years ago    Past Surgical History  Procedure Date  . Ercp 05/07/2012    Procedure: ENDOSCOPIC RETROGRADE  CHOLANGIOPANCREATOGRAPHY (ERCP);  Surgeon: [name], MD;  Location: [facility];  Service: Endoscopy;  Laterality: N/A;  Dr [name]  . Eus 05/07/2012    Procedure: UPPER ENDOSCOPIC ULTRASOUND (EUS) RADIAL;  Surgeon: [name], MD;  Location: [facility];  Service: Endoscopy;;  . Appendectomy     open, 25 yrs ago  . Coronary stent placement     2 stents placed  . Whipple procedure 06/06/2012    Procedure: WHIPPLE PROCEDURE;  Surgeon: [name], MD;  Location: [facility] OR;  Service: General;  Laterality: N/A;  Pancreatico duodenectomy  . Cholecystectomy 06/06/2012    Procedure: CHOLECYSTECTOMY;  Surgeon: [name], MD;  Location: [facility] OR;  Service: General;  Laterality: N/A;  . Pancreatic stent placement 06/06/2012    Procedure: PANCREATIC STENT PLACEMENT;  Surgeon: [name], MD;  Location: [facility] OR;  Service: General;  Laterality: N/A;  Removal of biliary stent; Placement of pacreatic stent  . Lymph node dissection 06/06/2012    Procedure: LYMPH NODE DISSECTION;  Surgeon: [name], MD;  Location: [facility] OR;  Service: General;  Laterality: N/A;  Portal lymph node dissection  . Coronary angioplasty     8 or 9 years ago  . Portacath placement 07/24/2012    Procedure: INSERTION PORT-A-CATH;  Surgeon: [name], MD;  Location: [facility] OR;  Service: General;  Laterality: Left;     Levon Hedger MS, RD, LDN [phone] Pager [phone] After Hours Pager

## 2012-08-20 LAB — BASIC METABOLIC PANEL
CO2: 20 mEq/L (ref 19–32)
Calcium: 8.4 mg/dL (ref 8.4–10.5)
Creatinine, Ser: 0.65 mg/dL (ref 0.50–1.35)
Glucose, Bld: 111 mg/dL — ABNORMAL HIGH (ref 70–99)

## 2012-08-20 LAB — CBC
Hemoglobin: 10.8 g/dL — ABNORMAL LOW (ref 13.0–17.0)
MCH: 26.4 pg (ref 26.0–34.0)
MCV: 78.7 fL (ref 78.0–100.0)
RBC: 4.09 MIL/uL — ABNORMAL LOW (ref 4.22–5.81)

## 2012-08-20 MED ORDER — KCL IN DEXTROSE-NACL 10-5-0.45 MEQ/L-%-% IV SOLN
INTRAVENOUS | Status: DC
  Administered 2012-08-20 – 2012-08-23 (×3): via INTRAVENOUS
  Filled 2012-08-20 (×3): qty 1000

## 2012-08-20 NOTE — Progress Notes (Signed)
Subjective:  Feeling another 50 % better. Tolerated PO feedings. Afebrile. Low K +.  Objective:  Vital Signs in the last 24 hours: Temp:  [97.5 F (36.4 C)-98.4 F (36.9 C)] 98.3 F (36.8 C) (01/01 0650) Pulse Rate:  [70-79] 70  (01/01 0650) Cardiac Rhythm:  [-]  Resp:  [16-20] 16  (01/01 0650) BP: (82-138)/(52-62) 107/62 mmHg (01/01 0650) SpO2:  [97 %-98 %] 98 % (01/01 0650)  Physical Exam: BP Readings from Last 1 Encounters:  08/20/12 107/62    Wt Readings from Last 1 Encounters:  08/19/12 59.5 kg (131 lb 2.8 oz)    Weight change:   HEENT: [place]/AT, Eyes-Brown, PERL, EOMI, Conjunctiva-Pale pink, Sclera-Non-icteric Neck: No JVD, No bruit, Trachea midline. Lungs:  Clear, Bilateral. Cardiac:  Regular rhythm, normal S1 and S2, no S3.  Abdomen:  Soft, non-tender. + BS. Extremities:  No edema present. No cyanosis. No clubbing. CNS: AxOx3, Cranial nerves grossly intact, moves all 4 extremities. Right handed. Skin: Warm and dry.   Intake/Output from previous day: 12/31 0701 - 01/01 0700 In: 2603 [P.O.:840; I.V.:1763] Out: 751 [Urine:750; Stool:1]    Lab Results: BMET    Component Value Date/Time   NA 134* 08/20/2012 0520   NA 135* 08/18/2012 1429   K 3.3* 08/20/2012 0520   K 4.2 08/18/2012 1429   CL 104 08/20/2012 0520   CL 102 08/18/2012 1429   CO2 20 08/20/2012 0520   CO2 21* 08/18/2012 1429   GLUCOSE 111* 08/20/2012 0520   GLUCOSE 129* 08/18/2012 1429   BUN 5* 08/20/2012 0520   BUN 13.0 08/18/2012 1429   CREATININE 0.65 08/20/2012 0520   CREATININE 0.9 08/18/2012 1429   CALCIUM 8.4 08/20/2012 0520   CALCIUM 9.5 08/18/2012 1429   GFRNONAA >90 08/20/2012 0520   GFRAA >90 08/20/2012 0520   CBC    Component Value Date/Time   WBC 4.6 08/20/2012 0520   WBC 4.2 08/18/2012 1429   RBC 4.09* 08/20/2012 0520   RBC 4.67 08/18/2012 1429   HGB 10.8* 08/20/2012 0520   HGB 13.0 08/18/2012 1429   HCT 32.2* 08/20/2012 0520   HCT 37.8* 08/18/2012 1429   PLT 267 08/20/2012 0520   PLT 310 08/18/2012  1429   MCV 78.7 08/20/2012 0520   MCV 80.9 08/18/2012 1429   MCH 26.4 08/20/2012 0520   MCH 27.9 08/18/2012 1429   MCHC 33.5 08/20/2012 0520   MCHC 34.5 08/18/2012 1429   RDW 15.6* 08/20/2012 0520   RDW 16.4* 08/18/2012 1429   LYMPHSABS 1.6 08/18/2012 1817   LYMPHSABS 1.5 08/18/2012 1429   MONOABS 0.2 08/18/2012 1817   MONOABS 0.1 08/18/2012 1429   EOSABS 0.1 08/18/2012 1817   EOSABS 0.1 08/18/2012 1429   BASOSABS 0.0 08/18/2012 1817   BASOSABS 0.0 08/18/2012 1429   CARDIAC ENZYMES Lab Results  Component Value Date   TROPONINI <0.30 06/15/2012    Scheduled Meds:   . ciprofloxacin  400 mg Intravenous BID  . heparin  5,000 Units Subcutaneous Q8H  . lipase/protease/amylase  2 capsule Oral TID AC  . metroNIDAZOLE  500 mg Oral Q8H  . pantoprazole  40 mg Oral Daily  . polyethylene glycol  17 g Oral Daily  . potassium chloride  20 mEq Oral BID   Continuous Infusions:   . dextrose 5 % and 0.45 % NaCl with KCl 10 mEq/L     PRN Meds:.HYDROmorphone (DILAUDID) injection, LORazepam, ondansetron, ondansetron, oxyCODONE-acetaminophen  Assessment/Plan:  Patient Active Hospital Problem List:  Abdominal pain due to enteritis  Papillary carcinoma([id number])  Hypertension  CAD  S/P Whipple procedure  S/P Pancreatic stent placement   Increase activity as tolerated. Continue antibiotic    LOS: 2 days    Orpah Cobb  MD  08/20/2012, 12:43 PM

## 2012-08-21 ENCOUNTER — Ambulatory Visit: Payer: Medicare Other

## 2012-08-21 ENCOUNTER — Encounter (HOSPITAL_COMMUNITY): Payer: Self-pay | Admitting: General Surgery

## 2012-08-21 ENCOUNTER — Inpatient Hospital Stay (HOSPITAL_COMMUNITY): Payer: Medicare Other

## 2012-08-21 ENCOUNTER — Other Ambulatory Visit: Payer: Medicare Other | Admitting: Lab

## 2012-08-21 ENCOUNTER — Telehealth: Payer: Self-pay | Admitting: Medical Oncology

## 2012-08-21 DIAGNOSIS — K5289 Other specified noninfective gastroenteritis and colitis: Secondary | ICD-10-CM

## 2012-08-21 LAB — BASIC METABOLIC PANEL
BUN: 5 mg/dL — ABNORMAL LOW (ref 6–23)
CO2: 19 mEq/L (ref 19–32)
Calcium: 8.4 mg/dL (ref 8.4–10.5)
Creatinine, Ser: 0.72 mg/dL (ref 0.50–1.35)

## 2012-08-21 LAB — CBC
MCH: 26.7 pg (ref 26.0–34.0)
MCV: 76.9 fL — ABNORMAL LOW (ref 78.0–100.0)
Platelets: 254 10*3/uL (ref 150–400)
RBC: 4.24 MIL/uL (ref 4.22–5.81)

## 2012-08-21 MED ORDER — POTASSIUM CHLORIDE CRYS ER 20 MEQ PO TBCR
20.0000 meq | EXTENDED_RELEASE_TABLET | Freq: Every day | ORAL | Status: DC
  Administered 2012-08-21 – 2012-08-24 (×4): 20 meq via ORAL
  Filled 2012-08-21 (×5): qty 1

## 2012-08-21 MED ORDER — CIPROFLOXACIN HCL 500 MG PO TABS
500.0000 mg | ORAL_TABLET | Freq: Two times a day (BID) | ORAL | Status: DC
  Administered 2012-08-21 – 2012-08-26 (×10): 500 mg via ORAL
  Filled 2012-08-21 (×12): qty 1

## 2012-08-21 NOTE — Consult Note (Signed)
Colin Castro 01/06/1946  [id number].   Requesting MD: Dr. Ajay Kadakia Chief Complaint/Reason for Consult: small bowel enteritis HPI: This is a 66 yo male well known to Dr. Faera Byerly secondary to pancreatic cancer who underwent a Whipple procedure on 06-06-12.  He has since started undergoing chemotherapy. His most recent session was on 08-14-12.  He was admitted to [facility] on 08-18-12 due to abdominal pain with some nausea and vomiting.  He had a CT scan that revealed small bowel enteritis and was admitted.  He has been on Cipro and Flagyl and been NPO for 4 days.  Apparently last night around 3 am his pain worsened along with his nausea and vomiting.  He continues to have 5-6 BMs a day that are liquid.  This is his normal.  We have been asked to see the patient for further recommendations.  Review of Systems: Please see HPI, otherwise negative.  Family History  Problem Relation Age of Onset  . Ovarian cancer Mother     lived 25 years past diagnosis    Past Medical History  Diagnosis Date  . Hypertension   . High cholesterol   . Coronary artery disease   . Common bile duct (CBD) stricture     Malignant  . 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 Pneumonia   . Constipation   . Myocardial infarction     15 years ago    Past Surgical History  Procedure Date  . Ercp 05/07/2012    Procedure: ENDOSCOPIC RETROGRADE CHOLANGIOPANCREATOGRAPHY (ERCP);  Surgeon: Salem F Ganem, MD;  Location: [facility];  Service: Endoscopy;  Laterality: N/A;  Dr Ganem/ebp  . Eus 05/07/2012    Procedure: UPPER ENDOSCOPIC ULTRASOUND (EUS) RADIAL;  Surgeon: Salem F Ganem, MD;  Location: [facility];  Service: Endoscopy;;  . Appendectomy     open, 25 yrs ago  . Coronary stent placement     2 stents placed  . Whipple procedure 06/06/2012    Procedure: WHIPPLE PROCEDURE;  Surgeon: Faera Byerly, MD;  Location: [facility] OR;  Service: General;   Laterality: N/A;  Pancreatico duodenectomy  . Cholecystectomy 06/06/2012    Procedure: CHOLECYSTECTOMY;  Surgeon: Faera Byerly, MD;  Location: [facility] OR;  Service: General;  Laterality: N/A;  . Pancreatic stent placement 06/06/2012    Procedure: PANCREATIC STENT PLACEMENT;  Surgeon: Faera Byerly, MD;  Location: [facility] OR;  Service: General;  Laterality: N/A;  Removal of biliary stent; Placement of pacreatic stent  . Lymph node dissection 06/06/2012    Procedure: LYMPH NODE DISSECTION;  Surgeon: Faera Byerly, MD;  Location: [facility] OR;  Service: General;  Laterality: N/A;  Portal lymph node dissection  . Coronary angioplasty     8 or 9 years ago  . Portacath placement 07/24/2012    Procedure: INSERTION PORT-A-CATH;  Surgeon: Faera Byerly, MD;  Location: [facility] OR;  Service: General;  Laterality: Left;    Social History:  reports that he has been smoking Cigarettes.  He has a 9.6 pack-year smoking history. He has never used smokeless tobacco. He reports that he does not drink alcohol or use illicit drugs.  Allergies: No Known Allergies  Medications Prior to Admission  Medication Sig Dispense Refill  . acetaminophen (TYLENOL) 500 MG tablet Take 500 mg by mouth every 6 (six) hours as needed. For pain      . hydrOXYzine (ATARAX/VISTARIL) 10 MG tablet Take 10 mg by mouth 2 (two) times daily as   needed. For itching      . lipase/protease/amylase (CREON-10/PANCREASE) 12000 UNITS CPEP Take 2 capsules by mouth 3 (three) times daily before meals.  270 capsule  2  . LORazepam (ATIVAN) 0.5 MG tablet Take 1 tablet (0.5 mg total) by mouth every 6 (six) hours as needed (Nausea or vomiting).  30 tablet  0  . megestrol (MEGACE) 400 MG/10ML suspension Take 400 mg by mouth 2 (two) times daily.       . Multiple Vitamin (MULTIVITAMIN WITH MINERALS) TABS Take 1 tablet by mouth daily.      . ondansetron (ZOFRAN) 4 MG tablet Take 4 mg by mouth every 6 (six) hours as needed. For nausea      . ondansetron (ZOFRAN) 8 MG tablet Take 1  tablet (8 mg total) by mouth 2 (two) times daily as needed (Nausea or vomiting).  30 tablet  1  . oxyCODONE-acetaminophen (PERCOCET/ROXICET) 5-325 MG per tablet Take 1-2 tablets by mouth every 4 (four) hours as needed. For pain  30 tablet  0  . pantoprazole (PROTONIX) 40 MG tablet Take 40 mg by mouth daily.      . polyethylene glycol powder (MIRALAX) powder Take 17 g by mouth daily.  255 g  4  . senna-docusate (SENNA S) 8.6-50 MG per tablet Take 1 tablet by mouth daily.  30 tablet  4    Blood pressure 137/63, pulse 101, temperature 98.3 F (36.8 C), temperature source Oral, resp. rate 22, height 5' 6" (1.676 m), weight 134 lb 12.8 oz (61.145 kg), SpO2 95.00%. Physical Exam: General: pleasant, WD, WN white male who is laying in bed in NAD HEENT: head is normocephalic, atraumatic.  Sclera are noninjected.  PERRL.  Ears and nose without any masses or lesions.  Mouth is pink and moist Heart: regular, rate, and rhythm.  Normal s1,s2. No obvious murmurs, gallops, or rubs noted.  Palpable radial and pedal pulses bilaterally Lungs: CTAB, no wheezes, rhonchi, or rales noted.  Respiratory effort nonlabored Abd: soft, mildly tender in the RLQ, ND, +BS, no masses, hernias, or organomegaly, multiple scars noted from whipple procedure and prior surgical procedures MS: all 4 extremities are symmetrical with no cyanosis, clubbing, or edema. Skin: warm and dry with no masses, lesions, or rashes Psych: A&Ox3 with an appropriate affect.    Results for orders placed during the hospital encounter of 08/18/12 (from the past 48 hour(s))  CBC     Status: Abnormal   Collection Time   08/20/12  5:20 AM      Component Value Range Comment   WBC 4.6  4.0 - 10.5 K/uL    RBC 4.09 (*) 4.22 - 5.81 MIL/uL    Hemoglobin 10.8 (*) 13.0 - 17.0 g/dL    HCT 32.2 (*) 39.0 - 52.0 %    MCV 78.7  78.0 - 100.0 fL    MCH 26.4  26.0 - 34.0 pg    MCHC 33.5  30.0 - 36.0 g/dL    RDW 15.6 (*) 11.5 - 15.5 %    Platelets 267  150 - 400  K/uL   BASIC METABOLIC PANEL     Status: Abnormal   Collection Time   08/20/12  5:20 AM      Component Value Range Comment   Sodium 134 (*) 135 - 145 mEq/L    Potassium 3.3 (*) 3.5 - 5.1 mEq/L    Chloride 104  96 - 112 mEq/L    CO2 20  19 - 32 mEq/L      Glucose, Bld 111 (*) 70 - 99 mg/dL    BUN 5 (*) 6 - 23 mg/dL    Creatinine, Ser 0.65  0.50 - 1.35 mg/dL    Calcium 8.4  8.4 - 10.5 mg/dL    GFR calc non Af Amer >90  >90 mL/min    GFR calc Af Amer >90  >90 mL/min   CBC     Status: Abnormal   Collection Time   08/21/12  6:00 AM      Component Value Range Comment   WBC 5.4  4.0 - 10.5 K/uL    RBC 4.24  4.22 - 5.81 MIL/uL    Hemoglobin 11.3 (*) 13.0 - 17.0 g/dL    HCT 32.6 (*) 39.0 - 52.0 %    MCV 76.9 (*) 78.0 - 100.0 fL    MCH 26.7  26.0 - 34.0 pg    MCHC 34.7  30.0 - 36.0 g/dL    RDW 15.4  11.5 - 15.5 %    Platelets 254  150 - 400 K/uL   BASIC METABOLIC PANEL     Status: Abnormal   Collection Time   08/21/12  6:00 AM      Component Value Range Comment   Sodium 130 (*) 135 - 145 mEq/L    Potassium 3.8  3.5 - 5.1 mEq/L    Chloride 101  96 - 112 mEq/L    CO2 19  19 - 32 mEq/L    Glucose, Bld 105 (*) 70 - 99 mg/dL    BUN 5 (*) 6 - 23 mg/dL    Creatinine, Ser 0.72  0.50 - 1.35 mg/dL    Calcium 8.4  8.4 - 10.5 mg/dL    GFR calc non Af Amer >90  >90 mL/min    GFR calc Af Amer >90  >90 mL/min    No results found.     Assessment/Plan 1. Small bowel enteritis 2. Pancreatic cancer, s/p whipple currently getting chemotherapy  Plan: 1. I have spoken to Dr. Byerly.  We agree with bowel rest and abx therapy.  Given he has been here for 4 days and is on chemo, she would like to go ahead and start him on TNA.  She will see the patient tomorrow morning.  Right now there are no surgical indications.  We will follow along.  Meghanne Pletz E 08/21/2012, 4:06 PM Pager: [phone]  

## 2012-08-21 NOTE — Consult Note (Deleted)
Colin Castro 05-28-1946  [id number].   Requesting MD: Dr. Orpah Cobb Chief Complaint/Reason for Consult: small bowel enteritis HPI: This is a 67 yo male well known to Dr. [name] secondary to pancreatic cancer who underwent a Whipple procedure on 06-06-12.  He has since started undergoing chemotherapy. His most recent session was on 08-14-12.  He was admitted to [facility] on 08-18-12 due to abdominal pain with some nausea and vomiting.  He had a CT scan that revealed small bowel enteritis and was admitted.  He has been on Cipro and Flagyl and been NPO for 4 days.  Apparently last night around 3 am his pain worsened along with his nausea and vomiting.  He continues to have 5-6 BMs a day that are liquid.  This is his normal.  We have been asked to see the patient for further recommendations.  Review of Systems: Please see HPI, otherwise negative.  Family History  Problem Relation Age of Onset  . Ovarian cancer Mother     lived 25 years past diagnosis    Past Medical History  Diagnosis Date  . Hypertension   . High cholesterol   . Coronary artery disease   . Common bile duct (CBD) stricture     Malignant  . 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 Pneumonia   . Constipation   . Myocardial infarction     15 years ago    Past Surgical History  Procedure Date  . Ercp 05/07/2012    Procedure: ENDOSCOPIC RETROGRADE CHOLANGIOPANCREATOGRAPHY (ERCP);  Surgeon: [name], MD;  Location: [facility];  Service: Endoscopy;  Laterality: N/A;  Dr [name]  . Eus 05/07/2012    Procedure: UPPER ENDOSCOPIC ULTRASOUND (EUS) RADIAL;  Surgeon: [name], MD;  Location: [facility];  Service: Endoscopy;;  . Appendectomy     open, 25 yrs ago  . Coronary stent placement     2 stents placed  . Whipple procedure 06/06/2012    Procedure: WHIPPLE PROCEDURE;  Surgeon: [name], MD;  Location: [facility] OR;  Service: General;   Laterality: N/A;  Pancreatico duodenectomy  . Cholecystectomy 06/06/2012    Procedure: CHOLECYSTECTOMY;  Surgeon: [name], MD;  Location: [facility];  Service: General;  Laterality: N/A;  . Pancreatic stent placement 06/06/2012    Procedure: PANCREATIC STENT PLACEMENT;  Surgeon: [name], MD;  Location: [facility] OR;  Service: General;  Laterality: N/A;  Removal of biliary stent; Placement of pacreatic stent  . Lymph node dissection 06/06/2012    Procedure: LYMPH NODE DISSECTION;  Surgeon: [name], MD;  Location: [facility] OR;  Service: General;  Laterality: N/A;  Portal lymph node dissection  . Coronary angioplasty     8 or 9 years ago  . Portacath placement 07/24/2012    Procedure: INSERTION PORT-A-CATH;  Surgeon: [name], MD;  Location: [facility] OR;  Service: General;  Laterality: Left;    Social History:  reports that he has been smoking Cigarettes.  He has a 9.6 pack-year smoking history. He has never used smokeless tobacco. He reports that he does not drink alcohol or use illicit drugs.  Allergies: No Known Allergies  Medications Prior to Admission  Medication Sig Dispense Refill  . acetaminophen (TYLENOL) 500 MG tablet Take 500 mg by mouth every 6 (six) hours as needed. For pain      . hydrOXYzine (ATARAX/VISTARIL) 10 MG tablet Take 10 mg by mouth 2 (two) times daily as  needed. For itching      . lipase/protease/amylase (CREON-10/PANCREASE) 12000 UNITS CPEP Take 2 capsules by mouth 3 (three) times daily before meals.  270 capsule  2  . LORazepam (ATIVAN) 0.5 MG tablet Take 1 tablet (0.5 mg total) by mouth every 6 (six) hours as needed (Nausea or vomiting).  30 tablet  0  . megestrol (MEGACE) 400 MG/10ML suspension Take 400 mg by mouth 2 (two) times daily.       . Multiple Vitamin (MULTIVITAMIN WITH MINERALS) TABS Take 1 tablet by mouth daily.      . ondansetron (ZOFRAN) 4 MG tablet Take 4 mg by mouth every 6 (six) hours as needed. For nausea      . ondansetron (ZOFRAN) 8 MG tablet Take 1  tablet (8 mg total) by mouth 2 (two) times daily as needed (Nausea or vomiting).  30 tablet  1  . oxyCODONE-acetaminophen (PERCOCET/ROXICET) 5-325 MG per tablet Take 1-2 tablets by mouth every 4 (four) hours as needed. For pain  30 tablet  0  . pantoprazole (PROTONIX) 40 MG tablet Take 40 mg by mouth daily.      . polyethylene glycol powder (MIRALAX) powder Take 17 g by mouth daily.  255 g  4  . senna-docusate (SENNA S) 8.6-50 MG per tablet Take 1 tablet by mouth daily.  30 tablet  4    Blood pressure 137/63, pulse 101, temperature 98.3 F (36.8 C), temperature source Oral, resp. rate 22, height 5\' 6"  (1.676 m), weight 134 lb 12.8 oz (61.145 kg), SpO2 95.00%. Physical Exam: General: pleasant, WD, WN white male who is laying in bed in NAD HEENT: head is normocephalic, atraumatic.  Sclera are noninjected.  PERRL.  Ears and nose without any masses or lesions.  Mouth is pink and moist Heart: regular, rate, and rhythm.  Normal s1,s2. No obvious murmurs, gallops, or rubs noted.  Palpable radial and pedal pulses bilaterally Lungs: CTAB, no wheezes, rhonchi, or rales noted.  Respiratory effort nonlabored Abd: soft, mildly tender in the RLQ, ND, +BS, no masses, hernias, or organomegaly, multiple scars noted from whipple procedure and prior surgical procedures MS: all 4 extremities are symmetrical with no cyanosis, clubbing, or edema. Skin: warm and dry with no masses, lesions, or rashes Psych: A&Ox3 with an appropriate affect.    Results for orders placed during the hospital encounter of 08/18/12 (from the past 48 hour(s))  CBC     Status: Abnormal   Collection Time   08/20/12  5:20 AM      Component Value Range Comment   WBC 4.6  4.0 - 10.5 K/uL    RBC 4.09 (*) 4.22 - 5.81 MIL/uL    Hemoglobin 10.8 (*) 13.0 - 17.0 g/dL    HCT 78.2 (*) 95.6 - 52.0 %    MCV 78.7  78.0 - 100.0 fL    MCH 26.4  26.0 - 34.0 pg    MCHC 33.5  30.0 - 36.0 g/dL    RDW 21.3 (*) 08.6 - 15.5 %    Platelets 267  150 - 400  K/uL   BASIC METABOLIC PANEL     Status: Abnormal   Collection Time   08/20/12  5:20 AM      Component Value Range Comment   Sodium 134 (*) 135 - 145 mEq/L    Potassium 3.3 (*) 3.5 - 5.1 mEq/L    Chloride 104  96 - 112 mEq/L    CO2 20  19 - 32 mEq/L  Glucose, Bld 111 (*) 70 - 99 mg/dL    BUN 5 (*) 6 - 23 mg/dL    Creatinine, Ser 1.61  0.50 - 1.35 mg/dL    Calcium 8.4  8.4 - 09.6 mg/dL    GFR calc non Af Amer >90  >90 mL/min    GFR calc Af Amer >90  >90 mL/min   CBC     Status: Abnormal   Collection Time   08/21/12  6:00 AM      Component Value Range Comment   WBC 5.4  4.0 - 10.5 K/uL    RBC 4.24  4.22 - 5.81 MIL/uL    Hemoglobin 11.3 (*) 13.0 - 17.0 g/dL    HCT 04.5 (*) 40.9 - 52.0 %    MCV 76.9 (*) 78.0 - 100.0 fL    MCH 26.7  26.0 - 34.0 pg    MCHC 34.7  30.0 - 36.0 g/dL    RDW 81.1  91.4 - 78.2 %    Platelets 254  150 - 400 K/uL   BASIC METABOLIC PANEL     Status: Abnormal   Collection Time   08/21/12  6:00 AM      Component Value Range Comment   Sodium 130 (*) 135 - 145 mEq/L    Potassium 3.8  3.5 - 5.1 mEq/L    Chloride 101  96 - 112 mEq/L    CO2 19  19 - 32 mEq/L    Glucose, Bld 105 (*) 70 - 99 mg/dL    BUN 5 (*) 6 - 23 mg/dL    Creatinine, Ser 9.56  0.50 - 1.35 mg/dL    Calcium 8.4  8.4 - 21.3 mg/dL    GFR calc non Af Amer >90  >90 mL/min    GFR calc Af Amer >90  >90 mL/min    No results found.     Assessment/Plan 1. Small bowel enteritis 2. Pancreatic cancer, s/p whipple currently getting chemotherapy  Plan: 1. I have spoken to Dr. Donell Beers.  We agree with bowel rest and abx therapy.  Given he has been here for 4 days and is on chemo, she would like to go ahead and start him on TNA.  She will see the patient tomorrow morning.  Right now there are no surgical indications.  We will follow along.  OSBORNE,KELLY E 08/21/2012, 4:06 PM Pager: [phone]

## 2012-08-21 NOTE — Consult Note (Signed)
[facility] Consult Note  Referring Provider: No ref. provider found Primary Care Physician:  [name], MD Primary Gastroenterologist:  Dr.  Antony Contras Complaint: Abdominal pain nausea and vomiting and some diarrhea HPI: Theordore Castro is an 67 y.o. 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 He states that for the last 10-15 days he's been having both left upper quadrant and right lower quadrant abdominal pain as well as nausea and vomiting in variable degrees of diarrhea. He presented to the emergency room and a CT scan which showed some distal small bowel enteritis. He was started on Cipro and Flagyl and so far is not noting any improvement although the diarrhea is somewhat better.  Past Medical History  Diagnosis Date  . Hypertension   . High cholesterol   . Coronary artery disease   . Common bile duct (CBD) stricture     Malignant  . 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 Pneumonia   . Constipation   . Myocardial infarction     15 years ago    Past Surgical History  Procedure Date  . Ercp 05/07/2012    Procedure: ENDOSCOPIC RETROGRADE CHOLANGIOPANCREATOGRAPHY (ERCP);  Surgeon: [name], MD;  Location: [facility];  Service: Endoscopy;  Laterality: N/A;  Dr [name]  . Eus 05/07/2012    Procedure: UPPER ENDOSCOPIC ULTRASOUND (EUS) RADIAL;  Surgeon: [name], MD;  Location: [facility];  Service: Endoscopy;;  . Appendectomy     open, 25 yrs ago  . Coronary stent placement     2 stents placed  . Whipple procedure 06/06/2012    Procedure: WHIPPLE PROCEDURE;  Surgeon: [name], MD;  Location: [facility] OR;  Service: General;  Laterality: N/A;  Pancreatico duodenectomy  . Cholecystectomy 06/06/2012    Procedure: CHOLECYSTECTOMY;  Surgeon: [name], MD;  Location: [facility] OR;  Service: General;  Laterality: N/A;  . Pancreatic stent  placement 06/06/2012    Procedure: PANCREATIC STENT PLACEMENT;  Surgeon: [name], MD;  Location: [facility] OR;  Service: General;  Laterality: N/A;  Removal of biliary stent; Placement of pacreatic stent  . Lymph node dissection 06/06/2012    Procedure: LYMPH NODE DISSECTION;  Surgeon: [name], MD;  Location: [facility] OR;  Service: General;  Laterality: N/A;  Portal lymph node dissection  . Coronary angioplasty     8 or 9 years ago  . Portacath placement 07/24/2012    Procedure: INSERTION PORT-A-CATH;  Surgeon: [name], MD;  Location: [facility] OR;  Service: General;  Laterality: Left;    Medications Prior to Admission  Medication Sig Dispense Refill  . acetaminophen (TYLENOL) 500 MG tablet Take 500 mg by mouth every 6 (six) hours as needed. For pain      . hydrOXYzine (ATARAX/VISTARIL) 10 MG tablet Take 10 mg by mouth 2 (two) times daily as needed. For itching      . lipase/protease/amylase (CREON-10/PANCREASE) 12000 UNITS CPEP Take 2 capsules by mouth 3 (three) times daily before meals.  270 capsule  2  . LORazepam (ATIVAN) 0.5 MG tablet Take 1 tablet (0.5 mg total) by mouth every 6 (six) hours as needed (Nausea or vomiting).  30 tablet  0  . megestrol (MEGACE) 400 MG/10ML suspension Take 400 mg by mouth 2 (two) times daily.       . Multiple Vitamin (MULTIVITAMIN WITH MINERALS) TABS Take 1 tablet by mouth daily.      [name]  ondansetron (ZOFRAN) 4 MG tablet Take 4 mg by mouth every 6 (six) hours as needed. For nausea      . ondansetron (ZOFRAN) 8 MG tablet Take 1 tablet (8 mg total) by mouth 2 (two) times daily as needed (Nausea or vomiting).  30 tablet  1  . oxyCODONE-acetaminophen (PERCOCET/ROXICET) 5-325 MG per tablet Take 1-2 tablets by mouth every 4 (four) hours as needed. For pain  30 tablet  0  . pantoprazole (PROTONIX) 40 MG tablet Take 40 mg by mouth daily.      . polyethylene glycol powder (MIRALAX) powder Take 17 g by mouth daily.  255 g  4  . senna-docusate (SENNA S) 8.6-50 MG per tablet  Take 1 tablet by mouth daily.  30 tablet  4    Allergies: No Known Allergies  Family History  Problem Relation Age of Onset  . Ovarian cancer Mother     lived 25 years past diagnosis    Social History:  reports that he has been smoking Cigarettes.  He has a 9.6 pack-year smoking history. He has never used smokeless tobacco. He reports that he does not drink alcohol or use illicit drugs.  Review of Systems: negative except as above   Blood pressure 137/63, pulse 101, temperature 98.3 F (36.8 C), temperature source Oral, resp. rate 22, height 5\' 6"  (1.676 m), weight 61.145 kg (134 lb 12.8 oz), SpO2 95.00%. Head: Normocephalic, without obvious abnormality, atraumatic Neck: no adenopathy, no carotid bruit, no JVD, supple, symmetrical, trachea midline and thyroid not enlarged, symmetric, no tenderness/mass/nodules Resp: clear to auscultation bilaterally Cardio: regular rate and rhythm, S1, S2 normal, no murmur, click, rub or gallop GI: Multiple surgical scars well healed from Whipple procedure. There is some right lower quadrant tenderness. Extremities: extremities normal, atraumatic, no cyanosis or edema  Results for orders placed during the hospital encounter of 08/18/12 (from the past 48 hour(s))  CBC     Status: Abnormal   Collection Time   08/20/12  5:20 AM      Component Value Range Comment   WBC 4.6  4.0 - 10.5 K/uL    RBC 4.09 (*) 4.22 - 5.81 MIL/uL    Hemoglobin 10.8 (*) 13.0 - 17.0 g/dL    HCT 16.1 (*) 09.6 - 52.0 %    MCV 78.7  78.0 - 100.0 fL    MCH 26.4  26.0 - 34.0 pg    MCHC 33.5  30.0 - 36.0 g/dL    RDW 04.5 (*) 40.9 - 15.5 %    Platelets 267  150 - 400 K/uL   BASIC METABOLIC PANEL     Status: Abnormal   Collection Time   08/20/12  5:20 AM      Component Value Range Comment   Sodium 134 (*) 135 - 145 mEq/L    Potassium 3.3 (*) 3.5 - 5.1 mEq/L    Chloride 104  96 - 112 mEq/L    CO2 20  19 - 32 mEq/L    Glucose, Bld 111 (*) 70 - 99 mg/dL    BUN 5 (*) 6 - 23 mg/dL     Creatinine, Ser 8.11  0.50 - 1.35 mg/dL    Calcium 8.4  8.4 - 91.4 mg/dL    GFR calc non Af Amer >90  >90 mL/min    GFR calc Af Amer >90  >90 mL/min   CBC     Status: Abnormal   Collection Time   08/21/12  6:00 AM  Component Value Range Comment   WBC 5.4  4.0 - 10.5 K/uL    RBC 4.24  4.22 - 5.81 MIL/uL    Hemoglobin 11.3 (*) 13.0 - 17.0 g/dL    HCT 04.5 (*) 40.9 - 52.0 %    MCV 76.9 (*) 78.0 - 100.0 fL    MCH 26.7  26.0 - 34.0 pg    MCHC 34.7  30.0 - 36.0 g/dL    RDW 81.1  91.4 - 78.2 %    Platelets 254  150 - 400 K/uL   BASIC METABOLIC PANEL     Status: Abnormal   Collection Time   08/21/12  6:00 AM      Component Value Range Comment   Sodium 130 (*) 135 - 145 mEq/L    Potassium 3.8  3.5 - 5.1 mEq/L    Chloride 101  96 - 112 mEq/L    CO2 19  19 - 32 mEq/L    Glucose, Bld 105 (*) 70 - 99 mg/dL    BUN 5 (*) 6 - 23 mg/dL    Creatinine, Ser 9.56  0.50 - 1.35 mg/dL    Calcium 8.4  8.4 - 21.3 mg/dL    GFR calc non Af Amer >90  >90 mL/min    GFR calc Af Amer >90  >90 mL/min    No results found.  Assessment: Patient status post Whipple procedure with nausea vomiting abdominal pain and diarrhea with evidence of terminal ileitis on CT scan no significant improvement on Cipro and Flagyl. Plan:  Agree with TPN as planned by surgery and await 2 view abdomen to see if there is any evidence of obstruction. The terminal ileum might need to be reimaged at some point. We'll go ahead and obtain GI pathogens panel and Hemoccults as well. Laketia Vicknair C 08/21/2012, 4:33 PM

## 2012-08-21 NOTE — Progress Notes (Signed)
PARENTERAL NUTRITION CONSULT NOTE - INITIAL  Pharmacy Consult for TNA Indication:  Bowel Rest for Small Bowel Enteritis   No Known Allergies  Patient Measurements: Height: 5\' 6"  (167.6 cm) Weight: 134 lb 12.8 oz (61.145 kg) IBW/kg (Calculated) : 63.8  Usual Weight: 155-156 lb  Vital Signs: Temp: 98.3 F (36.8 C) (01/02 1400) Temp src: Oral (01/02 1400) BP: 137/63 mmHg (01/02 1400) Pulse Rate: 101  (01/02 1400) Intake/Output from previous day: 01/01 0701 - 01/02 0700 In: 1655 [P.O.:480; I.V.:1175] Out: -  Intake/Output from this shift: Total I/O In: -  Out: 302 [Urine:300; Stool:2]  Labs:  [facility] 08/21/12 0600 08/20/12 0520 08/19/12 0420  WBC 5.4 4.6 2.7*  HGB 11.3* 10.8* 10.3*  HCT 32.6* 32.2* 30.5*  PLT 254 267 293  APTT -- -- --  INR -- -- --     Basename 08/21/12 0600 08/20/12 0520 08/19/12 0420  NA 130* 134* 132*  K 3.8 3.3* 3.4*  CL 101 104 102  CO2 19 20 21   GLUCOSE 105* 111* 94  BUN 5* 5* 9  CREATININE 0.72 0.65 0.59  LABCREA -- -- --  CREAT24HRUR -- -- --  CALCIUM 8.4 8.4 7.8*  MG -- -- --  PHOS -- -- --  PROT -- -- 5.8*  ALBUMIN -- -- 2.3*  AST -- -- 86*  ALT -- -- 204*  ALKPHOS -- -- 107  BILITOT -- -- 0.3  BILIDIR -- -- --  IBILI -- -- --  PREALBUMIN -- -- --  TRIG -- -- --  CHOLHDL -- -- --  CHOL -- -- --   Estimated Creatinine Clearance: 79.6 ml/min (by C-G formula based on Cr of 0.72).   No results found for this basename: GLUCAP:3 in the last 72 hours  Medical History: Past Medical History  Diagnosis Date  . Hypertension   . High cholesterol   . Coronary artery disease   . Common bile duct (CBD) stricture     Malignant  . 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 Pneumonia   . Constipation   . Myocardial infarction     15 years ago    Medications:  Scheduled:    . ciprofloxacin  500 mg Oral BID  . heparin  5,000 Units Subcutaneous Q8H  .  lipase/protease/amylase  2 capsule Oral TID AC  . metroNIDAZOLE  500 mg Oral Q8H  . pantoprazole  40 mg Oral Daily  . polyethylene glycol  17 g Oral Daily  . potassium chloride  20 mEq Oral Daily  . [DISCONTINUED] ciprofloxacin  400 mg Intravenous BID  . [DISCONTINUED] potassium chloride  20 mEq Oral BID   Infusions:    . dextrose 5 % and 0.45 % NaCl with KCl 10 mEq/L 30 mL/hr at 08/20/12 1334    Insulin Requirements in the past 24 hours:  None ordered  Nutritional Goals:  Per 12/31 RD note: Kcal: 1750-1950,  Protein: 70-90g,  Fluid: > 1.9L/day  Goal TNA: Clinimix E 5/20 @ 25ml/hr + 20% IV lipids on MWF will provide 96 g protein/day and 1896 kcal/day avg (1689 kcal on non-lipid days, 2169 kcal on lipid days)  Current Nutrition:  NPO  Assessment: 67 yo M with pancreatic cancer admitted 08/18/12 with CC: abdominal pain. Pt is s/p Whipple on 06/06/12 and s/p chemo on 08/14/12. Prior to admission, patient reported 10-15 days of abdominal pain, diarrhea, nausea, and vomiting. Per 08/22/11 Surgery note, patient has  been NPO for 4 days (although PO intake recorded on 12/31 and 480 ml PO recorded on 1/1). Pt reportedly has not improved despite antibiotics. Plan now is for bowel rest for small bowel enteritis and start TNA. Per RD note, patient is classified as severely malnourished with ~16% weight loss (25 lbs) in the past 2 months and poor PO intake ~ 3 days prior to admission. Pt has an implanted Port placed 07/24/12 on L chest (as reported by RN). TNA will be started tomorrow (1/3) at 18:00 due to order being placed after consult cut-off time.  Labs: Lytes: Na is low (will be unable to change Na+ content of TNA). Other lytes wnl. Renal: wnl LFTs: AST/ALT elevated but trending down - monitor CBGs: none ordered at this time, no hx of DM noted. CBGs in Nov 2013 <150. IVF: D5-1/2NS+10K @ 27ml/hr GI PPx: Protonix 40mg  PO daily  Plan:  1) TNA will NOT begin until tomorrow (1/3) at 18:00  due to order being placed after consult cut-off time. 2) Will order labs for assessment tomorrow morning: CMET, Mg, Phos x1 in am 3) RD f/u consult 4) Exact TNA orders to be placed tomorrow at the discretion of 1st shift clinical pharmacist.  Darrol Angel, PharmD Pager: [phone] 08/21/2012,6:37 PM

## 2012-08-21 NOTE — Progress Notes (Signed)
The patient is receiving Ciprofloxacin by the intravenous route.  Based on criteria approved by the Pharmacy and Therapeutics Committee and the Medical Executive Committee, the medication is being converted to the equivalent oral dose form.  These criteria include: -No Active GI bleeding -Able to tolerate diet of full liquids (or better) or tube feeding -Able to tolerate other medications by the oral or enteral route  If you have any questions about this conversion, please contact the Pharmacy Department (phone [phone]).  Thank you.  BorgerdingLoma Messing PharmD Pager #: [phone] 2:23 PM 08/21/2012

## 2012-08-21 NOTE — Telephone Encounter (Signed)
Spoke with patients interpretor, Daryl Eastern, regarding patient has been in [facility] hospital room [id number] for two days and asking if Dr. Welton Flakes is aware that pt has been admitted. Interpretor states patient and family concerned that he continues to have abdominal pain, vomitting, diarrhea and is not eating and Dr. Fredric Mare has not seen patient, Dr Algie Coffer did see patient yesterday morning. Family requesting to speak to Dr. Welton Flakes. Patient was admitted 08/18/12. Will forward mssg to MD.

## 2012-08-21 NOTE — Progress Notes (Signed)
Subjective:  Had nausea early this morning with questionable vomiting. Patient improves with Zofran and resumes diet. Son wants GI, Oncology and Surgeon consulted. T max 99.6  Objective:  Vital Signs in the last 24 hours: Temp:  [98.1 F (36.7 C)-99.6 F (37.6 C)] 98.3 F (36.8 C) (01/02 1400) Pulse Rate:  [83-101] 101  (01/02 1400) Cardiac Rhythm:  [-]  Resp:  [16-22] 22  (01/02 1400) BP: (94-139)/(62-66) 137/63 mmHg (01/02 1400) SpO2:  [95 %-97 %] 95 % (01/02 1400) Weight:  [61.145 kg (134 lb 12.8 oz)] 61.145 kg (134 lb 12.8 oz) (01/02 0500)  Physical Exam: BP Readings from Last 1 Encounters:  08/21/12 137/63    Wt Readings from Last 1 Encounters:  08/21/12 61.145 kg (134 lb 12.8 oz)    Weight change:   HEENT: [place]/AT, Eyes-Brown, PERL, EOMI, Conjunctiva-Pale pink, Sclera-Non-icteric Neck: No JVD, No bruit, Trachea midline. Lungs:  Clear, Bilateral. Cardiac:  Regular rhythm, normal S1 and S2, no S3.  Abdomen:  Soft, epigastirc-tenderness. + BS. Extremities:  No edema present. No cyanosis. No clubbing. CNS: AxOx3, Cranial nerves grossly intact, moves all 4 extremities. Right handed. Skin: Warm and dry.   Intake/Output from previous day: 01/01 0701 - 01/02 0700 In: 1655 [P.O.:480; I.V.:1175] Out: -     Lab Results: BMET    Component Value Date/Time   NA 130* 08/21/2012 0600   NA 135* 08/18/2012 1429   K 3.8 08/21/2012 0600   K 4.2 08/18/2012 1429   CL 101 08/21/2012 0600   CL 102 08/18/2012 1429   CO2 19 08/21/2012 0600   CO2 21* 08/18/2012 1429   GLUCOSE 105* 08/21/2012 0600   GLUCOSE 129* 08/18/2012 1429   BUN 5* 08/21/2012 0600   BUN 13.0 08/18/2012 1429   CREATININE 0.72 08/21/2012 0600   CREATININE 0.9 08/18/2012 1429   CALCIUM 8.4 08/21/2012 0600   CALCIUM 9.5 08/18/2012 1429   GFRNONAA >90 08/21/2012 0600   GFRAA >90 08/21/2012 0600   CBC    Component Value Date/Time   WBC 5.4 08/21/2012 0600   WBC 4.2 08/18/2012 1429   RBC 4.24 08/21/2012 0600   RBC 4.67 08/18/2012  1429   HGB 11.3* 08/21/2012 0600   HGB 13.0 08/18/2012 1429   HCT 32.6* 08/21/2012 0600   HCT 37.8* 08/18/2012 1429   PLT 254 08/21/2012 0600   PLT 310 08/18/2012 1429   MCV 76.9* 08/21/2012 0600   MCV 80.9 08/18/2012 1429   MCH 26.7 08/21/2012 0600   MCH 27.9 08/18/2012 1429   MCHC 34.7 08/21/2012 0600   MCHC 34.5 08/18/2012 1429   RDW 15.4 08/21/2012 0600   RDW 16.4* 08/18/2012 1429   LYMPHSABS 1.6 08/18/2012 1817   LYMPHSABS 1.5 08/18/2012 1429   MONOABS 0.2 08/18/2012 1817   MONOABS 0.1 08/18/2012 1429   EOSABS 0.1 08/18/2012 1817   EOSABS 0.1 08/18/2012 1429   BASOSABS 0.0 08/18/2012 1817   BASOSABS 0.0 08/18/2012 1429   CARDIAC ENZYMES Lab Results  Component Value Date   TROPONINI <0.30 06/15/2012    Scheduled Meds:   . ciprofloxacin  500 mg Oral BID  . heparin  5,000 Units Subcutaneous Q8H  . lipase/protease/amylase  2 capsule Oral TID AC  . metroNIDAZOLE  500 mg Oral Q8H  . pantoprazole  40 mg Oral Daily  . polyethylene glycol  17 g Oral Daily  . potassium chloride  20 mEq Oral Daily   Continuous Infusions:   . dextrose 5 % and 0.45 % NaCl with KCl  10 mEq/L 30 mL/hr at 08/20/12 1334   PRN Meds:.HYDROmorphone (DILAUDID) injection, LORazepam, ondansetron, ondansetron, oxyCODONE-acetaminophen  Assessment/Plan:  Patient Active Hospital Problem List:  Abdominal pain due to enteritis  Papillary carcinoma([id number])  Hypertension  CAD  S/P Whipple procedure  S/P Pancreatic stent placement   Re-consult GI, Surgery and Oncology per family request. X-ray abdomen to rule out obstruction. NPO for now.   LOS: 3 days    Orpah Cobb  MD  08/21/2012, 3:16 PM

## 2012-08-22 ENCOUNTER — Telehealth: Payer: Self-pay | Admitting: Medical Oncology

## 2012-08-22 ENCOUNTER — Encounter (INDEPENDENT_AMBULATORY_CARE_PROVIDER_SITE_OTHER): Payer: PRIVATE HEALTH INSURANCE | Admitting: General Surgery

## 2012-08-22 LAB — COMPREHENSIVE METABOLIC PANEL
BUN: 5 mg/dL — ABNORMAL LOW (ref 6–23)
Calcium: 8.8 mg/dL (ref 8.4–10.5)
GFR calc Af Amer: >90 mL/min (ref >90)
GFR calc non Af Amer: 89 mL/min — ABNORMAL LOW (ref >90)
Glucose, Bld: 101 mg/dL — ABNORMAL HIGH (ref 70–99)
Total Protein: 6.1 g/dL (ref 6.0–8.3)

## 2012-08-22 LAB — MAGNESIUM: Magnesium: 1.7 mg/dL (ref 1.5–2.5)

## 2012-08-22 MED ORDER — ADULT MULTIVITAMIN W/MINERALS CH
1.0000 | ORAL_TABLET | Freq: Every day | ORAL | Status: DC
  Administered 2012-08-22 – 2012-08-30 (×9): 1 via ORAL
  Filled 2012-08-22 (×9): qty 1

## 2012-08-22 MED ORDER — CLINIMIX E/DEXTROSE (5/20) 5 % IV SOLN
INTRAVENOUS | Status: AC
  Administered 2012-08-22: 18:00:00 via INTRAVENOUS
  Filled 2012-08-22: qty 1000

## 2012-08-22 MED ORDER — FAT EMULSION 20 % IV EMUL
250.0000 mL | INTRAVENOUS | Status: AC
  Administered 2012-08-22: 250 mL via INTRAVENOUS
  Filled 2012-08-22: qty 250

## 2012-08-22 NOTE — Progress Notes (Signed)
[facility] Progress Note  Subjective: Patient states he feels some better with no nausea, passing loose but not runny bowel movements, still complaining of predominantly right lower quadrant abdominal pain Objective: Vital signs in last 24 hours: Temp:  [98.1 F (36.7 C)-100.2 F (37.9 C)] 100.2 F (37.9 C) (01/03 0505) Pulse Rate:  [86-101] 86  (01/03 0505) Resp:  [18-22] 18  (01/03 0505) BP: (93-137)/(50-63) 93/54 mmHg (01/03 0505) SpO2:  [95 %-96 %] 96 % (01/03 0505) Weight:  [60.056 kg (132 lb 6.4 oz)] 60.056 kg (132 lb 6.4 oz) (01/03 0505) Weight change: -1.089 kg (-2 lb 6.4 oz)   PE: Mild generalized tenderness most pronounced in the right lower quadrant over his surgical scar, no rebound  Lab Results: Results for orders placed during the hospital encounter of 08/18/12 (from the past 24 hour(s))  COMPREHENSIVE METABOLIC PANEL     Status: Abnormal   Collection Time   08/22/12  6:00 AM      Component Value Range   Sodium 133 (*) 135 - 145 mEq/L   Potassium 3.6  3.5 - 5.1 mEq/L   Chloride 102  96 - 112 mEq/L   CO2 19  19 - 32 mEq/L   Glucose, Bld 101 (*) 70 - 99 mg/dL   BUN 5 (*) 6 - 23 mg/dL   Creatinine, Ser 2.95  0.50 - 1.35 mg/dL   Calcium 8.8  8.4 - 62.1 mg/dL   Total Protein 6.1  6.0 - 8.3 g/dL   Albumin 2.5 (*) 3.5 - 5.2 g/dL   AST 308 (*) 0 - 37 U/L   ALT 174 (*) 0 - 53 U/L   Alkaline Phosphatase 140 (*) 39 - 117 U/L   Total Bilirubin 0.3  0.3 - 1.2 mg/dL   GFR calc non Af Amer 89 (*) >90 mL/min   GFR calc Af Amer >90  >90 mL/min  MAGNESIUM     Status: Normal   Collection Time   08/22/12  6:00 AM      Component Value Range   Magnesium 1.7  1.5 - 2.5 mg/dL  PHOSPHORUS     Status: Normal   Collection Time   08/22/12  6:00 AM      Component Value Range   Phosphorus 4.1  2.3 - 4.6 mg/dL    Studies/Results: Dg Abd 2 Views  08/28/2012  *RADIOLOGY REPORT*  Clinical Data: Pain, nausea and weakness.  ABDOMEN - 2 VIEW  Comparison: CT of the abdomen and  pelvis 08/18/2012.  Findings: Three views of the abdomen demonstrate some gas and stool in the colon.  There is a paucity of distal colonic gas.  There are multiple nondilated loops of gas-filled small bowel throughout the central abdomen, which are nonspecific.  No significant air fluid levels are noted on the left lateral decubitus view.  No pneumoperitoneum.  Surgical clips project over the right upper quadrant of the abdomen, compatible with prior cholecystectomy.  IMPRESSION: 1.  Nonspecific, nonobstructive bowel gas pattern. 2.  No pneumoperitoneum. 3.  Status post cholecystectomy.   Original Report Authenticated By: Trudie Reed, M.D.       Assessment: Abdominal pain 2 months after Whipple procedure with chemotherapy with nonspecific distal ileitis main finding so far  Plan: Agree with TPN if unable to advance diet Await GI pathogen panel although I suspect this will be negative. Continue Cipro and Flagyl. Consider repeat imaging of ileum with CT or CT enterography at some point if right lower quadrant pain does not improve  over the next few days.    Samiel Peel C 08/22/2012, 12:00 PM

## 2012-08-22 NOTE — Progress Notes (Signed)
  Subjective: Still having some pain, less nausea.    Objective: Vital signs in last 24 hours: Temp:  [98.1 F (36.7 C)-100.2 F (37.9 C)] 100.2 F (37.9 C) (01/03 0505) Pulse Rate:  [86-101] 86  (01/03 0505) Resp:  [18-22] 18  (01/03 0505) BP: (93-137)/(50-63) 93/54 mmHg (01/03 0505) SpO2:  [95 %-96 %] 96 % (01/03 0505) Weight:  [132 lb 6.4 oz (60.056 kg)] 132 lb 6.4 oz (60.056 kg) (01/03 0505) Last BM Date: 08/19/12  Intake/Output from previous day: 09/04/2022 0701 - 01/03 0700 In: 894 [I.V.:894] Out: 602 [Urine:600; Stool:2] Intake/Output this shift:    General appearance: alert and no distress GI: soft, non tender,  non distended. Extremities: extremities normal, atraumatic, no cyanosis or edema  Lab Results:   Basename September 04, 2012 0600 08/20/12 0520  WBC 5.4 4.6  HGB 11.3* 10.8*  HCT 32.6* 32.2*  PLT 254 267   BMET  Basename 08/22/12 0600 09-04-12 0600  NA 133* 130*  K 3.6 3.8  CL 102 101  CO2 19 19  GLUCOSE 101* 105*  BUN 5* 5*  CREATININE 0.87 0.72  CALCIUM 8.8 8.4   PT/INR No results found for this basename: LABPROT:2,INR:2 in the last 72 hours ABG No results found for this basename: PHART:2,PCO2:2,PO2:2,HCO3:2 in the last 72 hours  Studies/Results: Dg Abd 2 Views  04-Sep-2012  *RADIOLOGY REPORT*  Clinical Data: Pain, nausea and weakness.  ABDOMEN - 2 VIEW  Comparison: CT of the abdomen and pelvis 08/18/2012.  Findings: Three views of the abdomen demonstrate some gas and stool in the colon.  There is a paucity of distal colonic gas.  There are multiple nondilated loops of gas-filled small bowel throughout the central abdomen, which are nonspecific.  No significant air fluid levels are noted on the left lateral decubitus view.  No pneumoperitoneum.  Surgical clips project over the right upper quadrant of the abdomen, compatible with prior cholecystectomy.  IMPRESSION: 1.  Nonspecific, nonobstructive bowel gas pattern. 2.  No pneumoperitoneum. 3.  Status post  cholecystectomy.   Original Report Authenticated By: Trudie Reed, M.D.     Anti-infectives: Anti-infectives     Start     Dose/Rate Route Frequency Ordered Stop   04-Sep-2012 2000   ciprofloxacin (CIPRO) tablet 500 mg        500 mg Oral 2 times daily 09-04-2012 1424     08/19/12 0100   ciprofloxacin (CIPRO) IVPB 400 mg  Status:  Discontinued        400 mg 200 mL/hr over 60 Minutes Intravenous 2 times daily 08/19/12 0051 09/04/2012 1423   08/19/12 0100   metroNIDAZOLE (FLAGYL) tablet 500 mg        500 mg Oral 3 times per day 08/19/12 0051     08/18/12 2330   Ampicillin-Sulbactam (UNASYN) 3 g in sodium chloride 0.9 % 100 mL IVPB        3 g 100 mL/hr over 60 Minutes Intravenous  Once 08/18/12 2315 08/19/12 0100          Assessment/Plan: s/p * No surgery found * consider sips of clears.   Enteritis likely secondary to chemo. Should resolve with supportive care. Since has been here 4 days without significant nutrition, and still symptomatic, would add TNA for nutritional support.   Discussed with daughter Bufford Buttner [phone]   LOS: 4 days    [facility] 08/22/2012

## 2012-08-22 NOTE — Telephone Encounter (Signed)
Patient's daughter called to notify MD since patient is in hospital he would not make it for his appt schedule for labs/NP 08/26/12. Will forward message to MD.

## 2012-08-22 NOTE — Progress Notes (Signed)
PARENTERAL NUTRITION CONSULT NOTE - Follow Up  Pharmacy Consult for TNA Indication:  Bowel Rest for Small Bowel Enteritis   No Known Allergies  Patient Measurements: Height: 5\' 6"  (167.6 cm) Weight: 132 lb 6.4 oz (60.056 kg) IBW/kg (Calculated) : 63.8  Usual Weight: 155-156 lb  Vital Signs: Temp: 100.2 F (37.9 C) (01/03 0505) Temp src: Oral (01/03 0505) BP: 93/54 mmHg (01/03 0505) Pulse Rate: 86  (01/03 0505) Intake/Output from previous day: 01/02 0701 - 01/03 0700 In: 894 [I.V.:894] Out: 602 [Urine:600; Stool:2] Intake/Output from this shift:    Labs:  Basename 08/21/12 0600 08/20/12 0520  WBC 5.4 4.6  HGB 11.3* 10.8*  HCT 32.6* 32.2*  PLT 254 267  APTT -- --  INR -- --     Basename 08/22/12 0600 08/21/12 0600 08/20/12 0520  NA 133* 130* 134*  K 3.6 3.8 3.3*  CL 102 101 104  CO2 19 19 20   GLUCOSE 101* 105* 111*  BUN 5* 5* 5*  CREATININE 0.87 0.72 0.65  LABCREA -- -- --  CREAT24HRUR -- -- --  CALCIUM 8.8 8.4 8.4  MG 1.7 -- --  PHOS 4.1 -- --  PROT 6.1 -- --  ALBUMIN 2.5* -- --  AST 135* -- --  ALT 174* -- --  ALKPHOS 140* -- --  BILITOT 0.3 -- --  BILIDIR -- -- --  IBILI -- -- --  PREALBUMIN -- -- --  TRIG -- -- --  CHOLHDL -- -- --  CHOL -- -- --   Estimated Creatinine Clearance: 72 ml/min (by C-G formula based on Cr of 0.87).   No results found for this basename: GLUCAP:3 in the last 72 hours  Medical History: Past Medical History  Diagnosis Date  . Hypertension   . High cholesterol   . Coronary artery disease   . Common bile duct (CBD) stricture     Malignant  . 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 Pneumonia   . Constipation   . Myocardial infarction     15 years ago    Medications:  Scheduled:     . ciprofloxacin  500 mg Oral BID  . heparin  5,000 Units Subcutaneous Q8H  . lipase/protease/amylase  2 capsule Oral TID AC  . metroNIDAZOLE  500 mg Oral  Q8H  . pantoprazole  40 mg Oral Daily  . polyethylene glycol  17 g Oral Daily  . potassium chloride  20 mEq Oral Daily  . [DISCONTINUED] ciprofloxacin  400 mg Intravenous BID  . [DISCONTINUED] potassium chloride  20 mEq Oral BID   Infusions:     . dextrose 5 % and 0.45 % NaCl with KCl 10 mEq/L 30 mL/hr at 08/22/12 0036    Insulin Requirements in the past 24 hours:  None ordered  Nutritional Goals:  Per 12/31 RD note: Kcal: 1750-1950,  Protein: 70-90g,  Fluid: > 1.9L/day  Goal TNA: Clinimix E 5/20 @ 57ml/hr + 20% IV lipids on MWF will provide 96 g protein/day and 1896 kcal/day avg (1689 kcal on non-lipid days, 2169 kcal on lipid days)  Current Nutrition:  NPO  Assessment: 67 yo M with pancreatic cancer admitted 08/18/12 with CC: abdominal pain. Pt is s/p Whipple on 06/06/12 and s/p chemo on 08/14/12. Prior to admission, patient reported 10-15 days of abdominal pain, diarrhea, nausea, and vomiting. Per 08/22/11 Surgery note, patient has been NPO for 4 days (although PO intake recorded on 12/31  and 480 ml PO recorded on 1/1). Pt reportedly has not improved despite antibiotics. Plan now is for bowel rest for small bowel enteritis and start TNA. Per RD note, patient is classified as severely malnourished with ~16% weight loss (25 lbs) in the past 2 months and poor PO intake ~ 3 days prior to admission. Pt has an implanted Port placed 07/24/12 on L chest (as reported by RN).   Labs: Lytes: Na is low (will be unable to change Na+ content of TNA). Other lytes wnl. Renal: wnl LFTs: AST/ALT elevated but trending down - monitor CBGs: none ordered at this time, no hx of DM noted. CBGs in Nov 2013 <150. IVF: D5-1/2NS+10K @ 11ml/hr GI PPx: Protonix 40mg  PO daily  Plan: at 1800: 1) Start Clinimix E 5/20 at 40 ml/hr 2) Lipids to be given on MWF only due to national shortage 3) Multivitamin and trace elements to be provided orally since patient currently tolerating other oral meds 4) Routine  TNA labs  Hessie Knows, PharmD, BCPS Pager [phone] 08/22/2012 8:08 AM

## 2012-08-22 NOTE — Consult Note (Signed)
Ampullary carcinoma. Agree with above. TNA

## 2012-08-22 NOTE — Progress Notes (Signed)
Subjective:  Feeling better. Decreasing abdominal discomfort and diarrhea. T max 100.2 F. On liquid diet per surgery.  Objective:  Vital Signs in the last 24 hours: Temp:  [98.1 F (36.7 C)-100.2 F (37.9 C)] 100.2 F (37.9 C) (01/03 0505) Pulse Rate:  [86-101] 86  (01/03 0505) Cardiac Rhythm:  [-]  Resp:  [18-22] 18  (01/03 0505) BP: (93-137)/(50-63) 93/54 mmHg (01/03 0505) SpO2:  [95 %-96 %] 96 % (01/03 0505) Weight:  [60.056 kg (132 lb 6.4 oz)] 60.056 kg (132 lb 6.4 oz) (01/03 0505)  Physical Exam: BP Readings from Last 1 Encounters:  08/22/12 93/54    Wt Readings from Last 1 Encounters:  08/22/12 60.056 kg (132 lb 6.4 oz)    Weight change: -1.089 kg (-2 lb 6.4 oz)  HEENT: [place]/AT, Eyes-Brown, PERL, EOMI, Conjunctiva-Pale pink, Sclera-Non-icteric Neck: No JVD, No bruit, Trachea midline. Lungs:  Clear, Bilateral. Cardiac:  Regular rhythm, normal S1 and S2, no S3.  Abdomen:  Soft, mildly-tender. No guarding. + BS. Extremities:  No edema present. No cyanosis. No clubbing. CNS: AxOx3, Cranial nerves grossly intact, moves all 4 extremities. Right handed. Skin: Warm and dry.   Intake/Output from previous day: 01/02 0701 - 01/03 0700 In: 894 [I.V.:894] Out: 602 [Urine:600; Stool:2]    Lab Results: BMET    Component Value Date/Time   NA 133* 08/22/2012 0600   NA 135* 08/18/2012 1429   K 3.6 08/22/2012 0600   K 4.2 08/18/2012 1429   CL 102 08/22/2012 0600   CL 102 08/18/2012 1429   CO2 19 08/22/2012 0600   CO2 21* 08/18/2012 1429   GLUCOSE 101* 08/22/2012 0600   GLUCOSE 129* 08/18/2012 1429   BUN 5* 08/22/2012 0600   BUN 13.0 08/18/2012 1429   CREATININE 0.87 08/22/2012 0600   CREATININE 0.9 08/18/2012 1429   CALCIUM 8.8 08/22/2012 0600   CALCIUM 9.5 08/18/2012 1429   GFRNONAA 89* 08/22/2012 0600   GFRAA >90 08/22/2012 0600   CBC    Component Value Date/Time   WBC 5.4 08/21/2012 0600   WBC 4.2 08/18/2012 1429   RBC 4.24 08/21/2012 0600   RBC 4.67 08/18/2012 1429   HGB 11.3*  08/21/2012 0600   HGB 13.0 08/18/2012 1429   HCT 32.6* 08/21/2012 0600   HCT 37.8* 08/18/2012 1429   PLT 254 08/21/2012 0600   PLT 310 08/18/2012 1429   MCV 76.9* 08/21/2012 0600   MCV 80.9 08/18/2012 1429   MCH 26.7 08/21/2012 0600   MCH 27.9 08/18/2012 1429   MCHC 34.7 08/21/2012 0600   MCHC 34.5 08/18/2012 1429   RDW 15.4 08/21/2012 0600   RDW 16.4* 08/18/2012 1429   LYMPHSABS 1.6 08/18/2012 1817   LYMPHSABS 1.5 08/18/2012 1429   MONOABS 0.2 08/18/2012 1817   MONOABS 0.1 08/18/2012 1429   EOSABS 0.1 08/18/2012 1817   EOSABS 0.1 08/18/2012 1429   BASOSABS 0.0 08/18/2012 1817   BASOSABS 0.0 08/18/2012 1429   CARDIAC ENZYMES Lab Results  Component Value Date   TROPONINI <0.30 06/15/2012    Scheduled Meds:   . ciprofloxacin  500 mg Oral BID  . heparin  5,000 Units Subcutaneous Q8H  . lipase/protease/amylase  2 capsule Oral TID AC  . metroNIDAZOLE  500 mg Oral Q8H  . multivitamin with minerals  1 tablet Oral Daily  . pantoprazole  40 mg Oral Daily  . polyethylene glycol  17 g Oral Daily  . potassium chloride  20 mEq Oral Daily   Continuous Infusions:   . dextrose  5 % and 0.45 % NaCl with KCl 10 mEq/L 30 mL/hr at 08/22/12 0036  . TPN (CLINIMIX) +/- additives     And  . fat emulsion     PRN Meds:.HYDROmorphone (DILAUDID) injection, LORazepam, ondansetron, ondansetron, oxyCODONE-acetaminophen  Assessment/Plan:  Patient Active Hospital Problem List:  Abdominal pain due to enteritis  Nausea and diarrhea due to above + possible pancreatic insufficiency Papillary carcinoma(T3N1MX)  Hypertension  CAD  S/P Whipple procedure  S/P Pancreatic stent placement    May use porta-cath for TNA Answered questions by patient, wife in room and daughter, Mrs. Bufford Buttner by phone-[phone].   LOS: 4 days    Orpah Cobb  MD  08/22/2012, 10:30 AM

## 2012-08-22 NOTE — Progress Notes (Signed)
NUTRITION FOLLOW UP  Intervention:   - TPN per pharmacy - Will monitor   Nutrition Dx:   Unintended weight loss - improved, pt up 1 pound since admission, however this may be fluid related.   New nutrition dx: Altered GI function related to small bowel enteritis as evidenced by MD notes.   Goal:   Pt to consume >90% of meals - improved, intake mostly 75-100% of meals.   New goal: TPN to meet >90% of estimated nutritional needs.   Monitor:   Weights, labs, TPN, diet advancement, BM, nausea/vomiting  Assessment:   Pt with worsening nausea/vomiting around 3am on 08/21/12. GI and surgery were consulted and pt was found to have small bowel enteritis. Pt scheduled to start TPN tonight. Pt eating well before then, 75-100% of meals on regular diet. Met with pt this morning who denied any nausea/vomiting or loose stools today. Pt's only complaint is that sometimes he has stomach pain, however he states this pain is controlled with medications.   Height: Ht Readings from Last 1 Encounters:  08/19/12 5\' 6"  (1.676 m)    Weight Status:   Wt Readings from Last 1 Encounters:  08/22/12 132 lb 6.4 oz (60.056 kg)    Re-estimated needs:  Kcal: 1750-1950 Protein: 80-90g Fluid: 1.7-1.9L/day   Diet Order: NPO   Intake/Output Summary (Last 24 hours) at 08/22/12 0932 Last data filed at 08/22/12 0617  Gross per 24 hour  Intake    894 ml  Output    300 ml  Net    594 ml    Last BM: 12/31   Labs:   Lab 08/22/12 0600 08/21/12 0600 08/20/12 0520  NA 133* 130* 134*  K 3.6 3.8 3.3*  CL 102 101 104  CO2 19 19 20   BUN 5* 5* 5*  CREATININE 0.87 0.72 0.65  CALCIUM 8.8 8.4 8.4  MG 1.7 -- --  PHOS 4.1 -- --  GLUCOSE 101* 105* 111*    CBG (last 3)  No results found for this basename: GLUCAP:3 in the last 72 hours  Scheduled Meds:   . ciprofloxacin  500 mg Oral BID  . heparin  5,000 Units Subcutaneous Q8H  . lipase/protease/amylase  2 capsule Oral TID AC  . metroNIDAZOLE  500 mg  Oral Q8H  . multivitamin with minerals  1 tablet Oral Daily  . pantoprazole  40 mg Oral Daily  . polyethylene glycol  17 g Oral Daily  . potassium chloride  20 mEq Oral Daily    Continuous Infusions:   . dextrose 5 % and 0.45 % NaCl with KCl 10 mEq/L 30 mL/hr at 08/22/12 0036  . TPN [facility]) +/- additives     And  . fat emulsion       Levon Hedger MS, RD, LDN [phone] Pager [phone] After Hours Pager

## 2012-08-23 LAB — DIFFERENTIAL
Basophils Absolute: 0.1 10*3/uL (ref 0.0–0.1)
Basophils Relative: 1 % (ref 0–1)
Eosinophils Absolute: 0 10*3/uL (ref 0.0–0.7)
Lymphocytes Relative: 25 % (ref 12–46)
Monocytes Absolute: 1.6 10*3/uL — ABNORMAL HIGH (ref 0.1–1.0)
Neutrophils Relative %: 56 % (ref 43–77)

## 2012-08-23 LAB — PREALBUMIN: Prealbumin: 9 mg/dL — ABNORMAL LOW (ref 17.0–34.0)

## 2012-08-23 LAB — COMPREHENSIVE METABOLIC PANEL
ALT: 150 U/L — ABNORMAL HIGH (ref 0–53)
Albumin: 2.3 g/dL — ABNORMAL LOW (ref 3.5–5.2)
Alkaline Phosphatase: 135 U/L — ABNORMAL HIGH (ref 39–117)
BUN: 6 mg/dL (ref 6–23)
Calcium: 8.3 mg/dL — ABNORMAL LOW (ref 8.4–10.5)
Potassium: 3.8 mEq/L (ref 3.5–5.1)
Sodium: 131 mEq/L — ABNORMAL LOW (ref 135–145)
Total Protein: 6 g/dL (ref 6.0–8.3)

## 2012-08-23 LAB — CBC
Hemoglobin: 11.6 g/dL — ABNORMAL LOW (ref 13.0–17.0)
MCH: 26.3 pg (ref 26.0–34.0)
MCHC: 33.6 g/dL (ref 30.0–36.0)
Platelets: 258 10*3/uL (ref 150–400)

## 2012-08-23 LAB — TRIGLYCERIDES: Triglycerides: 106 mg/dL (ref <150)

## 2012-08-23 LAB — OCCULT BLOOD X 1 CARD TO LAB, STOOL
Fecal Occult Bld: NEGATIVE
Fecal Occult Bld: NEGATIVE

## 2012-08-23 LAB — PHOSPHORUS: Phosphorus: 3.9 mg/dL (ref 2.3–4.6)

## 2012-08-23 LAB — CHOLESTEROL, TOTAL: Cholesterol: 89 mg/dL (ref 0–200)

## 2012-08-23 MED ORDER — PANCRELIPASE (LIP-PROT-AMYL) 12000-38000 UNITS PO CPEP
4.0000 | ORAL_CAPSULE | Freq: Three times a day (TID) | ORAL | Status: DC
  Administered 2012-08-23 – 2012-08-30 (×18): 4 via ORAL
  Filled 2012-08-23 (×23): qty 4

## 2012-08-23 MED ORDER — KCL IN DEXTROSE-NACL 10-5-0.45 MEQ/L-%-% IV SOLN
INTRAVENOUS | Status: AC
  Filled 2012-08-23: qty 1000

## 2012-08-23 MED ORDER — CLINIMIX E/DEXTROSE (5/20) 5 % IV SOLN
INTRAVENOUS | Status: AC
  Administered 2012-08-23: 18:00:00 via INTRAVENOUS
  Filled 2012-08-23: qty 2000

## 2012-08-23 MED ORDER — KCL IN DEXTROSE-NACL 10-5-0.45 MEQ/L-%-% IV SOLN
INTRAVENOUS | Status: DC
  Filled 2012-08-23 (×2): qty 1000

## 2012-08-23 NOTE — Progress Notes (Signed)
Subjective:  Feeling better. Abdominal pain down 50 % per patient. Afebrile.  Objective:  Vital Signs in the last 24 hours: Temp:  [97.4 F (36.3 C)-98.7 F (37.1 C)] 97.4 F (36.3 C) (01/04 0516) Pulse Rate:  [68-76] 76  (01/04 0516) Cardiac Rhythm:  [-]  Resp:  [16-20] 20  (01/04 0516) BP: (110-122)/(51-65) 122/51 mmHg (01/04 0516) SpO2:  [97 %-100 %] 97 % (01/04 0516) Weight:  [56.8 kg (125 lb 3.5 oz)] 56.8 kg (125 lb 3.5 oz) (01/04 0516)  Physical Exam: BP Readings from Last 1 Encounters:  08/23/12 122/51    Wt Readings from Last 1 Encounters:  08/23/12 56.8 kg (125 lb 3.5 oz)    Weight change: -3.256 kg (-7 lb 2.9 oz)  HEENT: [place]/AT, Eyes-Brown, PERL, EOMI, Conjunctiva-Pale pink, Sclera-Non-icteric Neck: No JVD, No bruit, Trachea midline. Lungs:  Clear, Bilateral. Cardiac:  Regular rhythm, normal S1 and S2, no S3.  Abdomen:  Soft, non-tender. + BS. Large scar of surgery. Extremities:  No edema present. No cyanosis. No clubbing. CNS: AxOx3, Cranial nerves grossly intact, moves all 4 extremities. Right handed. Skin: Warm and dry.   Intake/Output from previous day: 01/03 0701 - 01/04 0700 In: 930.5 [I.V.:348; TPN:582.5] Out: 500 [Urine:500]    Lab Results: BMET    Component Value Date/Time   NA 131* 08/23/2012 0429   NA 135* 08/18/2012 1429   K 3.8 08/23/2012 0429   K 4.2 08/18/2012 1429   CL 100 08/23/2012 0429   CL 102 08/18/2012 1429   CO2 22 08/23/2012 0429   CO2 21* 08/18/2012 1429   GLUCOSE 110* 08/23/2012 0429   GLUCOSE 129* 08/18/2012 1429   BUN 6 08/23/2012 0429   BUN 13.0 08/18/2012 1429   CREATININE 0.72 08/23/2012 0429   CREATININE 0.9 08/18/2012 1429   CALCIUM 8.3* 08/23/2012 0429   CALCIUM 9.5 08/18/2012 1429   GFRNONAA >90 08/23/2012 0429   GFRAA >90 08/23/2012 0429   CBC    Component Value Date/Time   WBC 8.9 08/23/2012 0429   WBC 4.2 08/18/2012 1429   RBC 4.41 08/23/2012 0429   RBC 4.67 08/18/2012 1429   HGB 11.6* 08/23/2012 0429   HGB 13.0 08/18/2012  1429   HCT 34.5* 08/23/2012 0429   HCT 37.8* 08/18/2012 1429   PLT 258 08/23/2012 0429   PLT 310 08/18/2012 1429   MCV 78.2 08/23/2012 0429   MCV 80.9 08/18/2012 1429   MCH 26.3 08/23/2012 0429   MCH 27.9 08/18/2012 1429   MCHC 33.6 08/23/2012 0429   MCHC 34.5 08/18/2012 1429   RDW 16.1* 08/23/2012 0429   RDW 16.4* 08/18/2012 1429   LYMPHSABS 2.2 08/23/2012 0429   LYMPHSABS 1.5 08/18/2012 1429   MONOABS 1.6* 08/23/2012 0429   MONOABS 0.1 08/18/2012 1429   EOSABS 0.0 08/23/2012 0429   EOSABS 0.1 08/18/2012 1429   BASOSABS 0.1 08/23/2012 0429   BASOSABS 0.0 08/18/2012 1429   CARDIAC ENZYMES Lab Results  Component Value Date   TROPONINI <0.30 06/15/2012    Scheduled Meds:   . ciprofloxacin  500 mg Oral BID  . heparin  5,000 Units Subcutaneous Q8H  . lipase/protease/amylase  4 capsule Oral TID AC  . metroNIDAZOLE  500 mg Oral Q8H  . multivitamin with minerals  1 tablet Oral Daily  . pantoprazole  40 mg Oral Daily  . potassium chloride  20 mEq Oral Daily   Continuous Infusions:   . dextrose 5 % and 0.45 % NaCl with KCl 10 mEq/L    .  dextrose 5 % and 0.45 % NaCl with KCl 10 mEq/L 30 mL/hr at 08/23/12 1153  . TPN (CLINIMIX) +/- additives 40 mL/hr at 08/22/12 1805   And  . fat emulsion 250 mL (08/22/12 1805)  . TPN (CLINIMIX) +/- additives     PRN Meds:.HYDROmorphone (DILAUDID) injection, LORazepam, ondansetron, ondansetron, oxyCODONE-acetaminophen  Assessment/Plan:  Patient Active Hospital Problem List: Abdominal pain due to enteritis  Nausea and diarrhea due to above + possible pancreatic insufficiency  Papillary carcinoma(T3N1MX)  Hypertension  CAD  S/P Whipple procedure  S/P Pancreatic stent placement   Continue TPN and clear liquid. Follow with surgery.   LOS: 5 days    Orpah Cobb  MD  08/23/2012, 12:57 PM

## 2012-08-23 NOTE — Progress Notes (Signed)
Patient ID: Colin Castro, male   DOB: [birth year], 67 y.o.   MRN: [id number]    Subjective: Pt says he is feeling some better.  States pain is minimal.    Objective: Vital signs in last 24 hours: Temp:  [97.4 F (36.3 C)-98.7 F (37.1 C)] 97.4 F (36.3 C) (01/04 0516) Pulse Rate:  [68-76] 76  (01/04 0516) Resp:  [16-20] 20  (01/04 0516) BP: (110-122)/(51-65) 122/51 mmHg (01/04 0516) SpO2:  [97 %-100 %] 97 % (01/04 0516) Weight:  [125 lb 3.5 oz (56.8 kg)] 125 lb 3.5 oz (56.8 kg) (01/04 0516) Last BM Date: 08/22/12  Intake/Output from previous day: 01/03 0701 - 01/04 0700 In: 930.5 [I.V.:348; TPN:582.5] Out: 500 [Urine:500] Intake/Output this shift:    General appearance: alert and no distress GI: soft, non tender,  non distended. Extremities: extremities normal, atraumatic, no cyanosis or edema  Lab Results:   Basename 08/23/12 0429 08-29-2012 0600  WBC 8.9 5.4  HGB 11.6* 11.3*  HCT 34.5* 32.6*  PLT 258 254   BMET  Basename 08/23/12 0429 08/22/12 0600  NA 131* 133*  K 3.8 3.6  CL 100 102  CO2 22 19  GLUCOSE 110* 101*  BUN 6 5*  CREATININE 0.72 0.87  CALCIUM 8.3* 8.8   PT/INR No results found for this basename: LABPROT:2,INR:2 in the last 72 hours ABG No results found for this basename: PHART:2,PCO2:2,PO2:2,HCO3:2 in the last 72 hours  Studies/Results: Dg Abd 2 Views  08/29/12  *RADIOLOGY REPORT*  Clinical Data: Pain, nausea and weakness.  ABDOMEN - 2 VIEW  Comparison: CT of the abdomen and pelvis 08/18/2012.  Findings: Three views of the abdomen demonstrate some gas and stool in the colon.  There is a paucity of distal colonic gas.  There are multiple nondilated loops of gas-filled small bowel throughout the central abdomen, which are nonspecific.  No significant air fluid levels are noted on the left lateral decubitus view.  No pneumoperitoneum.  Surgical clips project over the right upper quadrant of the abdomen, compatible with prior cholecystectomy.  IMPRESSION:  1.  Nonspecific, nonobstructive bowel gas pattern. 2.  No pneumoperitoneum. 3.  Status post cholecystectomy.   Original Report Authenticated By: Trudie Reed, M.D.     Anti-infectives: Anti-infectives     Start     Dose/Rate Route Frequency Ordered Stop   08-29-2012 2000   ciprofloxacin (CIPRO) tablet 500 mg        500 mg Oral 2 times daily 08/29/2012 1424     08/19/12 0100   ciprofloxacin (CIPRO) IVPB 400 mg  Status:  Discontinued        400 mg 200 mL/hr over 60 Minutes Intravenous 2 times daily 08/19/12 0051 08/29/12 1423   08/19/12 0100   metroNIDAZOLE (FLAGYL) tablet 500 mg        500 mg Oral 3 times per day 08/19/12 0051     08/18/12 2330   Ampicillin-Sulbactam (UNASYN) 3 g in sodium chloride 0.9 % 100 mL IVPB        3 g 100 mL/hr over 60 Minutes Intravenous  Once 08/18/12 2315 08/19/12 0100          Assessment/Plan: s/p * No surgery found * consider sips of clears.   Enteritis likely secondary to chemo. Should resolve with supportive care. Since has been here 4 days without significant nutrition, and still symptomatic, would add TNA for nutritional support.   Will start clear liq today.     LOS: 5 days    Seraphina Mitchner  08/23/2012  

## 2012-08-23 NOTE — Progress Notes (Signed)
Subjective: Still having diarrhea. Abdominal pain much improved. No appetite, not eating much.  Objective: Vital signs in last 24 hours: Temp:  [97.4 F (36.3 C)-98.7 F (37.1 C)] 97.4 F (36.3 C) (01/04 0516) Pulse Rate:  [68-76] 76  (01/04 0516) Resp:  [16-20] 20  (01/04 0516) BP: (110-122)/(51-65) 122/51 mmHg (01/04 0516) SpO2:  [97 %-100 %] 97 % (01/04 0516) Weight:  [56.8 kg (125 lb 3.5 oz)] 56.8 kg (125 lb 3.5 oz) (01/04 0516) Weight change: -3.256 kg (-7 lb 2.9 oz) Last BM Date: 08/22/12  PE: GEN:  NAD HEENT:  Anicteric; poor dentition ABD:  Old surgical scars; soft non-tender non-distended, hyperactive bowel sounds  Lab Results:  GI pathogen stool panel results pending  CBC    Component Value Date/Time   WBC 8.9 08/23/2012 0429   WBC 4.2 08/18/2012 1429   RBC 4.41 08/23/2012 0429   RBC 4.67 08/18/2012 1429   HGB 11.6* 08/23/2012 0429   HGB 13.0 08/18/2012 1429   HCT 34.5* 08/23/2012 0429   HCT 37.8* 08/18/2012 1429   PLT 258 08/23/2012 0429   PLT 310 08/18/2012 1429   MCV 78.2 08/23/2012 0429   MCV 80.9 08/18/2012 1429   MCH 26.3 08/23/2012 0429   MCH 27.9 08/18/2012 1429   MCHC 33.6 08/23/2012 0429   MCHC 34.5 08/18/2012 1429   RDW 16.1* 08/23/2012 0429   RDW 16.4* 08/18/2012 1429   LYMPHSABS 2.2 08/23/2012 0429   LYMPHSABS 1.5 08/18/2012 1429   MONOABS 1.6* 08/23/2012 0429   MONOABS 0.1 08/18/2012 1429   EOSABS 0.0 08/23/2012 0429   EOSABS 0.1 08/18/2012 1429   BASOSABS 0.1 08/23/2012 0429   BASOSABS 0.0 08/18/2012 1429   CMP     Component Value Date/Time   NA 131* 08/23/2012 0429   NA 135* 08/18/2012 1429   K 3.8 08/23/2012 0429   K 4.2 08/18/2012 1429   CL 100 08/23/2012 0429   CL 102 08/18/2012 1429   CO2 22 08/23/2012 0429   CO2 21* 08/18/2012 1429   GLUCOSE 110* 08/23/2012 0429   GLUCOSE 129* 08/18/2012 1429   BUN 6 08/23/2012 0429   BUN 13.0 08/18/2012 1429   CREATININE 0.72 08/23/2012 0429   CREATININE 0.9 08/18/2012 1429   CALCIUM 8.3* 08/23/2012 0429   CALCIUM 9.5  08/18/2012 1429   PROT 6.0 08/23/2012 0429   PROT 7.5 08/18/2012 1429   ALBUMIN 2.3* 08/23/2012 0429   ALBUMIN 2.9* 08/18/2012 1429   AST 100* 08/23/2012 0429   AST 158* 08/18/2012 1429   ALT 150* 08/23/2012 0429   ALT 333* 08/18/2012 1429   ALKPHOS 135* 08/23/2012 0429   ALKPHOS 141 08/18/2012 1429   BILITOT 0.2* 08/23/2012 0429   BILITOT 0.46 08/18/2012 1429   GFRNONAA >90 08/23/2012 0429   GFRAA >90 08/23/2012 0429   Assessment:  1.  Diarrhea.  Could be chemotherapy-related.  Pancreatic insufficiency post pancreatic resection also consideration (he is only receiving modest dose of pancreatic enzymes at this time).  Infectious etiology also possible, but less favored. 2.  Small bowel enteritis on CT scan.  Unclear significance.   3.  Abdominal pain, much improved. 4.  Failure to thrive, poor nutrition, feeding difficulties.  Plan:  1.  Stop Miralax; he is having diarrhea (and it does not seem to be overflow diarrhea in origin). 2.  Increase pancreatic enzyme dosing. 3.  Awaiting stool studies. 4.  Defer TPN or alternative nutritive routes to surgical team. 5.  Will sign-off; please call with questions; thank  you for the consult.   Colin Castro 08/23/2012, 12:25 PM

## 2012-08-23 NOTE — Progress Notes (Signed)
PARENTERAL NUTRITION CONSULT NOTE - Follow Up  Pharmacy Consult for TNA Indication:  Bowel Rest for Small Bowel Enteritis   No Known Allergies  Patient Measurements: Height: 5\' 6"  (167.6 cm) Weight: 125 lb 3.5 oz (56.8 kg) IBW/kg (Calculated) : 63.8  Usual Weight: 155-156 lb  Vital Signs: Temp: 97.4 F (36.3 C) (01/04 0516) Temp src: Oral (01/04 0516) BP: 122/51 mmHg (01/04 0516) Pulse Rate: 76  (01/04 0516) Intake/Output from previous day: 01/03 0701 - 01/04 0700 In: 930.5 [I.V.:348; TPN:582.5] Out: 500 [Urine:500] Intake/Output from this shift:    Labs:  Basename 08/23/12 0429 08/21/12 0600  WBC 8.9 5.4  HGB 11.6* 11.3*  HCT 34.5* 32.6*  PLT 258 254  APTT -- --  INR -- --     Basename 08/23/12 0429 08/23/12 0428 08/22/12 0600 08/21/12 0600  NA 131* -- 133* 130*  K 3.8 -- 3.6 3.8  CL 100 -- 102 101  CO2 22 -- 19 19  GLUCOSE 110* -- 101* 105*  BUN 6 -- 5* 5*  CREATININE 0.72 -- 0.87 0.72  LABCREA -- -- -- --  CREAT24HRUR -- -- -- --  CALCIUM 8.3* -- 8.8 8.4  MG 1.8 -- 1.7 --  PHOS 3.9 -- 4.1 --  PROT 6.0 -- 6.1 --  ALBUMIN 2.3* -- 2.5* --  AST 100* -- 135* --  ALT 150* -- 174* --  ALKPHOS 135* -- 140* --  BILITOT 0.2* -- 0.3 --  BILIDIR -- -- -- --  IBILI -- -- -- --  PREALBUMIN -- -- -- --  TRIG -- 106 -- --  CHOLHDL -- -- -- --  CHOL -- 89 -- --   Estimated Creatinine Clearance: 74 ml/min (by C-G formula based on Cr of 0.72).   No results found for this basename: GLUCAP:3 in the last 72 hours  Medical History: Past Medical History  Diagnosis Date  . Hypertension   . High cholesterol   . Coronary artery disease   . Common bile duct (CBD) stricture     Malignant  . 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 Pneumonia   . Constipation   . Myocardial infarction     15 years ago    Medications:  Scheduled:     . ciprofloxacin  500 mg Oral BID  . heparin  5,000 Units  Subcutaneous Q8H  . lipase/protease/amylase  2 capsule Oral TID AC  . metroNIDAZOLE  500 mg Oral Q8H  . multivitamin with minerals  1 tablet Oral Daily  . pantoprazole  40 mg Oral Daily  . polyethylene glycol  17 g Oral Daily  . potassium chloride  20 mEq Oral Daily   Infusions:     . dextrose 5 % and 0.45 % NaCl with KCl 10 mEq/L    . dextrose 5 % and 0.45 % NaCl with KCl 10 mEq/L    . TPN (CLINIMIX) +/- additives 40 mL/hr at 08/22/12 1805   And  . fat emulsion 250 mL (08/22/12 1805)  . TPN (CLINIMIX) +/- additives    . [DISCONTINUED] dextrose 5 % and 0.45 % NaCl with KCl 10 mEq/L 30 mL/hr at 08/23/12 0542    Insulin Requirements in the past 24 hours:  None ordered  Nutritional Goals:  Per 12/31 RD note: Kcal: 1750-1950,  Protein: 70-90g,  Fluid: > 1.9L/day  Goal TNA: Clinimix E 5/20 @ 69ml/hr + 20% IV lipids on MWF will provide 96  g protein/day and 1896 kcal/day avg (1689 kcal on non-lipid days, 2169 kcal on lipid days)  Current Nutrition:  NPO >> CL to start today IVF: D5-1/2NS+10K @ 9ml/hr  Assessment: 67 yo M with pancreatic cancer admitted 08/18/12 with CC: abdominal pain. Pt is s/p Whipple on 06/06/12 and s/p chemo on 08/14/12. Prior to admission, patient reported 10-15 days of abdominal pain, diarrhea, nausea, and vomiting.  Pt was started on TNA via PAC for nutritional support 1/3. Per RD note, patient is classified as severely malnourished with ~16% weight loss (25 lbs) in the past 2 months and poor PO intake ~ 3 days prior to admission.   Labs  Chem: Na is low (cannot alter Na+ conc of TNA). Other lytes wnl. Scr wnl. Glucose wnl  Lipid Profile: WNL 1/4  Prealbumin in process  LFT: AST/ALT elevated   Plan Increase Clinimix E 5/20 to 50 ml/hr, decrease MIVF to KVO Lipids to be given on MWF only due to national shortage Multivitamin and trace elements to be provided orally since patient currently tolerating other oral meds AM BMET  Gwen Her PharmD    [phone] 08/23/2012 10:36 AM

## 2012-08-24 LAB — BASIC METABOLIC PANEL
CO2: 21 mEq/L (ref 19–32)
Chloride: 101 mEq/L (ref 96–112)
Glucose, Bld: 122 mg/dL — ABNORMAL HIGH (ref 70–99)
Potassium: 4 mEq/L (ref 3.5–5.1)
Sodium: 132 mEq/L — ABNORMAL LOW (ref 135–145)

## 2012-08-24 MED ORDER — ZOLPIDEM TARTRATE 5 MG PO TABS
5.0000 mg | ORAL_TABLET | Freq: Every evening | ORAL | Status: DC | PRN
  Administered 2012-08-24 – 2012-08-29 (×4): 5 mg via ORAL
  Filled 2012-08-24 (×4): qty 1

## 2012-08-24 MED ORDER — CLINIMIX E/DEXTROSE (5/20) 5 % IV SOLN
INTRAVENOUS | Status: AC
  Administered 2012-08-24: 17:00:00 via INTRAVENOUS
  Filled 2012-08-24: qty 2000

## 2012-08-24 MED ORDER — CALCIUM CARBONATE ANTACID 500 MG PO CHEW
1.0000 | CHEWABLE_TABLET | Freq: Three times a day (TID) | ORAL | Status: DC
  Administered 2012-08-24 – 2012-08-30 (×16): 200 mg via ORAL
  Filled 2012-08-24 (×20): qty 1

## 2012-08-24 NOTE — Progress Notes (Signed)
Subjective:  Increase bubbles in stomach. Afebrile. No sleep last night.  Objective:  Vital Signs in the last 24 hours: Temp:  [97.5 F (36.4 C)-98.5 F (36.9 C)] 97.5 F (36.4 C) (01/05 1439) Pulse Rate:  [81-98] 81  (01/05 1439) Cardiac Rhythm:  [-]  Resp:  [18-20] 18  (01/05 1439) BP: (109-113)/(53-82) 109/59 mmHg (01/05 1439) SpO2:  [97 %-100 %] 97 % (01/05 1439)  Physical Exam: BP Readings from Last 1 Encounters:  08/24/12 109/59    Wt Readings from Last 1 Encounters:  08/23/12 56.8 kg (125 lb 3.5 oz)    Weight change:   HEENT: [place]/AT, Eyes-Brown, PERL, EOMI, Conjunctiva-Pale pink, Sclera-Non-icteric Neck: No JVD, No bruit, Trachea midline. Lungs:  Clear, Bilateral. Cardiac:  Regular rhythm, normal S1 and S2, no S3.  Abdomen:  Soft, non-tender. + increased BS. Extremities:  No edema present. No cyanosis. No clubbing. CNS: AxOx3, Cranial nerves grossly intact, moves all 4 extremities. Right handed. Skin: Warm and dry.   Intake/Output from previous day: 01/04 0701 - 01/05 0700 In: 2150.8 [P.O.:720; I.V.:335.3; TPN:1095.5] Out: 550 [Urine:550]    Lab Results: BMET    Component Value Date/Time   NA 132* 08/24/2012 0753   NA 135* 08/18/2012 1429   K 4.0 08/24/2012 0753   K 4.2 08/18/2012 1429   CL 101 08/24/2012 0753   CL 102 08/18/2012 1429   CO2 21 08/24/2012 0753   CO2 21* 08/18/2012 1429   GLUCOSE 122* 08/24/2012 0753   GLUCOSE 129* 08/18/2012 1429   BUN 9 08/24/2012 0753   BUN 13.0 08/18/2012 1429   CREATININE 0.70 08/24/2012 0753   CREATININE 0.9 08/18/2012 1429   CALCIUM 8.3* 08/24/2012 0753   CALCIUM 9.5 08/18/2012 1429   GFRNONAA >90 08/24/2012 0753   GFRAA >90 08/24/2012 0753   CBC    Component Value Date/Time   WBC 8.9 08/23/2012 0429   WBC 4.2 08/18/2012 1429   RBC 4.41 08/23/2012 0429   RBC 4.67 08/18/2012 1429   HGB 11.6* 08/23/2012 0429   HGB 13.0 08/18/2012 1429   HCT 34.5* 08/23/2012 0429   HCT 37.8* 08/18/2012 1429   PLT 258 08/23/2012 0429   PLT 310  08/18/2012 1429   MCV 78.2 08/23/2012 0429   MCV 80.9 08/18/2012 1429   MCH 26.3 08/23/2012 0429   MCH 27.9 08/18/2012 1429   MCHC 33.6 08/23/2012 0429   MCHC 34.5 08/18/2012 1429   RDW 16.1* 08/23/2012 0429   RDW 16.4* 08/18/2012 1429   LYMPHSABS 2.2 08/23/2012 0429   LYMPHSABS 1.5 08/18/2012 1429   MONOABS 1.6* 08/23/2012 0429   MONOABS 0.1 08/18/2012 1429   EOSABS 0.0 08/23/2012 0429   EOSABS 0.1 08/18/2012 1429   BASOSABS 0.1 08/23/2012 0429   BASOSABS 0.0 08/18/2012 1429   CARDIAC ENZYMES Lab Results  Component Value Date   TROPONINI <0.30 06/15/2012    Scheduled Meds:   . ciprofloxacin  500 mg Oral BID  . heparin  5,000 Units Subcutaneous Q8H  . lipase/protease/amylase  4 capsule Oral TID AC  . metroNIDAZOLE  500 mg Oral Q8H  . multivitamin with minerals  1 tablet Oral Daily  . pantoprazole  40 mg Oral Daily  . potassium chloride  20 mEq Oral Daily   Continuous Infusions:   . dextrose 5 % and 0.45 % NaCl with KCl 10 mEq/L 10 mL/hr (08/23/12 1728)  . TPN (CLINIMIX) +/- additives 50 mL/hr at 08/23/12 1739  . TPN (CLINIMIX) +/- additives     PRN Meds:.HYDROmorphone (  DILAUDID) injection, LORazepam, ondansetron, ondansetron, oxyCODONE-acetaminophen, zolpidem  Assessment/Plan:  Patient Active Hospital Problem List: Abdominal pain due to enteritis  Nausea and diarrhea due to above + possible pancreatic insufficiency  Papillary carcinoma([id number])  Hypertension  CAD  S/P Whipple procedure  S/P Pancreatic stent placement   Continue medical treatment. Ambien for sleep    LOS: 6 days    Orpah Cobb  MD  08/24/2012, 3:41 PM

## 2012-08-24 NOTE — Progress Notes (Signed)
Patient ID: Colin Castro, male   DOB: 1945/12/13, 67 y.o.   MRN: [id number]    Subjective: Pt tolerated clears.  Stomach feels "rumbly."    Objective: Vital signs in last 24 hours: Temp:  [97.9 F (36.6 C)-98.5 F (36.9 C)] 98.5 F (36.9 C) (01/05 0700) Pulse Rate:  [74-98] 98  (01/05 0700) Resp:  [18-20] 18  (01/05 0700) BP: (109-113)/(53-82) 113/53 mmHg (01/05 0700) SpO2:  [98 %-100 %] 100 % (01/05 0700) Last BM Date: 08/24/11  Intake/Output from previous day: 01/04 0701 - 01/05 0700 In: 2150.8 [P.O.:720; I.V.:335.3; TPN:1095.5] Out: 550 [Urine:550] Intake/Output this shift: Total I/O In: 240 [P.O.:240] Out: 300 [Urine:300]  General appearance: alert and no distress GI: soft, non tender,  non distended. Extremities: extremities normal, atraumatic, no cyanosis or edema  Lab Results:   [facility] 08/23/12 0429  WBC 8.9  HGB 11.6*  HCT 34.5*  PLT 258   BMET  Basename 08/23/12 0429 08/22/12 0600  NA 131* 133*  K 3.8 3.6  CL 100 102  CO2 22 19  GLUCOSE 110* 101*  BUN 6 5*  CREATININE 0.72 0.87  CALCIUM 8.3* 8.8   PT/INR No results found for this basename: LABPROT:2,INR:2 in the last 72 hours ABG No results found for this basename: PHART:2,PCO2:2,PO2:2,HCO3:2 in the last 72 hours  Studies/Results: No results found.  Anti-infectives: Anti-infectives     Start     Dose/Rate Route Frequency Ordered Stop   08/21/12 2000   ciprofloxacin (CIPRO) tablet 500 mg        500 mg Oral 2 times daily 08/21/12 1424     08/19/12 0100   ciprofloxacin (CIPRO) IVPB 400 mg  Status:  Discontinued        400 mg 200 mL/hr over 60 Minutes Intravenous 2 times daily 08/19/12 0051 08/21/12 1423   08/19/12 0100   metroNIDAZOLE (FLAGYL) tablet 500 mg        500 mg Oral 3 times per day 08/19/12 0051     08/18/12 2330   Ampicillin-Sulbactam (UNASYN) 3 g in sodium chloride 0.9 % 100 mL IVPB        3 g 100 mL/hr over 60 Minutes Intravenous  Once 08/18/12 2315 08/19/12 0100           Assessment/Plan: s/p * No surgery found * consider sips of clears.   Enteritis likely secondary to chemo. Should resolve with supportive care. Advance to fulls today and low fat diet in AM tomorrow.   If tolerates fulls, could wean TNA tonight.   LOS: 6 days    [facility] 08/24/2012

## 2012-08-24 NOTE — Progress Notes (Signed)
PARENTERAL NUTRITION CONSULT NOTE - Follow Up  Pharmacy Consult for TNA Indication:  Bowel Rest for Small Bowel Enteritis   No Known Allergies  Patient Measurements: Height: 5\' 6"  (167.6 cm) Weight: 125 lb 3.5 oz (56.8 kg) IBW/kg (Calculated) : 63.8  Usual Weight: 155-156 lb  Vital Signs: Temp: 98.5 F (36.9 C) (01/05 0700) Temp src: Oral (01/05 0700) BP: 113/53 mmHg (01/05 0700) Pulse Rate: 98  (01/05 0700) Intake/Output from previous day: 01/04 0701 - 01/05 0700 In: 1670.8 [P.O.:720; I.V.:255.3; TPN:695.5] Out: 550 [Urine:550] Intake/Output from this shift: Total I/O In: 240 [P.O.:240] Out: 300 [Urine:300]  Labs:  [facility] 08/23/12 0429  WBC 8.9  HGB 11.6*  HCT 34.5*  PLT 258  APTT --  INR --     Basename 08/23/12 0429 08/23/12 0428 08/22/12 0600  NA 131* -- 133*  K 3.8 -- 3.6  CL 100 -- 102  CO2 22 -- 19  GLUCOSE 110* -- 101*  BUN 6 -- 5*  CREATININE 0.72 -- 0.87  LABCREA -- -- --  CREAT24HRUR -- -- --  CALCIUM 8.3* -- 8.8  MG 1.8 -- 1.7  PHOS 3.9 -- 4.1  PROT 6.0 -- 6.1  ALBUMIN 2.3* -- 2.5*  AST 100* -- 135*  ALT 150* -- 174*  ALKPHOS 135* -- 140*  BILITOT 0.2* -- 0.3  BILIDIR -- -- --  IBILI -- -- --  PREALBUMIN 9.0* -- --  TRIG -- 106 --  CHOLHDL -- -- --  CHOL -- 89 --   Estimated Creatinine Clearance: 74 ml/min (by C-G formula based on Cr of 0.72).   No results found for this basename: GLUCAP:3 in the last 72 hours  Medical History: Past Medical History  Diagnosis Date  . Hypertension   . High cholesterol   . Coronary artery disease   . Common bile duct (CBD) stricture     Malignant  . 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 Pneumonia   . Constipation   . Myocardial infarction     15 years ago    Medications:  Scheduled:     . ciprofloxacin  500 mg Oral BID  . heparin  5,000 Units Subcutaneous Q8H  . lipase/protease/amylase  4 capsule Oral TID AC    . metroNIDAZOLE  500 mg Oral Q8H  . multivitamin with minerals  1 tablet Oral Daily  . pantoprazole  40 mg Oral Daily  . potassium chloride  20 mEq Oral Daily  . [DISCONTINUED] lipase/protease/amylase  2 capsule Oral TID AC  . [DISCONTINUED] polyethylene glycol  17 g Oral Daily   Infusions:     . dextrose 5 % and 0.45 % NaCl with KCl 10 mEq/L 10 mL/hr (08/23/12 1728)  . [EXPIRED] dextrose 5 % and 0.45 % NaCl with KCl 10 mEq/L 30 mL/hr at 08/23/12 1153  . [EXPIRED] TPN (CLINIMIX) +/- additives 40 mL/hr at 08/22/12 1805   And  . [EXPIRED] fat emulsion 250 mL (08/22/12 1805)  . TPN (CLINIMIX) +/- additives 50 mL/hr at 08/23/12 1739  . [DISCONTINUED] dextrose 5 % and 0.45 % NaCl with KCl 10 mEq/L 30 mL/hr at 08/23/12 0542    Insulin Requirements in the past 24 hours:  None ordered  Nutritional Goals:  Per 12/31 RD note: Kcal: 1750-1950,  Protein: 70-90g,  Fluid: > 1.9L/day  Goal TNA: Clinimix E 5/20 @ 35ml/hr + 20% IV lipids on MWF will provide 96 g protein/day and 1896  kcal/day avg (1689 kcal on non-lipid days, 2169 kcal on lipid days)  Current Nutrition:  NPO >> CL (1/4) IVF: D5-1/2NS+10K @ 16ml/hr  Assessment: 67 yo M with pancreatic cancer admitted 08/18/12 with CC: abdominal pain. Pt is s/p Whipple on 06/06/12 and s/p chemo on 08/14/12. Prior to admission, patient reported 10-15 days of abdominal pain, diarrhea, nausea, and vomiting.  Pt was started on TNA via PAC for nutritional support 1/3. Per RD note, patient is classified as severely malnourished with ~16% weight loss (25 lbs) in the past 2 months and poor PO intake ~ 3 days prior to admission.   Labs  Chem: Na is low (cannot alter Na+ conc of TNA). Other lytes wnl. Scr wnl. Glucose wnl  Lipid Profile: WNL 1/4  Prealbumin 9, low, not representative of full nutritional support. Indicates depleted protein stores at baseline.  LFT: AST/ALT elevated (1/4), trended down.   Plan Increase Clinimix E 5/20 to  27ml/hr,  continue MIVF @ KVO Lipids to be given on MWF only due to national shortage Multivitamin and trace elements to be provided orally since patient currently tolerating other oral meds Routine TNA labs in AM  [facility] PharmD  [phone] 08/24/2012 7:33 AM

## 2012-08-24 NOTE — Progress Notes (Signed)
Pt complain of pain in his Rt.LA rate it #8.Diet advaNCED TO FULL LIQUID. wIFE IN WITH PT. Most of the day

## 2012-08-25 LAB — DIFFERENTIAL
Eosinophils Relative: 1 % (ref 0–5)
Lymphs Abs: 2.5 10*3/uL (ref 0.7–4.0)
Monocytes Absolute: 2.1 10*3/uL — ABNORMAL HIGH (ref 0.1–1.0)

## 2012-08-25 LAB — COMPREHENSIVE METABOLIC PANEL
ALT: 78 U/L — ABNORMAL HIGH (ref 0–53)
Alkaline Phosphatase: 138 U/L — ABNORMAL HIGH (ref 39–117)
CO2: 20 mEq/L (ref 19–32)
Chloride: 99 mEq/L (ref 96–112)
GFR calc Af Amer: >90 mL/min (ref >90)
GFR calc non Af Amer: >90 mL/min (ref >90)
Glucose, Bld: 132 mg/dL — ABNORMAL HIGH (ref 70–99)
Potassium: 3.7 mEq/L (ref 3.5–5.1)
Sodium: 129 mEq/L — ABNORMAL LOW (ref 135–145)
Total Bilirubin: 0.2 mg/dL — ABNORMAL LOW (ref 0.3–1.2)
Total Protein: 6.1 g/dL (ref 6.0–8.3)

## 2012-08-25 LAB — PREALBUMIN: Prealbumin: 11.7 mg/dL — ABNORMAL LOW (ref 17.0–34.0)

## 2012-08-25 LAB — CBC
MCH: 26.5 pg (ref 26.0–34.0)
MCHC: 34.2 g/dL (ref 30.0–36.0)
MCV: 77.6 fL — ABNORMAL LOW (ref 78.0–100.0)
Platelets: 238 10*3/uL (ref 150–400)
RBC: 4.6 MIL/uL (ref 4.22–5.81)

## 2012-08-25 LAB — GI PATHOGEN PANEL BY PCR, STOOL
Campylobacter by PCR: NEGATIVE
Cryptosporidium by PCR: NEGATIVE
G lamblia by PCR: NEGATIVE
Norovirus GI/GII: NEGATIVE
Rotavirus A by PCR: NEGATIVE
Shigella by PCR: NEGATIVE

## 2012-08-25 LAB — OCCULT BLOOD X 1 CARD TO LAB, STOOL: Fecal Occult Bld: POSITIVE — AB

## 2012-08-25 MED ORDER — FUROSEMIDE 40 MG PO TABS
40.0000 mg | ORAL_TABLET | Freq: Every day | ORAL | Status: DC
  Administered 2012-08-25 – 2012-08-27 (×3): 40 mg via ORAL
  Filled 2012-08-25 (×3): qty 1

## 2012-08-25 MED ORDER — FAT EMULSION 20 % IV EMUL
250.0000 mL | INTRAVENOUS | Status: AC
  Administered 2012-08-25: 250 mL via INTRAVENOUS
  Filled 2012-08-25: qty 250

## 2012-08-25 MED ORDER — POTASSIUM CHLORIDE CRYS ER 20 MEQ PO TBCR
20.0000 meq | EXTENDED_RELEASE_TABLET | Freq: Two times a day (BID) | ORAL | Status: DC
  Administered 2012-08-25 – 2012-08-27 (×4): 20 meq via ORAL
  Filled 2012-08-25 (×4): qty 1

## 2012-08-25 MED ORDER — CLINIMIX E/DEXTROSE (5/20) 5 % IV SOLN
INTRAVENOUS | Status: AC
  Administered 2012-08-25: 17:00:00 via INTRAVENOUS
  Filled 2012-08-25: qty 2000

## 2012-08-25 NOTE — Progress Notes (Signed)
PARENTERAL NUTRITION CONSULT NOTE - Follow Up  Pharmacy Consult for TNA Indication:  Bowel Rest for Small Bowel Enteritis   No Known Allergies  Patient Measurements: Height: 5\' 6"  (167.6 cm) Weight: 125 lb 3.5 oz (56.8 kg) IBW/kg (Calculated) : 63.8  Usual Weight: 155-156 lb  Vital Signs: Temp: 98.9 F (37.2 C) (01/06 0535) Temp src: Oral (01/06 0535) BP: 109/60 mmHg (01/06 0535) Pulse Rate: 96  (01/06 0535) Intake/Output from previous day: 01/05 0701 - 01/06 0700 In: 2826.8 [P.O.:840; I.V.:240; TPN:1746.8] Out: 300 [Urine:300] Intake/Output from this shift:   Labs:  [facility] 08/25/12 0520 08/23/12 0429  WBC 11.3* 8.9  HGB 12.2* 11.6*  HCT 35.7* 34.5*  PLT 238 258  APTT -- --  INR -- --    Basename 08/25/12 0520 08/24/12 0753 08/23/12 0429 08/23/12 0428  NA 129* 132* 131* --  K 3.7 4.0 3.8 --  CL 99 101 100 --  CO2 20 21 22  --  GLUCOSE 132* 122* 110* --  BUN 9 9 6  --  CREATININE 0.65 0.70 0.72 --  LABCREA -- -- -- --  CREAT24HRUR -- -- -- --  CALCIUM 8.2* 8.3* 8.3* --  MG 1.7 -- 1.8 --  PHOS 3.0 -- 3.9 --  PROT 6.1 -- 6.0 --  ALBUMIN 2.4* -- 2.3* --  AST 29 -- 100* --  ALT 78* -- 150* --  ALKPHOS 138* -- 135* --  BILITOT 0.2* -- 0.2* --  BILIDIR -- -- -- --  IBILI -- -- -- --  PREALBUMIN -- -- 9.0* --  TRIG 121 -- -- 106  CHOLHDL -- -- -- --  CHOL 93 -- -- 89   Estimated Creatinine Clearance: 74 ml/min (by C-G formula based on Cr of 0.65).   No results found for this basename: GLUCAP:3 in the last 72 hours  Medications:  Scheduled:     . calcium carbonate  1 tablet Oral TID  . ciprofloxacin  500 mg Oral BID  . heparin  5,000 Units Subcutaneous Q8H  . lipase/protease/amylase  4 capsule Oral TID AC  . metroNIDAZOLE  500 mg Oral Q8H  . multivitamin with minerals  1 tablet Oral Daily  . pantoprazole  40 mg Oral Daily  . potassium chloride  20 mEq Oral Daily   Infusions:     . dextrose 5 % and 0.45 % NaCl with KCl 10 mEq/L 10 mL/hr  (08/23/12 1728)  . [EXPIRED] TPN (CLINIMIX) +/- additives 50 mL/hr at 08/23/12 1739  . TPN (CLINIMIX) +/- additives 65 mL/hr at 08/24/12 1714    Insulin Requirements in the past 24 hours:  None ordered  Nutritional Goals:  Per 12/31 RD note: Kcal: 1750-1950,  Protein: 70-90g,  Fluid: > 1.9L/day  Goal TNA: Clinimix E 5/20 @ 51ml/hr + 20% IV lipids on MWF will provide 96 g protein/day and 1896 kcal/day avg (1689 kcal on non-lipid days, 2169 kcal on lipid days)  Current Nutrition:  Regular diet; fat modified IVF: D5-1/2NS+10K @ 40ml/hr  Assessment: 67 yo M with pancreatic cancer admitted 08/18/12 with CC: abdominal pain. Pt is s/p Whipple on 06/06/12 and s/p chemo on 08/14/12. Prior to admission, patient reported 10-15 days of abdominal pain, diarrhea, nausea, and vomiting.  Pt was started on TNA via PAC for nutritional support 1/3. Per RD note, patient is classified as severely malnourished with ~16% weight loss (25 lbs) in the past 2 months and poor PO intake ~ 3 days prior to admission.   Labs  Chem: Na remains  low (cannot adjust Na+ conc of TNA). Corrected Ca wnl,   Lipid Profile: TG 121  Prealbumin 9, low, not representative of full nutritional support. Indicates depleted protein stores at baseline.  LFT: AST/ALT decreased, Alk Phos slightlly elevated, stable   Plan  Continue Clinimix E 5/20 at 58ml/hr, continue MIVF @ KVO  Lipids to be given on MWF only due to national shortage  Multivitamin and trace elements to be provided orally since patient currently tolerating other oral meds  Plan to d/c home (per surgery-possibly today/tomorrow) when able to take adequate po to avoid dehydration.  Otho Bellows PharmD  [phone] 08/25/2012 8:29 AM

## 2012-08-25 NOTE — Progress Notes (Signed)
Patient ID: Marcquis Ridlon, male   DOB: Aug 24, 1945, 67 y.o.   MRN: [id number]    Subjective: Pt tolerated full liquids, but did have episode of pain yesterday 8/10.  Did not eat much.    Objective: Vital signs in last 24 hours: Temp:  [97.5 F (36.4 C)-98.9 F (37.2 C)] 98.9 F (37.2 C) (01/06 0535) Pulse Rate:  [81-96] 96  (01/06 0535) Resp:  [18-24] 24  (01/06 0535) BP: (109-119)/(59-61) 109/60 mmHg (01/06 0535) SpO2:  [96 %-99 %] 96 % (01/06 0535) Last BM Date: 08/24/12  Intake/Output from previous day: 01/05 0701 - 01/06 0700 In: 1869.3 [P.O.:840; I.V.:112.3; TPN:917] Out: 300 [Urine:300] Intake/Output this shift:    General appearance: alert and no distress GI: soft, non tender,  non distended. Extremities: extremities normal, atraumatic, no cyanosis or edema  Lab Results:   [facility] 08/25/12 0520 08/23/12 0429  WBC 11.3* 8.9  HGB 12.2* 11.6*  HCT 35.7* 34.5*  PLT 238 258   BMET  Basename 08/25/12 0520 08/24/12 0753  NA 129* 132*  K 3.7 4.0  CL 99 101  CO2 20 21  GLUCOSE 132* 122*  BUN 9 9  CREATININE 0.65 0.70  CALCIUM 8.2* 8.3*   PT/INR No results found for this basename: LABPROT:2,INR:2 in the last 72 hours ABG No results found for this basename: PHART:2,PCO2:2,PO2:2,HCO3:2 in the last 72 hours  Studies/Results: No results found.  Anti-infectives: Anti-infectives     Start     Dose/Rate Route Frequency Ordered Stop   08/21/12 2000   ciprofloxacin (CIPRO) tablet 500 mg        500 mg Oral 2 times daily 08/21/12 1424     08/19/12 0100   ciprofloxacin (CIPRO) IVPB 400 mg  Status:  Discontinued        400 mg 200 mL/hr over 60 Minutes Intravenous 2 times daily 08/19/12 0051 08/21/12 1423   08/19/12 0100   metroNIDAZOLE (FLAGYL) tablet 500 mg        500 mg Oral 3 times per day 08/19/12 0051     08/18/12 2330   Ampicillin-Sulbactam (UNASYN) 3 g in sodium chloride 0.9 % 100 mL IVPB        3 g 100 mL/hr over 60 Minutes Intravenous  Once 08/18/12 2315  08/19/12 0100          Assessment/Plan: s/p * No surgery found * consider sips of clears.   Enteritis likely secondary to chemo. Should resolve with supportive care. At this point, diet as tolerated.  Wean tna and discharge when taking in enough po to avoid dehydration.  Possibly this afternoon or tomorrow.   LOS: 7 days    [facility] 08/25/2012

## 2012-08-25 NOTE — Progress Notes (Signed)
Subjective:  Feeling better. Diarrhea continues. Afebrile.  Objective:  Vital Signs in the last 24 hours: Temp:  [97.5 F (36.4 C)-98.9 F (37.2 C)] 98.1 F (36.7 C) (01/06 1409) Pulse Rate:  [81-96] 87  (01/06 1409) Cardiac Rhythm:  [-]  Resp:  [18-24] 20  (01/06 1409) BP: (109-119)/(58-61) 112/58 mmHg (01/06 1409) SpO2:  [96 %-99 %] 96 % (01/06 1409)  Physical Exam: BP Readings from Last 1 Encounters:  08/25/12 112/58    Wt Readings from Last 1 Encounters:  08/23/12 56.8 kg (125 lb 3.5 oz)    Weight change:   HEENT: [place]/AT, Eyes-Brown, PERL, EOMI, Conjunctiva-Pale pink, Sclera-Non-icteric Neck: No JVD, No bruit, Trachea midline. Lungs:  Clear, Bilateral. Cardiac:  Regular rhythm, normal S1 and S2, no S3.  Abdomen:  Soft, non-tender. Normal BS. Extremities:  No edema present. No cyanosis. No clubbing. CNS: AxOx3, Cranial nerves grossly intact, moves all 4 extremities. Right handed. Skin: Warm and dry.   Intake/Output from previous day: 01/05 0701 - 01/06 0700 In: 2826.8 [P.O.:840; I.V.:240; TPN:1746.8] Out: 300 [Urine:300]    Lab Results: BMET    Component Value Date/Time   NA 129* 08/25/2012 0520   NA 135* 08/18/2012 1429   K 3.7 08/25/2012 0520   K 4.2 08/18/2012 1429   CL 99 08/25/2012 0520   CL 102 08/18/2012 1429   CO2 20 08/25/2012 0520   CO2 21* 08/18/2012 1429   GLUCOSE 132* 08/25/2012 0520   GLUCOSE 129* 08/18/2012 1429   BUN 9 08/25/2012 0520   BUN 13.0 08/18/2012 1429   CREATININE 0.65 08/25/2012 0520   CREATININE 0.9 08/18/2012 1429   CALCIUM 8.2* 08/25/2012 0520   CALCIUM 9.5 08/18/2012 1429   GFRNONAA >90 08/25/2012 0520   GFRAA >90 08/25/2012 0520   CBC    Component Value Date/Time   WBC 11.3* 08/25/2012 0520   WBC 4.2 08/18/2012 1429   RBC 4.60 08/25/2012 0520   RBC 4.67 08/18/2012 1429   HGB 12.2* 08/25/2012 0520   HGB 13.0 08/18/2012 1429   HCT 35.7* 08/25/2012 0520   HCT 37.8* 08/18/2012 1429   PLT 238 08/25/2012 0520   PLT 310 08/18/2012 1429   MCV  77.6* 08/25/2012 0520   MCV 80.9 08/18/2012 1429   MCH 26.5 08/25/2012 0520   MCH 27.9 08/18/2012 1429   MCHC 34.2 08/25/2012 0520   MCHC 34.5 08/18/2012 1429   RDW 16.5* 08/25/2012 0520   RDW 16.4* 08/18/2012 1429   LYMPHSABS 2.5 08/25/2012 0520   LYMPHSABS 1.5 08/18/2012 1429   MONOABS 2.1* 08/25/2012 0520   MONOABS 0.1 08/18/2012 1429   EOSABS 0.1 08/25/2012 0520   EOSABS 0.1 08/18/2012 1429   BASOSABS 0.1 08/25/2012 0520   BASOSABS 0.0 08/18/2012 1429   CARDIAC ENZYMES Lab Results  Component Value Date   TROPONINI <0.30 06/15/2012    Scheduled Meds:   . calcium carbonate  1 tablet Oral TID  . ciprofloxacin  500 mg Oral BID  . furosemide  40 mg Oral Daily  . heparin  5,000 Units Subcutaneous Q8H  . lipase/protease/amylase  4 capsule Oral TID AC  . metroNIDAZOLE  500 mg Oral Q8H  . multivitamin with minerals  1 tablet Oral Daily  . pantoprazole  40 mg Oral Daily  . potassium chloride  20 mEq Oral BID   Continuous Infusions:   . dextrose 5 % and 0.45 % NaCl with KCl 10 mEq/L 10 mL/hr (08/23/12 1728)  . TPN (CLINIMIX) +/- additives     And  .  fat emulsion    . TPN (CLINIMIX) +/- additives 65 mL/hr at 08/24/12 1714   PRN Meds:.HYDROmorphone (DILAUDID) injection, LORazepam, ondansetron, ondansetron, oxyCODONE-acetaminophen, zolpidem  Assessment/Plan:  Patient Active Hospital Problem List:  Abdominal pain due to enteritis  Nausea and diarrhea due to above + possible pancreatic insufficiency  Papillary carcinoma(T3N1MX)  Hypertension  CAD  S/P Whipple procedure  S/P Pancreatic stent placement   Continue diet and activity. Home soon.   LOS: 7 days    Orpah Cobb  MD  08/25/2012, 2:17 PM

## 2012-08-25 NOTE — Progress Notes (Signed)
Pt ambulating in the halls, tolerate well. Pt c/o pain in LLQ states Oxy IR doesn't help his pain. Pt given dilaudid 1 mg. Family in visiting. Appetite very poor.

## 2012-08-26 ENCOUNTER — Other Ambulatory Visit: Payer: Medicare Other

## 2012-08-26 ENCOUNTER — Ambulatory Visit (HOSPITAL_COMMUNITY): Payer: Medicare Other

## 2012-08-26 ENCOUNTER — Other Ambulatory Visit: Payer: Medicare Other | Admitting: Adult Health

## 2012-08-26 LAB — BASIC METABOLIC PANEL
BUN: 10 mg/dL (ref 6–23)
Chloride: 95 mEq/L — ABNORMAL LOW (ref 96–112)
GFR calc Af Amer: >90 mL/min (ref >90)
Potassium: 3.3 mEq/L — ABNORMAL LOW (ref 3.5–5.1)

## 2012-08-26 MED ORDER — IOHEXOL 300 MG/ML  SOLN
100.0000 mL | Freq: Once | INTRAMUSCULAR | Status: AC | PRN
  Administered 2012-08-26: 100 mL via INTRAVENOUS

## 2012-08-26 MED ORDER — SODIUM BICARBONATE 8.4 % IV SOLN
50.0000 meq | Freq: Once | INTRAVENOUS | Status: AC
  Administered 2012-08-26: 50 meq via INTRAVENOUS
  Filled 2012-08-26: qty 50

## 2012-08-26 MED ORDER — POTASSIUM CHLORIDE IN NACL 40-0.9 MEQ/L-% IV SOLN
INTRAVENOUS | Status: DC
  Administered 2012-08-26: 13:00:00 via INTRAVENOUS
  Filled 2012-08-26 (×3): qty 1000

## 2012-08-26 MED ORDER — CLINIMIX E/DEXTROSE (5/20) 5 % IV SOLN
INTRAVENOUS | Status: AC
  Administered 2012-08-26: 17:00:00 via INTRAVENOUS
  Filled 2012-08-26: qty 2000

## 2012-08-26 MED ORDER — PROMETHAZINE HCL 25 MG/ML IJ SOLN
12.5000 mg | INTRAMUSCULAR | Status: DC | PRN
  Administered 2012-08-26: 12.5 mg via INTRAVENOUS
  Filled 2012-08-26: qty 1

## 2012-08-26 MED ORDER — ONDANSETRON HCL 4 MG/2ML IJ SOLN
4.0000 mg | Freq: Four times a day (QID) | INTRAMUSCULAR | Status: DC | PRN
  Administered 2012-08-26 – 2012-08-28 (×2): 4 mg via INTRAVENOUS
  Filled 2012-08-26 (×2): qty 2

## 2012-08-26 MED ORDER — BISMUTH SUBSALICYLATE 262 MG/15ML PO SUSP
30.0000 mL | Freq: Once | ORAL | Status: AC
  Administered 2012-08-26: 30 mL via ORAL
  Filled 2012-08-26: qty 236

## 2012-08-26 NOTE — Progress Notes (Signed)
PARENTERAL NUTRITION CONSULT NOTE - Follow Up  Pharmacy Consult for TNA Indication:  Bowel Rest for Small Bowel Enteritis   No Known Allergies  Patient Measurements: Height: 5\' 6"  (167.6 cm) Weight: 125 lb 3.5 oz (56.8 kg) IBW/kg (Calculated) : 63.8  Usual Weight: 155-156 lb  Vital Signs: Temp: 99.4 F (37.4 C) (01/07 0508) Temp src: Oral (01/07 0508) BP: 102/60 mmHg (01/07 0508) Pulse Rate: 101  (01/07 0508) Intake/Output from previous day: 01/06 0701 - 01/07 0700 In: 2767.7 [P.O.:600; I.V.:240; TPN:1927.7] Out: 1 [Stool:1] Intake/Output from this shift:   Labs:  [facility] 08/25/12 0520  WBC 11.3*  HGB 12.2*  HCT 35.7*  PLT 238  APTT --  INR --    Basename 08/25/12 0520 08/24/12 0753  NA 129* 132*  K 3.7 4.0  CL 99 101  CO2 20 21  GLUCOSE 132* 122*  BUN 9 9  CREATININE 0.65 0.70  LABCREA -- --  CREAT24HRUR -- --  CALCIUM 8.2* 8.3*  MG 1.7 --  PHOS 3.0 --  PROT 6.1 --  ALBUMIN 2.4* --  AST 29 --  ALT 78* --  ALKPHOS 138* --  BILITOT 0.2* --  BILIDIR -- --  IBILI -- --  PREALBUMIN 11.7* --  TRIG 121 --  CHOLHDL -- --  CHOL 93 --   Estimated Creatinine Clearance: 74 ml/min (by C-G formula based on Cr of 0.65).   No results found for this basename: GLUCAP:3 in the last 72 hours  Medications:  Scheduled:     . calcium carbonate  1 tablet Oral TID  . ciprofloxacin  500 mg Oral BID  . furosemide  40 mg Oral Daily  . heparin  5,000 Units Subcutaneous Q8H  . lipase/protease/amylase  4 capsule Oral TID AC  . metroNIDAZOLE  500 mg Oral Q8H  . multivitamin with minerals  1 tablet Oral Daily  . pantoprazole  40 mg Oral Daily  . potassium chloride  20 mEq Oral BID  . [DISCONTINUED] potassium chloride  20 mEq Oral Daily   Infusions:     . dextrose 5 % and 0.45 % NaCl with KCl 10 mEq/L 10 mL/hr (08/23/12 1728)  . TPN (CLINIMIX) +/- additives 65 mL/hr at 08/25/12 1701   And  . fat emulsion 250 mL (08/25/12 1701)  . [EXPIRED] TPN (CLINIMIX) +/-  additives 65 mL/hr at 08/24/12 1714    Insulin Requirements in the past 24 hours:  None ordered  Nutritional Goals:  Per 12/31 RD note: Kcal: 1750-1950,  Protein: 70-90g,  Fluid: > 1.9L/day  Goal TNA: Clinimix E 5/20 @ 88ml/hr + 20% IV lipids on MWF will provide 96 g protein/day and 1896 kcal/day avg (1689 kcal on non-lipid days, 2169 kcal on lipid days)  Current Nutrition:  Regular diet; fat modified not tolerated and patient changed back to clear liquid diet 08/26/12 AM IVF: D5-1/2NS+10K @ 61ml/hr TNA: Clinimix E5/20 @ 65 ml/hr, Lipids MWF only  Assessment: 67 yo M with pancreatic cancer admitted 08/18/12 with CC: abdominal pain. Pt is s/p Whipple on 06/06/12 and s/p chemo on 08/14/12. Prior to admission, patient reported 10-15 days of abdominal pain, diarrhea, nausea, and vomiting.  Pt was started on TNA via PAC for nutritional support 1/3. Per RD note, patient is classified as severely malnourished with ~16% weight loss (25 lbs) in the past 2 months and poor PO intake ~ 3 days prior to admission.   Labs  Chem: Na remains low (cannot adjust Na+ conc of TNA). Corrected Ca wnl,  Lipid Profile: TG 121  Prealbumin 9, low, not representative of full nutritional support. Indicates depleted protein stores at baseline.  LFT: AST/ALT decreased, Alk Phos slightlly elevated, stable   Plan  Patient could not tolerate diet yesterday, changed to liquid diet today, will not advance or wean TNA today. Continue Clinimix E 5/20 at 57ml/hr, continue MIVF @ KVO. MD please enter order when patient is ready to wean off TNA  Lipids to be given on MWF only due to national shortage  Multivitamin and trace elements to be provided orally since patient currently tolerating other oral meds  Tammy Sours, Pharm.D. Clinical Oncology Pharmacist  Pager # [phone]  08/26/2012 8:06 AM

## 2012-08-26 NOTE — Progress Notes (Signed)
Subjective:  Increased nausea, vomiting. T max 99.4 F. Ct abdomen-improved distal small bowel thickening has resolved. New proximal large bowel thickening. + diarrhea. Slept good with IV dilaudid  Objective:  Vital Signs in the last 24 hours: Temp:  [97.6 F (36.4 C)-99.4 F (37.4 C)] 99 F (37.2 C) (01/07 1400) Pulse Rate:  [92-101] 92  (01/07 1400) Cardiac Rhythm:  [-]  Resp:  [16-20] 20  (01/07 0501) BP: (100-118)/(58-63) 100/58 mmHg (01/07 1354) SpO2:  [94 %-97 %] 97 % (01/07 1400)  Physical Exam: BP Readings from Last 1 Encounters:  08/26/12 100/58    Wt Readings from Last 1 Encounters:  08/23/12 56.8 kg (125 lb 3.5 oz)    Weight change:   HEENT: [place]/AT, Eyes-Brown, PERL, EOMI, Conjunctiva-Pale pink, Sclera-Non-icteric Neck: No JVD, No bruit, Trachea midline. Lungs:  Clear, Bilateral. Cardiac:  Regular rhythm, normal S1 and S2, no S3.  Abdomen:  Soft, non-tender. Extremities:  No edema present. No cyanosis. No clubbing. CNS: AxOx3, Cranial nerves grossly intact, moves all 4 extremities. Right handed. Skin: Warm and dry.   Intake/Output from previous day: 01/06 0701 - 01/07 0700 In: 2767.7 [P.O.:600; I.V.:240; TPN:1927.7] Out: 1 [Stool:1]    Lab Results: BMET    Component Value Date/Time   NA 127* 08/26/2012 0925   NA 135* 08/18/2012 1429   K 3.3* 08/26/2012 0925   K 4.2 08/18/2012 1429   CL 95* 08/26/2012 0925   CL 102 08/18/2012 1429   CO2 17* 08/26/2012 0925   CO2 21* 08/18/2012 1429   GLUCOSE 148* 08/26/2012 0925   GLUCOSE 129* 08/18/2012 1429   BUN 10 08/26/2012 0925   BUN 13.0 08/18/2012 1429   CREATININE 0.69 08/26/2012 0925   CREATININE 0.9 08/18/2012 1429   CALCIUM 8.5 08/26/2012 0925   CALCIUM 9.5 08/18/2012 1429   GFRNONAA >90 08/26/2012 0925   GFRAA >90 08/26/2012 0925   CBC    Component Value Date/Time   WBC 11.3* 08/25/2012 0520   WBC 4.2 08/18/2012 1429   RBC 4.60 08/25/2012 0520   RBC 4.67 08/18/2012 1429   HGB 12.2* 08/25/2012 0520   HGB 13.0 08/18/2012  1429   HCT 35.7* 08/25/2012 0520   HCT 37.8* 08/18/2012 1429   PLT 238 08/25/2012 0520   PLT 310 08/18/2012 1429   MCV 77.6* 08/25/2012 0520   MCV 80.9 08/18/2012 1429   MCH 26.5 08/25/2012 0520   MCH 27.9 08/18/2012 1429   MCHC 34.2 08/25/2012 0520   MCHC 34.5 08/18/2012 1429   RDW 16.5* 08/25/2012 0520   RDW 16.4* 08/18/2012 1429   LYMPHSABS 2.5 08/25/2012 0520   LYMPHSABS 1.5 08/18/2012 1429   MONOABS 2.1* 08/25/2012 0520   MONOABS 0.1 08/18/2012 1429   EOSABS 0.1 08/25/2012 0520   EOSABS 0.1 08/18/2012 1429   BASOSABS 0.1 08/25/2012 0520   BASOSABS 0.0 08/18/2012 1429   CARDIAC ENZYMES Lab Results  Component Value Date   TROPONINI <0.30 06/15/2012    Scheduled Meds:   . calcium carbonate  1 tablet Oral TID  . furosemide  40 mg Oral Daily  . heparin  5,000 Units Subcutaneous Q8H  . lipase/protease/amylase  4 capsule Oral TID AC  . metroNIDAZOLE  500 mg Oral Q8H  . multivitamin with minerals  1 tablet Oral Daily  . pantoprazole  40 mg Oral Daily  . potassium chloride  20 mEq Oral BID  . sodium bicarbonate  50 mEq Intravenous Once   Continuous Infusions:   . 0.9 % NaCl with KCl  40 mEq / L 75 mL/hr at 08/26/12 1255  . TPN (CLINIMIX) +/- additives 65 mL/hr at 08/25/12 1701   And  . fat emulsion 250 mL (08/25/12 1701)  . TPN (CLINIMIX) +/- additives     PRN Meds:.HYDROmorphone (DILAUDID) injection, LORazepam, ondansetron, ondansetron, ondansetron, oxyCODONE-acetaminophen, zolpidem  Assessment/Plan:  Patient Active Hospital Problem List:  Abdominal pain due to enteritis-resolved Abdominal pain due to Colitis-new  Nausea and diarrhea due to above + possible pancreatic insufficiency  Papillary carcinoma(T3N1MX)  Hypertension  CAD  S/P Whipple procedure  S/P Pancreatic stent placement  Insomnia Anxiety  Back to Clear liquid. C. difficile by PCR. DC Cipro    LOS: 8 days    Orpah Cobb  MD  08/26/2012, 3:45 PM

## 2012-08-26 NOTE — Progress Notes (Signed)
Pt's diarrhea has continued, and he is requesting something to help with this.

## 2012-08-26 NOTE — Progress Notes (Signed)
Patient ID: Colin Castro, male   DOB: Sep 01, 1945, 67 y.o.   MRN: [id number]    Subjective: Pt had very bad night with increased n/v/d.  + delirium. Not sleeping.    Objective: Vital signs in last 24 hours: Temp:  [97.6 F (36.4 C)-99.4 F (37.4 C)] 99.4 F (37.4 C) (01/07 0508) Pulse Rate:  [87-101] 101  (01/07 0508) Resp:  [16-20] 20  (01/07 0501) BP: (102-118)/(58-63) 102/60 mmHg (01/07 0508) SpO2:  [94 %-97 %] 94 % (01/07 0501) Last BM Date: 08/25/12  Intake/Output from previous day: 01/06 0701 - 01/07 0700 In: 2767.7 [P.O.:600; I.V.:240; TPN:1927.7] Out: 1 [Stool:1] Intake/Output this shift:    General appearance: alert and no distress GI: soft, non tender,  non distended. Extremities: extremities normal, atraumatic, no cyanosis or edema  Lab Results:   [facility] 08/25/12 0520  WBC 11.3*  HGB 12.2*  HCT 35.7*  PLT 238   BMET  Basename 08/25/12 0520 08/24/12 0753  NA 129* 132*  K 3.7 4.0  CL 99 101  CO2 20 21  GLUCOSE 132* 122*  BUN 9 9  CREATININE 0.65 0.70  CALCIUM 8.2* 8.3*   PT/INR No results found for this basename: LABPROT:2,INR:2 in the last 72 hours ABG No results found for this basename: PHART:2,PCO2:2,PO2:2,HCO3:2 in the last 72 hours  Studies/Results: No results found.  Anti-infectives: Anti-infectives     Start     Dose/Rate Route Frequency Ordered Stop   08/21/12 2000   ciprofloxacin (CIPRO) tablet 500 mg        500 mg Oral 2 times daily 08/21/12 1424     08/19/12 0100   ciprofloxacin (CIPRO) IVPB 400 mg  Status:  Discontinued        400 mg 200 mL/hr over 60 Minutes Intravenous 2 times daily 08/19/12 0051 08/21/12 1423   08/19/12 0100   metroNIDAZOLE (FLAGYL) tablet 500 mg        500 mg Oral 3 times per day 08/19/12 0051     08/18/12 2330   Ampicillin-Sulbactam (UNASYN) 3 g in sodium chloride 0.9 % 100 mL IVPB        3 g 100 mL/hr over 60 Minutes Intravenous  Once 08/18/12 2315 08/19/12 0100          Assessment/Plan: s/p * No  surgery found *  Change back to clear liquids.   Repeat CT If enteritis appears improved, would consider d/c antibiotics.   ? Source of diarrhea.  Antibiotic side effect, enteritis, panc insufficiency or viral/ c. Diff. Delirium likely secondary to lack of sleep.   LOS: 8 days    [facility] 08/26/2012

## 2012-08-27 ENCOUNTER — Inpatient Hospital Stay (HOSPITAL_COMMUNITY): Payer: Medicare Other

## 2012-08-27 LAB — CBC
HCT: 30.4 % — ABNORMAL LOW (ref 39.0–52.0)
Hemoglobin: 10.4 g/dL — ABNORMAL LOW (ref 13.0–17.0)
MCHC: 34.2 g/dL (ref 30.0–36.0)
MCV: 78.1 fL (ref 78.0–100.0)

## 2012-08-27 LAB — BASIC METABOLIC PANEL
BUN: 13 mg/dL (ref 6–23)
CO2: 22 mEq/L (ref 19–32)
Chloride: 102 mEq/L (ref 96–112)
GFR calc Af Amer: >90 mL/min (ref >90)
Glucose, Bld: 106 mg/dL — ABNORMAL HIGH (ref 70–99)
Potassium: 3.1 mEq/L — ABNORMAL LOW (ref 3.5–5.1)

## 2012-08-27 LAB — CLOSTRIDIUM DIFFICILE BY PCR: Toxigenic C. Difficile by PCR: NEGATIVE

## 2012-08-27 MED ORDER — FUROSEMIDE 10 MG/ML IJ SOLN
40.0000 mg | Freq: Once | INTRAMUSCULAR | Status: AC
  Administered 2012-08-27: 40 mg via INTRAVENOUS
  Filled 2012-08-27: qty 4

## 2012-08-27 MED ORDER — ACETAMINOPHEN 325 MG PO TABS
650.0000 mg | ORAL_TABLET | Freq: Four times a day (QID) | ORAL | Status: DC | PRN
  Administered 2012-08-27 – 2012-08-30 (×5): 650 mg via ORAL
  Filled 2012-08-27 (×5): qty 2

## 2012-08-27 MED ORDER — CLINIMIX E/DEXTROSE (5/20) 5 % IV SOLN
INTRAVENOUS | Status: AC
  Filled 2012-08-27: qty 2000

## 2012-08-27 MED ORDER — SODIUM CHLORIDE 0.9 % IV SOLN
Freq: Once | INTRAVENOUS | Status: AC
  Administered 2012-08-27: 18:00:00 via INTRAVENOUS

## 2012-08-27 MED ORDER — CLINIMIX E/DEXTROSE (5/20) 5 % IV SOLN
INTRAVENOUS | Status: DC
  Administered 2012-08-27: 17:00:00 via INTRAVENOUS
  Filled 2012-08-27: qty 2000

## 2012-08-27 MED ORDER — SODIUM CHLORIDE 0.9 % IV SOLN
Freq: Once | INTRAVENOUS | Status: AC
  Administered 2012-08-27: 19:00:00 via INTRAVENOUS

## 2012-08-27 MED ORDER — PIPERACILLIN-TAZOBACTAM 3.375 G IVPB
3.3750 g | Freq: Three times a day (TID) | INTRAVENOUS | Status: DC
  Administered 2012-08-27 – 2012-08-29 (×6): 3.375 g via INTRAVENOUS
  Filled 2012-08-27 (×7): qty 50

## 2012-08-27 MED ORDER — ALBUMIN HUMAN 5 % IV SOLN
12.5000 g | Freq: Once | INTRAVENOUS | Status: AC
  Administered 2012-08-27: 12.5 g via INTRAVENOUS
  Filled 2012-08-27: qty 250

## 2012-08-27 MED ORDER — SODIUM CHLORIDE 0.9 % IV SOLN
Freq: Once | INTRAVENOUS | Status: AC
  Administered 2012-08-27: 17:00:00 via INTRAVENOUS

## 2012-08-27 MED ORDER — IOHEXOL 350 MG/ML SOLN
100.0000 mL | Freq: Once | INTRAVENOUS | Status: AC | PRN
  Administered 2012-08-27: 100 mL via INTRAVENOUS

## 2012-08-27 MED ORDER — SODIUM CHLORIDE 0.9 % IV BOLUS (SEPSIS)
250.0000 mL | Freq: Once | INTRAVENOUS | Status: AC
  Administered 2012-08-27: 250 mL via INTRAVENOUS

## 2012-08-27 MED ORDER — KCL IN DEXTROSE-NACL 20-5-0.9 MEQ/L-%-% IV SOLN
INTRAVENOUS | Status: DC
  Administered 2012-08-28: 50 mL/h via INTRAVENOUS
  Administered 2012-08-29: 08:00:00 via INTRAVENOUS
  Filled 2012-08-27 (×4): qty 1000

## 2012-08-27 MED ORDER — FAT EMULSION 20 % IV EMUL
250.0000 mL | INTRAVENOUS | Status: AC
  Filled 2012-08-27: qty 250

## 2012-08-27 MED ORDER — SODIUM CHLORIDE 0.9 % IV SOLN: Freq: Once | INTRAVENOUS | Status: DC

## 2012-08-27 MED ORDER — FAT EMULSION 20 % IV EMUL
250.0000 mL | INTRAVENOUS | Status: DC
  Administered 2012-08-27: 250 mL via INTRAVENOUS
  Filled 2012-08-27: qty 250

## 2012-08-27 MED ORDER — SODIUM BICARBONATE 8.4 % IV SOLN
50.0000 meq | Freq: Once | INTRAVENOUS | Status: AC
  Administered 2012-08-27: 50 meq via INTRAVENOUS
  Filled 2012-08-27: qty 50

## 2012-08-27 MED ORDER — POTASSIUM CHLORIDE 10 MEQ/100ML IV SOLN
10.0000 meq | INTRAVENOUS | Status: AC
  Administered 2012-08-27 – 2012-08-28 (×4): 10 meq via INTRAVENOUS
  Filled 2012-08-27 (×4): qty 100

## 2012-08-27 MED ORDER — POTASSIUM CHLORIDE CRYS ER 20 MEQ PO TBCR
30.0000 meq | EXTENDED_RELEASE_TABLET | Freq: Three times a day (TID) | ORAL | Status: DC
  Administered 2012-08-28 (×3): 30 meq via ORAL
  Filled 2012-08-27 (×8): qty 1

## 2012-08-27 NOTE — Progress Notes (Signed)
PARENTERAL NUTRITION CONSULT NOTE - Follow Up  Pharmacy Consult for TNA Indication:  Bowel Rest for Small Bowel Enteritis   No Known Allergies  Patient Measurements: Height: 5\' 6"  (167.6 cm) Weight: 134 lb 9.6 oz (61.054 kg) IBW/kg (Calculated) : 63.8  Usual Weight: 155-156 lb  Vital Signs: Temp: 98 F (36.7 C) (01/08 0658) Temp src: Oral (01/08 0658) BP: 133/74 mmHg (01/08 0658) Pulse Rate: 108  (01/08 0658) Intake/Output from previous day: 01/07 0701 - 01/08 0700 In: 3461.3 [P.O.:480; I.V.:1356.3; TPN:1625] Out: 5 [Urine:2; Stool:3] Intake/Output from this shift:   Labs:  [facility] 08/25/12 0520  WBC 11.3*  HGB 12.2*  HCT 35.7*  PLT 238  APTT --  INR --    Basename 08/26/12 0925 08/25/12 0520  NA 127* 129*  K 3.3* 3.7  CL 95* 99  CO2 17* 20  GLUCOSE 148* 132*  BUN 10 9  CREATININE 0.69 0.65  LABCREA -- --  CREAT24HRUR -- --  CALCIUM 8.5 8.2*  MG -- 1.7  PHOS -- 3.0  PROT -- 6.1  ALBUMIN -- 2.4*  AST -- 29  ALT -- 78*  ALKPHOS -- 138*  BILITOT -- 0.2*  BILIDIR -- --  IBILI -- --  PREALBUMIN -- 11.7*  TRIG -- 121  CHOLHDL -- --  CHOL -- 93   Estimated Creatinine Clearance: 79.6 ml/min (by C-G formula based on Cr of 0.69).   No results found for this basename: GLUCAP:3 in the last 72 hours  Medications:  Scheduled:     . [COMPLETED] bismuth subsalicylate  30 mL Oral Once  . calcium carbonate  1 tablet Oral TID  . furosemide  40 mg Oral Daily  . heparin  5,000 Units Subcutaneous Q8H  . lipase/protease/amylase  4 capsule Oral TID AC  . metroNIDAZOLE  500 mg Oral Q8H  . multivitamin with minerals  1 tablet Oral Daily  . pantoprazole  40 mg Oral Daily  . potassium chloride  20 mEq Oral BID  . [COMPLETED] sodium bicarbonate  50 mEq Intravenous Once  . [DISCONTINUED] ciprofloxacin  500 mg Oral BID   Infusions:     . 0.9 % NaCl with KCl 40 mEq / L 75 mL/hr at 08/26/12 1255  . [EXPIRED] TPN (CLINIMIX) +/- additives 65 mL/hr at 08/25/12 1701     And  . [EXPIRED] fat emulsion 250 mL (08/25/12 1701)  . TPN (CLINIMIX) +/- additives 65 mL/hr at 08/26/12 1717  . [DISCONTINUED] dextrose 5 % and 0.45 % NaCl with KCl 10 mEq/L 10 mL/hr (08/23/12 1728)    Insulin Requirements in the past 24 hours:  None ordered  Nutritional Goals:  Per 12/31 RD note: Kcal: 1750-1950,  Protein: 70-90g,  Fluid: > 1.9L/day  Goal TNA: Clinimix E 5/20 @ 47ml/hr + 20% IV lipids on MWF will provide 96 g protein/day and 1896 kcal/day avg (1689 kcal on non-lipid days, 2169 kcal on lipid days)  Current Nutrition:  Regular diet; fat modified not tolerated and patient changed back to clear liquid diet 08/26/12 AM IVF: NS with 40 mEq of K+ @ 75 ml/hr TNA: Clinimix E5/20 @ 65 ml/hr, Lipids MWF only  Assessment: 67 yo M with pancreatic cancer admitted 08/18/12 with CC: abdominal pain. Pt is s/p Whipple on 06/06/12 and s/p chemo on 08/14/12. Prior to admission, patient reported 10-15 days of abdominal pain, diarrhea, nausea, and vomiting.  Pt was started on TNA via PAC for nutritional support 1/3. Per RD note, patient is classified as severely malnourished with ~  16% weight loss (25 lbs) in the past 2 months and poor PO intake ~ 3 days prior to admission.   Labs  Chem: Na remains low (cannot adjust Na+ conc of TNA). Corrected Ca wnl,   BMET ordered later in the day on 1/7 - K+ was low, MD changed MIVF and added additional K+ in fluids - No repeat labs 1/8  NO BMET/CMET ON CBC ON 1/8  Lipid Profile: TG 121 (1/6)  Prealbumin 9 (1/4), 11.7 (1/6)  LFT: AST/ALT decreased, Alk Phos slightlly elevated, stable   Plan  Patient could not tolerate diet 1/6, changed to liquid diet 1/7  Will advance TNA to goal rate of 80 ml/hr.  Defer managament of mIVF to MD (fluids changed and increased to NS with K at 75 ml/hr on 1/7 per MD)  MD please enter order when patient is ready to wean off TNA  BMET ordered later in the day  Lipids to be given on MWF only due to  national shortage  Multivitamin and trace elements to be provided orally since patient currently tolerating other oral meds  Tammy Sours, Pharm.D. Clinical Oncology Pharmacist  Pager # [phone]  08/27/2012 11:11 AM

## 2012-08-27 NOTE — Progress Notes (Signed)
Patient ID: Colin Castro, male   DOB: 03-03-1946, 67 y.o.   MRN: [id number]    Subjective: Abd much better, but now very short of breath.    Objective: Vital signs in last 24 hours: Temp:  [98 F (36.7 C)-99.5 F (37.5 C)] 98.4 F (36.9 C) (01/08 1312) Pulse Rate:  [92-108] 108  (01/08 1312) Resp:  [20-24] 22  (01/08 1312) BP: (113-133)/(52-74) 113/52 mmHg (01/08 1312) SpO2:  [95 %-100 %] 95 % (01/08 1312) Weight:  [134 lb 9.6 oz (61.054 kg)] 134 lb 9.6 oz (61.054 kg) (01/08 0500) Last BM Date: 08/26/12  Intake/Output from previous day: 01/07 0701 - 01/08 0700 In: 3461.3 [P.O.:480; I.V.:1356.3; TPN:1625] Out: 5 [Urine:2; Stool:3] Intake/Output this shift:    General appearance: alert and no distress GI: soft, non tender,  non distended. Extremities: extremities normal, atraumatic, no cyanosis or edema  Lab Results:   [facility] 08/25/12 0520  WBC 11.3*  HGB 12.2*  HCT 35.7*  PLT 238   BMET  Basename 08/26/12 0925 08/25/12 0520  NA 127* 129*  K 3.3* 3.7  CL 95* 99  CO2 17* 20  GLUCOSE 148* 132*  BUN 10 9  CREATININE 0.69 0.65  CALCIUM 8.5 8.2*   PT/INR No results found for this basename: LABPROT:2,INR:2 in the last 72 hours ABG No results found for this basename: PHART:2,PCO2:2,PO2:2,HCO3:2 in the last 72 hours  Studies/Results: Ct Abdomen Pelvis W Contrast  08/26/2012  *RADIOLOGY REPORT*  Clinical Data: Abdominal pain with vomiting and diarrhea on antibiotics.  Evaluate enteritis.  History of pancreatic cancer.  CT ABDOMEN AND PELVIS WITH CONTRAST  Technique:  Multidetector CT imaging of the abdomen and pelvis was performed following the standard protocol during bolus administration of intravenous contrast.  Contrast: OMNIPAQUE IOHEXOL 300 MG/ML  SOLN  Comparison: Radiographs 08/21/2012.  CT 08/18/2012 and 07/25/2012.  Findings: There is residual asymmetric right lower lobe air space disease associated with central airway thickening, suspicious for possible  aspiration.  The left basilar aeration has improved. There is no significant pleural effusion.  No residual hepatic abnormality is identified.  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  A small amount of pneumobilia remains status post cholecystectomy and partial pancreatic resection.  There is no biliary dilatation. The pancreas, spleen and adrenal glands otherwise appear unremarkable.  There are stable low-density renal lesions bilaterally.  The gastrojejunostomy appears stable.  There is no small bowel dilatation or significant residual wall thickening.  However, there is mild diffuse colonic wall thickening, especially within the right and proximal transverse colon.  There is no evidence of bowel obstruction or focal extraluminal fluid collection. Postsurgical changes within the omental fat deep to the umbilicus are stable.  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 The urinary bladder, prostate gland and seminal vesicles appear normal.  There are no worrisome osseous findings.  IMPRESSION:  1.  New mild diffuse colonic wall thickening, especially proximally, suspicious for colitis (likely pseudomembranous in this clinical context).  Distal small bowel wall thickening noted previously has resolved. 2.  No evidence of bowel obstruction, extraluminal fluid collection or metastatic disease. 3.  Persistent asymmetric right lower lobe air space disease suspicious for possible aspiration. 4.  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   Original Report Authenticated By: Carey Bullocks, M.D.     Anti-infectives: Anti-infectives     Start  Dose/Rate Route Frequency Ordered Stop   08/21/12 2000   ciprofloxacin (CIPRO) tablet 500 mg  Status:  Discontinued        500 mg Oral 2 times daily 08/21/12 1424 08/26/12  1544   08/19/12 0100   ciprofloxacin (CIPRO) IVPB 400 mg  Status:  Discontinued        400 mg 200 mL/hr over 60 Minutes Intravenous 2 times daily 08/19/12 0051 08/21/12 1423   08/19/12 0100   metroNIDAZOLE (FLAGYL) tablet 500 mg        500 mg Oral 3 times per day 08/19/12 0051     08/18/12 2330   Ampicillin-Sulbactam (UNASYN) 3 g in sodium chloride 0.9 % 100 mL IVPB        3 g 100 mL/hr over 60 Minutes Intravenous  Once 08/18/12 2315 08/19/12 0100          Assessment/Plan: s/p * No surgery found *  Clear liquids.   CT with no enteritis, now with colitis.   On PO flagyl.    Diarrhea likely secondary to colitis.  C diff negative x 1.  Will repeat.    CXR pending.  Dr. Algie Coffer on the way to eval. Consider chest CT for PE.     LOS: 9 days    [facility] 08/27/2012

## 2012-08-27 NOTE — Progress Notes (Signed)
ANTIBIOTIC CONSULT NOTE - INITIAL  Pharmacy Consult for Zosyn Indication: Fever/Colitis  No Known Allergies  Patient Measurements: Height: 5\' 6"  (167.6 cm) Weight: 134 lb 9.6 oz (61.054 kg) IBW/kg (Calculated) : 63.8    Vital Signs: Temp: 99.2 F (37.3 C) (01/08 1655) Temp src: Oral (01/08 1655) BP: 72/56 mmHg (01/08 1655) Pulse Rate: 116  (01/08 1655) Intake/Output from previous day: 01/07 0701 - 01/08 0700 In: 3461.3 [P.O.:480; I.V.:1356.3; TPN:1625] Out: 5 [Urine:2; Stool:3] Intake/Output from this shift:    Labs:  Basename 08/26/12 0925 08/25/12 0520  WBC -- 11.3*  HGB -- 12.2*  PLT -- 238  LABCREA -- --  CREATININE 0.69 0.65   Estimated Creatinine Clearance: 79.6 ml/min (by C-G formula based on Cr of 0.69).    Microbiology: Recent Results (from the past 720 hour(s))  TECHNOLOGIST REVIEW     Status: Normal   Collection Time   08/14/12  2:01 PM      Component Value Range Status Comment   Technologist Review Oc meta   Final   MRSA PCR SCREENING     Status: Normal   Collection Time   08/19/12  4:12 AM      Component Value Range Status Comment   MRSA by PCR NEGATIVE  NEGATIVE Final   CLOSTRIDIUM DIFFICILE BY PCR     Status: Normal   Collection Time   08/26/12 10:41 PM      Component Value Range Status Comment   C difficile by pcr NEGATIVE  NEGATIVE Final     Medical History: Past Medical History  Diagnosis Date  . Hypertension   . High cholesterol   . Coronary artery disease   . Common bile duct (CBD) stricture     Malignant  . 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 Pneumonia   . Constipation   . Myocardial infarction     15 years ago    Medications:  Anti-infectives     Start     Dose/Rate Route Frequency Ordered Stop   08/27/12 1800  piperacillin-tazobactam (ZOSYN) IVPB 3.375 g       3.375 g 12.5 mL/hr over 240 Minutes Intravenous Every 8 hours 08/27/12 1722     08/21/12 2000   ciprofloxacin (CIPRO) tablet 500 mg  Status:  Discontinued        500 mg Oral 2 times daily 08/21/12 1424 08/26/12 1544   08/19/12 0100   ciprofloxacin (CIPRO) IVPB 400 mg  Status:  Discontinued        400 mg 200 mL/hr over 60 Minutes Intravenous 2 times daily 08/19/12 0051 08/21/12 1423   08/19/12 0100   metroNIDAZOLE (FLAGYL) tablet 500 mg        500 mg Oral 3 times per day 08/19/12 0051     08/18/12 2330   Ampicillin-Sulbactam (UNASYN) 3 g in sodium chloride 0.9 % 100 mL IVPB        3 g 100 mL/hr over 60 Minutes Intravenous  Once 08/18/12 2315 08/19/12 0100         Assessment:  66 YOM w/ hx papillary carcinoma, s/p whipple and pancreatic stent placement (06/06/12) admitted 08/18/12 w/ abd pain  Tm 99.2, shortness of breath, MD ordered Zosyn Per Rx  CXR negative for cardiopulmonary disease, CT pending  Pt also on flagyl (D9), cdiff negative 1/7   Bcx in process  Goal of Therapy:  Resolution of infx  Plan:   Zosyn 3.375g  IV q8h, each dose over 4 hours  Recommend D/C Flagyl - Duplicate Tx, no Cdiff  Follow labs vitals and cx  Adjust dose as necessary  Gwen Her PharmD  [phone] 08/27/2012 7:56 PM

## 2012-08-27 NOTE — Progress Notes (Signed)
Subjective:  No new complaints. Abdominal pain resolving.  T max 99.5 F  Objective:  Vital Signs in the last 24 hours: Temp:  [98 F (36.7 C)-99.5 F (37.5 C)] 98 F (36.7 C) (01/08 0658) Pulse Rate:  [92-108] 108  (01/08 0658) Cardiac Rhythm:  [-]  Resp:  [20-24] 20  (01/08 0658) BP: (100-133)/(57-74) 133/74 mmHg (01/08 0658) SpO2:  [96 %-100 %] 100 % (01/08 0658) Weight:  [61.054 kg (134 lb 9.6 oz)] 61.054 kg (134 lb 9.6 oz) (01/08 0500)  Physical Exam: BP Readings from Last 1 Encounters:  08/27/12 133/74    Wt Readings from Last 1 Encounters:  08/27/12 61.054 kg (134 lb 9.6 oz)    Weight change:   HEENT: [place]/AT, Eyes-Brown, PERL, EOMI, Conjunctiva-Pale pink, Sclera-Non-icteric Neck: No JVD, No bruit, Trachea midline. Lungs:  Clear, Bilateral. Cardiac:  Regular rhythm, normal S1 and S2, no S3.  Abdomen:  Soft, non-tender. Extremities:  No edema present. No cyanosis. No clubbing. CNS: AxOx3, Cranial nerves grossly intact, moves all 4 extremities. Right handed. Skin: Warm and dry.   Intake/Output from previous day: 01/07 0701 - 01/08 0700 In: 3461.3 [P.O.:480; I.V.:1356.3; TPN:1625] Out: 5 [Urine:2; Stool:3]    Lab Results: BMET    Component Value Date/Time   NA 127* 08/26/2012 0925   NA 135* 08/18/2012 1429   K 3.3* 08/26/2012 0925   K 4.2 08/18/2012 1429   CL 95* 08/26/2012 0925   CL 102 08/18/2012 1429   CO2 17* 08/26/2012 0925   CO2 21* 08/18/2012 1429   GLUCOSE 148* 08/26/2012 0925   GLUCOSE 129* 08/18/2012 1429   BUN 10 08/26/2012 0925   BUN 13.0 08/18/2012 1429   CREATININE 0.69 08/26/2012 0925   CREATININE 0.9 08/18/2012 1429   CALCIUM 8.5 08/26/2012 0925   CALCIUM 9.5 08/18/2012 1429   GFRNONAA >90 08/26/2012 0925   GFRAA >90 08/26/2012 0925   CBC    Component Value Date/Time   WBC 11.3* 08/25/2012 0520   WBC 4.2 08/18/2012 1429   RBC 4.60 08/25/2012 0520   RBC 4.67 08/18/2012 1429   HGB 12.2* 08/25/2012 0520   HGB 13.0 08/18/2012 1429   HCT 35.7* 08/25/2012 0520   HCT 37.8* 08/18/2012 1429   PLT 238 08/25/2012 0520   PLT 310 08/18/2012 1429   MCV 77.6* 08/25/2012 0520   MCV 80.9 08/18/2012 1429   MCH 26.5 08/25/2012 0520   MCH 27.9 08/18/2012 1429   MCHC 34.2 08/25/2012 0520   MCHC 34.5 08/18/2012 1429   RDW 16.5* 08/25/2012 0520   RDW 16.4* 08/18/2012 1429   LYMPHSABS 2.5 08/25/2012 0520   LYMPHSABS 1.5 08/18/2012 1429   MONOABS 2.1* 08/25/2012 0520   MONOABS 0.1 08/18/2012 1429   EOSABS 0.1 08/25/2012 0520   EOSABS 0.1 08/18/2012 1429   BASOSABS 0.1 08/25/2012 0520   BASOSABS 0.0 08/18/2012 1429   CARDIAC ENZYMES Lab Results  Component Value Date   TROPONINI <0.30 06/15/2012    Scheduled Meds:   . calcium carbonate  1 tablet Oral TID  . furosemide  40 mg Intravenous Once  . heparin  5,000 Units Subcutaneous Q8H  . lipase/protease/amylase  4 capsule Oral TID AC  . metroNIDAZOLE  500 mg Oral Q8H  . multivitamin with minerals  1 tablet Oral Daily  . pantoprazole  40 mg Oral Daily  . potassium chloride  30 mEq Oral TID  . sodium bicarbonate  50 mEq Intravenous Once   Continuous Infusions:   . 0.9 % NaCl with KCl  40 mEq / L 75 mL/hr at 08/26/12 1255  . TPN (CLINIMIX) +/- additives     And  . fat emulsion    . TPN (CLINIMIX) +/- additives 65 mL/hr at 08/26/12 1717   PRN Meds:.HYDROmorphone (DILAUDID) injection, LORazepam, ondansetron, ondansetron, ondansetron, oxyCODONE-acetaminophen, zolpidem  Assessment/Plan:  Patient Active Hospital Problem List: Abdominal pain due to enteritis-resolved  Abdominal pain due to Colitis-new  Nausea and diarrhea due to above + possible pancreatic insufficiency  Papillary carcinoma(T3N1MX)  Hypertension  CAD  S/P Whipple procedure  S/P Pancreatic stent placement  Insomnia  Anxiety Hypokalemia Hyponatremia  Increase diet to full liquid. Increase activity. Increase K+ supplement    LOS: 9 days    Orpah Cobb  MD  08/27/2012, 12:26 PM

## 2012-08-27 NOTE — Progress Notes (Signed)
Subjective:  Short of breath and fever. CXR neg for fluid overload or pneumonia. T max 99.5 F  Objective:  Vital Signs in the last 24 hours: Temp:  [98 F (36.7 C)-99.5 F (37.5 C)] 99.2 F (37.3 C) (01/08 1655) Pulse Rate:  [92-116] 116  (01/08 1655) Cardiac Rhythm:  [-]  Resp:  [20-29] 29  (01/08 1655) BP: (72-133)/(52-74) 72/56 mmHg (01/08 1655) SpO2:  [95 %-100 %] 96 % (01/08 1655) Weight:  [61.054 kg (134 lb 9.6 oz)] 61.054 kg (134 lb 9.6 oz) (01/08 0500)  Physical Exam: BP Readings from Last 1 Encounters:  08/27/12 72/56    Wt Readings from Last 1 Encounters:  08/27/12 61.054 kg (134 lb 9.6 oz)    Weight change:   HEENT: [place]/AT, Eyes-Brown, PERL, EOMI, Conjunctiva-Pale pink, Sclera-Non-icteric. Tongue dry Neck: No JVD, No bruit, Trachea midline. Lungs:  Clear, Bilateral. Cardiac:  Regular rhythm, normal S1 and S2, no S3.  Abdomen:  Soft, non-tender. Extremities:  No edema present. No cyanosis. No clubbing. CNS: AxOx3, Cranial nerves grossly intact, moves all 4 extremities. Right handed. Skin: Warm and dry.   Intake/Output from previous day: 01/07 0701 - 01/08 0700 In: 3461.3 [P.O.:480; I.V.:1356.3; TPN:1625] Out: 5 [Urine:2; Stool:3]    Lab Results: BMET    Component Value Date/Time   NA 127* 08/26/2012 0925   NA 135* 08/18/2012 1429   K 3.3* 08/26/2012 0925   K 4.2 08/18/2012 1429   CL 95* 08/26/2012 0925   CL 102 08/18/2012 1429   CO2 17* 08/26/2012 0925   CO2 21* 08/18/2012 1429   GLUCOSE 148* 08/26/2012 0925   GLUCOSE 129* 08/18/2012 1429   BUN 10 08/26/2012 0925   BUN 13.0 08/18/2012 1429   CREATININE 0.69 08/26/2012 0925   CREATININE 0.9 08/18/2012 1429   CALCIUM 8.5 08/26/2012 0925   CALCIUM 9.5 08/18/2012 1429   GFRNONAA >90 08/26/2012 0925   GFRAA >90 08/26/2012 0925   CBC    Component Value Date/Time   WBC 11.3* 08/25/2012 0520   WBC 4.2 08/18/2012 1429   RBC 4.60 08/25/2012 0520   RBC 4.67 08/18/2012 1429   HGB 12.2* 08/25/2012 0520   HGB 13.0 08/18/2012  1429   HCT 35.7* 08/25/2012 0520   HCT 37.8* 08/18/2012 1429   PLT 238 08/25/2012 0520   PLT 310 08/18/2012 1429   MCV 77.6* 08/25/2012 0520   MCV 80.9 08/18/2012 1429   MCH 26.5 08/25/2012 0520   MCH 27.9 08/18/2012 1429   MCHC 34.2 08/25/2012 0520   MCHC 34.5 08/18/2012 1429   RDW 16.5* 08/25/2012 0520   RDW 16.4* 08/18/2012 1429   LYMPHSABS 2.5 08/25/2012 0520   LYMPHSABS 1.5 08/18/2012 1429   MONOABS 2.1* 08/25/2012 0520   MONOABS 0.1 08/18/2012 1429   EOSABS 0.1 08/25/2012 0520   EOSABS 0.1 08/18/2012 1429   BASOSABS 0.1 08/25/2012 0520   BASOSABS 0.0 08/18/2012 1429   CARDIAC ENZYMES Lab Results  Component Value Date   TROPONINI <0.30 06/15/2012    Scheduled Meds:   . sodium chloride   Intravenous Once  . sodium chloride   Intravenous Once  . calcium carbonate  1 tablet Oral TID  . heparin  5,000 Units Subcutaneous Q8H  . lipase/protease/amylase  4 capsule Oral TID AC  . metroNIDAZOLE  500 mg Oral Q8H  . multivitamin with minerals  1 tablet Oral Daily  . pantoprazole  40 mg Oral Daily  . piperacillin-tazobactam (ZOSYN)  IV  3.375 g Intravenous Q8H  . potassium  chloride  30 mEq Oral TID   Continuous Infusions:   . 0.9 % NaCl with KCl 40 mEq / L 50 mL/hr at 08/27/12 1336  . TPN (CLINIMIX) +/- additives 80 mL/hr at 08/27/12 1701   And  . fat emulsion 250 mL (08/27/12 1701)  . TPN (CLINIMIX) +/- additives 65 mL/hr at 08/26/12 1717   PRN Meds:.HYDROmorphone (DILAUDID) injection, LORazepam, ondansetron, ondansetron, ondansetron, oxyCODONE-acetaminophen, zolpidem  Assessment/Plan:  Patient Active Hospital Problem List: Dehydration Fever Colitis Papillary carcinoma(T3N1MX)  Hypertension  CAD  S/P Whipple procedure  S/P Pancreatic stent placement  Insomnia  Anxiety  Hypokalemia  Hyponatremia  NS bolus. CT chest r/o PE.   LOS: 9 days    Orpah Cobb  MD  08/27/2012, 5:32 PM     Short of breath and fever.

## 2012-08-28 ENCOUNTER — Other Ambulatory Visit: Payer: Medicare Other | Admitting: Lab

## 2012-08-28 ENCOUNTER — Inpatient Hospital Stay (HOSPITAL_COMMUNITY): Payer: Medicare Other

## 2012-08-28 DIAGNOSIS — J189 Pneumonia, unspecified organism: Secondary | ICD-10-CM

## 2012-08-28 LAB — MAGNESIUM: Magnesium: 1.7 mg/dL (ref 1.5–2.5)

## 2012-08-28 LAB — COMPREHENSIVE METABOLIC PANEL
Alkaline Phosphatase: 99 U/L (ref 39–117)
BUN: 10 mg/dL (ref 6–23)
GFR calc Af Amer: >90 mL/min (ref >90)
Glucose, Bld: 92 mg/dL (ref 70–99)
Potassium: 3.7 mEq/L (ref 3.5–5.1)
Total Bilirubin: 0.3 mg/dL (ref 0.3–1.2)
Total Protein: 5.7 g/dL — ABNORMAL LOW (ref 6.0–8.3)

## 2012-08-28 LAB — CBC WITH DIFFERENTIAL/PLATELET
Basophils Absolute: 0.1 10*3/uL (ref 0.0–0.1)
Eosinophils Absolute: 0.1 10*3/uL (ref 0.0–0.7)
Lymphocytes Relative: 14 % (ref 12–46)
MCHC: 33 g/dL (ref 30.0–36.0)
Neutrophils Relative %: 63 % (ref 43–77)
RDW: 17.8 % — ABNORMAL HIGH (ref 11.5–15.5)

## 2012-08-28 LAB — LACTIC ACID, PLASMA: Lactic Acid, Venous: 1.7 mmol/L (ref 0.5–2.2)

## 2012-08-28 LAB — GLUCOSE, CAPILLARY
Glucose-Capillary: 91 mg/dL (ref 70–99)
Glucose-Capillary: 112 mg/dL — ABNORMAL HIGH (ref 70–99)

## 2012-08-28 LAB — CLOSTRIDIUM DIFFICILE BY PCR: Toxigenic C. Difficile by PCR: NEGATIVE

## 2012-08-28 MED ORDER — INSULIN ASPART 100 UNIT/ML ~~LOC~~ SOLN: 0.0000 [IU] | Freq: Every day | SUBCUTANEOUS | Status: DC

## 2012-08-28 MED ORDER — SODIUM CHLORIDE 0.9 % IV BOLUS (SEPSIS): 250.0000 mL | Freq: Once | INTRAVENOUS | Status: DC

## 2012-08-28 MED ORDER — ALBUTEROL SULFATE (5 MG/ML) 0.5% IN NEBU
2.5000 mg | INHALATION_SOLUTION | Freq: Four times a day (QID) | RESPIRATORY_TRACT | Status: DC
  Administered 2012-08-28 – 2012-08-29 (×5): 2.5 mg via RESPIRATORY_TRACT
  Filled 2012-08-28 (×4): qty 0.5

## 2012-08-28 MED ORDER — CLINIMIX E/DEXTROSE (5/20) 5 % IV SOLN
INTRAVENOUS | Status: DC
  Filled 2012-08-28: qty 2000

## 2012-08-28 MED ORDER — INSULIN ASPART 100 UNIT/ML ~~LOC~~ SOLN
0.0000 [IU] | Freq: Three times a day (TID) | SUBCUTANEOUS | Status: DC
  Administered 2012-08-28: 2 [IU] via SUBCUTANEOUS
  Administered 2012-08-28: 1 [IU] via SUBCUTANEOUS

## 2012-08-28 MED ORDER — SODIUM PHOSPHATE 3 MMOLE/ML IV SOLN
10.0000 mmol | Freq: Once | INTRAVENOUS | Status: AC
  Administered 2012-08-28: 10 mmol via INTRAVENOUS
  Filled 2012-08-28: qty 3.33

## 2012-08-28 MED ORDER — SODIUM CHLORIDE 0.9 % IV SOLN
INTRAVENOUS | Status: DC
  Administered 2012-08-28: 02:00:00 via INTRAVENOUS
  Administered 2012-08-29: 20 mL/h via INTRAVENOUS

## 2012-08-28 NOTE — Progress Notes (Signed)
eLink Physician-Brief Progress Note Patient Name: Colin Castro DOB: 05-Jun-1946 MRN: [id number]  Date of Service  08/28/2012   HPI/Events of Note  Patient transferred from the floor with hypotension and PNA>  Has received 1700 cc fluid with current BP of 78/51 (57).  Has voided approx 250 cc urine.  Is currently receiving higher rate of fluids.  Bedside nurse has been in communication with primary MD.   eICU Interventions  Plan: Continue fluid administration for BP support Will check lactate   Intervention Category Intermediate Interventions: Hypotension - evaluation and management  DETERDING,ELIZABETH 08/28/2012, 12:38 AM

## 2012-08-28 NOTE — Progress Notes (Signed)
Subjective:  Afebrile. Improved blood pressure. No chest pain.  Objective:  Vital Signs in the last 24 hours: Temp:  [97.6 F (36.4 C)-99.2 F (37.3 C)] 98.4 F (36.9 C) (01/09 1200) Pulse Rate:  [80-116] 80  (01/09 1042) Cardiac Rhythm:  [-]  Resp:  [20-35] 28  (01/09 1042) BP: (62-117)/(42-64) 117/51 mmHg (01/09 1042) SpO2:  [95 %-100 %] 97 % (01/09 1042) Weight:  [58.3 kg (128 lb 8.5 oz)] 58.3 kg (128 lb 8.5 oz) (01/08 2245)  Physical Exam: BP Readings from Last 1 Encounters:  08/28/12 117/51    Wt Readings from Last 1 Encounters:  08/27/12 58.3 kg (128 lb 8.5 oz)    Weight change: -2.754 kg (-6 lb 1.2 oz)  HEENT: [place]/AT, Eyes-Brown, PERL, EOMI, Conjunctiva-Pale pink, Sclera-Non-icteric Neck: No JVD, No bruit, Trachea midline. Lungs:  Right lower lobe crackles. Cardiac:  Regular rhythm, normal S1 and S2, no S3.  Abdomen:  Soft, non-tender. Extremities:  No edema present. No cyanosis. No clubbing. CNS: AxOx3, Cranial nerves grossly intact, moves all 4 extremities. Right handed. Skin: Warm and dry.   Intake/Output from previous day: 01/08 0701 - 01/09 0700 In: 3085 [I.V.:1000; IV Piggyback:1005; TPN:1080] Out: 505 [Urine:502; Stool:3]    Lab Results: BMET    Component Value Date/Time   NA 134* 08/28/2012 0430   NA 135* 08/18/2012 1429   K 3.7 08/28/2012 0430   K 4.2 08/18/2012 1429   CL 105 08/28/2012 0430   CL 102 08/18/2012 1429   CO2 21 08/28/2012 0430   CO2 21* 08/18/2012 1429   GLUCOSE 92 08/28/2012 0430   GLUCOSE 129* 08/18/2012 1429   BUN 10 08/28/2012 0430   BUN 13.0 08/18/2012 1429   CREATININE 0.67 08/28/2012 0430   CREATININE 0.9 08/18/2012 1429   CALCIUM 7.7* 08/28/2012 0430   CALCIUM 9.5 08/18/2012 1429   GFRNONAA >90 08/28/2012 0430   GFRAA >90 08/28/2012 0430   CBC    Component Value Date/Time   WBC 13.3* 08/28/2012 0430   WBC 4.2 08/18/2012 1429   RBC 3.86* 08/28/2012 0430   RBC 4.67 08/18/2012 1429   HGB 10.1* 08/28/2012 0430   HGB 13.0 08/18/2012 1429   HCT 30.6* 08/28/2012 0430   HCT 37.8* 08/18/2012 1429   PLT 280 08/28/2012 0430   PLT 310 08/18/2012 1429   MCV 79.3 08/28/2012 0430   MCV 80.9 08/18/2012 1429   MCH 26.2 08/28/2012 0430   MCH 27.9 08/18/2012 1429   MCHC 33.0 08/28/2012 0430   MCHC 34.5 08/18/2012 1429   RDW 17.8* 08/28/2012 0430   RDW 16.4* 08/18/2012 1429   LYMPHSABS 1.9 08/28/2012 0430   LYMPHSABS 1.5 08/18/2012 1429   MONOABS 2.8* 08/28/2012 0430   MONOABS 0.1 08/18/2012 1429   EOSABS 0.1 08/28/2012 0430   EOSABS 0.1 08/18/2012 1429   BASOSABS 0.1 08/28/2012 0430   BASOSABS 0.0 08/18/2012 1429   CARDIAC ENZYMES Lab Results  Component Value Date   TROPONINI <0.30 06/15/2012    Scheduled Meds:   . albuterol  2.5 mg Nebulization Q6H  . calcium carbonate  1 tablet Oral TID  . heparin  5,000 Units Subcutaneous Q8H  . insulin aspart  0-5 Units Subcutaneous QHS  . insulin aspart  0-9 Units Subcutaneous TID WC  . lipase/protease/amylase  4 capsule Oral TID AC  . metroNIDAZOLE  500 mg Oral Q8H  . multivitamin with minerals  1 tablet Oral Daily  . pantoprazole  40 mg Oral Daily  . piperacillin-tazobactam (ZOSYN)  IV  3.375 g Intravenous Q8H  . potassium chloride  30 mEq Oral TID  . sodium chloride  250 mL Intravenous Once  . sodium phosphate  Dextrose 5% IVPB  10 mmol Intravenous Once   Continuous Infusions:   . sodium chloride 20 mL/hr at 08/28/12 0147  . dextrose 5 % and 0.9 % NaCl with KCl 20 mEq/L    . TPN (CLINIMIX) +/- additives     And  . fat emulsion     PRN Meds:.acetaminophen, HYDROmorphone (DILAUDID) injection, LORazepam, ondansetron, ondansetron, ondansetron, oxyCODONE-acetaminophen, zolpidem  Assessment/Plan:  Patient Active Hospital Problem List: Right lower lung pneumonia Acute sepsis from lung infection Acute Colitis Papillary carcinoma(T3N1MX)  CAD  S/P Whipple procedure  S/P Pancreatic stent placement  Insomnia  Anxiety  Hypokalemia-resolved  Hyponatremia-improving  CXR-RLL  oneumonia. Continue antibiotic.   LOS: 10 days    Orpah Cobb  MD  08/28/2012, 12:56 PM

## 2012-08-28 NOTE — Progress Notes (Signed)
At 2130 I checked pts blood pressure manually and got 62/50 and pt was very lethargic.  Dr. Algie Coffer notified and he gave orders to give a NS 250 cc bolus and transfer to stepdown.  Started bolus and rechecked BP with improvement to 84/50, pt still lethargic.  I called Pat, RN on stepdown and gave her report.  Dr. Algie Coffer came to bedside to see pt and then I transferred pt to stepdown.   Fatima Sanger A

## 2012-08-28 NOTE — Progress Notes (Signed)
  Echocardiogram 2D Echocardiogram has been performed.  Cristofer Yaffe 08/28/2012, 9:12 AM

## 2012-08-28 NOTE — Progress Notes (Signed)
PARENTERAL NUTRITION CONSULT NOTE - Follow Up  Pharmacy Consult for TNA Indication:  Bowel Rest for Small Bowel Enteritis   No Known Allergies  Patient Measurements: Height: 5\' 6"  (167.6 cm) Weight: 128 lb 8.5 oz (58.3 kg) IBW/kg (Calculated) : 63.8  Usual Weight: 155-156 lb  Vital Signs: Temp: 98.6 F (37 C) (01/09 0800) Temp src: Oral (01/09 0800) BP: 71/44 mmHg (01/09 0700) Pulse Rate: 92  (01/09 0700) Intake/Output from previous day: 01/08 0701 - 01/09 0700 In: 3085 [I.V.:1000; IV Piggyback:1005; TPN:1080] Out: 505 [Urine:502; Stool:3] Intake/Output from this shift:   Labs:  [facility] 08/28/12 0430 08/27/12 1852  WBC 13.3* 16.6*  HGB 10.1* 10.4*  HCT 30.6* 30.4*  PLT 280 274  APTT -- --  INR -- --    Basename 08/28/12 0430 08/27/12 1852 08/26/12 0925  NA 134* 133* 127*  K 3.7 3.1* 3.3*  CL 105 102 95*  CO2 21 22 17*  GLUCOSE 92 106* 148*  BUN 10 13 10   CREATININE 0.67 0.78 0.69  LABCREA -- -- --  CREAT24HRUR -- -- --  CALCIUM 7.7* 7.2* 8.5  MG 1.7 -- --  PHOS 2.2* -- --  PROT 5.7* -- --  ALBUMIN 2.3* -- --  AST 17 -- --  ALT 29 -- --  ALKPHOS 99 -- --  BILITOT 0.3 -- --  BILIDIR -- -- --  IBILI -- -- --  PREALBUMIN -- -- --  TRIG -- -- --  CHOLHDL -- -- --  CHOL -- -- --   Estimated Creatinine Clearance: 75.9 ml/min (by C-G formula based on Cr of 0.67).    Basename 08/28/12 0343  GLUCAP 91    Medications:  Scheduled:     . [COMPLETED] sodium chloride   Intravenous Once  . [COMPLETED] sodium chloride   Intravenous Once  . [COMPLETED] sodium chloride   Intravenous Once  . [COMPLETED] sodium chloride   Intravenous Once  . [COMPLETED] albumin human  12.5 g Intravenous Once  . calcium carbonate  1 tablet Oral TID  . [COMPLETED] furosemide  40 mg Intravenous Once  . heparin  5,000 Units Subcutaneous Q8H  . lipase/protease/amylase  4 capsule Oral TID AC  . metroNIDAZOLE  500 mg Oral Q8H  . multivitamin with minerals  1 tablet Oral Daily    . pantoprazole  40 mg Oral Daily  . piperacillin-tazobactam (ZOSYN)  IV  3.375 g Intravenous Q8H  . potassium chloride  30 mEq Oral TID  . [COMPLETED] potassium chloride  10 mEq Intravenous Q1 Hr x 4  . [COMPLETED] sodium bicarbonate  50 mEq Intravenous Once  . [COMPLETED] sodium chloride  250 mL Intravenous Once  . sodium chloride  250 mL Intravenous Once  . [DISCONTINUED] sodium chloride   Intravenous Once  . [DISCONTINUED] furosemide  40 mg Oral Daily  . [DISCONTINUED] potassium chloride  20 mEq Oral BID   Infusions:     . sodium chloride 20 mL/hr at 08/28/12 0147  . dextrose 5 % and 0.9 % NaCl with KCl 20 mEq/L    . TPN (CLINIMIX) +/- additives     And  . fat emulsion    . [EXPIRED] TPN (CLINIMIX) +/- additives 65 mL/hr at 08/26/12 1717  . [DISCONTINUED] 0.9 % NaCl with KCl 40 mEq / L 50 mL/hr at 08/27/12 1336  . [DISCONTINUED] fat emulsion 250 mL (08/27/12 1701)  . [DISCONTINUED] TPN (CLINIMIX) +/- additives 80 mL/hr at 08/27/12 1701    Insulin Requirements in the past 24 hours:  None ordered  Nutritional Goals:  Per 12/31 RD note: Kcal: 1750-1950,  Protein: 70-90g,  Fluid: > 1.9L/day  Goal TNA: Clinimix E 5/20 @ 65ml/hr + 20% IV lipids on MWF will provide 96 g protein/day and 1896 kcal/day avg (1689 kcal on non-lipid days, 2169 kcal on lipid days)  Current Nutrition:  Regular diet; fat modified not tolerated and patient changed back to clear liquid diet 08/26/12 AM, then advanced to FLD 1/8 pm IVF: D5NS with 20 mEq of K+ @ 100 ml/hr (changed by MD on 1/8) TNA: Clinimix E5/20 @ 80 ml/hr, Lipids MWF only  Assessment: 67 yo M with pancreatic cancer admitted 08/18/12 with CC: abdominal pain. Pt is s/p Whipple on 06/06/12 and s/p chemo on 08/14/12. Prior to admission, patient reported 10-15 days of abdominal pain, diarrhea, nausea, and vomiting.  Pt was started on TNA via PAC for nutritional support 1/3. Per RD note, patient is classified as severely malnourished with ~16%  weight loss (25 lbs) in the past 2 months and poor PO intake ~ 3 days prior to admission.   Labs  Lytes: Na remains low (cannot adjust Na+ conc of TNA). Corrected Ca wnl, K now back to wnl  Renal/LFTs: Phos slightly low  Lipid Profile: TG 121 (1/6)  Prealbumin 9 (1/4), 11.7 (1/6)  Last night, pt was unable to tolerate IV KCl runs via peripheral line, so TNA was stopped for 4 hours and the TNA line was used to infuse the K-runs. TNA needs to have a dedicated line due to infection risk. Discussed this with Dr. Donell Beers (via an OR RN) - suggested to d/c the TNA; however, MD would like to continue it for now since patient reportedly is not eating his diet. Recommend adding an additional central line if possible, to avoid using the TNA line. OR RN to discuss with Dr. Donell Beers.  Plan  Continue TNA at goal rate of 80 ml/hr.  10 mMol NaPhos IV x1 today  Defer managament of mIVF to MD (fluids changed and increased on 1/8 per MD)  Lipids to be given on MWF only due to national shortage  Multivitamin and trace elements to be provided orally since patient currently tolerating other oral meds  BMET and Phos level in am  Will order CBGs and sensitive SSI to make sure pt is tolerating TNA - will order TID+HS since pt technically has a diet order  Suggest adding another central line so TNA line can be a dedicated line (if med unable to be given peripherally)  Darrol Angel, PharmD Pager: [phone] 08/28/2012 8:50 AM

## 2012-08-28 NOTE — Consult Note (Signed)
[facility]     Reason for Consult: HCAP    Referring Physician: Dr. Algie Coffer  Active Problems:  * No active hospital problems. *       . albuterol  2.5 mg Nebulization Q6H  . calcium carbonate  1 tablet Oral TID  . heparin  5,000 Units Subcutaneous Q8H  . insulin aspart  0-5 Units Subcutaneous QHS  . insulin aspart  0-9 Units Subcutaneous TID WC  . lipase/protease/amylase  4 capsule Oral TID AC  . metroNIDAZOLE  500 mg Oral Q8H  . multivitamin with minerals  1 tablet Oral Daily  . pantoprazole  40 mg Oral Daily  . piperacillin-tazobactam (ZOSYN)  IV  3.375 g Intravenous Q8H  . potassium chloride  30 mEq Oral TID  . sodium chloride  250 mL Intravenous Once  . sodium phosphate  Dextrose 5% IVPB  10 mmol Intravenous Once    Recommendations: Continue Zosyn for HCAP  I will d/c flagyl since Zosyn will cover abdominal organisms and no C diff  Assessment:  hypotension associated with sob and RLL infiltrate c/w HCAP.  His WBC has improved and he reports feeling much better compared to yesterday in regards to his breathing.   Antibiotics: Zosyn cipro 12/30 - 1/7 Flagyl 12/30 - 1/9   HPI: Colin Castro is a 67 y.o. male with a history of pancreatic cancer s/p Whipple in October 2013 and chemo who presented on 12/30 with abdominal pain, n/v.  CT significant for enteritis and started on cipro/flagyl.  He had difficulty advancing diet and started on TPN on 1/4.  He was being weaned off of TPN and tolerating diet but developed some delirium and sob with hypotension and work up revealed new RLL infiltrate concerning for pneumonia.  The patient was then transferred to ICU and started on Zosyn.  Today, he tells me he feels much better and no longer SOB.  His WBC is improved.  No fever or chills.     Review of Systems: Pertinent items are noted in HPI.  Past Medical History  Diagnosis Date  . Hypertension   . High cholesterol   . Coronary artery disease     . Common bile duct (CBD) stricture     Malignant  . 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 Pneumonia   . Constipation   . Myocardial infarction     15 years ago    History  Substance Use Topics  . Smoking status: Current Every Day Smoker -- 0.2 packs/day for 48 years    Types: Cigarettes  . Smokeless tobacco: Never Used  . Alcohol Use: No    Family History  Problem Relation Age of Onset  . Ovarian cancer Mother     lived 25 years past diagnosis   No Known Allergies  OBJECTIVE: Blood pressure 129/56, pulse 86, temperature 98.4 F (36.9 C), temperature source Oral, resp. rate 29, height 5\' 6"  (1.676 m), weight 128 lb 8.5 oz (58.3 kg), SpO2 97.00%. General: Awake, alert, nad Lungs: CTA B Cor: RRR without m/r/g Abdomen: soft, ntnd, +bs   Microbiology: Recent Results (from the past 240 hour(s))  MRSA PCR SCREENING     Status: Normal   Collection Time   08/19/12  4:12 AM      Component Value Range Status Comment   MRSA by PCR NEGATIVE  NEGATIVE Final   CLOSTRIDIUM DIFFICILE BY PCR  Status: Normal   Collection Time   08/26/12 10:41 PM      Component Value Range Status Comment   C difficile by pcr NEGATIVE  NEGATIVE Final   CLOSTRIDIUM DIFFICILE BY PCR     Status: Normal   Collection Time   08/28/12  6:15 AM      Component Value Range Status Comment   C difficile by pcr NEGATIVE  NEGATIVE Final     Staci Righter, MD [facility] [phone] pager  [phone] cell 08/28/2012, 1:46 PM

## 2012-08-28 NOTE — Progress Notes (Signed)
Patient ID: Colin Castro, male   DOB: 12/25/1945, 67 y.o.   MRN: [id number]    Subjective: Pt transferred to ICU with hypotension and shortness of breath.  Chest Ct negative for PE, but positive for RLL infiltrate.  Still with some abdominal pain and diarrhea, but less so than previously.    Objective: Vital signs in last 24 hours: Temp:  [97.6 F (36.4 C)-99.2 F (37.3 C)] 98.5 F (36.9 C) (01/09 0400) Pulse Rate:  [80-116] 88  (01/09 0500) Resp:  [20-29] 28  (01/09 0500) BP: (62-113)/(43-64) 87/54 mmHg (01/09 0500) SpO2:  [95 %-100 %] 98 % (01/09 0500) Weight:  [128 lb 8.5 oz (58.3 kg)] 128 lb 8.5 oz (58.3 kg) (01/08 2245) Last BM Date: 08/27/12  Intake/Output from previous day: 01/08 0701 - 01/09 0700 In: 2705 [I.V.:800; IV Piggyback:1005; TPN:900] Out: 254 [Urine:252; Stool:2] Intake/Output this shift:    General appearance: alert and no distress GI: soft, slightly tender,  non distended. Extremities: extremities normal, atraumatic, no cyanosis or edema  Lab Results:   Basename 08/28/12 0430 08/27/12 1852  WBC 13.3* 16.6*  HGB 10.1* 10.4*  HCT 30.6* 30.4*  PLT 280 274   BMET  Basename 08/28/12 0430 08/27/12 1852  NA 134* 133*  K 3.7 3.1*  CL 105 102  CO2 21 22  GLUCOSE 92 106*  BUN 10 13  CREATININE 0.67 0.78  CALCIUM 7.7* 7.2*   PT/INR No results found for this basename: LABPROT:2,INR:2 in the last 72 hours ABG No results found for this basename: PHART:2,PCO2:2,PO2:2,HCO3:2 in the last 72 hours  Studies/Results: Ct Angio Chest Pe W/cm &/or Wo Cm  08/27/2012  *RADIOLOGY REPORT*  Clinical Data: Short of breath.  Pulmonary embolism.  Pancreatic cancer.  CT ANGIOGRAPHY CHEST  Technique:  Multidetector CT imaging of the chest using the standard protocol during bolus administration of intravenous contrast. Multiplanar reconstructed images including MIPs were obtained and reviewed to evaluate the vascular anatomy.  Contrast: OMNIPAQUE IOHEXOL 350 MG/ML SOLN   Comparison: 07/25/2012.  Findings: There is respiratory motion artifact and bolus dispersion present on the examination precluding evaluation of segmental and smaller lower lobe pulmonary vessels.  The upper lobe pulmonary arteries are well evaluated.  There is no pulmonary embolus identified.  Left subclavian Port-A-Cath is present.  Aortic atherosclerosis.  No axillary adenopathy.  No hilar adenopathy. Borderline subcarinal adenopathy is present which may be reactive or neoplastic.  Subcarinal lymph node measures 15 mm short axis.  No pericardial effusion.  Emphysema is present in the lungs with dependent atelectasis.  There is new right lower lobe pneumonia. Pneumobilia and cholecystectomy noted in the abdomen.  No aggressive osseous lesions.  Thoracic spine DISH.  IMPRESSION: 1.  No pulmonary embolus identified.  Suboptimal evaluation of the lower lobe segmental and smaller pulmonary vessels. 2.  Right lower lobe pneumonia. 3.  Emphysema. 4.  Atherosclerosis and coronary artery disease. 5.  Study mildly degraded by respiratory motion artifact and bolus dispersion.   Original Report Authenticated By: Andreas Newport, M.D.    Ct Abdomen Pelvis W Contrast  08/26/2012  *RADIOLOGY REPORT*  Clinical Data: Abdominal pain with vomiting and diarrhea on antibiotics.  Evaluate enteritis.  History of pancreatic cancer.  CT ABDOMEN AND PELVIS WITH CONTRAST  Technique:  Multidetector CT imaging of the abdomen and pelvis was performed following the standard protocol during bolus administration of intravenous contrast.  Contrast: OMNIPAQUE IOHEXOL 300 MG/ML  SOLN  Comparison: Radiographs 08/21/2012.  CT 08/18/2012 and 07/25/2012.  Findings:  There is residual asymmetric right lower lobe air space disease associated with central airway thickening, suspicious for possible aspiration.  The left basilar aeration has improved. There is no significant pleural effusion.  No residual hepatic abnormality is identified.  There is  suspected interval recanalization of portal venous thrombus inferiorly in the right hepatic lobe.  A small amount of pneumobilia remains status post cholecystectomy and partial pancreatic resection.  There is no biliary dilatation. The pancreas, spleen and adrenal glands otherwise appear unremarkable.  There are stable low-density renal lesions bilaterally.  The gastrojejunostomy appears stable.  There is no small bowel dilatation or significant residual wall thickening.  However, there is mild diffuse colonic wall thickening, especially within the right and proximal transverse colon.  There is no evidence of bowel obstruction or focal extraluminal fluid collection. Postsurgical changes within the omental fat deep to the umbilicus are stable.  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 The urinary bladder, prostate gland and seminal vesicles appear normal.  There are no worrisome osseous findings.  IMPRESSION:  1.  New mild diffuse colonic wall thickening, especially proximally, suspicious for colitis (likely pseudomembranous in this clinical context).  Distal small bowel wall thickening noted previously has resolved. 2.  No evidence of bowel obstruction, extraluminal fluid collection or metastatic disease. 3.  Persistent asymmetric right lower lobe air space disease suspicious for possible aspiration. 4.  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   Original Report Authenticated By: Carey Bullocks, M.D.    Dg Chest Port 1 View  08/27/2012  *RADIOLOGY REPORT*  Clinical Data: Shortness of breath and weakness.  PORTABLE CHEST - 1 VIEW  Comparison: Chest x-ray 07/24/2012.  Findings: Left-sided subclavian single lumen power Port-A-Cath with tip terminating at the superior cavoatrial junction.  Lung volumes are normal.  No acute consolidative air space disease.  No pleural effusions.   Pulmonary vasculature is within normal limits.  The heart size is normal.  Mediastinal contours appear within normal limits.  Atherosclerosis in the thoracic aorta.  IMPRESSION: 1.  No radiographic evidence of acute cardiopulmonary disease. 2.  Atherosclerosis.   Original Report Authenticated By: Trudie Reed, M.D.     Anti-infectives: Anti-infectives     Start     Dose/Rate Route Frequency Ordered Stop   08/27/12 1800   piperacillin-tazobactam (ZOSYN) IVPB 3.375 g        3.375 g 12.5 mL/hr over 240 Minutes Intravenous Every 8 hours 08/27/12 1722     08/21/12 2000   ciprofloxacin (CIPRO) tablet 500 mg  Status:  Discontinued        500 mg Oral 2 times daily 08/21/12 1424 08/26/12 1544   08/19/12 0100   ciprofloxacin (CIPRO) IVPB 400 mg  Status:  Discontinued        400 mg 200 mL/hr over 60 Minutes Intravenous 2 times daily 08/19/12 0051 08/21/12 1423   08/19/12 0100   metroNIDAZOLE (FLAGYL) tablet 500 mg        500 mg Oral 3 times per day 08/19/12 0051     08/18/12 2330   Ampicillin-Sulbactam (UNASYN) 3 g in sodium chloride 0.9 % 100 mL IVPB        3 g 100 mL/hr over 60 Minutes Intravenous  Once 08/18/12 2315 08/19/12 0100          Assessment/Plan: s/p * No surgery found *  Clear liquids as tolerated.   CT with no  enteritis, now with colitis.   On PO flagyl.    Diarrhea likely secondary to colitis.  C diff negative x 1.  Will repeat.    On oral flagyl and now zosyn.  Blood cx pending.     LOS: 10 days    Dusty Raczkowski 08/28/2012

## 2012-08-29 ENCOUNTER — Encounter (INDEPENDENT_AMBULATORY_CARE_PROVIDER_SITE_OTHER): Payer: Self-pay | Admitting: General Surgery

## 2012-08-29 LAB — GLUCOSE, CAPILLARY: Glucose-Capillary: 88 mg/dL (ref 70–99)

## 2012-08-29 LAB — BASIC METABOLIC PANEL
BUN: 6 mg/dL (ref 6–23)
CO2: 16 mEq/L — ABNORMAL LOW (ref 19–32)
Calcium: 8.3 mg/dL — ABNORMAL LOW (ref 8.4–10.5)
Creatinine, Ser: 0.68 mg/dL (ref 0.50–1.35)
Glucose, Bld: 94 mg/dL (ref 70–99)

## 2012-08-29 MED ORDER — CLINIMIX E/DEXTROSE (5/20) 5 % IV SOLN
INTRAVENOUS | Status: DC
  Filled 2012-08-29: qty 2000

## 2012-08-29 MED ORDER — LEVOFLOXACIN 750 MG PO TABS
750.0000 mg | ORAL_TABLET | Freq: Every day | ORAL | Status: DC
  Administered 2012-08-30: 750 mg via ORAL
  Filled 2012-08-29: qty 1

## 2012-08-29 MED ORDER — POTASSIUM CHLORIDE CRYS ER 20 MEQ PO TBCR
20.0000 meq | EXTENDED_RELEASE_TABLET | Freq: Three times a day (TID) | ORAL | Status: DC
  Administered 2012-08-29 – 2012-08-30 (×4): 20 meq via ORAL
  Filled 2012-08-29 (×5): qty 1

## 2012-08-29 MED ORDER — GUAIFENESIN-DM 100-10 MG/5ML PO SYRP
5.0000 mL | ORAL_SOLUTION | Freq: Four times a day (QID) | ORAL | Status: DC | PRN
  Administered 2012-08-29 – 2012-08-30 (×2): 10 mL via ORAL
  Filled 2012-08-29 (×2): qty 10

## 2012-08-29 MED ORDER — ALBUTEROL SULFATE (5 MG/ML) 0.5% IN NEBU: 2.5000 mg | INHALATION_SOLUTION | Freq: Four times a day (QID) | RESPIRATORY_TRACT | Status: DC | PRN

## 2012-08-29 NOTE — Progress Notes (Signed)
Subjective:  Feeling better.   Objective:  Vital Signs in the last 24 hours: Temp:  [98 F (36.7 C)-98.7 F (37.1 C)] 98 F (36.7 C) (01/10 0800) Pulse Rate:  [73-101] 101  (01/10 0400) Cardiac Rhythm:  [-] Normal sinus rhythm (01/10 0400) Resp:  [18-32] 32  (01/10 0400) BP: (102-138)/(50-69) 129/56 mmHg (01/10 0430) SpO2:  [96 %-98 %] 97 % (01/10 0813)  Physical Exam: BP Readings from Last 1 Encounters:  08/29/12 129/56    Wt Readings from Last 1 Encounters:  08/27/12 58.3 kg (128 lb 8.5 oz)    Weight change:   HEENT: [place]/AT, Eyes-Brown, PERL, EOMI, Conjunctiva-Pale pink, Sclera-Non-icteric Neck: No JVD, No bruit, Trachea midline. Lungs:  Clear, Bilateral. Cardiac:  Regular rhythm, normal S1 and S2, no S3.  Abdomen:  Soft, non-tender. Extremities:  No edema present. No cyanosis. No clubbing. CNS: AxOx3, Cranial nerves grossly intact, moves all 4 extremities. Right handed. Skin: Warm and dry.   Intake/Output from previous day: 01/09 0701 - 01/10 0700 In: 3184 [P.O.:120; I.V.:1720; IV Piggyback:444; TPN:900] Out: 3 [Urine:1; Stool:2]    Lab Results: BMET    Component Value Date/Time   NA 134* 08/29/2012 0400   NA 135* 08/18/2012 1429   K 4.2 08/29/2012 0400   K 4.2 08/18/2012 1429   CL 107 08/29/2012 0400   CL 102 08/18/2012 1429   CO2 16* 08/29/2012 0400   CO2 21* 08/18/2012 1429   GLUCOSE 94 08/29/2012 0400   GLUCOSE 129* 08/18/2012 1429   BUN 6 08/29/2012 0400   BUN 13.0 08/18/2012 1429   CREATININE 0.68 08/29/2012 0400   CREATININE 0.9 08/18/2012 1429   CALCIUM 8.3* 08/29/2012 0400   CALCIUM 9.5 08/18/2012 1429   GFRNONAA >90 08/29/2012 0400   GFRAA >90 08/29/2012 0400   CBC    Component Value Date/Time   WBC 13.3* 08/28/2012 0430   WBC 4.2 08/18/2012 1429   RBC 3.86* 08/28/2012 0430   RBC 4.67 08/18/2012 1429   HGB 10.1* 08/28/2012 0430   HGB 13.0 08/18/2012 1429   HCT 30.6* 08/28/2012 0430   HCT 37.8* 08/18/2012 1429   PLT 280 08/28/2012 0430   PLT 310  08/18/2012 1429   MCV 79.3 08/28/2012 0430   MCV 80.9 08/18/2012 1429   MCH 26.2 08/28/2012 0430   MCH 27.9 08/18/2012 1429   MCHC 33.0 08/28/2012 0430   MCHC 34.5 08/18/2012 1429   RDW 17.8* 08/28/2012 0430   RDW 16.4* 08/18/2012 1429   LYMPHSABS 1.9 08/28/2012 0430   LYMPHSABS 1.5 08/18/2012 1429   MONOABS 2.8* 08/28/2012 0430   MONOABS 0.1 08/18/2012 1429   EOSABS 0.1 08/28/2012 0430   EOSABS 0.1 08/18/2012 1429   BASOSABS 0.1 08/28/2012 0430   BASOSABS 0.0 08/18/2012 1429   CARDIAC ENZYMES Lab Results  Component Value Date   TROPONINI <0.30 06/15/2012    Scheduled Meds:   . albuterol  2.5 mg Nebulization Q6H  . calcium carbonate  1 tablet Oral TID  . heparin  5,000 Units Subcutaneous Q8H  . insulin aspart  0-5 Units Subcutaneous QHS  . insulin aspart  0-9 Units Subcutaneous TID WC  . lipase/protease/amylase  4 capsule Oral TID AC  . multivitamin with minerals  1 tablet Oral Daily  . pantoprazole  40 mg Oral Daily  . piperacillin-tazobactam (ZOSYN)  IV  3.375 g Intravenous Q8H  . potassium chloride  30 mEq Oral TID  . sodium chloride  250 mL Intravenous Once   Continuous Infusions:   .  sodium chloride 20 mL/hr at 08/28/12 0147  . dextrose 5 % and 0.9 % NaCl with KCl 20 mEq/L 50 mL/hr at 08/29/12 0805   PRN Meds:.acetaminophen, HYDROmorphone (DILAUDID) injection, LORazepam, ondansetron, ondansetron, ondansetron, oxyCODONE-acetaminophen, zolpidem  Assessment/Plan:  Patient Active Hospital Problem List: Right lower lung pneumonia  Acute sepsis from lung infection -Improved Acute Colitis  Papillary carcinoma(T3N1MX)  CAD  S/P Whipple procedure  S/P Pancreatic stent placement  Insomnia  Anxiety  Hypokalemia-resolved  Hyponatremia-improving  Continue medical treatment. Transfer to regular floor.   LOS: 11 days    Orpah Cobb  MD  08/29/2012, 8:43 AM

## 2012-08-29 NOTE — Progress Notes (Addendum)
Patient ID: Colin Castro, male   DOB: 1945/11/13, 67 y.o.   MRN: [id number]    Subjective: Pt doing much better.  Hungry.  No abd pain or shortness of breath.    Objective: Vital signs in last 24 hours: Temp:  [98 F (36.7 C)-98.7 F (37.1 C)] 98 F (36.7 C) (01/10 0800) Pulse Rate:  [73-101] 101  (01/10 0400) Resp:  [18-32] 32  (01/10 0400) BP: (102-138)/(50-69) 129/56 mmHg (01/10 0430) SpO2:  [96 %-98 %] 97 % (01/10 0813) Last BM Date: 08/28/12  Intake/Output from previous day: 01/09 0701 - 01/10 0700 In: 3184 [P.O.:120; I.V.:1720; IV Piggyback:444; TPN:900] Out: 3 [Urine:1; Stool:2] Intake/Output this shift:    General appearance: alert and no distress GI: soft, non tender,  non distended. Extremities: extremities normal, atraumatic, no cyanosis or edema  Lab Results:   Basename 08/28/12 0430 08/27/12 1852  WBC 13.3* 16.6*  HGB 10.1* 10.4*  HCT 30.6* 30.4*  PLT 280 274   BMET  Basename 08/29/12 0400 08/28/12 0430  NA 134* 134*  K 4.2 3.7  CL 107 105  CO2 16* 21  GLUCOSE 94 92  BUN 6 10  CREATININE 0.68 0.67  CALCIUM 8.3* 7.7*   PT/INR No results found for this basename: LABPROT:2,INR:2 in the last 72 hours ABG No results found for this basename: PHART:2,PCO2:2,PO2:2,HCO3:2 in the last 72 hours  Studies/Results: Ct Angio Chest Pe W/cm &/or Wo Cm  08/27/2012  *RADIOLOGY REPORT*  Clinical Data: Short of breath.  Pulmonary embolism.  Pancreatic cancer.  CT ANGIOGRAPHY CHEST  Technique:  Multidetector CT imaging of the chest using the standard protocol during bolus administration of intravenous contrast. Multiplanar reconstructed images including MIPs were obtained and reviewed to evaluate the vascular anatomy.  Contrast: OMNIPAQUE IOHEXOL 350 MG/ML SOLN  Comparison: 07/25/2012.  Findings: There is respiratory motion artifact and bolus dispersion present on the examination precluding evaluation of segmental and smaller lower lobe pulmonary vessels.  The upper  lobe pulmonary arteries are well evaluated.  There is no pulmonary embolus identified.  Left subclavian Port-A-Cath is present.  Aortic atherosclerosis.  No axillary adenopathy.  No hilar adenopathy. Borderline subcarinal adenopathy is present which may be reactive or neoplastic.  Subcarinal lymph node measures 15 mm short axis.  No pericardial effusion.  Emphysema is present in the lungs with dependent atelectasis.  There is new right lower lobe pneumonia. Pneumobilia and cholecystectomy noted in the abdomen.  No aggressive osseous lesions.  Thoracic spine DISH.  IMPRESSION: 1.  No pulmonary embolus identified.  Suboptimal evaluation of the lower lobe segmental and smaller pulmonary vessels. 2.  Right lower lobe pneumonia. 3.  Emphysema. 4.  Atherosclerosis and coronary artery disease. 5.  Study mildly degraded by respiratory motion artifact and bolus dispersion.   Original Report Authenticated By: Andreas Newport, M.D.    Dg Chest Port 1 View  08/28/2012  *RADIOLOGY REPORT*  Clinical Data: Follow-up respiratory distress.  PORTABLE CHEST - 1 VIEW  Comparison: Yesterday  Findings: Left subclavian vein Port-A-Cath stable with its tip at the cavoatrial junction.  Mild airspace disease at the right base persists.  Volume loss at the left base.  No pneumothorax.  IMPRESSION: Stable right lower lobe airspace disease.   Original Report Authenticated By: Jolaine Click, M.D.    Dg Chest Port 1 View  08/27/2012  *RADIOLOGY REPORT*  Clinical Data: Shortness of breath and weakness.  PORTABLE CHEST - 1 VIEW  Comparison: Chest x-ray 07/24/2012.  Findings: Left-sided subclavian single lumen power  Port-A-Cath with tip terminating at the superior cavoatrial junction.  Lung volumes are normal.  No acute consolidative air space disease.  No pleural effusions.  Pulmonary vasculature is within normal limits.  The heart size is normal.  Mediastinal contours appear within normal limits.  Atherosclerosis in the thoracic aorta.   IMPRESSION: 1.  No radiographic evidence of acute cardiopulmonary disease. 2.  Atherosclerosis.   Original Report Authenticated By: Trudie Reed, M.D.     Anti-infectives: Anti-infectives     Start     Dose/Rate Route Frequency Ordered Stop   08/27/12 1800   piperacillin-tazobactam (ZOSYN) IVPB 3.375 g        3.375 g 12.5 mL/hr over 240 Minutes Intravenous Every 8 hours 08/27/12 1722     08/21/12 2000   ciprofloxacin (CIPRO) tablet 500 mg  Status:  Discontinued        500 mg Oral 2 times daily 08/21/12 1424 08/26/12 1544   08/19/12 0100   ciprofloxacin (CIPRO) IVPB 400 mg  Status:  Discontinued        400 mg 200 mL/hr over 60 Minutes Intravenous 2 times daily 08/19/12 0051 08/21/12 1423   08/19/12 0100   metroNIDAZOLE (FLAGYL) tablet 500 mg  Status:  Discontinued        500 mg Oral 3 times per day 08/19/12 0051 08/28/12 1357   08/18/12 2330   Ampicillin-Sulbactam (UNASYN) 3 g in sodium chloride 0.9 % 100 mL IVPB        3 g 100 mL/hr over 60 Minutes Intravenous  Once 08/18/12 2315 08/19/12 0100          Assessment/Plan: s/p * No surgery found *  Colitis improved.  No diarrhea. Diarrhea likely secondary to colitis.  C diff negative x 1.  Will repeat.    Home when diet advance.  If tolerating full liquids and some solids, can go home today.    LOS: 11 days    Barre Aydelott 08/29/2012

## 2012-08-29 NOTE — Progress Notes (Signed)
[facility]  Date of Admission:  08/18/2012  Antibiotics: Zosyn day 3  Subjective: Feels well, no sob, no cough  Objective: Temp:  [97.5 F (36.4 C)-98.7 F (37.1 C)] 98 F (36.7 C) (01/10 1330) Pulse Rate:  [73-101] 83  (01/10 1330) Resp:  [18-32] 30  (01/10 1330) BP: (102-138)/(50-72) 117/58 mmHg (01/10 1330) SpO2:  [96 %-98 %] 96 % (01/10 1441) Weight:  [130 lb 15.3 oz (59.4 kg)] 130 lb 15.3 oz (59.4 kg) (01/10 1330)  General: AAOx3, nad Skin: no rashes Lungs: CTA B Cor: RRR without m/r/g  Lab Results Lab Results  Component Value Date   WBC 13.3* 08/28/2012   HGB 10.1* 08/28/2012   HCT 30.6* 08/28/2012   MCV 79.3 08/28/2012   PLT 280 08/28/2012    Lab Results  Component Value Date   CREATININE 0.68 08/29/2012   BUN 6 08/29/2012   NA 134* 08/29/2012   K 4.2 08/29/2012   CL 107 08/29/2012   CO2 16* 08/29/2012    Lab Results  Component Value Date   ALT 29 08/28/2012   AST 17 08/28/2012   ALKPHOS 99 08/28/2012   BILITOT 0.3 08/28/2012      Microbiology: Recent Results (from the past 240 hour(s))  CLOSTRIDIUM DIFFICILE BY PCR     Status: Normal   Collection Time   08/26/12 10:41 PM      Component Value Range Status Comment   C difficile by pcr NEGATIVE  NEGATIVE Final   CULTURE, BLOOD (ROUTINE X 2)     Status: Normal (Preliminary result)   Collection Time   08/27/12  6:40 PM      Component Value Range Status Comment   Specimen Description BLOOD RIGHT HAND   Final    Special Requests BOTTLES DRAWN AEROBIC ONLY 3CC   Final    Culture  Setup Time 08/28/2012 00:47   Final    Culture     Final    Value:        BLOOD CULTURE RECEIVED NO GROWTH TO DATE CULTURE WILL BE HELD FOR 5 DAYS BEFORE ISSUING A FINAL NEGATIVE REPORT   Report Status PENDING   Incomplete   CULTURE, BLOOD (ROUTINE X 2)     Status: Normal (Preliminary result)   Collection Time   08/27/12  6:45 PM      Component Value Range Status Comment   Specimen Description BLOOD LEFT HAND   Final     Special Requests BOTTLES DRAWN AEROBIC AND ANAEROBIC 5CC   Final    Culture  Setup Time 08/28/2012 00:46   Final    Culture     Final    Value:        BLOOD CULTURE RECEIVED NO GROWTH TO DATE CULTURE WILL BE HELD FOR 5 DAYS BEFORE ISSUING A FINAL NEGATIVE REPORT   Report Status PENDING   Incomplete   CLOSTRIDIUM DIFFICILE BY PCR     Status: Normal   Collection Time   08/28/12  6:15 AM      Component Value Range Status Comment   C difficile by pcr NEGATIVE  NEGATIVE Final     Studies/Results: Ct Angio Chest Pe W/cm &/or Wo Cm  08/27/2012  *RADIOLOGY REPORT*  Clinical Data: Short of breath.  Pulmonary embolism.  Pancreatic cancer.  CT ANGIOGRAPHY CHEST  Technique:  Multidetector CT imaging of the chest using the standard protocol during bolus administration of intravenous contrast. Multiplanar reconstructed images including MIPs were obtained and reviewed  to evaluate the vascular anatomy.  Contrast: OMNIPAQUE IOHEXOL 350 MG/ML SOLN  Comparison: 07/25/2012.  Findings: There is respiratory motion artifact and bolus dispersion present on the examination precluding evaluation of segmental and smaller lower lobe pulmonary vessels.  The upper lobe pulmonary arteries are well evaluated.  There is no pulmonary embolus identified.  Left subclavian Port-A-Cath is present.  Aortic atherosclerosis.  No axillary adenopathy.  No hilar adenopathy. Borderline subcarinal adenopathy is present which may be reactive or neoplastic.  Subcarinal lymph node measures 15 mm short axis.  No pericardial effusion.  Emphysema is present in the lungs with dependent atelectasis.  There is new right lower lobe pneumonia. Pneumobilia and cholecystectomy noted in the abdomen.  No aggressive osseous lesions.  Thoracic spine DISH.  IMPRESSION: 1.  No pulmonary embolus identified.  Suboptimal evaluation of the lower lobe segmental and smaller pulmonary vessels. 2.  Right lower lobe pneumonia. 3.  Emphysema. 4.  Atherosclerosis and  coronary artery disease. 5.  Study mildly degraded by respiratory motion artifact and bolus dispersion.   Original Report Authenticated By: Andreas Newport, M.D.    Dg Chest Port 1 View  08/28/2012  *RADIOLOGY REPORT*  Clinical Data: Follow-up respiratory distress.  PORTABLE CHEST - 1 VIEW  Comparison: Yesterday  Findings: Left subclavian vein Port-A-Cath stable with its tip at the cavoatrial junction.  Mild airspace disease at the right base persists.  Volume loss at the left base.  No pneumothorax.  IMPRESSION: Stable right lower lobe airspace disease.   Original Report Authenticated By: Jolaine Click, M.D.    Dg Chest Port 1 View  08/27/2012  *RADIOLOGY REPORT*  Clinical Data: Shortness of breath and weakness.  PORTABLE CHEST - 1 VIEW  Comparison: Chest x-ray 07/24/2012.  Findings: Left-sided subclavian single lumen power Port-A-Cath with tip terminating at the superior cavoatrial junction.  Lung volumes are normal.  No acute consolidative air space disease.  No pleural effusions.  Pulmonary vasculature is within normal limits.  The heart size is normal.  Mediastinal contours appear within normal limits.  Atherosclerosis in the thoracic aorta.  IMPRESSION: 1.  No radiographic evidence of acute cardiopulmonary disease. 2.  Atherosclerosis.   Original Report Authenticated By: Trudie Reed, M.D.     Assessment/Plan: 1) HCAP - doing well.  I will change him to po levaquin and he can complete about 3 more days as long as he continues to do well.    I will sign off, Dr. Ninetta Lights available over the weekend though if needed.    Staci Righter, MD [facility] [phone] pager   08/29/2012, 3:15 PM

## 2012-08-29 NOTE — Progress Notes (Addendum)
PARENTERAL NUTRITION CONSULT NOTE - Follow Up  Pharmacy Consult for TNA Indication:  Bowel Rest for Small Bowel Enteritis   No Known Allergies  Patient Measurements: Height: 5\' 6"  (167.6 cm) Weight: 128 lb 8.5 oz (58.3 kg) IBW/kg (Calculated) : 63.8  Usual Weight: 155-156 lb  Vital Signs: Temp: 98.3 F (36.8 C) (01/10 0400) Temp src: Oral (01/10 0400) BP: 129/56 mmHg (01/10 0430) Pulse Rate: 101  (01/10 0400) Intake/Output from previous day: 01/09 0701 - 01/10 0700 In: 3184 [P.O.:120; I.V.:1720; IV Piggyback:444; TPN:900] Out: 3 [Urine:1; Stool:2] Intake/Output from this shift:   Labs:  [facility] 08/28/12 0430 08/27/12 1852  WBC 13.3* 16.6*  HGB 10.1* 10.4*  HCT 30.6* 30.4*  PLT 280 274  APTT -- --  INR -- --    Basename 08/29/12 0400 08/28/12 0430 08/27/12 1852  NA 134* 134* 133*  K 4.2 3.7 3.1*  CL 107 105 102  CO2 16* 21 22  GLUCOSE 94 92 106*  BUN 6 10 13   CREATININE 0.68 0.67 0.78  LABCREA -- -- --  CREAT24HRUR -- -- --  CALCIUM 8.3* 7.7* 7.2*  MG -- 1.7 --  PHOS 2.8 2.2* --  PROT -- 5.7* --  ALBUMIN -- 2.3* --  AST -- 17 --  ALT -- 29 --  ALKPHOS -- 99 --  BILITOT -- 0.3 --  BILIDIR -- -- --  IBILI -- -- --  PREALBUMIN -- -- --  TRIG -- -- --  CHOLHDL -- -- --  CHOL -- -- --   Estimated Creatinine Clearance: 75.9 ml/min (by C-G formula based on Cr of 0.68).    Basename 08/29/12 0813 08/28/12 2124 08/28/12 1631  GLUCAP 88 112* 173*   Medications:  Scheduled:     . albuterol  2.5 mg Nebulization Q6H  . calcium carbonate  1 tablet Oral TID  . heparin  5,000 Units Subcutaneous Q8H  . insulin aspart  0-5 Units Subcutaneous QHS  . insulin aspart  0-9 Units Subcutaneous TID WC  . lipase/protease/amylase  4 capsule Oral TID AC  . multivitamin with minerals  1 tablet Oral Daily  . pantoprazole  40 mg Oral Daily  . piperacillin-tazobactam (ZOSYN)  IV  3.375 g Intravenous Q8H  . potassium chloride  30 mEq Oral TID  . sodium chloride  250 mL  Intravenous Once  . [COMPLETED] sodium phosphate  Dextrose 5% IVPB  10 mmol Intravenous Once  . [DISCONTINUED] metroNIDAZOLE  500 mg Oral Q8H   Infusions:     . sodium chloride 20 mL/hr at 08/28/12 0147  . dextrose 5 % and 0.9 % NaCl with KCl 20 mEq/L 50 mL/hr at 08/29/12 0805  . [EXPIRED] TPN (CLINIMIX) +/- additives     And  . [EXPIRED] fat emulsion    . [DISCONTINUED] TPN (CLINIMIX) +/- additives     Insulin Requirements in the past 24 hours 3 units/24 hr Sensitive scale ac/hs  Nutritional Goals:  Per 12/31 RD note: Kcal: 1750-1950,  Protein: 70-90g,  Fluid: > 1.9L/day  Goal TNA: Clinimix E 5/20 @ 32ml/hr + 20% IV lipids on MWF will provide 96 g protein/day and 1896 kcal/day avg (1689 kcal on non-lipid days, 2169 kcal on lipid days)  Current Nutrition:  FL diet from 1/8 IVF: D5NS with 20 mEq of K+ @ 50 ml/hr (IV fluids per MD) TNA: Clinimix E5/20 @ 80 ml/hr, Lipids MWF only  Assessment: 67 yo M with pancreatic cancer admitted 08/18/12 with CC: abdominal pain. Pt is s/p Whipple on  06/06/12 and s/p chemo on 08/14/12. Prior to admission, patient reported 10-15 days of abdominal pain, diarrhea, nausea, and vomiting.  Pt was started on TNA via PAC for nutritional support 1/3. Per RD note, patient is classified as severely malnourished with ~16% weight loss (25 lbs) in the past 2 months and poor PO intake ~ 3 days prior to admission.   Labs  Lytes: Na remains low (cannot adjust Na+ conc of TNA). Corrected Ca wnl,Phos low end of normal; repletion 1/9  Renal/LFTs: wnl, note urine output not measured  Lipid Profile: TG 121 (1/6)  Prealbumin 9 (1/4), 11.7 (1/6)  1/8 pm, pt was unable to tolerate IV KCl runs via peripheral line, so TNA was stopped for 4 hours and the TNA line was used to infuse the K-runs. TNA needs to have a dedicated line due to infection risk. Was discussed with Dr. Donell Beers (via an OR RN) - suggested to d/c the TNA; however, MD would like to continue it for now  since patient reportedly is not eating his diet. Recommend adding an additional central line if possible, to avoid using the TNA line. OR RN to discuss with Dr. Donell Beers.  Plan  Reduce TNA rate to 60 ml/hr with FL diet, try not to blunt appetite.  Defer managament of mIVF to MD (fluids changed and increased on 1/8 per MD)  Lipids to be given on MWF only due to national shortage  Multivitamin and trace elements to be provided orally since patient currently tolerating other oral meds  Will order CBGs and sensitive SSI to make sure pt is tolerating TNA - will order TID+HS since pt technically has a diet order.  Suggested additional central line if meds cannot be given orally.  Otho Bellows PharmD Pager: [phone] 08/29/2012 8:35 AM   Addendum 0900  Noted that TNA had been stopped last night, and not resumed, CBG 88 this am, now on FL diet and advancing as tolerated. Labs, CBG's, insulin discontinued.   Otho Bellows PharmD 9:10 AM

## 2012-08-29 NOTE — Progress Notes (Signed)
NUTRITION FOLLOW UP  Intervention:  1.  General healthful diet; encourage intake as able.  Continue to monitor intake.  Nutrition Dx:  Unintended weight loss - improved, pt up 1 pound since admission, however this may be fluid related.  Ongoing. Altered GI function related to small bowel enteritis as evidenced by MD notes.  Resolving.  Goal:  Pt to consume >90% of meals - improved, intake mostly 75-100% of meals.   Monitor:  Weights, labs, TPN, diet advancement, BM, nausea/vomiting  Assessment:   Pt with small bowel enteritis previously maintained on TPN has been able to advance diet today.   RD planning to assess tolerance, however pt in with RN at time of visit-not available.   TPN was discontinued last night and diet advanced to Regular.  Wt overall stable during admission. Note possible d/c if tolerating diet.  Height: Ht Readings from Last 1 Encounters:  08/29/12 5\' 6"  (1.676 m)    Weight Status:   Wt Readings from Last 1 Encounters:  08/29/12 130 lb 15.3 oz (59.4 kg)    Re-estimated needs:  Kcal: 1750-1950 Protein: 80-90g Fluid: >1.8 L/day  Skin: intact  Diet Order: General   Intake/Output Summary (Last 24 hours) at 08/29/12 1624 Last data filed at 08/29/12 1200  Gross per 24 hour  Intake   1576 ml  Output      1 ml  Net   1575 ml    Last BM: 1/9   Labs:   Lab 08/29/12 0400 08/28/12 0430 08/27/12 1852 08/25/12 0520 08/23/12 0429  NA 134* 134* 133* -- --  K 4.2 3.7 3.1* -- --  CL 107 105 102 -- --  CO2 16* 21 22 -- --  BUN 6 10 13  -- --  CREATININE 0.68 0.67 0.78 -- --  CALCIUM 8.3* 7.7* 7.2* -- --  MG -- 1.7 -- 1.7 1.8  PHOS 2.8 2.2* -- 3.0 --  GLUCOSE 94 92 106* -- --    CBG (last 3)   Basename 08/29/12 1111 08/29/12 0813 08/28/12 2124  GLUCAP 88 88 112*    Scheduled Meds:   . albuterol  2.5 mg Nebulization Q6H  . calcium carbonate  1 tablet Oral TID  . heparin  5,000 Units Subcutaneous Q8H  . levofloxacin  750 mg Oral Daily  .  lipase/protease/amylase  4 capsule Oral TID AC  . multivitamin with minerals  1 tablet Oral Daily  . pantoprazole  40 mg Oral Daily  . potassium chloride  20 mEq Oral TID  . sodium chloride  250 mL Intravenous Once    Continuous Infusions:   . sodium chloride 20 mL/hr (08/29/12 1621)    Loyce Dys, MS RD LDN Clinical Inpatient Dietitian Pager: [phone] Weekend/After hours pager: [phone]

## 2012-08-30 LAB — BASIC METABOLIC PANEL
CO2: 17 mEq/L — ABNORMAL LOW (ref 19–32)
Glucose, Bld: 85 mg/dL (ref 70–99)
Potassium: 4.1 mEq/L (ref 3.5–5.1)
Sodium: 134 mEq/L — ABNORMAL LOW (ref 135–145)

## 2012-08-30 LAB — CBC
Hemoglobin: 11 g/dL — ABNORMAL LOW (ref 13.0–17.0)
MCH: 27 pg (ref 26.0–34.0)
RBC: 4.07 MIL/uL — ABNORMAL LOW (ref 4.22–5.81)

## 2012-08-30 MED ORDER — LEVOFLOXACIN 750 MG PO TABS: 750.0000 mg | ORAL_TABLET | Freq: Every day | ORAL | Status: DC

## 2012-08-30 NOTE — Progress Notes (Signed)
Patient discharged per MD order. PAC deaccessed. DC instructions reviewed with patient and wife at bedside. One prescription given to the patient and patient stated he will take it to [facility] today to fill it. Pt also states understanding of when his followup appointments are. Patient wheeled outside to the car by this nurse. Angelena Form, RN

## 2012-08-30 NOTE — Progress Notes (Signed)
Patient ID: Colin Castro, male   DOB: 1945/12/09, 67 y.o.   MRN: [id number] [facility] Surgery Progress Note:   * No surgery found *  Subjective: Mental status is alert.  No complaints Objective: Vital signs in last 24 hours: Temp:  [97.5 F (36.4 C)-100.3 F (37.9 C)] 100.3 F (37.9 C) (01/11 0555) Pulse Rate:  [76-86] 86  (01/11 0555) Resp:  [20-30] 20  (01/11 0555) BP: (105-117)/(58-67) 113/64 mmHg (01/11 0555) SpO2:  [95 %-100 %] 95 % (01/11 0555) Weight:  [130 lb 15.3 oz (59.4 kg)] 130 lb 15.3 oz (59.4 kg) (01/10 1330)  Intake/Output from previous day: 01/10 0701 - 01/11 0700 In: 1262 [P.O.:540; I.V.:722] Out: -  Intake/Output this shift:    Physical Exam: Work of breathing is appears stable.  No abdominal complaints  Lab Results:  Results for orders placed during the hospital encounter of 08/18/12 (from the past 48 hour(s))  GLUCOSE, CAPILLARY     Status: Abnormal   Collection Time   08/28/12 11:41 AM      Component Value Range Comment   Glucose-Capillary 128 (*) 70 - 99 mg/dL    Comment 1 Documented in Chart      Comment 2 Notify RN     GLUCOSE, CAPILLARY     Status: Abnormal   Collection Time   08/28/12  4:31 PM      Component Value Range Comment   Glucose-Capillary 173 (*) 70 - 99 mg/dL    Comment 1 Documented in Chart      Comment 2 Notify RN     GLUCOSE, CAPILLARY     Status: Abnormal   Collection Time   08/28/12  9:24 PM      Component Value Range Comment   Glucose-Capillary 112 (*) 70 - 99 mg/dL    Comment 1 Notify RN     BASIC METABOLIC PANEL     Status: Abnormal   Collection Time   08/29/12  4:00 AM      Component Value Range Comment   Sodium 134 (*) 135 - 145 mEq/L    Potassium 4.2  3.5 - 5.1 mEq/L    Chloride 107  96 - 112 mEq/L    CO2 16 (*) 19 - 32 mEq/L    Glucose, Bld 94  70 - 99 mg/dL    BUN 6  6 - 23 mg/dL    Creatinine, Ser 8.29  0.50 - 1.35 mg/dL    Calcium 8.3 (*) 8.4 - 10.5 mg/dL    GFR calc non Af Amer >90  >90 mL/min    GFR calc Af  Amer >90  >90 mL/min   PHOSPHORUS     Status: Normal   Collection Time   08/29/12  4:00 AM      Component Value Range Comment   Phosphorus 2.8  2.3 - 4.6 mg/dL   GLUCOSE, CAPILLARY     Status: Normal   Collection Time   08/29/12  8:13 AM      Component Value Range Comment   Glucose-Capillary 88  70 - 99 mg/dL   GLUCOSE, CAPILLARY     Status: Normal   Collection Time   08/29/12 11:11 AM      Component Value Range Comment   Glucose-Capillary 88  70 - 99 mg/dL   CBC     Status: Abnormal   Collection Time   08/30/12  4:04 AM      Component Value Range Comment   WBC 10.1  4.0 - 10.5  K/uL    RBC 4.07 (*) 4.22 - 5.81 MIL/uL    Hemoglobin 11.0 (*) 13.0 - 17.0 g/dL    HCT 40.9 (*) 81.1 - 52.0 %    MCV 81.1  78.0 - 100.0 fL    MCH 27.0  26.0 - 34.0 pg    MCHC 33.3  30.0 - 36.0 g/dL    RDW 91.4 (*) 78.2 - 15.5 %    Platelets 392  150 - 400 K/uL   BASIC METABOLIC PANEL     Status: Abnormal   Collection Time   08/30/12  4:04 AM      Component Value Range Comment   Sodium 134 (*) 135 - 145 mEq/L    Potassium 4.1  3.5 - 5.1 mEq/L    Chloride 106  96 - 112 mEq/L    CO2 17 (*) 19 - 32 mEq/L    Glucose, Bld 85  70 - 99 mg/dL    BUN 6  6 - 23 mg/dL    Creatinine, Ser 9.56  0.50 - 1.35 mg/dL    Calcium 8.4  8.4 - 21.3 mg/dL    GFR calc non Af Amer >90  >90 mL/min    GFR calc Af Amer >90  >90 mL/min     Radiology/Results: No results found.  Anti-infectives: Anti-infectives     Start     Dose/Rate Route Frequency Ordered Stop   08/30/12 1000   levofloxacin (LEVAQUIN) tablet 750 mg     Comments: Pharmacy may adjust      750 mg Oral Daily 08/29/12 1519     08/27/12 1800   piperacillin-tazobactam (ZOSYN) IVPB 3.375 g  Status:  Discontinued        3.375 g 12.5 mL/hr over 240 Minutes Intravenous Every 8 hours 08/27/12 1722 08/29/12 1519   08/21/12 2000   ciprofloxacin (CIPRO) tablet 500 mg  Status:  Discontinued        500 mg Oral 2 times daily 08/21/12 1424 08/26/12 1544   08/19/12  0100   ciprofloxacin (CIPRO) IVPB 400 mg  Status:  Discontinued        400 mg 200 mL/hr over 60 Minutes Intravenous 2 times daily 08/19/12 0051 08/21/12 1423   08/19/12 0100   metroNIDAZOLE (FLAGYL) tablet 500 mg  Status:  Discontinued        500 mg Oral 3 times per day 08/19/12 0051 08/28/12 1357   08/18/12 2330   Ampicillin-Sulbactam (UNASYN) 3 g in sodium chloride 0.9 % 100 mL IVPB        3 g 100 mL/hr over 60 Minutes Intravenous  Once 08/18/12 2315 08/19/12 0100          Assessment/Plan: Problem List: Patient Active Problem List  Diagnosis  . Ampullary carcinoma  . Pneumonia  . Cancer    Resolving enteritis  * No surgery found *    LOS: 12 days   Matt B. Daphine Deutscher, MD, [facility] [phone] beeper [phone]  08/30/2012 9:33 AM

## 2012-08-30 NOTE — Discharge Summary (Signed)
NAMEZEBULON, Colin Castro NO.:  [id number]  MEDICAL RECORD NO.:  [id number]  LOCATION:  1342                         FACILITY:  [facility]  PHYSICIAN:  Eduardo Osier. Sharyn Lull, M.D. DATE OF BIRTH:  [birth year]  DATE OF ADMISSION:  08/18/2012 DATE OF DISCHARGE:  08/30/2012                              DISCHARGE SUMMARY   ADMITTING DIAGNOSES: 1. Abdominal pain, rule out enteritis. 2. Papillary carcinoma of the pancreas. 3. Hypertension. 4. Coronary artery disease. 5. Status post Whipple procedure. 6. Status post pancreatic stent placement.  FINAL DIAGNOSES: 1. Status post abdominal pain secondary to enteritis probably     secondary to chemotherapy. 2. Resolving right lower lobe pneumonia. 3. Papillary carcinoma. 4. Hypertension. 5. Coronary artery disease. 6. Status post Whipple procedure in the past. 7. Status post pancreatic stent placement.  DISCHARGE MEDICATIONS: 1. Levaquin 750 mg 1 tablet daily for seven more days. 2. The patient has been advised to continue Tylenol 500 mg every 6     hours as needed for pain. 3. Atarax 10 mg 2 times daily for itching as needed. 4. Pancrease 2 capsules 3 times daily as before. 5. Lorazepam 0.5 mg every 6 hours as needed. 6. Megace 400 mg by mouth twice daily. 7. Multivitamin with mineral 1 tablet daily. 8. Zofran 4 mg every 6 hours as needed for nausea. 9. Percocet 1-2 tablets every 4 hours as needed for pain. 10.Protonix 40 mg p.o. daily. 11.MiraLax 17 g by mouth daily.  DIET:  Low salt, low cholesterol as tolerated.  FOLLOWUP:  With Dr. Algie Coffer in 1 week.  Follow up with Surgery as scheduled.  Follow up with Oncology as scheduled.  CONDITION AT DISCHARGE:  Stable.  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  He had no nausea, vomiting, or diarrhea.  He is having normal bowel movements.  He denies any dysuria or testicular pain.  Denies any chest pain, cough, shortness of breath or fever.  No altered mental status.  He was seen in the Cancer Clinic today and was instructed to come to the emergency room. He had CT scan and PET scan this morning showing questionable metastasis to the liver, otherwise no acute changes were noted.  PHYSICAL EXAMINATION:  VITAL SIGNS:  His blood pressure was 110/80, pulse was 90, he was afebrile. GENERAL:  Alert, awake, oriented to time, place, and person. HEENT.  Head normocephalic, atraumatic. NECK:  Supple.  No JVD.  No bruit. LUNGS:  Clear to auscultation without rhonchi or rales. CARDIOVASCULAR:  S1, S2 was normal.  There was soft systolic murmur.  No S3 gallop. ABDOMEN:  Soft.  There was mild epigastric and right lower quadrant tenderness.  There was no rebound or guarding. EXTREMITIES:  There was no clubbing, cyanosis, or edema.  LABORATORY DATA:  His sodium  was 135, potassium 4.4, BUN 14, creatinine 0.74, glucose was 100, repeat fasting sugar was 94.  His liver enzymes were elevated.  Hemoglobin was 13.6, hematocrit 39.4, white count of 4.1.  Labs today, hemoglobin is 11, hematocrit 33, white count of 10.1. Sodium is 134, potassium 4.1, BUN 6, creatinine 0.7.  Chest x-ray showed resolving right lower lobe infiltrate.  BRIEF HOSPITAL COURSE:  The patient was admitted to regular floor initially and was kept n.p.o. and was started on tPN.  GI consultation was obtained and the patient was started on initially Cipro and Flagyl without much improvement.  The patient spiked fever.  C. diff toxins were negative.  The patient spiked  fevers and subsequently was noted to have right lower lobe infiltrate, was transferred to ICU, was treated appropriately with antibiotics with improvement in his symptoms.  IV antibiotics have been switched to Levaquin p.o. which he is tolerating well.  The patient do not have significant diarrhea.  The patient remains afebrile and is eager to go home and will be discharged.  His diet was gradually increased which he is tolerating it well.  The patient will be discharged home and will be followed up as an outpatient by Dr. Algie Coffer in 1 week and Surgery and Oncology as scheduled.     Eduardo Osier. Sharyn Lull, M.D.     MNH/MEDQ  D:  08/30/2012  T:  08/30/2012  Job:  [id number]

## 2012-09-03 LAB — CULTURE, BLOOD (ROUTINE X 2): Culture: NO GROWTH

## 2012-09-04 ENCOUNTER — Other Ambulatory Visit (HOSPITAL_BASED_OUTPATIENT_CLINIC_OR_DEPARTMENT_OTHER): Payer: Medicare Other | Admitting: Lab

## 2012-09-04 DIAGNOSIS — C241 Malignant neoplasm of ampulla of Vater: Secondary | ICD-10-CM

## 2012-09-04 LAB — CBC WITH DIFFERENTIAL/PLATELET
Basophils Absolute: 0.1 10*3/uL (ref 0.0–0.1)
Eosinophils Absolute: 0.2 10*3/uL (ref 0.0–0.5)
HCT: 36.8 % — ABNORMAL LOW (ref 38.4–49.9)
HGB: 12.6 g/dL — ABNORMAL LOW (ref 13.0–17.1)
LYMPH%: 26.5 % (ref 14.0–49.0)
MCV: 81.3 fL (ref 79.3–98.0)
MONO%: 9.7 % (ref 0.0–14.0)
NEUT#: 7.8 10*3/uL — ABNORMAL HIGH (ref 1.5–6.5)
Platelets: 449 10*3/uL — ABNORMAL HIGH (ref 140–400)

## 2012-09-04 LAB — TECHNOLOGIST REVIEW

## 2012-09-04 LAB — COMPREHENSIVE METABOLIC PANEL (CC13)
ALT: 26 U/L (ref 0–55)
AST: 20 U/L (ref 5–34)
Albumin: 2.6 g/dL — ABNORMAL LOW (ref 3.5–5.0)
Alkaline Phosphatase: 113 U/L (ref 40–150)
BUN: 14 mg/dL (ref 7.0–26.0)
CO2: 20 meq/L — ABNORMAL LOW (ref 22–29)
Calcium: 9.1 mg/dL (ref 8.4–10.4)
Chloride: 110 meq/L — ABNORMAL HIGH (ref 98–107)
Creatinine: 0.9 mg/dL (ref 0.7–1.3)
Glucose: 114 mg/dL — ABNORMAL HIGH (ref 70–99)
Potassium: 3.5 meq/L (ref 3.5–5.1)
Sodium: 140 meq/L (ref 136–145)
Total Bilirubin: 0.37 mg/dL (ref 0.20–1.20)
Total Protein: 7.9 g/dL (ref 6.4–8.3)

## 2012-09-05 ENCOUNTER — Encounter: Payer: Self-pay | Admitting: *Deleted

## 2012-09-05 ENCOUNTER — Telehealth: Payer: Self-pay | Admitting: *Deleted

## 2012-09-05 NOTE — Telephone Encounter (Signed)
Per MD, notified pt will have f/u appt Monday at 10:45am. Onc tx sent

## 2012-09-05 NOTE — Telephone Encounter (Signed)
Per MD- pt to be seen 09/08/12 by MD at 10:00. pt has been notified of the change

## 2012-09-05 NOTE — Telephone Encounter (Signed)
Message copied by GARNER, Gerald Leitz on Fri Sep 05, 2012  9:48 AM ------      Message from: Victorino December      Created: Fri Sep 05, 2012  9:02 AM       Call patient: patient needs a follow up with Melissa on Monday, please get CBC/CMET

## 2012-09-08 ENCOUNTER — Encounter: Payer: Self-pay | Admitting: Oncology

## 2012-09-08 ENCOUNTER — Telehealth: Payer: Self-pay | Admitting: Oncology

## 2012-09-08 ENCOUNTER — Ambulatory Visit (HOSPITAL_BASED_OUTPATIENT_CLINIC_OR_DEPARTMENT_OTHER): Payer: Medicare Other | Admitting: Oncology

## 2012-09-08 ENCOUNTER — Other Ambulatory Visit: Payer: Self-pay | Admitting: Medical Oncology

## 2012-09-08 VITALS — BP 123/88 | HR 103 | Temp 96.0°F | Resp 20 | Ht 66.0 in | Wt 125.8 lb

## 2012-09-08 DIAGNOSIS — C241 Malignant neoplasm of ampulla of Vater: Secondary | ICD-10-CM

## 2012-09-08 DIAGNOSIS — Z23 Encounter for immunization: Secondary | ICD-10-CM

## 2012-09-08 DIAGNOSIS — C801 Malignant (primary) neoplasm, unspecified: Secondary | ICD-10-CM

## 2012-09-08 MED ORDER — INFLUENZA VIRUS VACC SPLIT PF IM SUSP
0.5000 mL | Freq: Once | INTRAMUSCULAR | Status: AC
  Administered 2012-09-08: 0.5 mL via INTRAMUSCULAR
  Filled 2012-09-08: qty 0.5

## 2012-09-08 NOTE — Telephone Encounter (Signed)
gv pt and interpreter appt schedule for January and February. Interpreter aware clinical social work will make contact w/language resource re interpreter for next appts.

## 2012-09-08 NOTE — Patient Instructions (Addendum)
Proceed with radiation therapy concurrently with it we will give you xeloda chemo pill  I will see you back in 1 month

## 2012-09-08 NOTE — Progress Notes (Signed)
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 He underwent pancreatic code duodenectomy. His final pathology revealed a well to moderately differentiated adenocarcinoma the tumor arose from high grade dysplasia involving small bowel mucosa in the ampulla invaded into the pancreas margins. The margins were negative LV I was noted one lymph node was positive for metastatic disease additional 14 lymph nodes were negative. Portal lymph node resection revealed 5 benign lymph nodes. Pathologically T3 N1.  #2 patient is now beginning adjuvant chemotherapy consisting of Gemzar x3 weeks.  #3 he was seen by Dr. Antony Blackbird for adjuvant radiation therapy with radiosensitizing Xeloda.  CURRENT THERAPY: Patient will proceed with week #3 cycle 1 of Gemzar.  INTERVAL HISTORY: Colin Castro 67 y.o. male returns for followup visit he is accompanied by his wife.  Patient looks very weak tired and fatigued. He is however not having any abdominal pain. As you recall he was recently admitted to [facility] long for abdominal pain and small bowel extraction. He is beginning to eat again. However his main primary concern is weakness. He has had significant weight loss. He denies any fevers chills night sweats headaches and has no shortness of breath no chest pains or palpitations no myalgias and arthralgias remainder of the 10 point review of systems is negative MEDICAL HISTORY:  Past Medical History  Diagnosis Date  . Hypertension   . High cholesterol   . Coronary artery disease   . Common bile duct (CBD) stricture     Malignant  . 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 Pneumonia   . Constipation   . Myocardial infarction     15 years ago    ALLERGIES:   has no known allergies.  MEDICATIONS:  Current Outpatient Prescriptions  Medication Sig Dispense Refill  . acetaminophen (TYLENOL) 500 MG tablet Take 500 mg by mouth every 6 (six) hours as needed. For pain      . hydrOXYzine (ATARAX/VISTARIL) 10 MG tablet Take 10 mg by mouth 2 (two) times daily as needed. For itching      . levofloxacin (LEVAQUIN) 750 MG tablet Take 1 tablet (750 mg total) by mouth daily.  7 tablet  0  . lipase/protease/amylase (CREON-10/PANCREASE) 12000 UNITS CPEP Take 2 capsules by mouth 3 (three) times daily before meals.  270 capsule  2  . LORazepam (ATIVAN) 0.5 MG tablet Take 1 tablet (0.5 mg total) by mouth every 6 (six) hours as needed (Nausea or vomiting).  30 tablet  0  . megestrol (MEGACE) 400 MG/10ML suspension Take 400 mg by mouth 2 (two) times daily.       . Multiple Vitamin (MULTIVITAMIN WITH MINERALS) TABS Take 1 tablet by mouth daily.      . ondansetron (ZOFRAN) 4 MG tablet Take 4 mg by mouth every 6 (six) hours as needed. For nausea      . oxyCODONE-acetaminophen (PERCOCET/ROXICET) 5-325 MG per tablet Take 1-2 tablets by mouth every 4 (four) hours as needed. For pain  30 tablet  0  . pantoprazole (PROTONIX) 40 MG tablet Take 40 mg by mouth daily.      . polyethylene glycol powder (MIRALAX) powder Take 17 g by  mouth daily.  255 g  4    SURGICAL HISTORY:  Past Surgical History  Procedure Date  . Ercp 05/07/2012    Procedure: ENDOSCOPIC RETROGRADE CHOLANGIOPANCREATOGRAPHY (ERCP);  Surgeon: [name], MD;  Location: [facility];  Service: Endoscopy;  Laterality: N/A;  Dr [name]  . Eus 05/07/2012    Procedure: UPPER ENDOSCOPIC ULTRASOUND (EUS) RADIAL;  Surgeon: [name], MD;  Location: [facility] ENDOSCOPY;  Service: Endoscopy;;  . Appendectomy     open, 25 yrs ago  . Coronary stent placement     2 stents placed  . Whipple  procedure 06/06/2012    Procedure: WHIPPLE PROCEDURE;  Surgeon: [name], MD;  Location: [facility] OR;  Service: General;  Laterality: N/A;  Pancreatico duodenectomy  . Cholecystectomy 06/06/2012    Procedure: CHOLECYSTECTOMY;  Surgeon: [name], MD;  Location[facility];  Service: General;  Laterality: N/A;  . Pancreatic stent placement 06/06/2012    Procedure: PANCREATIC STENT PLACEMENT;  Surgeon: [name], MD;  Location: [facility] OR;  Service: General;  Laterality: N/A;  Removal of biliary stent; Placement of pacreatic stent  . Lymph node dissection 06/06/2012    Procedure: LYMPH NODE DISSECTION;  Surgeon: [name], MD;  Location: [facility] OR;  Service: General;  Laterality: N/A;  Portal lymph node dissection  . Coronary angioplasty     8 or 9 years ago  . Portacath placement 07/24/2012    Procedure: INSERTION PORT-A-CATH;  Surgeon: [name], MD;  Location: [facility] OR;  Service: General;  Laterality: Left;    REVIEW OF SYSTEMS:   General: fatigue (+), night sweats (-), fever (-), pain (-) Lymph: palpable nodes (-) HEENT: vision changes (-), mucositis (-), gum bleeding (-), epistaxis (-) Cardiovascular: chest pain (-), palpitations (-) Pulmonary: shortness of breath (-), dyspnea on exertion (-), cough (-), hemoptysis (-) GI:  Early satiety (-), melena (-), dysphagia (-), nausea/vomiting (-), diarrhea (-) GU: dysuria (-), hematuria (-), incontinence (-) Musculoskeletal: joint swelling (-), joint pain (-), back pain (-) Neuro: weakness (-), numbness (-), headache (-), confusion (-) Skin: Rash (-), lesions (-), dryness (-) Psych: depression (-), suicidal/homicidal ideation (-), feeling of hopelessness (-)   PHYSICAL EXAMINATION: Blood pressure 123/88, pulse 103, temperature 96 F (35.6 C), temperature source Oral, resp. rate 20, height 5\' 6"  (1.676 m), weight 125 lb 12.8 oz (57.063 kg). Body mass index is 20.30 kg/(m^2). General: Patient is a well appearing male in no acute distress HEENT:  PERRLA, sclerae anicteric no conjunctival pallor, MMM Neck: supple, no palpable adenopathy Lungs: clear to auscultation bilaterally, no wheezes, rhonchi, or rales Cardiovascular: tachycardic, regular rhythm, S1, S2, no murmurs, rubs or gallops Abdomen: Soft, non-distended, normoactive bowel sounds, no HSM, extremely tender to palpation, particularly the RLQ, with rebound.  Patient guarding abdomen when moving on exam table and grimacing while moving.  Extremities: warm and well perfused, no clubbing, cyanosis, or edema Skin: No rashes or lesions Neuro: Non-focal ECOG PERFORMANCE STATUS: 1 - Symptomatic but completely ambulatory   LABORATORY DATA: Lab Results  Component Value Date   WBC 12.7* 09/04/2012   HGB 12.6* 09/04/2012   HCT 36.8* 09/04/2012   MCV 81.3 09/04/2012   PLT 449* 09/04/2012      Chemistry      Component Value Date/Time   NA 140 09/04/2012 1402   NA 134* 08/30/2012 0404   K 3.5 09/04/2012 1402   K 4.1 08/30/2012 0404   CL 110* 09/04/2012 1402   CL 106 08/30/2012 0404   CO2 20*  09/04/2012 1402   CO2 17* 08/30/2012 0404   BUN 14.0 09/04/2012 1402   BUN 6 08/30/2012 0404   CREATININE 0.9 09/04/2012 1402   CREATININE 0.75 08/30/2012 0404      Component Value Date/Time   CALCIUM 9.1 09/04/2012 1402   CALCIUM 8.4 08/30/2012 0404   ALKPHOS 113 09/04/2012 1402   ALKPHOS 99 08/28/2012 0430   AST 20 09/04/2012 1402   AST 17 08/28/2012 0430   ALT 26 09/04/2012 1402   ALT 29 08/28/2012 0430   BILITOT 0.37 09/04/2012 1402   BILITOT 0.3 08/28/2012 0430       RADIOGRAPHIC STUDIES:    ASSESSMENT: 67 year old gentleman with  #1 periampullary carcinoma stage III patient is status post Whipple procedure overall she's done well with it. You will not receive adjuvant chemotherapy initially consisting of Gemzar for one cycle. He will then go on to receive concomitant chemoradiation with chemotherapy consisting of xeloda. Risks and benefits of treatment have been discussed with them  completely.  #2 patient has now completed the initial chemotherapy of one cycle of Gemzar. Unfortunately his course was complicated by development of abdominal pain weakness and fatigue and therefore he was admitted to the hospital with a small bowel obstruction this is now resolved.  #3 patient is now ready to get started on radiation therapy I have recommended this to the patient as well as his wife and through the interpreter. Certainly he will need Xeloda concurrently with the radiation. However due to patient's comorbidity I do think that we will have to reduce the dose is a low.during radiation therapy. They do understand this. I will make a referral back to Dr. Celedonio Savage.    PLAN:   #1 patient will be referred to radiation oncology. We will try to get xeloda approved for the patient through patient assistance.  #2 I will plan on seeing him back in one month's time or sooner if need arises    All questions were answered. The patient knows to call the clinic with any problems, questions or concerns. We can certainly see the patient much sooner if necessary.  I spent 25 minutes counseling the patient face to face. The total time spent in the appointment was 30 minutes.  Drue Second, MD Medical/Oncology [facility] [phone] (beeper) [phone] (Office)  09/08/2012, 11:09 AM

## 2012-09-10 ENCOUNTER — Ambulatory Visit: Payer: Medicare Other | Admitting: Nutrition

## 2012-09-10 NOTE — Progress Notes (Signed)
Patient is a 67 year old male diagnosed with ampullary cancer. He is a patient of Dr.Khan.  Past medical history includes hypertension, hypercholesterolemia, CAD, common bile duct stricture, pneumonia, constipation, and MRI. He also is is status post a resolved small bowel obstruction.  Medications include Creon, Ativan, Megace, multivitamin, Zofran, Protonix, and MiraLax.   Labs include: Glucose 114 and albumin 2.6 on January 16.  Height: 66 inches. Weight 125.8 January 20. Usual body weight 145 pounds October 2013. BMI: 20.31.  Patient, wife and interpreter present to nutrition consult. Patient has recently been in the hospital. He reports weight loss and weakness because of recent hospitalization. Patient does seem to consume a variety of protein foods including eggs, chicken, milk products, and whole gr. He is eating 4 times daily at this time. He describes no nutrition problems. He denies problems with his bowels.  Nutrition diagnosis: Food and nutrition related knowledge deficit related to recent diagnosis of ampullary cancer as evidenced by no prior need for nutrition related information.  Intervention: I educated patient to consume higher protein, higher calorie foods in 6 small meals and snacks daily. We have reviewed ways to put snacks together using foods patient consumes. I have encouraged a protein foods at every meal and snack. Teach back method used. I have also provided patient with oral nutrition supplement samples as well as coupons and fact sheets.  Monitoring, evaluation, goals: Patient will tolerate oral intake to minimize further weight loss and promote adequate nutrition for healing.  Next visit: There is no visit scheduled. Patient has my contact information and will call with questions.

## 2012-09-12 ENCOUNTER — Encounter: Payer: Self-pay | Admitting: Radiation Oncology

## 2012-09-12 NOTE — Progress Notes (Signed)
67 year old male. Married.  Stage III periampullary carcinoma s/p Whipple. Patient completed initial chemotherapy of one cycle of Gemzar complicated by weakness, fatigue, pain, and small bowel obstruction. Referred by Dr. Welton Flakes to start radiation therapy with concurrent Xeloda. Xeloda has been approved for this patient through patient assistance.   NKDA No previous hx of radiation therapy No indication of a pacemaker

## 2012-09-15 ENCOUNTER — Encounter: Payer: Self-pay | Admitting: Radiation Oncology

## 2012-09-15 ENCOUNTER — Ambulatory Visit
Admission: RE | Admit: 2012-09-15 | Discharge: 2012-09-15 | Disposition: A | Discharge status: Home / Self Care | Payer: Medicare Other | Source: Ambulatory Visit | Attending: Radiation Oncology | Admitting: Radiation Oncology

## 2012-09-15 VITALS — BP 121/78 | HR 69 | Temp 98.0°F | Resp 18 | Ht 66.0 in | Wt 127.1 lb

## 2012-09-15 DIAGNOSIS — Z79899 Other long term (current) drug therapy: Secondary | ICD-10-CM | POA: Insufficient documentation

## 2012-09-15 DIAGNOSIS — C241 Malignant neoplasm of ampulla of Vater: Secondary | ICD-10-CM

## 2012-09-15 DIAGNOSIS — K5900 Constipation, unspecified: Secondary | ICD-10-CM | POA: Insufficient documentation

## 2012-09-15 DIAGNOSIS — Z510 Encounter for antineoplastic radiation therapy: Secondary | ICD-10-CM | POA: Insufficient documentation

## 2012-09-15 DIAGNOSIS — R5381 Other malaise: Secondary | ICD-10-CM | POA: Insufficient documentation

## 2012-09-15 NOTE — Progress Notes (Signed)
Patient presents to the clinic today accompanied by his wife and the interpretor for follow up new consult with Dr. Roselind Messier. Patient alert and oriented to person, place, and time. No distress noted. Steady gait noted. Pleasant affect noted. Patient denies pain at this time. Patient denies nausea, vomiting, headache or dizziness. Patient reports weakness. Patient reports that foods do not taste good. Patient reports he can not afford megace prescribed by Dr. Donell Beers. Patient trying to eat small frequent meals. Patient reports problems with constipation. Patient reports that Dr. Welton Flakes advised on constipation but, he has not been compliant. Patient reports that his healthy weight is 145 lb. Patient reports he is unable to sleep but, sleep aid prescribed makes his body ache. Patient to see Dr. Laurelyn Sickle tomorrow for follow up and Dr. Welton Flakes 2/17. Reported all findings to Dr. Roselind Messier.

## 2012-09-15 NOTE — Progress Notes (Signed)
See progress note under physician encounter. 

## 2012-09-15 NOTE — Progress Notes (Signed)
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 He did complete one cycle of adjuvant chemotherapy. He is now seen to coordinate his radiation along with radiosensitizing chemotherapy.  He has had a fair amount of fatigue and has a poor appetite in addition he has had some problems with constipation.                        ALLERGIES:   has no known allergies.  Meds: Current Outpatient Prescriptions  Medication Sig Dispense Refill  . acetaminophen (TYLENOL) 500 MG tablet Take 500 mg by mouth every 6 (six) hours as needed. For pain      . hydrOXYzine (ATARAX/VISTARIL) 10 MG tablet Take 10 mg by mouth 2 (two) times daily as needed. For itching      . lipase/protease/amylase (CREON-10/PANCREASE) 12000 UNITS CPEP Take 2 capsules by mouth 3 (three) times daily before meals.  270 capsule  2  . LORazepam (ATIVAN) 0.5 MG tablet Take 1 tablet (0.5 mg total) by mouth every 6 (six) hours as needed (Nausea or vomiting).  30 tablet  0  . Multiple Vitamin (MULTIVITAMIN WITH MINERALS) TABS Take 1 tablet by mouth daily.      . pantoprazole (PROTONIX) 40 MG tablet Take 40 mg by mouth daily.      . polyethylene glycol powder (MIRALAX) powder Take 17 g by mouth daily.  255 g  4  . capecitabine (XELODA) 150 MG tablet Take by mouth 2 (two) times daily after a meal.      . levofloxacin (LEVAQUIN) 750 MG tablet Take 1 tablet (750 mg total) by mouth daily.  7 tablet  0  . megestrol (MEGACE) 400 MG/10ML suspension Take 400 mg by mouth 2 (two) times daily.       . ondansetron (ZOFRAN) 4 MG tablet Take 4 mg by mouth every 6 (six) hours as needed. For nausea      . oxyCODONE-acetaminophen (PERCOCET/ROXICET) 5-325 MG per  tablet Take 1-2 tablets by mouth every 4 (four) hours as needed. For pain  30 tablet  0    Physical Findings: The patient is in no acute distress. Patient is alert and oriented.  height is 5\' 6"  (1.676 m) and weight is 127 lb 1.6 oz (57.652 kg). His oral temperature is 98 F (36.7 C). His blood pressure is 121/78 and his pulse is 69. His respiration is 18 and oxygen saturation is 100%. . No palpable supraclavicular or axillary adenopathy the lungs are clear to auscultation. The heart has regular rhythm and rate. The abdomen is soft and nontender with normal bowel sounds.  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  Patient has completed one cycle of adjuvant chemotherapy. He is now ready to begin his combined modality therapy. He does have a fair amount of fatigue and has had some problems with constipation. This constipation issue may change once he begins his abdominal radiation therapy.   Plan:  Simulation and planning later today. Patient began his combined treatment beginning the first week in February.  I anticipate 5 weeks of radiation therapy  _____________________________________  -----------------------------------  Billie Lade, PhD, Colin Castro

## 2012-09-15 NOTE — Progress Notes (Signed)
  Radiation Oncology         [phone] ________________________________  Name: Colin Castro MRN: [id number]  Date: 09/15/2012  DOB: 05-May-1946  SIMULATION AND TREATMENT PLANNING NOTE  DIAGNOSIS:  Stage IIB adenocarcinoma of the ampulla  NARRATIVE:  The patient was brought to the CT Simulation planning suite.  Identity was confirmed.  All relevant records and images related to the planned course of therapy were reviewed.  The patient freely provided informed written consent to proceed with treatment after reviewing the details related to the planned course of therapy. The consent form was witnessed and verified by the simulation staff.  Then, the patient was set-up in a stable reproducible  supine position for radiation therapy.  CT images were obtained.  Surface markings were placed.  The CT images were loaded into the planning software.  Then the target and avoidance structures were contoured.  Treatment planning then occurred.  The radiation prescription was entered and confirmed.  Then, I designed and supervised the construction of a total of 4 medically necessary complex treatment devices.  I have requested : 3D Simulation  I have requested a DVH of the following structures: CT of the, liver, left and right kidney spinal cord,.  I have ordered:Nutrition Consult  PLAN:  The patient will receive 45 Gy in 25 fractions.  ___________   Special treatment procedure note  Patient will be receiving radiosensitizing chemotherapy throughout his radiation treatments. Given the increased potential for toxicities as well as the necessity for close monitoring of the patient and bloodwork, this constitutes a special treatment procedure  _____________________  -----------------------------------  Billie Lade, PhD, MD

## 2012-09-16 ENCOUNTER — Encounter (INDEPENDENT_AMBULATORY_CARE_PROVIDER_SITE_OTHER): Payer: Self-pay | Admitting: General Surgery

## 2012-09-16 ENCOUNTER — Ambulatory Visit (INDEPENDENT_AMBULATORY_CARE_PROVIDER_SITE_OTHER): Payer: Medicare Other | Admitting: General Surgery

## 2012-09-16 VITALS — BP 126/64 | HR 84 | Resp 20 | Ht 66.0 in | Wt 128.8 lb

## 2012-09-16 DIAGNOSIS — K8681 Exocrine pancreatic insufficiency: Secondary | ICD-10-CM | POA: Insufficient documentation

## 2012-09-16 DIAGNOSIS — K8689 Other specified diseases of pancreas: Secondary | ICD-10-CM

## 2012-09-16 DIAGNOSIS — C241 Malignant neoplasm of ampulla of Vater: Secondary | ICD-10-CM

## 2012-09-16 MED ORDER — MEGESTROL ACETATE 400 MG/10ML PO SUSP: 400.0000 mg | Freq: Two times a day (BID) | ORAL | Status: DC

## 2012-09-16 MED ORDER — PANCRELIPASE (LIP-PROT-AMYL) 12000-38000 UNITS PO CPEP: 2.0000 | ORAL_CAPSULE | Freq: Three times a day (TID) | ORAL | Status: AC

## 2012-09-16 NOTE — Addendum Note (Signed)
Encounter addended by: Billie Lade, MD on: 09/16/2012  5:56 PM<BR>     Documentation filed: Visit Diagnoses

## 2012-09-16 NOTE — Assessment & Plan Note (Signed)
Surgically there is no evidence of complications. He started chemoradiation. I will refill his Megace for appetite stimulation. He also needs to get his Creon, and I have given him a couple of weeks of samples.

## 2012-09-16 NOTE — Patient Instructions (Addendum)
Take 1 pill with each meal.  Will refill Megace.  Follow up in 3 months.

## 2012-09-16 NOTE — Progress Notes (Signed)
Subjective:     Patient ID: Colin Castro, male   DOB: 11/02/1945, 66 y.o.   MRN: [id number]  HPI Patient doing better since his discharge from the hospital 2-3 weeks ago. He had fairly bad enteritis from his chemotherapy. His weight is stable. He's been taking his Creon. He is not having anymore abdominal pain. He denies nausea and vomiting. He has been mostly off of his pain medication but still takes an occasional Percocet. He started radiation yesterday.  Review of Systems  All other systems reviewed and are negative.       Objective:   Physical Exam  Constitutional: He is oriented to person, place, and time. He appears well-developed and well-nourished. No distress.       Weight stable  HENT:  Head: Normocephalic and atraumatic.  Eyes: Pupils are equal, round, and reactive to light. Right eye exhibits no discharge. Left eye exhibits no discharge. No scleral icterus.  Neck: Normal range of motion.  Cardiovascular: Normal rate.   Pulmonary/Chest: Effort normal. No respiratory distress.  Abdominal: Soft. He exhibits no distension. There is no tenderness. There is no rebound and no guarding.  Musculoskeletal: Normal range of motion.  Neurological: He is alert and oriented to person, place, and time.  Skin: Skin is warm and dry. He is not diaphoretic.  Psychiatric: He has a normal mood and affect. His behavior is normal. Judgment and thought content normal.       Assessment/Plan:     Ampullary carcinoma Surgically there is no evidence of complications. He started chemoradiation. I will refill his Megace for appetite stimulation. He also needs to get his Creon, and I have given him a couple of weeks of samples.

## 2012-09-16 NOTE — Addendum Note (Signed)
Encounter addended by: Agnes Lawrence, RN on: 09/16/2012  1:40 PM<BR>     Documentation filed: Charges VN, Inpatient Patient Education, Inpatient Document Flowsheet

## 2012-09-22 ENCOUNTER — Ambulatory Visit
Admission: RE | Admit: 2012-09-22 | Discharge: 2012-09-22 | Disposition: A | Discharge status: Home / Self Care | Payer: Medicare Other | Source: Ambulatory Visit | Attending: Radiation Oncology | Admitting: Radiation Oncology

## 2012-09-22 DIAGNOSIS — C241 Malignant neoplasm of ampulla of Vater: Secondary | ICD-10-CM

## 2012-09-22 NOTE — Progress Notes (Signed)
  Radiation Oncology         [phone] ________________________________  Name: Arno Cullers MRN: [id number]  Date: 09/22/2012  DOB: 1945/09/06  Simulation Verification Note  Status: outpatient  NARRATIVE: The patient was brought to the treatment unit and placed in the planned treatment position. The clinical setup was verified. Then port films were obtained and uploaded to the radiation oncology medical record software.  The treatment beams were carefully compared against the planned radiation fields. The position location and shape of the radiation fields was reviewed. They targeted volume of tissue appears to be appropriately covered by the radiation beams. Organs at risk appear to be excluded as planned.  Based on my personal review, I approved the simulation verification. The patient's treatment will proceed as planned.  -----------------------------------  Billie Lade, PhD, MD

## 2012-09-23 ENCOUNTER — Ambulatory Visit: Admission: RE | Admit: 2012-09-23 | Payer: Medicare Other | Source: Ambulatory Visit

## 2012-09-24 ENCOUNTER — Ambulatory Visit
Admission: RE | Admit: 2012-09-24 | Discharge: 2012-09-24 | Disposition: A | Discharge status: Home / Self Care | Payer: Medicare Other | Source: Ambulatory Visit | Attending: Radiation Oncology | Admitting: Radiation Oncology

## 2012-09-24 ENCOUNTER — Encounter: Payer: Self-pay | Admitting: Radiation Oncology

## 2012-09-24 VITALS — BP 95/64 | HR 61 | Temp 97.0°F | Resp 16 | Wt 128.6 lb

## 2012-09-24 DIAGNOSIS — C241 Malignant neoplasm of ampulla of Vater: Secondary | ICD-10-CM

## 2012-09-24 NOTE — Progress Notes (Signed)
Patient presents to the clinic today accompanied by wife and interpretor. Patient is alert and oriented to person, place, and time. No distress noted. Steady gait noted. Pleasant affect noted. Patient denies pain at this time. Patient denies nausea or vomiting. Patient denies headache, dizziness, diarrhea or constipation. Patient reports that his appetite has greatly improved with megace. Patient has gained two pounds since 09/16/2012. Patient denies taking megace. Reported all findings to Dr. Roselind Messier.

## 2012-09-24 NOTE — Progress Notes (Signed)
[facility]    Radiation Oncology [address] [place]     Maryln Gottron, M.D. [place], [place] [address]               Billie Lade, M.D., Ph.D. Phone: [phone]      Molli Hazard A. Kathrynn Running, M.D. Fax: [phone]      Radene Gunning, M.D., Ph.D.         Lurline Hare, M.D.         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  Current Dose: 1.8 Gy  Current Fraction: 1  Planned Dose: 45 Gy  Narrative: Colin Castro was seen today for weekly treatment management. The chart was checked and CBCT  were reviewed. He is tolerating his radiation therapy well thus far.  His appetite is much better with Megace.  Review of patient's allergies indicates no known allergies.  Current Outpatient Prescriptions  Medication Sig Dispense Refill  . acetaminophen (TYLENOL) 500 MG tablet Take 500 mg by mouth every 6 (six) hours as needed. For pain      . hydrOXYzine (ATARAX/VISTARIL) 10 MG tablet Take 10 mg by mouth 2 (two) times daily as needed. For itching      . lipase/protease/amylase (CREON) 12000 UNITS CPEP Take 2 capsules by mouth 3 (three) times daily before meals.  180 capsule  12  . megestrol (MEGACE) 400 MG/10ML suspension Take 10 mLs (400 mg total) by mouth 2 (two) times daily.  240 mL  3  . Multiple Vitamin (MULTIVITAMIN WITH MINERALS) TABS Take 1 tablet by mouth daily.      . ondansetron (ZOFRAN) 4 MG tablet Take 4 mg by mouth every 6 (six) hours as needed. For nausea      . oxyCODONE-acetaminophen (PERCOCET/ROXICET) 5-325 MG per tablet Take 1-2 tablets by mouth every 4 (four) hours as needed. For pain  30 tablet  0  . polyethylene glycol powder (MIRALAX) powder Take 17 g by mouth daily.  255 g  4  . capecitabine (XELODA) 150 MG tablet Take  by mouth 2 (two) times daily after a meal.       Labs:  Lab Results  Component Value Date   WBC 12.7* 09/04/2012   HGB 12.6* 09/04/2012   HCT 36.8* 09/04/2012   MCV 81.3 09/04/2012   PLT 449* 09/04/2012   Lab Results  Component Value Date   CREATININE 0.9 09/04/2012   BUN 14.0 09/04/2012   NA 140 09/04/2012   K 3.5 09/04/2012   CL 110* 09/04/2012   CO2 20* 09/04/2012   Lab Results  Component Value Date   ALT 26 09/04/2012   AST 20 09/04/2012   PHOS 2.8 08/29/2012   BILITOT 0.37 09/04/2012    Physical Examination:  weight is 128 lb 9.6 oz (58.333 kg). His oral temperature is 97 F (36.1 C). His blood pressure is 95/64 and his pulse is 61. His respiration is 16.    Wt Readings from Last 3 Encounters:  09/24/12 128 lb 9.6 oz (58.333 kg)  09/16/12 128 lb 12.8 oz (58.423 kg)  09/15/12 127 lb 1.6 oz (57.652 kg)    The oral cavity is moist without secondary  infection. Lungs - Normal respiratory effort, chest expands symmetrically. Lungs are clear to auscultation, no crackles or wheezes.  Heart has regular rhythm and rate  Abdomen is soft and non tender with normal bowel sounds  Assessment:  Patient tolerating treatments well  Plan: Continue treatment per original radiation prescription.

## 2012-09-25 ENCOUNTER — Ambulatory Visit
Admission: RE | Admit: 2012-09-25 | Discharge: 2012-09-25 | Disposition: A | Discharge status: Home / Self Care | Payer: Medicare Other | Source: Ambulatory Visit | Attending: Radiation Oncology | Admitting: Radiation Oncology

## 2012-09-26 ENCOUNTER — Ambulatory Visit
Admission: RE | Admit: 2012-09-26 | Discharge: 2012-09-26 | Disposition: A | Discharge status: Home / Self Care | Payer: Medicare Other | Source: Ambulatory Visit | Attending: Radiation Oncology | Admitting: Radiation Oncology

## 2012-09-26 ENCOUNTER — Other Ambulatory Visit: Payer: Self-pay | Admitting: Oncology

## 2012-09-26 ENCOUNTER — Encounter: Payer: Self-pay | Admitting: Medical Oncology

## 2012-09-26 DIAGNOSIS — C241 Malignant neoplasm of ampulla of Vater: Secondary | ICD-10-CM

## 2012-09-26 MED ORDER — RADIAPLEXRX EX GEL
Freq: Once | CUTANEOUS | Status: AC
  Administered 2012-09-26: 16:00:00 via TOPICAL

## 2012-09-26 NOTE — Progress Notes (Signed)
MD prescription for Xeloda 500 mg # 60 given to patient through radiation nurse, patient to take twice a day Monday-Friday with Radiation.

## 2012-09-29 ENCOUNTER — Encounter: Payer: Self-pay | Admitting: Oncology

## 2012-09-29 ENCOUNTER — Other Ambulatory Visit: Payer: Self-pay | Admitting: Oncology

## 2012-09-29 ENCOUNTER — Other Ambulatory Visit: Payer: Self-pay | Admitting: Emergency Medicine

## 2012-09-29 ENCOUNTER — Ambulatory Visit
Admission: RE | Admit: 2012-09-29 | Discharge: 2012-09-29 | Disposition: A | Discharge status: Home / Self Care | Payer: Medicare Other | Source: Ambulatory Visit | Attending: Radiation Oncology | Admitting: Radiation Oncology

## 2012-09-29 DIAGNOSIS — C241 Malignant neoplasm of ampulla of Vater: Secondary | ICD-10-CM

## 2012-09-29 MED ORDER — CAPECITABINE 500 MG PO TABS: 500.0000 mg | ORAL_TABLET | Freq: Two times a day (BID) | ORAL | Status: DC

## 2012-09-29 NOTE — Progress Notes (Signed)
Faxed xeloda prescription to [facility] °

## 2012-09-30 ENCOUNTER — Encounter: Payer: Self-pay | Admitting: Radiation Oncology

## 2012-09-30 ENCOUNTER — Ambulatory Visit
Admission: RE | Admit: 2012-09-30 | Discharge: 2012-09-30 | Disposition: A | Discharge status: Home / Self Care | Payer: Medicare Other | Source: Ambulatory Visit | Attending: Radiation Oncology | Admitting: Radiation Oncology

## 2012-09-30 VITALS — BP 99/65 | HR 74 | Resp 18 | Wt 133.3 lb

## 2012-09-30 DIAGNOSIS — C241 Malignant neoplasm of ampulla of Vater: Secondary | ICD-10-CM

## 2012-09-30 NOTE — Progress Notes (Signed)
Patient presents to the clinic today accompanied by his family for a PUT with Dr. Roselind Messier. Patient alert and oriented to person, place, and time. No distress noted. Steady gait noted. Pleasant affect noted. Patient denies pain at this time. Patient has gained five pounds since 09/24/2012. Patient reports that he feels well today and his energy level has improved. Patient has not received xeloda in the mail yet. Patient understands that Dr. Welton Flakes is working to get financial assistance to assist with coverage of this medication. Patient denies nausea, vomiting, headache or dizziness. Reported all findings to Dr. Roselind Messier.

## 2012-09-30 NOTE — Progress Notes (Signed)
[facility]    Radiation Oncology [address] [place]     Maryln Gottron, M.D. [place], [place] [address]               Billie Lade, M.D., Ph.D. Phone: [phone]      Molli Hazard A. Kathrynn Running, M.D. Fax: [phone]     Radene Gunning, M.D., Ph.D.         Lurline Hare, M.D.         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  Current Dose: 9 Gy  Current Fraction: 5  Planned Dose: 45 Gy  Narrative: Colin Castro was seen today for weekly treatment management. The chart was checked and CBCT  were reviewed. He is tolerating his treatments well this time. He denies any nausea loose bowels or diarrhea. Patient's appetite is improving with his Megace.  Review of patient's allergies indicates no known allergies.  Current Outpatient Prescriptions  Medication Sig Dispense Refill  . acetaminophen (TYLENOL) 500 MG tablet Take 500 mg by mouth every 6 (six) hours as needed. For pain      . capecitabine (XELODA) 500 MG tablet Take 1 tablet (500 mg total) by mouth 2 (two) times daily after a meal. Take Monday - Friday with radiation  60 tablet  2  . hydrOXYzine (ATARAX/VISTARIL) 10 MG tablet Take 10 mg by mouth 2 (two) times daily as needed. For itching      . lipase/protease/amylase (CREON) 12000 UNITS CPEP Take 2 capsules by mouth 3 (three) times daily before meals.  180 capsule  12  . megestrol (MEGACE) 400 MG/10ML suspension Take 10 mLs (400 mg total) by mouth 2 (two) times daily.  240 mL  3  . Multiple Vitamin (MULTIVITAMIN WITH MINERALS) TABS Take 1 tablet by mouth daily.      . polyethylene glycol powder (MIRALAX) powder Take 17 g by mouth daily.  255 g  4  . capecitabine (XELODA) 150 MG tablet Take by mouth 2 (two) times daily after a meal.       . ondansetron (ZOFRAN) 4 MG tablet Take 4 mg by mouth every 6 (six) hours as needed. For nausea      . oxyCODONE-acetaminophen (PERCOCET/ROXICET) 5-325 MG per tablet Take 1-2 tablets by mouth every 4 (four) hours as needed. For pain  30 tablet  0   No current facility-administered medications for this encounter.  ysical Examination:  weight is 133 lb 4.8 oz (60.464 kg). His blood pressure is 99/65 and his pulse is 74. His respiration is 18.    Wt Readings from Last 3 Encounters:  09/30/12 133 lb 4.8 oz (60.464 kg)  09/24/12 128 lb 9.6 oz (58.333 kg)  09/16/12 128 lb 12.8 oz (58.423 kg)    The oral cavity is moist without secondary infection. Lungs - Normal respiratory effort, chest expands symmetrically. Lungs are clear to auscultation, no crackles or wheezes.  Heart has regular rhythm and rate  Abdomen is soft and non tender with normal bowel sounds  Assessment:  Patient tolerating treatments well  Plan: Continue treatment per original radiation prescription

## 2012-10-01 ENCOUNTER — Telehealth: Payer: Self-pay | Admitting: *Deleted

## 2012-10-01 ENCOUNTER — Ambulatory Visit: Payer: Medicare Other

## 2012-10-01 NOTE — Telephone Encounter (Signed)
Rec'd confirmation fax that patients Xeloda was shipped on 10/01/12 for next day delivery.

## 2012-10-02 ENCOUNTER — Ambulatory Visit: Payer: Medicare Other

## 2012-10-03 ENCOUNTER — Ambulatory Visit
Admission: RE | Admit: 2012-10-03 | Discharge: 2012-10-03 | Disposition: A | Discharge status: Home / Self Care | Payer: Medicare Other | Source: Ambulatory Visit | Attending: Radiation Oncology | Admitting: Radiation Oncology

## 2012-10-03 NOTE — Progress Notes (Signed)
Late entry from 09/26/2012 at 1515. Oriented patient and his wife to staff and routine of the clinic. Provided patient with RADIATION THERAPY AND YOU handbook then, reviewed pertinent information. Educated patient on potential side effects and management such as, fatigue, nausea, vomiting. All questions answered. Provided patient with this writer's contact information and encouraged to call with needs. Both verbalized understanding of all reviewed.

## 2012-10-05 ENCOUNTER — Other Ambulatory Visit: Payer: Medicare Other | Admitting: Lab

## 2012-10-06 ENCOUNTER — Telehealth: Payer: Self-pay | Admitting: Oncology

## 2012-10-06 ENCOUNTER — Other Ambulatory Visit (HOSPITAL_BASED_OUTPATIENT_CLINIC_OR_DEPARTMENT_OTHER): Payer: Medicare Other | Admitting: Lab

## 2012-10-06 ENCOUNTER — Ambulatory Visit
Admission: RE | Admit: 2012-10-06 | Discharge: 2012-10-06 | Disposition: A | Discharge status: Home / Self Care | Payer: Medicare Other | Source: Ambulatory Visit | Attending: Radiation Oncology | Admitting: Radiation Oncology

## 2012-10-06 ENCOUNTER — Other Ambulatory Visit: Payer: Medicare Other | Admitting: Lab

## 2012-10-06 ENCOUNTER — Ambulatory Visit (HOSPITAL_BASED_OUTPATIENT_CLINIC_OR_DEPARTMENT_OTHER): Payer: Medicare Other | Admitting: Oncology

## 2012-10-06 VITALS — BP 103/66 | HR 57 | Temp 97.0°F | Resp 20 | Ht 66.0 in | Wt 132.1 lb

## 2012-10-06 DIAGNOSIS — C241 Malignant neoplasm of ampulla of Vater: Secondary | ICD-10-CM

## 2012-10-06 DIAGNOSIS — C7889 Secondary malignant neoplasm of other digestive organs: Secondary | ICD-10-CM

## 2012-10-06 DIAGNOSIS — R5381 Other malaise: Secondary | ICD-10-CM

## 2012-10-06 DIAGNOSIS — R634 Abnormal weight loss: Secondary | ICD-10-CM

## 2012-10-06 LAB — COMPREHENSIVE METABOLIC PANEL (CC13)
ALT: 31 U/L (ref 0–55)
Albumin: 3.5 g/dL (ref 3.5–5.0)
CO2: 23 mEq/L (ref 22–29)
Calcium: 10 mg/dL (ref 8.4–10.4)
Chloride: 109 mEq/L — ABNORMAL HIGH (ref 98–107)
Glucose: 87 mg/dl (ref 70–99)
Potassium: 4 mEq/L (ref 3.5–5.1)
Sodium: 141 mEq/L (ref 136–145)
Total Bilirubin: 0.48 mg/dL (ref 0.20–1.20)
Total Protein: 7.8 g/dL (ref 6.4–8.3)

## 2012-10-06 LAB — CBC WITH DIFFERENTIAL/PLATELET
Eosinophils Absolute: 1 10*3/uL — ABNORMAL HIGH (ref 0.0–0.5)
LYMPH%: 22 % (ref 14.0–49.0)
MONO#: 0.9 10*3/uL (ref 0.1–0.9)
NEUT#: 4.1 10*3/uL (ref 1.5–6.5)
Platelets: 115 10*3/uL — ABNORMAL LOW (ref 140–400)
RBC: 4.87 10*6/uL (ref 4.20–5.82)
WBC: 7.7 10*3/uL (ref 4.0–10.3)
lymph#: 1.7 10*3/uL (ref 0.9–3.3)

## 2012-10-06 NOTE — Patient Instructions (Addendum)
Proceed with xeloda 500 mg twice a day  Take imodium for diarrhea  I will see you back on 2/24 at 12:30

## 2012-10-06 NOTE — Telephone Encounter (Signed)
gv pt appt schedule for feb °

## 2012-10-06 NOTE — Progress Notes (Signed)
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 He underwent pancreatic code duodenectomy. His final pathology revealed a well to moderately differentiated adenocarcinoma the tumor arose from high grade dysplasia involving small bowel mucosa in the ampulla invaded into the pancreas margins. The margins were negative LV I was noted one lymph node was positive for metastatic disease additional 14 lymph nodes were negative. Portal lymph node resection revealed 5 benign lymph nodes. Pathologically T3 N1.  #2 patient is now beginning adjuvant chemotherapy consisting of Gemzar x3 weeks.  #3 he was seen by Dr. Antony Blackbird for adjuvant radiation therapy with radiosensitizing Xeloda.  CURRENT THERAPY: Patient will proceed with week #3 cycle 1 of Gemzar.  INTERVAL HISTORY: Colin Castro 67 y.o. male returns for followup visit he is accompanied by his wife.  Patient looks very weak tired and fatigued. He is however not having any abdominal pain. As you recall he was recently admitted to [facility] long for abdominal pain and small bowel extraction. He is beginning to eat again. However his main primary concern is weakness. He has had significant weight loss. He denies any fevers chills night sweats headaches and has no shortness of breath no chest pains or palpitations no myalgias and arthralgias remainder of the 10 point review of systems is negative MEDICAL HISTORY:  Past Medical History  Diagnosis Date  . Hypertension   . High cholesterol   . Coronary artery disease   . Common bile duct (CBD) stricture     Malignant  . 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 Pneumonia   . Constipation   . Myocardial infarction     15 years ago    ALLERGIES:  has No Known Allergies.  MEDICATIONS:  Current Outpatient Prescriptions  Medication Sig Dispense Refill  . acetaminophen (TYLENOL) 500 MG tablet Take 500 mg by mouth every 6 (six) hours as needed. For pain      . capecitabine (XELODA) 500 MG tablet Take 1 tablet (500 mg total) by mouth 2 (two) times daily after a meal. Take Monday - Friday with radiation  60 tablet  2  . lipase/protease/amylase (CREON) 12000 UNITS CPEP Take 2 capsules by mouth 3 (three) times daily before meals.  180 capsule  12  . Multiple Vitamin (MULTIVITAMIN WITH MINERALS) TABS Take 1 tablet by mouth daily.      . ondansetron (ZOFRAN) 4 MG tablet Take 4 mg by mouth every 6 (six) hours as needed. For nausea      . oxyCODONE-acetaminophen (PERCOCET/ROXICET) 5-325 MG per tablet Take 1-2 tablets by mouth every 4 (four) hours as needed. For pain  30 tablet  0  . capecitabine (XELODA) 150 MG tablet Take by mouth 2 (two) times daily after a meal.      . hydrOXYzine (ATARAX/VISTARIL) 10 MG tablet Take 10 mg by mouth 2 (two) times daily as needed. For itching      . megestrol (MEGACE) 400 MG/10ML suspension Take 10 mLs (400 mg total) by mouth 2 (two) times daily.  240 mL  3  . polyethylene glycol powder (MIRALAX) powder Take 17 g by mouth daily.  255 g  4   No current  facility-administered medications for this visit.    SURGICAL HISTORY:  Past Surgical History  Procedure Laterality Date  . Ercp  05/07/2012    Procedure: ENDOSCOPIC RETROGRADE CHOLANGIOPANCREATOGRAPHY (ERCP);  Surgeon: [name], MD;  Location: [facility];  Service: Endoscopy;  Laterality: N/A;  Dr [name]  . Eus  05/07/2012    Procedure: UPPER ENDOSCOPIC ULTRASOUND (EUS) RADIAL;  Surgeon: [name], MD;  Location: [facility];  Service: Endoscopy;;  . Appendectomy      open, 25 yrs ago  . Coronary stent placement       2 stents placed  . Whipple procedure  06/06/2012    Procedure: WHIPPLE PROCEDURE;  Surgeon: [name], MD;  Location: [facility] OR;  Service: General;  Laterality: N/A;  Pancreatico duodenectomy  . Cholecystectomy  06/06/2012    Procedure: CHOLECYSTECTOMY;  Surgeon: [name], MD;  Location: [facility] OR;  Service: General;  Laterality: N/A;  . Pancreatic stent placement  06/06/2012    Procedure: PANCREATIC STENT PLACEMENT;  Surgeon: [name], MD;  Location: [facility] OR;  Service: General;  Laterality: N/A;  Removal of biliary stent; Placement of pacreatic stent  . Lymph node dissection  06/06/2012    Procedure: LYMPH NODE DISSECTION;  Surgeon: [name], MD;  Location: [facility] OR;  Service: General;  Laterality: N/A;  Portal lymph node dissection  . Coronary angioplasty      8 or 9 years ago  . Portacath placement  07/24/2012    Procedure: INSERTION PORT-A-CATH;  Surgeon: [name], MD;  Location: [facility] OR;  Service: General;  Laterality: Left;    REVIEW OF SYSTEMS:   General: fatigue (+), night sweats (-), fever (-), pain (-) Lymph: palpable nodes (-) HEENT: vision changes (-), mucositis (-), gum bleeding (-), epistaxis (-) Cardiovascular: chest pain (-), palpitations (-) Pulmonary: shortness of breath (-), dyspnea on exertion (-), cough (-), hemoptysis (-) GI:  Early satiety (-), melena (-), dysphagia (-), nausea/vomiting (-), diarrhea (-) GU: dysuria (-), hematuria (-), incontinence (-) Musculoskeletal: joint swelling (-), joint pain (-), back pain (-) Neuro: weakness (-), numbness (-), headache (-), confusion (-) Skin: Rash (-), lesions (-), dryness (-) Psych: depression (-), suicidal/homicidal ideation (-), feeling of hopelessness (-)   PHYSICAL EXAMINATION: Blood pressure 103/66, pulse 57, temperature 97 F (36.1 C), temperature source Oral, resp. rate 20, height 5\' 6"  (1.676 m), weight 132 lb 1.6 oz (59.92 kg). Body mass index is 21.33 kg/(m^2). General: Patient is a well appearing male in  no acute distress HEENT: PERRLA, sclerae anicteric no conjunctival pallor, MMM Neck: supple, no palpable adenopathy Lungs: clear to auscultation bilaterally, no wheezes, rhonchi, or rales Cardiovascular: tachycardic, regular rhythm, S1, S2, no murmurs, rubs or gallops Abdomen: Soft, non-distended, normoactive bowel sounds, no HSM, extremely tender to palpation, particularly the RLQ, with rebound.  Patient guarding abdomen when moving on exam table and grimacing while moving.  Extremities: warm and well perfused, no clubbing, cyanosis, or edema Skin: No rashes or lesions Neuro: Non-focal ECOG PERFORMANCE STATUS: 1 - Symptomatic but completely ambulatory   LABORATORY DATA: Lab Results  Component Value Date   WBC 7.7 10/06/2012   HGB 14.1 10/06/2012   HCT 41.4 10/06/2012   MCV 85.0 10/06/2012   PLT 115* 10/06/2012      Chemistry      Component Value Date/Time   NA 141 10/06/2012 0853   NA 134* 08/30/2012 0404   K 4.0 10/06/2012 0853   K 4.1 08/30/2012 0404   CL 109* 10/06/2012 1610  CL 106 08/30/2012 0404   CO2 23 10/06/2012 0853   CO2 17* 08/30/2012 0404   BUN 14.4 10/06/2012 0853   BUN 6 08/30/2012 0404   CREATININE 0.8 10/06/2012 0853   CREATININE 0.75 08/30/2012 0404      Component Value Date/Time   CALCIUM 10.0 10/06/2012 0853   CALCIUM 8.4 08/30/2012 0404   ALKPHOS 76 10/06/2012 0853   ALKPHOS 99 08/28/2012 0430   AST 16 10/06/2012 0853   AST 17 08/28/2012 0430   ALT 31 10/06/2012 0853   ALT 29 08/28/2012 0430   BILITOT 0.48 10/06/2012 0853   BILITOT 0.3 08/28/2012 0430       RADIOGRAPHIC STUDIES:    ASSESSMENT: 67 year old gentleman with  #1 periampullary carcinoma stage III patient is status post Whipple procedure overall she's done well with it. You will not receive adjuvant chemotherapy initially consisting of Gemzar for one cycle. He will then go on to receive concomitant chemoradiation with chemotherapy consisting of xeloda. Risks and benefits of treatment have been discussed  with them completely.  #2 patient has now completed the initial chemotherapy of one cycle of Gemzar. Unfortunately his course was complicated by development of abdominal pain weakness and fatigue and therefore he was admitted to the hospital with a small bowel obstruction this is now resolved.  #3 patient is now ready to get started on radiation therapy I have recommended this to the patient as well as his wife and through the interpreter. Certainly he will need Xeloda concurrently with the radiation. However due to patient's comorbidity I do think that we will have to reduce the dose is a low.during radiation therapy  #4 patient has begun radiation therapy by Dr. Jacob Moores.we have had great deal of difficulty obtaining the Xeloda. But he is finally obtained it. At this time my recommendation is to begin it at 500 mg twice a day to see how he tolerates it. If he tolerates it well we certainly can slowly go up on the dosing. Risks and side effects and benefits were discussed with both patient and his wife through the interpreter.  PLAN:   #1begins Xeloda.500 mg twice a day.  #2 I will see him back in one week's time for followup  All questions were answered. The patient knows to call the clinic with any problems, questions or concerns. We can certainly see the patient much sooner if necessary.  I spent 15 minutes counseling the patient face to face. The total time spent in the appointment was 30 minutes.  Drue Second, MD Medical/Oncology [facility] [phone] (beeper) [phone] (Office)  09/08/2012, 11:09 AM

## 2012-10-07 ENCOUNTER — Encounter: Payer: Self-pay | Admitting: Radiation Oncology

## 2012-10-07 ENCOUNTER — Ambulatory Visit
Admission: RE | Admit: 2012-10-07 | Discharge: 2012-10-07 | Disposition: A | Discharge status: Home / Self Care | Payer: Medicare Other | Source: Ambulatory Visit | Attending: Radiation Oncology | Admitting: Radiation Oncology

## 2012-10-07 VITALS — BP 96/69 | HR 70 | Resp 18 | Wt 133.2 lb

## 2012-10-07 DIAGNOSIS — C241 Malignant neoplasm of ampulla of Vater: Secondary | ICD-10-CM

## 2012-10-07 NOTE — Progress Notes (Signed)
[facility]    Radiation Oncology [address] [place]     Maryln Gottron, M.D. [place], [place] [address]               Billie Lade, M.D., Ph.D. Phone: [phone]      Molli Hazard A. Kathrynn Running, M.D. Fax: [phone]      Radene Gunning, M.D., Ph.D.         Lurline Hare, M.D.         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  Current Dose: 14.4 Gy  Current Fraction: 8  Planned Dose: 45 Gy  Narrative: Colin Castro was seen today for weekly treatment management. The chart was checked and CBCT  were reviewed. He is tolerating his treatments well at this time without any nausea or diarrhea his appetite is much improved since being on Megace. He has minimal fatigue. The patient has started Xeloda and seems to be tolerating this well.  Review of patient's allergies indicates no known allergies.  Current Outpatient Prescriptions  Medication Sig Dispense Refill  . acetaminophen (TYLENOL) 500 MG tablet Take 500 mg by mouth every 6 (six) hours as needed. For pain      . capecitabine (XELODA) 150 MG tablet Take by mouth 2 (two) times daily after a meal.      . capecitabine (XELODA) 500 MG tablet Take 1 tablet (500 mg total) by mouth 2 (two) times daily after a meal. Take Monday - Friday with radiation  60 tablet  2  . hydrOXYzine (ATARAX/VISTARIL) 10 MG tablet Take 10 mg by mouth 2 (two) times daily as needed. For itching      . lipase/protease/amylase (CREON) 12000 UNITS CPEP Take 2 capsules by mouth 3 (three) times daily before meals.  180 capsule  12  . megestrol (MEGACE) 400 MG/10ML suspension Take 10 mLs (400 mg total) by mouth 2 (two) times daily.  240 mL  3  . Multiple Vitamin (MULTIVITAMIN WITH MINERALS) TABS Take 1 tablet by mouth  daily.      . ondansetron (ZOFRAN) 4 MG tablet Take 4 mg by mouth every 6 (six) hours as needed. For nausea      . oxyCODONE-acetaminophen (PERCOCET/ROXICET) 5-325 MG per tablet Take 1-2 tablets by mouth every 4 (four) hours as needed. For pain  30 tablet  0  . polyethylene glycol powder (MIRALAX) powder Take 17 g by mouth daily.  255 g  4   No current facility-administered medications for this encounter.   Labs:  Lab Results  Component Value Date   WBC 7.7 10/06/2012   HGB 14.1 10/06/2012   HCT 41.4 10/06/2012   MCV 85.0 10/06/2012   PLT 115* 10/06/2012   Lab Results  Component Value Date   CREATININE 0.8 10/06/2012   BUN 14.4 10/06/2012   NA 141 10/06/2012   K 4.0 10/06/2012   CL 109* 10/06/2012   CO2 23 10/06/2012   Lab Results  Component Value Date   ALT 31 10/06/2012   AST 16 10/06/2012   PHOS 2.8 08/29/2012   BILITOT 0.48 10/06/2012    Physical Examination:  weight is 133 lb 3.2 oz (60.419 kg). His  blood pressure is 96/69 and his pulse is 70. His respiration is 18.    Wt Readings from Last 3 Encounters:  10/07/12 133 lb 3.2 oz (60.419 kg)  10/06/12 132 lb 1.6 oz (59.92 kg)  09/30/12 133 lb 4.8 oz (60.464 kg)     Lungs - Normal respiratory effort, chest expands symmetrically. Lungs are clear to auscultation, no crackles or wheezes.  Heart has regular rhythm and rate  Abdomen is soft and non tender with normal bowel sounds  Assessment:  Patient tolerating treatments well  Plan: Continue treatment per original radiation prescription

## 2012-10-07 NOTE — Progress Notes (Signed)
Patient presents to the clinic today accompanied by his wife and translator. Patient alert and oriented to person, place, and time. No distress noted. Steady gait noted. Pleasant affect noted. Patient denies pain at this time. Patient reports that he began taking xeloda yesterday. Patient reports fatigue and weakness. Patient reports a gigantic appetite. Patient has maintained weight since last week. Patient denies nausea, vomiting, headache, dizziness or diarrhea. Reported all findings to Dr. Roselind Messier.

## 2012-10-08 ENCOUNTER — Ambulatory Visit
Admission: RE | Admit: 2012-10-08 | Discharge: 2012-10-08 | Disposition: A | Discharge status: Home / Self Care | Payer: Medicare Other | Source: Ambulatory Visit | Attending: Radiation Oncology | Admitting: Radiation Oncology

## 2012-10-09 ENCOUNTER — Ambulatory Visit
Admission: RE | Admit: 2012-10-09 | Discharge: 2012-10-09 | Disposition: A | Discharge status: Home / Self Care | Payer: Medicare Other | Source: Ambulatory Visit | Attending: Radiation Oncology | Admitting: Radiation Oncology

## 2012-10-10 ENCOUNTER — Ambulatory Visit
Admission: RE | Admit: 2012-10-10 | Discharge: 2012-10-10 | Disposition: A | Discharge status: Home / Self Care | Payer: Medicare Other | Source: Ambulatory Visit | Attending: Radiation Oncology | Admitting: Radiation Oncology

## 2012-10-13 ENCOUNTER — Encounter: Payer: Self-pay | Admitting: Oncology

## 2012-10-13 ENCOUNTER — Other Ambulatory Visit (HOSPITAL_BASED_OUTPATIENT_CLINIC_OR_DEPARTMENT_OTHER): Payer: Medicare Other

## 2012-10-13 ENCOUNTER — Telehealth: Payer: Self-pay | Admitting: Oncology

## 2012-10-13 ENCOUNTER — Ambulatory Visit (HOSPITAL_BASED_OUTPATIENT_CLINIC_OR_DEPARTMENT_OTHER): Payer: Medicare Other | Admitting: Oncology

## 2012-10-13 ENCOUNTER — Ambulatory Visit
Admission: RE | Admit: 2012-10-13 | Discharge: 2012-10-13 | Disposition: A | Discharge status: Home / Self Care | Payer: Medicare Other | Source: Ambulatory Visit | Attending: Radiation Oncology | Admitting: Radiation Oncology

## 2012-10-13 VITALS — BP 96/62 | HR 69 | Temp 97.5°F | Resp 18 | Ht 66.0 in | Wt 133.3 lb

## 2012-10-13 DIAGNOSIS — C241 Malignant neoplasm of ampulla of Vater: Secondary | ICD-10-CM

## 2012-10-13 DIAGNOSIS — D696 Thrombocytopenia, unspecified: Secondary | ICD-10-CM

## 2012-10-13 LAB — COMPREHENSIVE METABOLIC PANEL (CC13)
ALT: 25 U/L (ref 0–55)
Albumin: 3.1 g/dL — ABNORMAL LOW (ref 3.5–5.0)
CO2: 21 mEq/L — ABNORMAL LOW (ref 22–29)
Glucose: 100 mg/dl — ABNORMAL HIGH (ref 70–99)
Potassium: 3.6 mEq/L (ref 3.5–5.1)
Sodium: 142 mEq/L (ref 136–145)
Total Bilirubin: 0.38 mg/dL (ref 0.20–1.20)
Total Protein: 7 g/dL (ref 6.4–8.3)

## 2012-10-13 LAB — CBC WITH DIFFERENTIAL/PLATELET
BASO%: 0.4 % (ref 0.0–2.0)
Eosinophils Absolute: 1.3 10*3/uL — ABNORMAL HIGH (ref 0.0–0.5)
MCHC: 34 g/dL (ref 32.0–36.0)
MONO#: 0.7 10*3/uL (ref 0.1–0.9)
NEUT#: 3.1 10*3/uL (ref 1.5–6.5)
RBC: 4.32 10*6/uL (ref 4.20–5.82)
RDW: 18.6 % — ABNORMAL HIGH (ref 11.0–14.6)
WBC: 5.9 10*3/uL (ref 4.0–10.3)
lymph#: 0.9 10*3/uL (ref 0.9–3.3)

## 2012-10-13 MED ORDER — ZOLPIDEM TARTRATE 5 MG PO TABS: 5.0000 mg | ORAL_TABLET | Freq: Every evening | ORAL | Status: DC | PRN

## 2012-10-13 NOTE — Patient Instructions (Addendum)
Increase xeloda 2 pills in the morning and 1 pill at night  I will see you back in 1 week

## 2012-10-13 NOTE — Progress Notes (Signed)
OFFICE PROGRESS NOTE  CC  [name], MD [address] 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 He underwent pancreatic code duodenectomy. His final pathology revealed a well to moderately differentiated adenocarcinoma the tumor arose from high grade dysplasia involving small bowel mucosa in the ampulla invaded into the pancreas margins. The margins were negative LV I was noted one lymph node was positive for metastatic disease additional 14 lymph nodes were negative. Portal lymph node resection revealed 5 benign lymph nodes. Pathologically T3 N1.  #2 patient is now beginning adjuvant chemotherapy consisting of Gemzar x3 weeks.  #3 he was seen by Dr. Antony Blackbird for adjuvant radiation therapy with radiosensitizing Xeloda.  #4 patient was begun on radiation therapy about 3 weeks ago. Thus far he is tolerating it well without any significant problems. He began Xeloda 500 mg twice a day beginning on 10/07/2012. We will not increase his dose to 1000 in the morning 500 in the evenings.  CURRENT THERAPY: increase dose xeloda to 1000 mg in the morning 500 mg in the evenings Monday through Friday.. Continue radiation as planned.  INTERVAL HISTORY: Theresa Dohrman 67 y.o. male returns for followup visit he is accompanied by his wife.  Overall he is doing well he is tolerating Xeloda without any significant problems. He denies any fevers chills night sweats headaches shortness of breath chest pains palpitations no myalgias and arthralgias no diarrhea no bleeding. Remainder of the 10 point review of systems is negative MEDICAL HISTORY:  Past Medical History  Diagnosis Date  . Hypertension   . High cholesterol   . Coronary artery disease   .  Common bile duct (CBD) stricture     Malignant  . 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 Pneumonia   . Constipation   . Myocardial infarction     15 years ago    ALLERGIES:  has No Known Allergies.  MEDICATIONS:  Current Outpatient Prescriptions  Medication Sig Dispense Refill  . acetaminophen (TYLENOL) 500 MG tablet Take 500 mg by mouth every 6 (six) hours as needed. For pain      . capecitabine (XELODA) 500 MG tablet Take 1 tablet (500 mg total) by mouth 2 (two) times daily after a meal. Take Monday - Friday with radiation  60 tablet  2  . lipase/protease/amylase (CREON) 12000 UNITS CPEP Take 2 capsules by mouth 3 (three) times daily before meals.  180 capsule  12  . megestrol (MEGACE) 400 MG/10ML suspension Take 10 mLs (400 mg total) by mouth 2 (two) times daily.  240 mL  3  . Multiple Vitamin (MULTIVITAMIN WITH MINERALS) TABS Take 1 tablet by mouth daily.      . capecitabine (XELODA) 150 MG tablet Take by mouth 2 (two) times daily after a meal.      . hydrOXYzine (ATARAX/VISTARIL) 10 MG tablet Take 10 mg by mouth 2 (two) times daily as needed. For itching      . ondansetron (ZOFRAN) 4 MG tablet Take 4 mg by mouth every 6 (six) hours as needed. For nausea      . oxyCODONE-acetaminophen (PERCOCET/ROXICET) 5-325 MG per tablet Take 1-2 tablets by mouth every 4 (four) hours as needed. For pain  30 tablet  0  .  polyethylene glycol powder (MIRALAX) powder Take 17 g by mouth daily.  255 g  4  . zolpidem (AMBIEN) 5 MG tablet Take 1 tablet (5 mg total) by mouth at bedtime as needed for sleep.  30 tablet  0   No current facility-administered medications for this visit.    SURGICAL HISTORY:  Past Surgical History  Procedure Laterality Date  . Ercp  05/07/2012    Procedure: ENDOSCOPIC RETROGRADE CHOLANGIOPANCREATOGRAPHY (ERCP);  Surgeon: [name], MD;  Location: [facility];  Service: Endoscopy;  Laterality: N/A;  Dr  [name]  . Eus  05/07/2012    Procedure: UPPER ENDOSCOPIC ULTRASOUND (EUS) RADIAL;  Surgeon: [name], MD;  Location: [facility];  Service: Endoscopy;;  . Appendectomy      open, 25 yrs ago  . Coronary stent placement      2 stents placed  . Whipple procedure  06/06/2012    Procedure: WHIPPLE PROCEDURE;  Surgeon: [name], MD;  Location: [facility] OR;  Service: General;  Laterality: N/A;  Pancreatico duodenectomy  . Cholecystectomy  06/06/2012    Procedure: CHOLECYSTECTOMY;  Surgeon: [name], MD;  Location: [facility] OR;  Service: General;  Laterality: N/A;  . Pancreatic stent placement  06/06/2012    Procedure: PANCREATIC STENT PLACEMENT;  Surgeon: [name], MD;  Location: [facility] OR;  Service: General;  Laterality: N/A;  Removal of biliary stent; Placement of pacreatic stent  . Lymph node dissection  06/06/2012    Procedure: LYMPH NODE DISSECTION;  Surgeon: [name], MD;  Location: [facility] OR;  Service: General;  Laterality: N/A;  Portal lymph node dissection  . Coronary angioplasty      8 or 9 years ago  . Portacath placement  07/24/2012    Procedure: INSERTION PORT-A-CATH;  Surgeon: [name], MD;  Location: [facility] OR;  Service: General;  Laterality: Left;    REVIEW OF SYSTEMS:   General: fatigue (+), night sweats (-), fever (-), pain (-) Lymph: palpable nodes (-) HEENT: vision changes (-), mucositis (-), gum bleeding (-), epistaxis (-) Cardiovascular: chest pain (-), palpitations (-) Pulmonary: shortness of breath (-), dyspnea on exertion (-), cough (-), hemoptysis (-) GI:  Early satiety (-), melena (-), dysphagia (-), nausea/vomiting (-), diarrhea (-) GU: dysuria (-), hematuria (-), incontinence (-) Musculoskeletal: joint swelling (-), joint pain (-), back pain (-) Neuro: weakness (-), numbness (-), headache (-), confusion (-) Skin: Rash (-), lesions (-), dryness (-) Psych: depression (-), suicidal/homicidal ideation (-), feeling of hopelessness (-)   PHYSICAL  EXAMINATION: Blood pressure 96/62, pulse 69, temperature 97.5 F (36.4 C), temperature source Oral, resp. rate 18, height 5\' 6"  (1.676 m), weight 133 lb 5 oz (60.47 kg). Body mass index is 21.53 kg/(m^2). General: Patient is a well appearing male in no acute distress HEENT: PERRLA, sclerae anicteric no conjunctival pallor, MMM Neck: supple, no palpable adenopathy Lungs: clear to auscultation bilaterally, no wheezes, rhonchi, or rales Cardiovascular: tachycardic, regular rhythm, S1, S2, no murmurs, rubs or gallops Abdomen: Soft, non-distended, normoactive bowel sounds, no HSM, extremely tender to palpation, particularly the RLQ, with rebound.  Patient guarding abdomen when moving on exam table and grimacing while moving.  Extremities: warm and well perfused, no clubbing, cyanosis, or edema Skin: No rashes or lesions Neuro: Non-focal ECOG PERFORMANCE STATUS: 1 - Symptomatic but completely ambulatory   LABORATORY DATA: Lab Results  Component Value Date   WBC 5.9 10/13/2012   HGB 12.6* 10/13/2012   HCT 36.9* 10/13/2012   MCV 85.5 10/13/2012   PLT 107*  10/13/2012      Chemistry      Component Value Date/Time   NA 142 10/13/2012 1201   NA 134* 08/30/2012 0404   K 3.6 10/13/2012 1201   K 4.1 08/30/2012 0404   CL 112* 10/13/2012 1201   CL 106 08/30/2012 0404   CO2 21* 10/13/2012 1201   CO2 17* 08/30/2012 0404   BUN 9.6 10/13/2012 1201   BUN 6 08/30/2012 0404   CREATININE 0.7 10/13/2012 1201   CREATININE 0.75 08/30/2012 0404      Component Value Date/Time   CALCIUM 8.9 10/13/2012 1201   CALCIUM 8.4 08/30/2012 0404   ALKPHOS 75 10/13/2012 1201   ALKPHOS 99 08/28/2012 0430   AST 14 10/13/2012 1201   AST 17 08/28/2012 0430   ALT 25 10/13/2012 1201   ALT 29 08/28/2012 0430   BILITOT 0.38 10/13/2012 1201   BILITOT 0.3 08/28/2012 0430       RADIOGRAPHIC STUDIES:    ASSESSMENT: 67 year old gentleman with  #1 periampullary carcinoma stage III patient is status post Whipple procedure overall she's done  well with it. You will not receive adjuvant chemotherapy initially consisting of Gemzar for one cycle. He will then go on to receive concomitant chemoradiation with chemotherapy consisting of xeloda. Risks and benefits of treatment have been discussed with them completely.  #2 patient has now completed the initial chemotherapy of one cycle of Gemzar. Unfortunately his course was complicated by development of abdominal pain weakness and fatigue and therefore he was admitted to the hospital with a small bowel obstruction this is now resolved.  #3 patient is now ready to get started on radiation therapy I have recommended this to the patient as well as his wife and through the interpreter. Certainly he will need Xeloda concurrently with the radiation. However due to patient's comorbidity I do think that we will have to reduce the dose is a low.during radiation therapy. They do understand this. I will make a referral back to Dr. Antony Blackbird.  #4 patient is tolerating radiation therapy well. He has now received his xeloda.by mail. I have recommended his begin the Xeloda 500 mg in the morning 500 mg in the evening.  #5 patient is tolerating the Xeloda very well. We will proceed with increasing his dose 1000 mg in the morning 500 mg in the evening.  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   PLAN:   #1patient will continue Xeloda concurrently with the radiation therapy. However I have increased his dose to 1000 mg in the morning and 500 mg in the evening.  #2 he will be seen back in one week's time for followup.  #3 his platelets look stable.  All questions were answered. The patient knows to call the clinic with any problems, questions or concerns. We can certainly see the patient much sooner if necessary.  I spent 25 minutes counseling the patient face to face. The total time spent in the appointment was 30 minutes.  Drue Second, MD Medical/Oncology [facility] [phone] (beeper) [phone] (Office)  09/08/2012, 11:09 AM

## 2012-10-13 NOTE — Telephone Encounter (Signed)
, °

## 2012-10-14 ENCOUNTER — Ambulatory Visit
Admission: RE | Admit: 2012-10-14 | Discharge: 2012-10-14 | Disposition: A | Discharge status: Home / Self Care | Payer: Medicare Other | Source: Ambulatory Visit | Attending: Radiation Oncology | Admitting: Radiation Oncology

## 2012-10-14 VITALS — BP 95/58 | HR 65 | Temp 97.5°F | Wt 133.9 lb

## 2012-10-14 DIAGNOSIS — C241 Malignant neoplasm of ampulla of Vater: Secondary | ICD-10-CM

## 2012-10-14 MED ORDER — RADIAPLEXRX EX GEL: Freq: Once | CUTANEOUS | Status: DC

## 2012-10-14 NOTE — Progress Notes (Signed)
[facility]    Radiation Oncology [address] [place]     Maryln Gottron, M.D. [place], [place] [address]               Billie Lade, M.D., Ph.D. Phone: [phone]      Molli Hazard A. Kathrynn Running, M.D. Fax: [phone]      Radene Gunning, M.D., Ph.D.         Lurline Hare, M.D.         Grayland Jack, M.D Weekly Treatment Management Note  Name: Colin Castro     MRN: [id number]        CSN: [id number] Date: 10/14/2012      DOB: 06/27/1946  CC: [name], MD         Algie Coffer    Status: Outpatient  Diagnosis: The encounter diagnosis was Ampullary carcinoma.  Current Dose: 23.4 Gy  Current Fraction: 13  Planned Dose: 45 Gy  Narrative: Colin Castro was seen today for weekly treatment management. The chart was checked and CBCT  were reviewed. He is tolerating his treatments well this week. He denies any nausea or diarrhea. His appetite continues to be better with Megace. He denies any abdominal pain.  Review of patient's allergies indicates no known allergies.  Current Outpatient Prescriptions  Medication Sig Dispense Refill  . acetaminophen (TYLENOL) 500 MG tablet Take 500 mg by mouth every 6 (six) hours as needed. For pain      . capecitabine (XELODA) 150 MG tablet Take by mouth 2 (two) times daily after a meal.      . capecitabine (XELODA) 500 MG tablet Take 1 tablet (500 mg total) by mouth 2 (two) times daily after a meal. Take Monday - Friday with radiation  60 tablet  2  . hydrOXYzine (ATARAX/VISTARIL) 10 MG tablet Take 10 mg by mouth 2 (two) times daily as needed. For itching      . lipase/protease/amylase (CREON) 12000 UNITS CPEP Take 2 capsules by mouth 3 (three) times daily before meals.  180 capsule  12  . megestrol (MEGACE) 400 MG/10ML suspension Take 10 mLs (400 mg total) by mouth 2 (two) times daily.  240 mL  3  . Multiple Vitamin (MULTIVITAMIN WITH MINERALS) TABS Take 1 tablet by mouth daily.      . ondansetron (ZOFRAN) 4 MG tablet Take 4 mg by mouth  every 6 (six) hours as needed. For nausea      . oxyCODONE-acetaminophen (PERCOCET/ROXICET) 5-325 MG per tablet Take 1-2 tablets by mouth every 4 (four) hours as needed. For pain  30 tablet  0  . polyethylene glycol powder (MIRALAX) powder Take 17 g by mouth daily.  255 g  4  . zolpidem (AMBIEN) 5 MG tablet Take 1 tablet (5 mg total) by mouth at bedtime as needed for sleep.  30 tablet  0   Current Facility-Administered Medications  Medication Dose Route Frequency Provider Last Rate Last Dose  . hyaluronate sodium (RADIAPLEXRX) gel   Topical Once Billie Lade, MD       Labs:  Lab Results  Component Value Date   WBC 5.9 10/13/2012   HGB 12.6* 10/13/2012   HCT 36.9* 10/13/2012   MCV 85.5 10/13/2012   PLT 107* 10/13/2012   Lab Results  Component Value Date   CREATININE 0.7 10/13/2012   BUN 9.6 10/13/2012   NA 142 10/13/2012   K 3.6 10/13/2012   CL 112* 10/13/2012   CO2 21* 10/13/2012   Lab Results  Component Value Date   ALT 25 10/13/2012   AST 14 10/13/2012   PHOS 2.8 08/29/2012   BILITOT 0.38 10/13/2012    Physical Examination:  weight is 133 lb 14.4 oz (60.737 kg). His temperature is 97.5 F (36.4 C). His blood pressure is 95/58 and his pulse is 65. His oxygen saturation is 100%.    Wt Readings from Last 3 Encounters:  10/14/12 133 lb 14.4 oz (60.737 kg)  10/13/12 133 lb 5 oz (60.47 kg)  10/07/12 133 lb 3.2 oz (60.419 kg)     Lungs - Normal respiratory effort, chest expands symmetrically. Lungs are clear to auscultation, no crackles or wheezes.  Heart has regular rhythm and rate  Abdomen is soft and non tender with normal bowel sounds  Assessment:  Patient tolerating treatments well  Plan: Continue treatment per original radiation prescription along with xeloda

## 2012-10-14 NOTE — Progress Notes (Signed)
Patient for weekly assessment of abdominal radiation.Completed 13 of 25 treatments.Denies pain or nausea.Appetite improved weight maintained for last 3 weeks.Will give additional radiaplex.

## 2012-10-15 ENCOUNTER — Ambulatory Visit
Admission: RE | Admit: 2012-10-15 | Discharge: 2012-10-15 | Disposition: A | Discharge status: Home / Self Care | Payer: Medicare Other | Source: Ambulatory Visit | Attending: Radiation Oncology | Admitting: Radiation Oncology

## 2012-10-16 ENCOUNTER — Ambulatory Visit
Admission: RE | Admit: 2012-10-16 | Discharge: 2012-10-16 | Disposition: A | Discharge status: Home / Self Care | Payer: Medicare Other | Source: Ambulatory Visit | Attending: Radiation Oncology | Admitting: Radiation Oncology

## 2012-10-16 NOTE — Progress Notes (Signed)
This encounter was created in error - please disregard.

## 2012-10-17 ENCOUNTER — Ambulatory Visit
Admission: RE | Admit: 2012-10-17 | Discharge: 2012-10-17 | Disposition: A | Discharge status: Home / Self Care | Payer: Medicare Other | Source: Ambulatory Visit | Attending: Radiation Oncology | Admitting: Radiation Oncology

## 2012-10-20 ENCOUNTER — Other Ambulatory Visit (HOSPITAL_BASED_OUTPATIENT_CLINIC_OR_DEPARTMENT_OTHER): Payer: Medicare Other | Admitting: Lab

## 2012-10-20 ENCOUNTER — Telehealth: Payer: Self-pay | Admitting: Oncology

## 2012-10-20 ENCOUNTER — Ambulatory Visit
Admission: RE | Admit: 2012-10-20 | Discharge: 2012-10-20 | Disposition: A | Discharge status: Home / Self Care | Payer: Medicare Other | Source: Ambulatory Visit | Attending: Radiation Oncology | Admitting: Radiation Oncology

## 2012-10-20 ENCOUNTER — Encounter: Payer: Self-pay | Admitting: Oncology

## 2012-10-20 ENCOUNTER — Ambulatory Visit (HOSPITAL_BASED_OUTPATIENT_CLINIC_OR_DEPARTMENT_OTHER): Payer: Medicaid Other | Admitting: Oncology

## 2012-10-20 VITALS — BP 95/67 | HR 85 | Temp 97.3°F | Resp 18 | Ht 66.0 in | Wt 133.9 lb

## 2012-10-20 DIAGNOSIS — D696 Thrombocytopenia, unspecified: Secondary | ICD-10-CM

## 2012-10-20 DIAGNOSIS — C241 Malignant neoplasm of ampulla of Vater: Secondary | ICD-10-CM

## 2012-10-20 LAB — CBC WITH DIFFERENTIAL/PLATELET
BASO%: 0.3 % (ref 0.0–2.0)
Eosinophils Absolute: 0.8 10*3/uL — ABNORMAL HIGH (ref 0.0–0.5)
LYMPH%: 13.7 % — ABNORMAL LOW (ref 14.0–49.0)
MCHC: 33.8 g/dL (ref 32.0–36.0)
MCV: 87.2 fL (ref 79.3–98.0)
MONO%: 11.7 % (ref 0.0–14.0)
NEUT#: 3.1 10*3/uL (ref 1.5–6.5)
Platelets: 135 10*3/uL — ABNORMAL LOW (ref 140–400)
RBC: 4.57 10*6/uL (ref 4.20–5.82)
RDW: 18.4 % — ABNORMAL HIGH (ref 11.0–14.6)
WBC: 5.3 10*3/uL (ref 4.0–10.3)

## 2012-10-20 LAB — COMPREHENSIVE METABOLIC PANEL (CC13)
ALT: 31 U/L (ref 0–55)
AST: 19 U/L (ref 5–34)
Albumin: 3.4 g/dL — ABNORMAL LOW (ref 3.5–5.0)
Alkaline Phosphatase: 82 U/L (ref 40–150)
Potassium: 3.8 mEq/L (ref 3.5–5.1)
Sodium: 141 mEq/L (ref 136–145)
Total Bilirubin: 0.47 mg/dL (ref 0.20–1.20)
Total Protein: 7.8 g/dL (ref 6.4–8.3)

## 2012-10-20 NOTE — Patient Instructions (Addendum)
Increase xeloda to 2 tablets in the morning and 2 tablets at night  I will see you back in 3/13 at 12:30

## 2012-10-20 NOTE — Telephone Encounter (Signed)
I gave pt an appt schedule for March...td

## 2012-10-20 NOTE — Progress Notes (Signed)
OFFICE PROGRESS NOTE  CC  Colin Rodriguez, MD [address] 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 He underwent pancreatic code duodenectomy. His final pathology revealed a well to moderately differentiated adenocarcinoma the tumor arose from high grade dysplasia involving small bowel mucosa in the ampulla invaded into the pancreas margins. The margins were negative LV I was noted one lymph node was positive for metastatic disease additional 14 lymph nodes were negative. Portal lymph node resection revealed 5 benign lymph nodes. Pathologically T3 N1.  #2 patient is now beginning adjuvant chemotherapy consisting of Gemzar x3 weeks.  #3 he was seen by Dr. Antony Blackbird for adjuvant radiation therapy with radiosensitizing Xeloda.  #4 patient was begun on radiation therapy about 3 weeks ago. Thus far he is tolerating it well without any significant problems. He began Xeloda 500 mg twice a day beginning on 10/07/2012. We will not increase his dose to 1000 in the morning 500 in the evenings.  CURRENT THERAPY: increase dose xeloda to 1000 mg in the morning 1000 mg in the evenings Monday through Friday.. Continue radiation as planned.  INTERVAL HISTORY: Colin Castro 67 y.o. male returns for followup visit he is accompanied by his wife.  Overall he is doing well he is tolerating Xeloda without any significant problems. He denies any fevers chills night sweats headaches shortness of breath chest pains palpitations no myalgias and arthralgias no diarrhea no bleeding. Remainder of the 10 point review of systems is negative MEDICAL HISTORY:  Past Medical History  Diagnosis Date  . Hypertension   . High cholesterol   . Coronary artery disease   .  Common bile duct (CBD) stricture     Malignant  . 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 Pneumonia   . Constipation   . Myocardial infarction     15 years ago    ALLERGIES:  has No Known Allergies.  MEDICATIONS:  Current Outpatient Prescriptions  Medication Sig Dispense Refill  . acetaminophen (TYLENOL) 500 MG tablet Take 500 mg by mouth every 6 (six) hours as needed. For pain      . capecitabine (XELODA) 150 MG tablet Take by mouth 2 (two) times daily after a meal.      . capecitabine (XELODA) 500 MG tablet Take 1 tablet (500 mg total) by mouth 2 (two) times daily after a meal. Take Monday - Friday with radiation  60 tablet  2  . lipase/protease/amylase (CREON) 12000 UNITS CPEP Take 2 capsules by mouth 3 (three) times daily before meals.  180 capsule  12  . Multiple Vitamin (MULTIVITAMIN WITH MINERALS) TABS Take 1 tablet by mouth daily.      . ondansetron (ZOFRAN) 4 MG tablet Take 4 mg by mouth every 6 (six) hours as needed. For nausea      . zolpidem (AMBIEN) 5 MG tablet Take 1 tablet (5 mg total) by mouth at bedtime as needed for sleep.  30 tablet  0  . hydrOXYzine (ATARAX/VISTARIL) 10 MG tablet Take 10 mg by mouth 2 (two) times daily as needed. For itching      . megestrol (MEGACE) 400 MG/10ML suspension Take 10 mLs (400 mg total) by mouth 2 (two) times daily.  240 mL  3  .  oxyCODONE-acetaminophen (PERCOCET/ROXICET) 5-325 MG per tablet Take 1-2 tablets by mouth every 4 (four) hours as needed. For pain  30 tablet  0  . polyethylene glycol powder (MIRALAX) powder Take 17 g by mouth daily.  255 g  4   No current facility-administered medications for this visit.    SURGICAL HISTORY:  Past Surgical History  Procedure Laterality Date  . Ercp  05/07/2012    Procedure: ENDOSCOPIC RETROGRADE CHOLANGIOPANCREATOGRAPHY (ERCP);  Surgeon: [name], MD;  Location: [facility];  Service: Endoscopy;  Laterality: N/A;  Dr  [name]  . Eus  05/07/2012    Procedure: UPPER ENDOSCOPIC ULTRASOUND (EUS) RADIAL;  Surgeon: [name], MD;  Location: [facility];  Service: Endoscopy;;  . Appendectomy      open, 25 yrs ago  . Coronary stent placement      2 stents placed  . Whipple procedure  06/06/2012    Procedure: WHIPPLE PROCEDURE;  Surgeon: [name], MD;  Location: [facility] OR;  Service: General;  Laterality: N/A;  Pancreatico duodenectomy  . Cholecystectomy  06/06/2012    Procedure: CHOLECYSTECTOMY;  Surgeon: [name], MD;  Location: [facility] OR;  Service: General;  Laterality: N/A;  . Pancreatic stent placement  06/06/2012    Procedure: PANCREATIC STENT PLACEMENT;  Surgeon: [name], MD;  Location: [facility] OR;  Service: General;  Laterality: N/A;  Removal of biliary stent; Placement of pacreatic stent  . Lymph node dissection  06/06/2012    Procedure: LYMPH NODE DISSECTION;  Surgeon: [name], MD;  Location: [facility] OR;  Service: General;  Laterality: N/A;  Portal lymph node dissection  . Coronary angioplasty      8 or 9 years ago  . Portacath placement  07/24/2012    Procedure: INSERTION PORT-A-CATH;  Surgeon: [name], MD;  Location: [facility] OR;  Service: General;  Laterality: Left;    REVIEW OF SYSTEMS:   General: fatigue (+), night sweats (-), fever (-), pain (-) Lymph: palpable nodes (-) HEENT: vision changes (-), mucositis (-), gum bleeding (-), epistaxis (-) Cardiovascular: chest pain (-), palpitations (-) Pulmonary: shortness of breath (-), dyspnea on exertion (-), cough (-), hemoptysis (-) GI:  Early satiety (-), melena (-), dysphagia (-), nausea/vomiting (-), diarrhea (-) GU: dysuria (-), hematuria (-), incontinence (-) Musculoskeletal: joint swelling (-), joint pain (-), back pain (-) Neuro: weakness (-), numbness (-), headache (-), confusion (-) Skin: Rash (-), lesions (-), dryness (-) Psych: depression (-), suicidal/homicidal ideation (-), feeling of hopelessness (-)   PHYSICAL  EXAMINATION: Blood pressure 95/67, pulse 85, temperature 97.3 F (36.3 C), temperature source Oral, resp. rate 18, height 5\' 6"  (1.676 m), weight 133 lb 14.4 oz (60.737 kg). Body mass index is 21.62 kg/(m^2). General: Patient is a well appearing male in no acute distress HEENT: PERRLA, sclerae anicteric no conjunctival pallor, MMM Neck: supple, no palpable adenopathy Lungs: clear to auscultation bilaterally, no wheezes, rhonchi, or rales Cardiovascular: tachycardic, regular rhythm, S1, S2, no murmurs, rubs or gallops Abdomen: Soft, non-distended, normoactive bowel sounds, no HSM, extremely tender to palpation, particularly the RLQ, with rebound.  Patient guarding abdomen when moving on exam table and grimacing while moving.  Extremities: warm and well perfused, no clubbing, cyanosis, or edema Skin: No rashes or lesions Neuro: Non-focal ECOG PERFORMANCE STATUS: 1 - Symptomatic but completely ambulatory   LABORATORY DATA: Lab Results  Component Value Date   WBC 5.3 10/20/2012   HGB 13.5 10/20/2012   HCT 39.9 10/20/2012   MCV 87.2 10/20/2012   PLT 135*  10/20/2012      Chemistry      Component Value Date/Time   NA 141 10/20/2012 1222   NA 134* 08/30/2012 0404   K 3.8 10/20/2012 1222   K 4.1 08/30/2012 0404   CL 108* 10/20/2012 1222   CL 106 08/30/2012 0404   CO2 23 10/20/2012 1222   CO2 17* 08/30/2012 0404   BUN 11.9 10/20/2012 1222   BUN 6 08/30/2012 0404   CREATININE 0.8 10/20/2012 1222   CREATININE 0.75 08/30/2012 0404      Component Value Date/Time   CALCIUM 9.3 10/20/2012 1222   CALCIUM 8.4 08/30/2012 0404   ALKPHOS 82 10/20/2012 1222   ALKPHOS 99 08/28/2012 0430   AST 19 10/20/2012 1222   AST 17 08/28/2012 0430   ALT 31 10/20/2012 1222   ALT 29 08/28/2012 0430   BILITOT 0.47 10/20/2012 1222   BILITOT 0.3 08/28/2012 0430       RADIOGRAPHIC STUDIES:    ASSESSMENT: 67 year old gentleman with  #1 periampullary carcinoma stage III patient is status post Whipple procedure overall she's done well with it.  You will not receive adjuvant chemotherapy initially consisting of Gemzar for one cycle. He will then go on to receive concomitant chemoradiation with chemotherapy consisting of xeloda. Risks and benefits of treatment have been discussed with them completely.  #2 patient has now completed the initial chemotherapy of one cycle of Gemzar. Unfortunately his course was complicated by development of abdominal pain weakness and fatigue and therefore he was admitted to the hospital with a small bowel obstruction this is now resolved.  #3 patient is now ready to get started on radiation therapy I have recommended this to the patient as well as his wife and through the interpreter. Certainly he will need Xeloda concurrently with the radiation. However due to patient's comorbidity I do think that we will have to reduce the dose is a low.during radiation therapy. They do understand this. I will make a referral back to Dr. Antony Blackbird.  #4 patient is tolerating radiation therapy well. He has now received his xeloda.by mail. I have recommended his begin the Xeloda 500 mg in the morning 500 mg in the evening.  #5 patient is tolerating the Xeloda very well. We will proceed with increasing his dose 1000 mg in the morning 500 mg in the evening.  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   PLAN:    #1 patient will continue Xeloda concurrently with the radiation therapy. However I have increased his dose to 1000 mg in the morning and 1000 mg in the evening.  #2 he will be seen back in one week's time for followup.  #3 his platelets look stable.  All questions were answered. The patient knows to call the clinic with any problems, questions or concerns. We can certainly see the patient much sooner if necessary.  I spent 25 minutes counseling the patient face to face. The total time spent in the appointment was 30 minutes.  Drue Second, MD Medical/Oncology [facility] [phone] (beeper) [phone] (Office)  09/08/2012, 11:09 AM

## 2012-10-21 ENCOUNTER — Ambulatory Visit
Admission: RE | Admit: 2012-10-21 | Discharge: 2012-10-21 | Disposition: A | Discharge status: Home / Self Care | Payer: Medicare Other | Source: Ambulatory Visit | Attending: Radiation Oncology | Admitting: Radiation Oncology

## 2012-10-21 ENCOUNTER — Encounter: Payer: Self-pay | Admitting: Radiation Oncology

## 2012-10-21 VITALS — BP 95/58 | HR 60 | Resp 18 | Wt 134.5 lb

## 2012-10-21 DIAGNOSIS — C241 Malignant neoplasm of ampulla of Vater: Secondary | ICD-10-CM

## 2012-10-21 NOTE — Progress Notes (Signed)
Patient presents to the clinic today accompanied by his wife and an interpretor for PUT with Dr. Roselind Messier. Patient is alert and oriented to person, place, ane time. No distress noted. Steady gait noted. Pleasant affect noted. Patient denies abdominal pain. Patient denies nausea, vomiting, headache or dizziness. Patient reports having a form bowel movement this morning. Patient has no complaints at this time. Patient reports taking xeloda as directed. Reported all findings to Dr. Roselind Messier.

## 2012-10-21 NOTE — Progress Notes (Signed)
Opened in error

## 2012-10-21 NOTE — Progress Notes (Signed)
[facility]    Radiation Oncology [address] [place]     Maryln Gottron, M.D. [place], [place] [address]               Billie Lade, M.D., Ph.D. Phone: [phone]      Molli Hazard A. Kathrynn Running, M.D. Fax: [phone]      Radene Gunning, M.D., Ph.D.         Lurline Hare, M.D.         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  Current Dose: 30.6 Gy  Current Fraction: 17  Planned Dose: 45 Gy  Narrative: Colin Castro was seen today for weekly treatment management. The chart was checked and CBCT  were reviewed. He continues to tolerate his treatments well. He denies any abdominal pain,  nausea constipation or diarrhea. His energy level has improved over the past week.  Review of patient's allergies indicates no known allergies.  Current Outpatient Prescriptions  Medication Sig Dispense Refill  . acetaminophen (TYLENOL) 500 MG tablet Take 500 mg by mouth every 6 (six) hours as needed. For pain      . capecitabine (XELODA) 150 MG tablet Take by mouth 2 (two) times daily after a meal.      . capecitabine (XELODA) 500 MG tablet Take 1 tablet (500 mg total) by mouth 2 (two) times daily after a meal. Take Monday - Friday with radiation  60 tablet  2  . hydrOXYzine (ATARAX/VISTARIL) 10 MG tablet Take 10 mg by mouth 2 (two) times daily as needed. For itching      . lipase/protease/amylase (CREON) 12000 UNITS CPEP Take 2 capsules by mouth 3 (three) times daily before meals.  180 capsule  12  . megestrol (MEGACE) 400 MG/10ML suspension Take 10 mLs (400 mg total) by mouth 2 (two) times daily.  240 mL  3  . Multiple Vitamin (MULTIVITAMIN WITH MINERALS) TABS Take 1 tablet by mouth daily.      . ondansetron (ZOFRAN) 4 MG tablet Take 4 mg by mouth  every 6 (six) hours as needed. For nausea      . oxyCODONE-acetaminophen (PERCOCET/ROXICET) 5-325 MG per tablet Take 1-2 tablets by mouth every 4 (four) hours as needed. For pain  30 tablet  0  . polyethylene glycol powder (MIRALAX) powder Take 17 g by mouth daily.  255 g  4  . zolpidem (AMBIEN) 5 MG tablet Take 1 tablet (5 mg total) by mouth at bedtime as needed for sleep.  30 tablet  0   No current facility-administered medications for this encounter.   Labs:  Lab Results  Component Value Date   WBC 5.3 10/20/2012   HGB 13.5 10/20/2012   HCT 39.9 10/20/2012   MCV 87.2 10/20/2012   PLT 135* 10/20/2012   Lab Results  Component Value Date   CREATININE 0.8 10/20/2012   BUN 11.9 10/20/2012   NA 141 10/20/2012   K 3.8 10/20/2012   CL 108* 10/20/2012   CO2 23 10/20/2012   Lab Results  Component Value Date   ALT 31 10/20/2012   AST 19 10/20/2012   PHOS 2.8 08/29/2012   BILITOT 0.47 10/20/2012  Physical Examination:  weight is 134 lb 8 oz (61.009 kg). His blood pressure is 95/58 and his pulse is 60. His respiration is 18.    Wt Readings from Last 3 Encounters:  10/21/12 134 lb 8 oz (61.009 kg)  10/20/12 133 lb 14.4 oz (60.737 kg)  10/14/12 133 lb 14.4 oz (60.737 kg)    The oral cavity is moist without secondary infection. Lungs - Normal respiratory effort, chest expands symmetrically. Lungs are clear to auscultation, no crackles or wheezes.  Heart has regular rhythm and rate  Abdomen is soft and non tender with normal bowel sounds  Assessment:  Patient tolerating treatments well  Plan: Continue treatment per original radiation prescription

## 2012-10-22 ENCOUNTER — Ambulatory Visit
Admission: RE | Admit: 2012-10-22 | Discharge: 2012-10-22 | Disposition: A | Discharge status: Home / Self Care | Payer: Medicare Other | Source: Ambulatory Visit | Attending: Radiation Oncology | Admitting: Radiation Oncology

## 2012-10-23 ENCOUNTER — Ambulatory Visit
Admission: RE | Admit: 2012-10-23 | Discharge: 2012-10-23 | Disposition: A | Discharge status: Home / Self Care | Payer: Medicare Other | Source: Ambulatory Visit | Attending: Radiation Oncology | Admitting: Radiation Oncology

## 2012-10-24 ENCOUNTER — Ambulatory Visit: Payer: Medicare Other

## 2012-10-27 ENCOUNTER — Ambulatory Visit
Admission: RE | Admit: 2012-10-27 | Discharge: 2012-10-27 | Disposition: A | Discharge status: Home / Self Care | Payer: Medicare Other | Source: Ambulatory Visit | Attending: Radiation Oncology | Admitting: Radiation Oncology

## 2012-10-28 ENCOUNTER — Ambulatory Visit
Admission: RE | Admit: 2012-10-28 | Discharge: 2012-10-28 | Disposition: A | Discharge status: Home / Self Care | Payer: Medicare Other | Source: Ambulatory Visit | Attending: Radiation Oncology | Admitting: Radiation Oncology

## 2012-10-28 ENCOUNTER — Ambulatory Visit: Payer: Medicare Other

## 2012-10-28 VITALS — BP 105/65 | HR 58 | Temp 97.4°F | Wt 132.5 lb

## 2012-10-28 DIAGNOSIS — C241 Malignant neoplasm of ampulla of Vater: Secondary | ICD-10-CM

## 2012-10-28 NOTE — Progress Notes (Signed)
[facility]    Radiation Oncology [address] [place]     Maryln Gottron, M.D. [place], [place] [address]               Billie Lade, M.D., Ph.D. Phone: [phone]      Molli Hazard A. Kathrynn Running, M.D. Fax: [phone]      Radene Gunning, M.D., Ph.D.         Lurline Hare, M.D.         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  Current Dose: 39.6 Gy  Current Fraction: 22  Planned Dose: 45 Gy  Narrative: Colin Castro was seen today for weekly treatment management. The chart was checked and CBCT  were reviewed. He continues to tolerate his treatments well at this time. He denies any nausea or abdominal discomfort or diarrhea.  Review of patient's allergies indicates no known allergies. Current Outpatient Prescriptions  Medication Sig Dispense Refill  . acetaminophen (TYLENOL) 500 MG tablet Take 500 mg by mouth every 6 (six) hours as needed. For pain      . capecitabine (XELODA) 150 MG tablet Take by mouth 2 (two) times daily after a meal.      . capecitabine (XELODA) 500 MG tablet Take 1 tablet (500 mg total) by mouth 2 (two) times daily after a meal. Take Monday - Friday with radiation  60 tablet  2  . hydrOXYzine (ATARAX/VISTARIL) 10 MG tablet Take 10 mg by mouth 2 (two) times daily as needed. For itching      . lipase/protease/amylase (CREON) 12000 UNITS CPEP Take 2 capsules by mouth 3 (three) times daily before meals.  180 capsule  12  . megestrol (MEGACE) 400 MG/10ML suspension Take 10 mLs (400 mg total) by mouth 2 (two) times daily.  240 mL  3  . Multiple Vitamin (MULTIVITAMIN WITH MINERALS) TABS Take 1 tablet by mouth daily.      . ondansetron (ZOFRAN) 4 MG tablet Take 4 mg by mouth every 6 (six) hours as needed. For nausea      .  oxyCODONE-acetaminophen (PERCOCET/ROXICET) 5-325 MG per tablet Take 1-2 tablets by mouth every 4 (four) hours as needed. For pain  30 tablet  0  . polyethylene glycol powder (MIRALAX) powder Take 17 g by mouth daily.  255 g  4  . zolpidem (AMBIEN) 5 MG tablet Take 1 tablet (5 mg total) by mouth at bedtime as needed for sleep.  30 tablet  0   No current facility-administered medications for this encounter.   Labs:  Lab Results  Component Value Date   WBC 5.3 10/20/2012   HGB 13.5 10/20/2012   HCT 39.9 10/20/2012   MCV 87.2 10/20/2012   PLT 135* 10/20/2012   Lab Results  Component Value Date   CREATININE 0.8 10/20/2012   BUN 11.9 10/20/2012   NA 141 10/20/2012   K 3.8 10/20/2012   CL 108* 10/20/2012   CO2 23 10/20/2012   Lab Results  Component Value Date   ALT 31 10/20/2012   AST 19 10/20/2012   PHOS 2.8 08/29/2012   BILITOT 0.47 10/20/2012    Physical Examination:  weight is 132 lb  8 oz (60.102 kg). His temperature is 97.4 F (36.3 C). His blood pressure is 105/65 and his pulse is 58. His oxygen saturation is 100%.    Wt Readings from Last 3 Encounters:  10/28/12 132 lb 8 oz (60.102 kg)  10/21/12 134 lb 8 oz (61.009 kg)  10/20/12 133 lb 14.4 oz (60.737 kg)     Lungs - Normal respiratory effort, chest expands symmetrically. Lungs are clear to auscultation, no crackles or wheezes.  Heart has regular rhythm and rate  Abdomen is soft and non tender with normal bowel sounds  Assessment:  Patient tolerating treatments well  Plan: Continue treatment per original radiation prescription

## 2012-10-28 NOTE — Progress Notes (Signed)
Patient here for weekly assessment of radiation to abdomen.Denies pain or nausea.Has no more xeloda but has only 3 remaining treatments.

## 2012-10-29 ENCOUNTER — Ambulatory Visit (INDEPENDENT_AMBULATORY_CARE_PROVIDER_SITE_OTHER): Payer: Medicare Other | Admitting: General Surgery

## 2012-10-29 ENCOUNTER — Ambulatory Visit
Admission: RE | Admit: 2012-10-29 | Discharge: 2012-10-29 | Disposition: A | Discharge status: Home / Self Care | Payer: Medicare Other | Source: Ambulatory Visit | Attending: Radiation Oncology | Admitting: Radiation Oncology

## 2012-10-29 ENCOUNTER — Ambulatory Visit: Payer: Medicare Other

## 2012-10-29 ENCOUNTER — Encounter (INDEPENDENT_AMBULATORY_CARE_PROVIDER_SITE_OTHER): Payer: Self-pay | Admitting: General Surgery

## 2012-10-29 VITALS — BP 110/68 | HR 74 | Temp 98.8°F | Resp 18 | Ht 66.0 in | Wt 136.6 lb

## 2012-10-29 DIAGNOSIS — K612 Anorectal abscess: Secondary | ICD-10-CM

## 2012-10-29 DIAGNOSIS — K610 Anal abscess: Secondary | ICD-10-CM

## 2012-10-29 NOTE — Patient Instructions (Signed)
Home Instructions Following Incision and Drainage of Perirectal Abscess  Wound care - A dressing has been applied to control any bleeding or drainage immediately after your procedure.  You may remove this dressing at your first bowel movement or tomorrow morning, whichever comes first.  There may be packing inside your wound as well that should be removed with the dressing.  You do not need to repack the area.  After the dressing is removed, clean the area gently with a mild soap and warm water and place a piece of 100% cotton over the area.  Change to cotton ever 1-3 hours while awake to keep the area clean and dry.   - Beginning tomorrow, sit in a tub of warm water for 15-20 minutes at least twice a day and after bowel movements.  This will help with healing, pain and discomfort. - A small amount of bleeding is to be expected.  If you notice an increase in the bleeding, place a large piece of cotton (about the size of a golf ball) next to the anal opening and sit on a hard surface for 15 minutes.  If the bleeding persists or if you are concerned, please call the office.  Do not sit on rubber rings.  Instead, sit on a soft pillow.    Diet -Eat a regular diet.  Avoid foods that may constipate you or give you diarrhea.  Drink 6-8 glasses of water a day and avoid seeds, nuts and popcorn until the area heals.  Medication -Take pain medication as directed.  Do not drive or operate machinery if you are taking a prescription pain medication.   - We recommend Extra Strength Tylenol for mild to moderate pain.  This can be taken as instructed on the bottle.   - If you are given a prescription for antibiotics, take as instructed by your doctor until the entire course is completed  Bowel Habits Avoid laxatives unless instructed by your doctor. Take a fiber supplement twice a day (Metamucil, FiberCon, Benefiber) Avoid excessive straining to have a bowel movement Do not go for more than 3 days without a bowel  movement.  Take a regular Fleet enema if you are constipated.  Call the office if unable to do this or no results.    Activity Resume activities as tolerated beginning tomorrow.  Avoid strenuous activities or sports for one week.    Call the office if you have any questions.  Call IMMEDIATELY if you should develop persistent heavy rectal bleeding, increase in pain, difficulty urinating or fever greater than 100 F.   

## 2012-10-29 NOTE — Progress Notes (Signed)
Chief Complaint  Patient presents with  . Follow-up    ? peri-rectal abscess    HISTORY:  Colin Castro is a 67 y.o. male who presents to clinic with anal pain.  He denies fevers or bleeding.  He is having trouble sitting.  He is finishing RT for pancreatic cancer.  He has not had any xeloda for the past week.  Past Medical History  Diagnosis Date  . Hypertension   . High cholesterol   . Coronary artery disease   . Common bile duct (CBD) stricture     Malignant  . 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 Pneumonia   . Constipation   . Myocardial infarction     15 years ago       Past Surgical History  Procedure Laterality Date  . Ercp  05/07/2012    Procedure: ENDOSCOPIC RETROGRADE CHOLANGIOPANCREATOGRAPHY (ERCP);  Surgeon: [name], MD;  Location: [facility];  Service: Endoscopy;  Laterality: N/A;  Dr [name]  . Eus  05/07/2012    Procedure: UPPER ENDOSCOPIC ULTRASOUND (EUS) RADIAL;  Surgeon: [name], MD;  Location: [facility];  Service: Endoscopy;;  . Appendectomy      open, 25 yrs ago  . Coronary stent placement      2 stents placed  . Whipple procedure  06/06/2012    Procedure: WHIPPLE PROCEDURE;  Surgeon: [name], MD;  Location: [facility] OR;  Service: General;  Laterality: N/A;  Pancreatico duodenectomy  . Cholecystectomy  06/06/2012    Procedure: CHOLECYSTECTOMY;  Surgeon: [name], MD;  Location: [facility] OR;  Service: General;  Laterality: N/A;  . Pancreatic stent placement  06/06/2012    Procedure: PANCREATIC STENT PLACEMENT;  Surgeon: [name], MD;  Location: [facility] OR;  Service: General;  Laterality: N/A;  Removal of biliary stent; Placement of pacreatic stent  . Lymph node dissection  06/06/2012    Procedure: LYMPH NODE DISSECTION;  Surgeon: [name], MD;  Location: [facility] OR;  Service: General;  Laterality: N/A;  Portal lymph node dissection  . Coronary angioplasty      8 or 9 years  ago  . Portacath placement  07/24/2012    Procedure: INSERTION PORT-A-CATH;  Surgeon: [name], MD;  Location: [facility] OR;  Service: General;  Laterality: Left;      Current Outpatient Prescriptions  Medication Sig Dispense Refill  . acetaminophen (TYLENOL) 500 MG tablet Take 500 mg by mouth every 6 (six) hours as needed. For pain      . capecitabine (XELODA) 150 MG tablet Take by mouth 2 (two) times daily after a meal.      . capecitabine (XELODA) 500 MG tablet Take 1 tablet (500 mg total) by mouth 2 (two) times daily after a meal. Take Monday - Friday with radiation  60 tablet  2  . hydrOXYzine (ATARAX/VISTARIL) 10 MG tablet Take 10 mg by mouth 2 (two) times daily as needed. For itching      . lipase/protease/amylase (CREON) 12000 UNITS CPEP Take 2 capsules by mouth 3 (three) times daily before meals.  180 capsule  12  . megestrol (MEGACE) 400 MG/10ML suspension Take 10 mLs (400 mg total) by mouth 2 (two) times daily.  240 mL  3  . Multiple Vitamin (MULTIVITAMIN WITH MINERALS) TABS Take 1 tablet by mouth daily.      . ondansetron (ZOFRAN) 4 MG tablet Take 4 mg by mouth every 6 (six) hours as  needed. For nausea      . polyethylene glycol powder (MIRALAX) powder Take 17 g by mouth daily.  255 g  4  . zolpidem (AMBIEN) 5 MG tablet Take 1 tablet (5 mg total) by mouth at bedtime as needed for sleep.  30 tablet  0   No current facility-administered medications for this visit.     No Known Allergies    Family History  Problem Relation Age of Onset  . Ovarian cancer Mother     lived 25 years past diagnosis      History   Social History  . Marital Status: Married    Spouse Name: N/A    Number of Children: 4  . Years of Education: N/A   Occupational History  .      out of work   Social History Main Topics  . Smoking status: Current Every Day Smoker -- 0.20 packs/day for 48 years    Types: Cigarettes  . Smokeless tobacco: Never Used  . Alcohol Use: No  . Drug Use: No  . Sexually  Active: No   Other Topics Concern  . None   Social History Narrative  . None       REVIEW OF SYSTEMS - PERTINENT POSITIVES ONLY: Review of Systems - General ROS: negative for - chills or fever Hematological and Lymphatic ROS: negative for - bleeding problems Respiratory ROS: no cough, shortness of breath, or wheezing Cardiovascular ROS: no chest pain or dyspnea on exertion Gastrointestinal ROS: no abdominal pain, change in bowel habits, or black or bloody stools  EXAM: Filed Vitals:   10/29/12 1520  BP: 110/68  Pulse: 74  Temp: 98.8 F (37.1 C)  Resp: 18    General appearance: alert and cooperative GI: soft, non-tender; bowel sounds normal; no masses,  no organomegaly Anterior anal fluctuant mass  Procedure: I&D perianal abscess Surgeon: Maisie Fus Assistant: Hazle Coca After the risks and benefits were explained, verbal consent was obtained for above procedure  Anesthesia: local, lidocaine with epi SQ Diagnosis: anterior perianal abscess Findings: small anterior abscess, cruciate incision made, purulence expressed, good hemostasis, covered with a dry dressing.    ASSESSMENT AND PLAN: Colin Castro is a 67 y.o. M who presented to urgent clinic with a perianal abscess.  This was drained in the office.  He will do sitz baths at home.  Detailed instructions given to patient.  I will see him back in 2 weeks.   Vanita Panda, MD Colon and Rectal Surgery / General Surgery [facility]      Visit Diagnoses: No diagnosis found.  Primary Care Physician: [name], MD

## 2012-10-30 ENCOUNTER — Other Ambulatory Visit: Payer: Medicare Other | Admitting: Lab

## 2012-10-30 ENCOUNTER — Ambulatory Visit: Payer: Medicare Other

## 2012-10-30 ENCOUNTER — Telehealth: Payer: Self-pay | Admitting: Oncology

## 2012-10-30 ENCOUNTER — Ambulatory Visit
Admission: RE | Admit: 2012-10-30 | Discharge: 2012-10-30 | Disposition: A | Discharge status: Home / Self Care | Payer: Medicare Other | Source: Ambulatory Visit | Attending: Radiation Oncology | Admitting: Radiation Oncology

## 2012-10-30 ENCOUNTER — Ambulatory Visit (HOSPITAL_BASED_OUTPATIENT_CLINIC_OR_DEPARTMENT_OTHER): Payer: Medicare Other | Admitting: Oncology

## 2012-10-30 ENCOUNTER — Encounter: Payer: Self-pay | Admitting: Oncology

## 2012-10-30 ENCOUNTER — Other Ambulatory Visit (HOSPITAL_BASED_OUTPATIENT_CLINIC_OR_DEPARTMENT_OTHER): Payer: Medicare Other | Admitting: Lab

## 2012-10-30 VITALS — BP 96/66 | HR 86 | Temp 97.6°F | Resp 20 | Ht 66.0 in | Wt 136.3 lb

## 2012-10-30 DIAGNOSIS — C241 Malignant neoplasm of ampulla of Vater: Secondary | ICD-10-CM

## 2012-10-30 DIAGNOSIS — D696 Thrombocytopenia, unspecified: Secondary | ICD-10-CM

## 2012-10-30 LAB — COMPREHENSIVE METABOLIC PANEL (CC13)
AST: 16 U/L (ref 5–34)
Alkaline Phosphatase: 89 U/L (ref 40–150)
Glucose: 104 mg/dl — ABNORMAL HIGH (ref 70–99)
Sodium: 141 mEq/L (ref 136–145)
Total Bilirubin: 0.53 mg/dL (ref 0.20–1.20)
Total Protein: 7.3 g/dL (ref 6.4–8.3)

## 2012-10-30 LAB — CBC WITH DIFFERENTIAL/PLATELET
BASO%: 0.3 % (ref 0.0–2.0)
EOS%: 7.5 % — ABNORMAL HIGH (ref 0.0–7.0)
Eosinophils Absolute: 0.5 10*3/uL (ref 0.0–0.5)
LYMPH%: 10.2 % — ABNORMAL LOW (ref 14.0–49.0)
MCH: 30.4 pg (ref 27.2–33.4)
MCHC: 34.2 g/dL (ref 32.0–36.0)
MCV: 88.9 fL (ref 79.3–98.0)
MONO%: 16.5 % — ABNORMAL HIGH (ref 0.0–14.0)
Platelets: 114 10*3/uL — ABNORMAL LOW (ref 140–400)
RBC: 4.16 10*6/uL — ABNORMAL LOW (ref 4.20–5.82)
RDW: 18 % — ABNORMAL HIGH (ref 11.0–14.6)

## 2012-10-30 MED ORDER — PROCHLORPERAZINE 25 MG RE SUPP: 25.0000 mg | Freq: Two times a day (BID) | RECTAL | Status: DC | PRN

## 2012-10-30 MED ORDER — PROCHLORPERAZINE MALEATE 10 MG PO TABS: 10.0000 mg | ORAL_TABLET | Freq: Four times a day (QID) | ORAL | Status: DC | PRN

## 2012-10-30 MED ORDER — LORAZEPAM 0.5 MG PO TABS: 0.5000 mg | ORAL_TABLET | Freq: Four times a day (QID) | ORAL | Status: DC | PRN

## 2012-10-30 MED ORDER — SODIUM CHLORIDE 0.9 % IJ SOLN
10.0000 mL | INTRAMUSCULAR | Status: AC | PRN
  Administered 2012-10-30: 10 mL via INTRAVENOUS
  Filled 2012-10-30: qty 10

## 2012-10-30 MED ORDER — HEPARIN SOD (PORK) LOCK FLUSH 100 UNIT/ML IV SOLN
500.0000 [IU] | Freq: Once | INTRAVENOUS | Status: AC
  Administered 2012-10-30: 500 [IU] via INTRAVENOUS
  Filled 2012-10-30: qty 5

## 2012-10-30 MED ORDER — ONDANSETRON HCL 8 MG PO TABS: 8.0000 mg | ORAL_TABLET | Freq: Two times a day (BID) | ORAL | Status: DC | PRN

## 2012-10-30 NOTE — Telephone Encounter (Signed)
gv pt appt schedule for April and May. °

## 2012-10-30 NOTE — Patient Instructions (Addendum)
After completion of radiation you will proceed with gemzar starting on 11/20/12

## 2012-10-31 ENCOUNTER — Ambulatory Visit: Payer: Medicare Other

## 2012-10-31 ENCOUNTER — Ambulatory Visit
Admission: RE | Admit: 2012-10-31 | Discharge: 2012-10-31 | Disposition: A | Discharge status: Home / Self Care | Payer: Medicare Other | Source: Ambulatory Visit | Attending: Radiation Oncology | Admitting: Radiation Oncology

## 2012-10-31 VITALS — BP 100/69 | HR 84 | Temp 97.1°F

## 2012-10-31 DIAGNOSIS — C241 Malignant neoplasm of ampulla of Vater: Secondary | ICD-10-CM

## 2012-10-31 NOTE — Progress Notes (Signed)
Department of Radiation Oncology  Phone:  [phone] Fax:        [phone]  Weekly Treatment Note    Name: Colin Castro Date: 10/31/2012 MRN: [id number] DOB: Dec 26, 1945   Current dose: 45 Gy  Current fraction: 25   MEDICATIONS: Current Outpatient Prescriptions  Medication Sig Dispense Refill  . acetaminophen (TYLENOL) 500 MG tablet Take 500 mg by mouth every 6 (six) hours as needed. For pain      . capecitabine (XELODA) 150 MG tablet Take by mouth 2 (two) times daily after a meal.      . capecitabine (XELODA) 500 MG tablet Take 1 tablet (500 mg total) by mouth 2 (two) times daily after a meal. Take Monday - Friday with radiation  60 tablet  2  . hydrOXYzine (ATARAX/VISTARIL) 10 MG tablet Take 10 mg by mouth 2 (two) times daily as needed. For itching      . lipase/protease/amylase (CREON) 12000 UNITS CPEP Take 2 capsules by mouth 3 (three) times daily before meals.  180 capsule  12  . LORazepam (ATIVAN) 0.5 MG tablet Take 1 tablet (0.5 mg total) by mouth every 6 (six) hours as needed (Nausea or vomiting).  30 tablet  0  . megestrol (MEGACE) 400 MG/10ML suspension Take 10 mLs (400 mg total) by mouth 2 (two) times daily.  240 mL  3  . Multiple Vitamin (MULTIVITAMIN WITH MINERALS) TABS Take 1 tablet by mouth daily.      . ondansetron (ZOFRAN) 4 MG tablet Take 4 mg by mouth every 6 (six) hours as needed. For nausea      . ondansetron (ZOFRAN) 8 MG tablet Take 1 tablet (8 mg total) by mouth 2 (two) times daily as needed (Nausea or vomiting).  30 tablet  1  . polyethylene glycol powder (MIRALAX) powder Take 17 g by mouth daily.  255 g  4  . prochlorperazine (COMPAZINE) 10 MG tablet Take 1 tablet (10 mg total) by mouth every 6 (six) hours as needed (Nausea or vomiting).  30 tablet  1  . prochlorperazine (COMPAZINE) 25 MG suppository Place 1 suppository (25 mg total) rectally every 12 (twelve) hours as needed for nausea.  12 suppository  3  . zolpidem (AMBIEN) 5 MG tablet Take 1 tablet  (5 mg total) by mouth at bedtime as needed for sleep.  30 tablet  0   No current facility-administered medications for this encounter.   Facility-Administered Medications Ordered in Other Encounters  Medication Dose Route Frequency Pius Byrom Last Rate Last Dose  . sodium chloride 0.9 % injection 10 mL  10 mL Intravenous PRN Victorino December, MD   10 mL at 10/30/12 1409     ALLERGIES: Review of patient's allergies indicates no known allergies.   LABORATORY DATA:  Lab Results  Component Value Date   WBC 6.5 10/30/2012   HGB 12.6* 10/30/2012   HCT 36.9* 10/30/2012   MCV 88.9 10/30/2012   PLT 114* 10/30/2012   Lab Results  Component Value Date   NA 141 10/30/2012   K 4.1 10/30/2012   CL 108* 10/30/2012   CO2 25 10/30/2012   Lab Results  Component Value Date   ALT 26 10/30/2012   AST 16 10/30/2012   ALKPHOS 89 10/30/2012   BILITOT 0.53 10/30/2012     NARRATIVE: Colin Castro was seen today for weekly treatment management. The chart was checked and the patient's films were reviewed. The patient finished his final fraction today. He indicates that he has done well.  No nausea and no other GI issues. He currently denies pain.  PHYSICAL EXAMINATION: temperature is 97.1 F (36.2 C). His blood pressure is 100/69 and his pulse is 84. His oxygen saturation is 100%.        ASSESSMENT: The patient did satisfactorily with treatment.  PLAN: Followup in one month.

## 2012-10-31 NOTE — Progress Notes (Signed)
Patient completes today.No pain or nausea.Completes 25 radiation treatments to abdomen for pancreatic cancer.

## 2012-11-03 ENCOUNTER — Ambulatory Visit: Payer: Medicare Other

## 2012-11-05 ENCOUNTER — Encounter: Payer: Self-pay | Admitting: Radiation Oncology

## 2012-11-05 NOTE — Progress Notes (Signed)
  Radiation Oncology         [phone] ________________________________  Name: Mica Releford MRN: [id number]  Date: 11/05/2012  DOB: 09-29-1945  End of Treatment Note  Diagnosis:    Stage II-B adenocarcinoma of the ampulla     Indication for treatment:  Post Op, Adjuvant       Radiation treatment dates:    09/24/12-10/31/12                                                                                                                                          Site/dose:   Porta hepatis area/upper abdomen, 45 Gy in 25 Fractions  Beams/energy:   3-D conformal with multiple beams  Narrative: The patient tolerated radiation treatment relatively well.   Once he started on Megace he was able to tolerate the treatment much better.  Plan: The patient has completed radiation treatment. The patient will return to radiation oncology clinic for routine followup in one month. I advised them to call or return sooner if they have any questions or concerns related to their recovery or treatment.  -----------------------------------  Billie Lade, PhD, MD

## 2012-11-09 NOTE — Progress Notes (Signed)
OFFICE PROGRESS NOTE  CC  Colin Rodriguez, MD [address] 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 He underwent pancreatic code duodenectomy. His final pathology revealed a well to moderately differentiated adenocarcinoma the tumor arose from high grade dysplasia involving small bowel mucosa in the ampulla invaded into the pancreas margins. The margins were negative LV I was noted one lymph node was positive for metastatic disease additional 14 lymph nodes were negative. Portal lymph node resection revealed 5 benign lymph nodes. Pathologically T3 N1.  #2 patient is now beginning adjuvant chemotherapy consisting of Gemzar x3 weeks.  #3 he was seen by Dr. Antony Blackbird for adjuvant radiation therapy with radiosensitizing Xeloda.  #4 patient was begun on radiation therapy about 3 weeks ago. Thus far he is tolerating it well without any significant problems. He began Xeloda 500 mg twice a day beginning on 10/07/2012. We will not increase his dose to 1000 in the morning 500 in the evenings.  CURRENT THERAPY: increase dose xeloda to 1000 mg in the morning 1000 mg in the evenings Monday through Friday.. Continue radiation as planned.  INTERVAL HISTORY: Colin Castro 67 y.o. male returns for followup visit he is accompanied by his wife.  Overall he is doing well he is tolerating Xeloda without any significant problems. He denies any fevers chills night sweats headaches shortness of breath chest pains palpitations no myalgias and arthralgias no diarrhea no bleeding. Remainder of the 10 point review of systems is negative MEDICAL HISTORY:  Past Medical History  Diagnosis Date  . Hypertension   . High cholesterol   . Coronary artery disease   .  Common bile duct (CBD) stricture     Malignant  . 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 Pneumonia   . Constipation   . Myocardial infarction     15 years ago    ALLERGIES:  has No Known Allergies.  MEDICATIONS:  Current Outpatient Prescriptions  Medication Sig Dispense Refill  . acetaminophen (TYLENOL) 500 MG tablet Take 500 mg by mouth every 6 (six) hours as needed. For pain      . lipase/protease/amylase (CREON) 12000 UNITS CPEP Take 2 capsules by mouth 3 (three) times daily before meals.  180 capsule  12  . megestrol (MEGACE) 400 MG/10ML suspension Take 10 mLs (400 mg total) by mouth 2 (two) times daily.  240 mL  3  . Multiple Vitamin (MULTIVITAMIN WITH MINERALS) TABS Take 1 tablet by mouth daily.      . polyethylene glycol powder (MIRALAX) powder Take 17 g by mouth daily.  255 g  4  . capecitabine (XELODA) 150 MG tablet Take by mouth 2 (two) times daily after a meal.      . capecitabine (XELODA) 500 MG tablet Take 1 tablet (500 mg total) by mouth 2 (two) times daily after a meal. Take Monday - Friday with radiation  60 tablet  2  . hydrOXYzine (ATARAX/VISTARIL) 10 MG tablet Take 10 mg by mouth 2 (two) times daily as needed. For itching      . LORazepam (ATIVAN) 0.5 MG tablet Take 1 tablet (0.5 mg total) by mouth every 6 (six) hours as needed (Nausea or vomiting).  30 tablet  0  . ondansetron (ZOFRAN)  4 MG tablet Take 4 mg by mouth every 6 (six) hours as needed. For nausea      . ondansetron (ZOFRAN) 8 MG tablet Take 1 tablet (8 mg total) by mouth 2 (two) times daily as needed (Nausea or vomiting).  30 tablet  1  . prochlorperazine (COMPAZINE) 10 MG tablet Take 1 tablet (10 mg total) by mouth every 6 (six) hours as needed (Nausea or vomiting).  30 tablet  1  . prochlorperazine (COMPAZINE) 25 MG suppository Place 1 suppository (25 mg total) rectally every 12 (twelve) hours as needed for nausea.  12 suppository  3  .  zolpidem (AMBIEN) 5 MG tablet Take 1 tablet (5 mg total) by mouth at bedtime as needed for sleep.  30 tablet  0   No current facility-administered medications for this visit.   Facility-Administered Medications Ordered in Other Visits  Medication Dose Route Frequency Provider Last Rate Last Dose  . sodium chloride 0.9 % injection 10 mL  10 mL Intravenous PRN Victorino December, MD   10 mL at 10/30/12 1409    SURGICAL HISTORY:  Past Surgical History  Procedure Laterality Date  . Ercp  05/07/2012    Procedure: ENDOSCOPIC RETROGRADE CHOLANGIOPANCREATOGRAPHY (ERCP);  Surgeon: [name], MD;  Location: [facility];  Service: Endoscopy;  Laterality: N/A;  Dr [name]  . Eus  05/07/2012    Procedure: UPPER ENDOSCOPIC ULTRASOUND (EUS) RADIAL;  Surgeon: [name], MD;  Location: [facility];  Service: Endoscopy;;  . Appendectomy      open, 25 yrs ago  . Coronary stent placement      2 stents placed  . Whipple procedure  06/06/2012    Procedure: WHIPPLE PROCEDURE;  Surgeon: [name], MD;  Location: [facility] OR;  Service: General;  Laterality: N/A;  Pancreatico duodenectomy  . Cholecystectomy  06/06/2012    Procedure: CHOLECYSTECTOMY;  Surgeon: [name], MD;  Location: [facility] OR;  Service: General;  Laterality: N/A;  . Pancreatic stent placement  06/06/2012    Procedure: PANCREATIC STENT PLACEMENT;  Surgeon: [name], MD;  Location: [facility] OR;  Service: General;  Laterality: N/A;  Removal of biliary stent; Placement of pacreatic stent  . Lymph node dissection  06/06/2012    Procedure: LYMPH NODE DISSECTION;  Surgeon: [name], MD;  Location: [facility] OR;  Service: General;  Laterality: N/A;  Portal lymph node dissection  . Coronary angioplasty      8 or 9 years ago  . Portacath placement  07/24/2012    Procedure: INSERTION PORT-A-CATH;  Surgeon: [name], MD;  Location: [facility] OR;  Service: General;  Laterality: Left;    REVIEW OF SYSTEMS:   General: fatigue (+), night sweats (-), fever (-),  pain (-) Lymph: palpable nodes (-) HEENT: vision changes (-), mucositis (-), gum bleeding (-), epistaxis (-) Cardiovascular: chest pain (-), palpitations (-) Pulmonary: shortness of breath (-), dyspnea on exertion (-), cough (-), hemoptysis (-) GI:  Early satiety (-), melena (-), dysphagia (-), nausea/vomiting (-), diarrhea (-) GU: dysuria (-), hematuria (-), incontinence (-) Musculoskeletal: joint swelling (-), joint pain (-), back pain (-) Neuro: weakness (-), numbness (-), headache (-), confusion (-) Skin: Rash (-), lesions (-), dryness (-) Psych: depression (-), suicidal/homicidal ideation (-), feeling of hopelessness (-)   PHYSICAL EXAMINATION: Blood pressure 96/66, pulse 86, temperature 97.6 F (36.4 C), temperature source Oral, resp. rate 20, height 5\' 6"  (1.676 m), weight 136 lb 4.8 oz (61.825 kg). Body mass index is 22.01 kg/(m^2). General: Patient is a  well appearing male in no acute distress HEENT: PERRLA, sclerae anicteric no conjunctival pallor, MMM Neck: supple, no palpable adenopathy Lungs: clear to auscultation bilaterally, no wheezes, rhonchi, or rales Cardiovascular: tachycardic, regular rhythm, S1, S2, no murmurs, rubs or gallops Abdomen: Soft, non-distended, normoactive bowel sounds, no HSM, extremely tender to palpation, particularly the RLQ, with rebound.  Patient guarding abdomen when moving on exam table and grimacing while moving.  Extremities: warm and well perfused, no clubbing, cyanosis, or edema Skin: No rashes or lesions Neuro: Non-focal ECOG PERFORMANCE STATUS: 1 - Symptomatic but completely ambulatory   LABORATORY DATA: Lab Results  Component Value Date   WBC 6.5 10/30/2012   HGB 12.6* 10/30/2012   HCT 36.9* 10/30/2012   MCV 88.9 10/30/2012   PLT 114* 10/30/2012      Chemistry      Component Value Date/Time   NA 141 10/30/2012 1226   NA 134* 08/30/2012 0404   K 4.1 10/30/2012 1226   K 4.1 08/30/2012 0404   CL 108* 10/30/2012 1226   CL 106 08/30/2012  0404   CO2 25 10/30/2012 1226   CO2 17* 08/30/2012 0404   BUN 10.8 10/30/2012 1226   BUN 6 08/30/2012 0404   CREATININE 0.7 10/30/2012 1226   CREATININE 0.75 08/30/2012 0404      Component Value Date/Time   CALCIUM 9.2 10/30/2012 1226   CALCIUM 8.4 08/30/2012 0404   ALKPHOS 89 10/30/2012 1226   ALKPHOS 99 08/28/2012 0430   AST 16 10/30/2012 1226   AST 17 08/28/2012 0430   ALT 26 10/30/2012 1226   ALT 29 08/28/2012 0430   BILITOT 0.53 10/30/2012 1226   BILITOT 0.3 08/28/2012 0430       RADIOGRAPHIC STUDIES:    ASSESSMENT: 67 year old gentleman with  #1 periampullary carcinoma stage III patient is status post Whipple procedure overall she's done well with it. You will not receive adjuvant chemotherapy initially consisting of Gemzar for one cycle. He will then go on to receive concomitant chemoradiation with chemotherapy consisting of xeloda. Risks and benefits of treatment have been discussed with them completely.  #2 patient has now completed the initial chemotherapy of one cycle of Gemzar. Unfortunately his course was complicated by development of abdominal pain weakness and fatigue and therefore he was admitted to the hospital with a small bowel obstruction this is now resolved.  #3 patient is now ready to get started on radiation therapy I have recommended this to the patient as well as his wife and through the interpreter. Certainly he will need Xeloda concurrently with the radiation. However due to patient's comorbidity I do think that we will have to reduce the dose is a low.during radiation therapy. They do understand this. I will make a referral back to Dr. Antony Blackbird.  #4 patient is tolerating radiation therapy well. He has now received his xeloda.by mail. I have recommended his begin the Xeloda 500 mg in the morning 500 mg in the evening.  #5 patient is tolerating the Xeloda very well. We will proceed with increasing his dose 1000 mg in the morning 500 mg in the evening.  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   PLAN:    #1 patient will continue Xeloda concurrently with the radiation therapy. However I have increased his dose to 1000 mg in the morning and 1000 mg in the evening.  #2 once patient has completed her radiation therapy he will then begin adjuvant Gemzar starting on 11/20/2012. A total of  4 cycles of therapy is planned.  All questions were answered. The patient knows to call the clinic with any problems, questions or concerns. We can certainly see the patient much sooner if necessary.  I spent 25 minutes counseling the patient face to face. The total time spent in the appointment was 30 minutes.  Drue Second, MD Medical/Oncology [facility] [phone] (beeper) [phone] (Office)  09/08/2012, 11:09 AM

## 2012-11-12 ENCOUNTER — Ambulatory Visit (INDEPENDENT_AMBULATORY_CARE_PROVIDER_SITE_OTHER): Payer: Medicare Other | Admitting: General Surgery

## 2012-11-12 ENCOUNTER — Encounter (INDEPENDENT_AMBULATORY_CARE_PROVIDER_SITE_OTHER): Payer: Self-pay | Admitting: General Surgery

## 2012-11-12 VITALS — BP 132/68 | HR 72 | Temp 97.1°F | Resp 16 | Ht 66.0 in | Wt 140.0 lb

## 2012-11-12 DIAGNOSIS — K610 Anal abscess: Secondary | ICD-10-CM

## 2012-11-12 DIAGNOSIS — K612 Anorectal abscess: Secondary | ICD-10-CM

## 2012-11-12 NOTE — Patient Instructions (Signed)
Return to office as needed.

## 2012-11-12 NOTE — Progress Notes (Signed)
Colin Castro is a 67 y.o. male who is here for a follow up visit regarding his anterior perirectal abscess.  The area is healing well.  He denies pain or drainage.  Objective: Filed Vitals:   11/12/12 1357  BP: 132/68  Pulse: 72  Temp: 97.1 F (36.2 C)  Resp: 16    General appearance: alert, cooperative and no distress Perianal: no masses or drainage, no pain to palpation, no drainage   Assessment and Plan: S/P perirectal abscess drainage.  Area almost completely healed.  F/U PRN    .Vanita Panda, MD [facility], [place] [phone]

## 2012-11-14 ENCOUNTER — Telehealth: Payer: Self-pay | Admitting: Medical Oncology

## 2012-11-14 NOTE — Telephone Encounter (Signed)
Colin Castro @ [facility] pharmacy called inquiring whether pt is still taking Xeloda, and if it was stopped, reason why. Per MD, informed [facility] that pt completed radiation tx. All questions answered.  No further questions.

## 2012-11-20 ENCOUNTER — Ambulatory Visit (HOSPITAL_BASED_OUTPATIENT_CLINIC_OR_DEPARTMENT_OTHER): Payer: Medicaid Other

## 2012-11-20 ENCOUNTER — Other Ambulatory Visit (HOSPITAL_BASED_OUTPATIENT_CLINIC_OR_DEPARTMENT_OTHER): Payer: Medicare Other | Admitting: Lab

## 2012-11-20 ENCOUNTER — Ambulatory Visit (HOSPITAL_BASED_OUTPATIENT_CLINIC_OR_DEPARTMENT_OTHER): Payer: Medicare Other | Admitting: Oncology

## 2012-11-20 ENCOUNTER — Other Ambulatory Visit: Payer: Self-pay | Admitting: Medical Oncology

## 2012-11-20 VITALS — BP 104/69 | HR 64 | Temp 97.6°F | Resp 20 | Ht 66.0 in | Wt 138.7 lb

## 2012-11-20 DIAGNOSIS — Z5111 Encounter for antineoplastic chemotherapy: Secondary | ICD-10-CM

## 2012-11-20 DIAGNOSIS — C241 Malignant neoplasm of ampulla of Vater: Secondary | ICD-10-CM

## 2012-11-20 LAB — CBC WITH DIFFERENTIAL/PLATELET
Basophils Absolute: 0 10*3/uL (ref 0.0–0.1)
EOS%: 0.9 % (ref 0.0–7.0)
Eosinophils Absolute: 0.1 10*3/uL (ref 0.0–0.5)
HCT: 42.6 % (ref 38.4–49.9)
HGB: 14.6 g/dL (ref 13.0–17.1)
LYMPH%: 23.9 % (ref 14.0–49.0)
MCH: 30.9 pg (ref 27.2–33.4)
MCV: 90.1 fL (ref 79.3–98.0)
MONO%: 13 % (ref 0.0–14.0)
NEUT%: 62 % (ref 39.0–75.0)
Platelets: 127 10*3/uL — ABNORMAL LOW (ref 140–400)
RDW: 16.2 % — ABNORMAL HIGH (ref 11.0–14.6)

## 2012-11-20 LAB — COMPREHENSIVE METABOLIC PANEL (CC13)
Alkaline Phosphatase: 74 U/L (ref 40–150)
BUN: 15.6 mg/dL (ref 7.0–26.0)
CO2: 17 mEq/L — ABNORMAL LOW (ref 22–29)
Creatinine: 0.8 mg/dL (ref 0.7–1.3)
Glucose: 143 mg/dl — ABNORMAL HIGH (ref 70–99)
Total Bilirubin: 0.35 mg/dL (ref 0.20–1.20)

## 2012-11-20 MED ORDER — SODIUM CHLORIDE 0.9 % IV SOLN
1000.0000 mg/m2 | Freq: Once | INTRAVENOUS | Status: AC
  Administered 2012-11-20: 1710 mg via INTRAVENOUS
  Filled 2012-11-20: qty 45.03

## 2012-11-20 MED ORDER — PROCHLORPERAZINE MALEATE 10 MG PO TABS
10.0000 mg | ORAL_TABLET | Freq: Once | ORAL | Status: AC
  Administered 2012-11-20: 10 mg via ORAL

## 2012-11-20 MED ORDER — SODIUM CHLORIDE 0.9 % IV SOLN
Freq: Once | INTRAVENOUS | Status: AC
  Administered 2012-11-20: 12:00:00 via INTRAVENOUS

## 2012-11-20 MED ORDER — LIDOCAINE-PRILOCAINE 2.5-2.5 % EX CREA: TOPICAL_CREAM | CUTANEOUS | Status: AC | PRN

## 2012-11-20 MED ORDER — HEPARIN SOD (PORK) LOCK FLUSH 100 UNIT/ML IV SOLN
500.0000 [IU] | Freq: Once | INTRAVENOUS | Status: AC | PRN
  Administered 2012-11-20: 500 [IU]
  Filled 2012-11-20: qty 5

## 2012-11-20 MED ORDER — SODIUM CHLORIDE 0.9 % IJ SOLN
10.0000 mL | INTRAMUSCULAR | Status: DC | PRN
  Administered 2012-11-20: 10 mL
  Filled 2012-11-20: qty 10

## 2012-11-20 NOTE — Patient Instructions (Addendum)
[facility] Discharge Instructions for Patients Receiving Chemotherapy  Today you received the following chemotherapy agents: Gemzar  To help prevent nausea and vomiting after your treatment, we encourage you to take your nausea medication as directed by your MD. If you develop nausea and vomiting that is not controlled by your nausea medication, call the clinic. If it is after clinic hours your family physician or the after hours number for the clinic or go to the Emergency Department.   BELOW ARE SYMPTOMS THAT SHOULD BE REPORTED IMMEDIATELY:  *FEVER GREATER THAN 100.5 F  *CHILLS WITH OR WITHOUT FEVER  NAUSEA AND VOMITING THAT IS NOT CONTROLLED WITH YOUR NAUSEA MEDICATION  *UNUSUAL SHORTNESS OF BREATH  *UNUSUAL BRUISING OR BLEEDING  TENDERNESS IN MOUTH AND THROAT WITH OR WITHOUT PRESENCE OF ULCERS  *URINARY PROBLEMS  *BOWEL PROBLEMS  UNUSUAL RASH Items with * indicate a potential emergency and should be followed up as soon as possible.  Feel free to call the clinic you have any questions or concerns. The clinic phone number is [phone].    

## 2012-11-20 NOTE — Patient Instructions (Addendum)
Proceed with chemotherapy today (Gemzar)  I will see you back in 1 week

## 2012-11-24 ENCOUNTER — Encounter: Payer: Self-pay | Admitting: Radiation Oncology

## 2012-11-24 ENCOUNTER — Ambulatory Visit
Admission: RE | Admit: 2012-11-24 | Discharge: 2012-11-24 | Disposition: A | Discharge status: Home / Self Care | Payer: Medicare Other | Source: Ambulatory Visit | Attending: Radiation Oncology | Admitting: Radiation Oncology

## 2012-11-24 VITALS — BP 115/79 | HR 63 | Temp 97.3°F | Resp 18 | Wt 140.6 lb

## 2012-11-24 DIAGNOSIS — C241 Malignant neoplasm of ampulla of Vater: Secondary | ICD-10-CM

## 2012-11-24 NOTE — Progress Notes (Signed)
Radiation Oncology         [phone] ________________________________  Name: Colin Castro MRN: [id number]  Date: 11/24/2012  DOB: [birth year]  Follow-Up Visit Note  CC: [name], MD  Victorino December, MD  Diagnosis: Stage II-B adenocarcinoma of the ampulla   Interval Since Last Radiation:  4  weeks  Narrative:  The patient returns today for routine follow-up.  He is doing well at this time. He denies abdominal pain cough or breathing problems. His appetite continues to improve. He denies any further problems with constipation or diarrhea or the patient has started on Gemzar                              ALLERGIES:  has No Known Allergies.  Meds: Current Outpatient Prescriptions  Medication Sig Dispense Refill  . acetaminophen (TYLENOL) 500 MG tablet Take 500 mg by mouth every 6 (six) hours as needed. For pain      . capecitabine (XELODA) 150 MG tablet Take by mouth 2 (two) times daily after a meal.      . capecitabine (XELODA) 500 MG tablet Take 1 tablet (500 mg total) by mouth 2 (two) times daily after a meal. Take Monday - Friday with radiation  60 tablet  2  . hydrOXYzine (ATARAX/VISTARIL) 10 MG tablet Take 10 mg by mouth 2 (two) times daily as needed. For itching      . lidocaine-prilocaine (EMLA) cream Apply topically as needed. Apply to port site 1-2 hours prior to chemotherapy appt.  30 g  3  . lipase/protease/amylase (CREON) 12000 UNITS CPEP Take 2 capsules by mouth 3 (three) times daily before meals.  180 capsule  12  . metoprolol tartrate (LOPRESSOR) 25 MG tablet       . Multiple Vitamin (MULTIVITAMIN WITH MINERALS) TABS Take 1 tablet by mouth daily.      . ondansetron (ZOFRAN) 4 MG tablet Take 4 mg by mouth every 6 (six) hours as needed. For nausea      . polyethylene glycol powder (MIRALAX) powder Take 17 g by mouth daily.  255 g  4  . pravastatin (PRAVACHOL) 40 MG tablet       . zolpidem (AMBIEN) 5 MG tablet Take 1 tablet (5 mg total) by mouth at bedtime as needed  for sleep.  30 tablet  0  . megestrol (MEGACE) 400 MG/10ML suspension Take 10 mLs (400 mg total) by mouth 2 (two) times daily.  240 mL  3   No current facility-administered medications for this encounter.   Facility-Administered Medications Ordered in Other Encounters  Medication Dose Route Frequency Provider Last Rate Last Dose  . sodium chloride 0.9 % injection 10 mL  10 mL Intravenous PRN Victorino December, MD   10 mL at 10/30/12 1409    Physical Findings: The patient is in no acute distress. Patient is alert and oriented.  weight is 140 lb 9.6 oz (63.776 kg). His oral temperature is 97.3 F (36.3 C). His blood pressure is 115/79 and his pulse is 63. His respiration is 18 and oxygen saturation is 99%. .  No palpable supraclavicular or axillary adenopathy.  The lungs are clear to auscultation. The heart has a regular rhythm and rate. Abdomen is soft and nontender with normal bowel sounds. The patient's laparotomy scar is well-healed without signs of drainage or infection.  Lab Findings: Lab Results  Component Value Date   WBC 6.5 11/20/2012   HGB  14.6 11/20/2012   HCT 42.6 11/20/2012   MCV 90.1 11/20/2012   PLT 127* 11/20/2012     Radiographic Findings: No results found.  Impression:  The patient is recovering from the effects of radiation.  Adjuvant chemotherapy as above  Plan:  When necessary followup in radiation oncology.  The patient will continue close followup in medical oncology with treatments as above.  _____________________________________  -----------------------------------  Billie Lade, PhD, MD

## 2012-11-24 NOTE — Progress Notes (Signed)
Patient presents to the clinic today accompanied by his wife and the translator for a follow up appointment with Dr. Roselind Messier. Patient is alert and oriented to person, place, and time. No distress noted. Steady gait noted. Pleasant affect noted. Patient denies pain at this time. Patient denies nausea, vomiting, headache, dizziness or diarrhea. Patient reports that he had a normal bowel movement this morning. Patient reports a good appetite, he has gained 3 pounds since completion of XRT and no longer has to take megace to stimulate appetite. Patient reports only occasional difficulty sleeping but, reports his energy level has returned to normal. Reported all findings to Dr. Roselind Messier. Patient scheduled to see Dr. Welton Flakes tomorrow.

## 2012-11-27 ENCOUNTER — Ambulatory Visit (HOSPITAL_BASED_OUTPATIENT_CLINIC_OR_DEPARTMENT_OTHER): Payer: Medicare Other | Admitting: Oncology

## 2012-11-27 ENCOUNTER — Ambulatory Visit (HOSPITAL_BASED_OUTPATIENT_CLINIC_OR_DEPARTMENT_OTHER): Payer: Medicaid Other

## 2012-11-27 ENCOUNTER — Other Ambulatory Visit (HOSPITAL_BASED_OUTPATIENT_CLINIC_OR_DEPARTMENT_OTHER): Payer: Medicare Other | Admitting: Lab

## 2012-11-27 VITALS — BP 99/70 | HR 66 | Temp 98.2°F | Resp 20 | Ht 66.0 in | Wt 139.3 lb

## 2012-11-27 DIAGNOSIS — D696 Thrombocytopenia, unspecified: Secondary | ICD-10-CM

## 2012-11-27 DIAGNOSIS — Z5111 Encounter for antineoplastic chemotherapy: Secondary | ICD-10-CM

## 2012-11-27 DIAGNOSIS — E860 Dehydration: Secondary | ICD-10-CM

## 2012-11-27 DIAGNOSIS — C241 Malignant neoplasm of ampulla of Vater: Secondary | ICD-10-CM

## 2012-11-27 LAB — CBC WITH DIFFERENTIAL/PLATELET
BASO%: 0.3 % (ref 0.0–2.0)
Basophils Absolute: 0 10*3/uL (ref 0.0–0.1)
EOS%: 0.9 % (ref 0.0–7.0)
MCH: 30.6 pg (ref 27.2–33.4)
MCHC: 34.4 g/dL (ref 32.0–36.0)
MCV: 89 fL (ref 79.3–98.0)
MONO%: 11.5 % (ref 0.0–14.0)
RBC: 4.28 10*6/uL (ref 4.20–5.82)
RDW: 15.3 % — ABNORMAL HIGH (ref 11.0–14.6)
lymph#: 1.4 10*3/uL (ref 0.9–3.3)

## 2012-11-27 LAB — COMPREHENSIVE METABOLIC PANEL (CC13)
ALT: 29 U/L (ref 0–55)
AST: 20 U/L (ref 5–34)
Albumin: 3.3 g/dL — ABNORMAL LOW (ref 3.5–5.0)
Alkaline Phosphatase: 76 U/L (ref 40–150)
BUN: 15.6 mg/dL (ref 7.0–26.0)
Chloride: 110 mEq/L — ABNORMAL HIGH (ref 98–107)
Potassium: 3.8 mEq/L (ref 3.5–5.1)

## 2012-11-27 MED ORDER — SODIUM CHLORIDE 0.9 % IV SOLN
1000.0000 mL | Freq: Once | INTRAVENOUS | Status: AC
  Administered 2012-11-27: 1000 mL via INTRAVENOUS

## 2012-11-27 MED ORDER — SODIUM CHLORIDE 0.9 % IV SOLN
800.0000 mg/m2 | Freq: Once | INTRAVENOUS | Status: AC
  Administered 2012-11-27: 1368 mg via INTRAVENOUS
  Filled 2012-11-27: qty 36

## 2012-11-27 MED ORDER — HEPARIN SOD (PORK) LOCK FLUSH 100 UNIT/ML IV SOLN
500.0000 [IU] | Freq: Once | INTRAVENOUS | Status: AC | PRN
  Administered 2012-11-27: 500 [IU]
  Filled 2012-11-27: qty 5

## 2012-11-27 MED ORDER — SODIUM CHLORIDE 0.9 % IV SOLN: Freq: Once | INTRAVENOUS | Status: AC

## 2012-11-27 MED ORDER — SODIUM CHLORIDE 0.9 % IJ SOLN
10.0000 mL | INTRAMUSCULAR | Status: DC | PRN
  Administered 2012-11-27: 10 mL
  Filled 2012-11-27: qty 10

## 2012-11-27 MED ORDER — PROCHLORPERAZINE MALEATE 10 MG PO TABS
10.0000 mg | ORAL_TABLET | Freq: Once | ORAL | Status: AC
  Administered 2012-11-27: 10 mg via ORAL

## 2012-11-27 NOTE — Patient Instructions (Signed)
[facility] Discharge Instructions for Patients Receiving Chemotherapy  Today you received the following chemotherapy agents Gemzar  To help prevent nausea and vomiting after your treatment, we encourage you to take your nausea medication as prescribed. If you develop nausea and vomiting that is not controlled by your nausea medication, call the clinic. If it is after clinic hours your family physician or the after hours number for the clinic or go to the Emergency Department.   BELOW ARE SYMPTOMS THAT SHOULD BE REPORTED IMMEDIATELY:  *FEVER GREATER THAN 100.5 F  *CHILLS WITH OR WITHOUT FEVER  NAUSEA AND VOMITING THAT IS NOT CONTROLLED WITH YOUR NAUSEA MEDICATION  *UNUSUAL SHORTNESS OF BREATH  *UNUSUAL BRUISING OR BLEEDING  TENDERNESS IN MOUTH AND THROAT WITH OR WITHOUT PRESENCE OF ULCERS  *URINARY PROBLEMS  *BOWEL PROBLEMS  UNUSUAL RASH Items with * indicate a potential emergency and should be followed up as soon as possible.  One of the nurses will contact you 24 hours after your treatment. Please let the nurse know about any problems that you may have experienced. Feel free to call the clinic you have any questions or concerns. The clinic phone number is [phone].   I have been informed and understand all the instructions given to me. I know to contact the clinic, my physician, or go to the Emergency Department if any problems should occur. I do not have any questions at this time, but understand that I may call the clinic during office hours   should I have any questions or need assistance in obtaining follow up care.    __________________________________________  _____________  __________ Signature of Patient or Authorized Representative            Date                   Time    __________________________________________ Nurse's Signature    

## 2012-11-27 NOTE — Progress Notes (Signed)
Ok to treat with platelets 74 per Dr. Welton Flakes

## 2012-11-27 NOTE — Patient Instructions (Addendum)
Proceed with gemzar today  You will return in 4/24 for next chemo and follow up

## 2012-11-27 NOTE — Progress Notes (Signed)
OFFICE PROGRESS NOTE  CC  Colin Rodriguez, MD [address] 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 He underwent pancreatic code duodenectomy. His final pathology revealed a well to moderately differentiated adenocarcinoma the tumor arose from high grade dysplasia involving small bowel mucosa in the ampulla invaded into the pancreas margins. The margins were negative LV I was noted one lymph node was positive for metastatic disease additional 14 lymph nodes were negative. Portal lymph node resection revealed 5 benign lymph nodes. Pathologically T3 N1.  #2 patient completed adjuvant Gemzar for one cycle prior to start of radiation therapy he tolerated it well.   #3 he completed adjuvant radiation with radiosensitizing Xeloda on 10/31/2012. He tolerated the dose well  #4 began adjuvant single agent Gemzar 3 weeks on one week off beginning 11/20/2012. We'll plan on giving him a total of 3 cycles.  CURRENT THERAPY: Gemzar week 2/4 INTERVAL HISTORY: Colin Castro 67 y.o. male returns for followup visit he is accompanied by his wife and interpreter. Patient gets very anxious and nervous when he waits too long. Unfortunately he did have to wait about an hour today due to scheduling issues. He is denying any nausea vomiting fevers chills. He has gained some weight he has been in very well at home. He has no myalgias and arthralgias no mucositis. No diarrhea or constipation. Remainder of the 10 point review of systems is negative.  MEDICAL HISTORY:  Past Medical History  Diagnosis Date  . Hypertension   . High cholesterol   . Coronary artery disease   . Common bile duct (CBD) stricture     Malignant  . Cancer 06/06/12 bx    stage III  ampullary invasive well to mod diff adenocarcinoma,invades iinto pancreas  . Pneumonia   . Constipation   . Myocardial infarction     15 years ago    ALLERGIES:  has No Known Allergies.  MEDICATIONS:  Current Outpatient Prescriptions  Medication Sig Dispense Refill  . acetaminophen (TYLENOL) 500 MG tablet Take 500 mg by mouth every 6 (six) hours as needed. For pain      . hydrOXYzine (ATARAX/VISTARIL) 10 MG tablet Take 10 mg by mouth 2 (two) times daily as needed. For itching      . lidocaine-prilocaine (EMLA) cream Apply topically as needed. Apply to port site 1-2 hours prior to chemotherapy appt.  30 g  3  . lipase/protease/amylase (CREON) 12000 UNITS CPEP Take 2 capsules by mouth 3 (three) times daily before meals.  180 capsule  12  . megestrol (MEGACE) 400 MG/10ML suspension Take 10 mLs (400 mg total) by mouth 2 (two) times daily.  240 mL  3  . metoprolol tartrate (LOPRESSOR) 25 MG tablet       . Multiple Vitamin (MULTIVITAMIN WITH MINERALS) TABS Take 1 tablet by mouth daily.      . ondansetron (ZOFRAN) 4 MG tablet Take 4 mg by mouth every 6 (six) hours as needed. For nausea      . polyethylene glycol powder (MIRALAX) powder Take 17 g by mouth daily.  255 g  4  . pravastatin (PRAVACHOL) 40 MG tablet       . zolpidem (AMBIEN) 5 MG tablet Take 1 tablet (5 mg total) by mouth at bedtime as needed for sleep.  30 tablet  0  . capecitabine (XELODA) 150 MG tablet Take by mouth 2 (two) times daily after a meal.      . capecitabine (XELODA) 500 MG tablet Take 1 tablet (500 mg total) by mouth 2 (two) times daily after a meal. Take Monday - Friday with radiation  60 tablet  2   No current facility-administered medications for this visit.   Facility-Administered Medications Ordered in Other Visits  Medication Dose Route Frequency Provider Last Rate Last Dose  . sodium chloride 0.9 % injection 10 mL  10 mL Intravenous PRN Victorino December, MD   10 mL at 10/30/12 1409  . sodium chloride 0.9 %  injection 10 mL  10 mL Intracatheter PRN Victorino December, MD   10 mL at 11/27/12 1602    SURGICAL HISTORY:  Past Surgical History  Procedure Laterality Date  . Ercp  05/07/2012    Procedure: ENDOSCOPIC RETROGRADE CHOLANGIOPANCREATOGRAPHY (ERCP);  Surgeon: [name], MD;  Location: [facility];  Service: Endoscopy;  Laterality: N/A;  Dr [name]  . Eus  05/07/2012    Procedure: UPPER ENDOSCOPIC ULTRASOUND (EUS) RADIAL;  Surgeon: [name], MD;  Location: [facility];  Service: Endoscopy;;  . Appendectomy      open, 25 yrs ago  . Coronary stent placement      2 stents placed  . Whipple procedure  06/06/2012    Procedure: WHIPPLE PROCEDURE;  Surgeon: [name], MD;  Location: [facility] OR;  Service: General;  Laterality: N/A;  Pancreatico duodenectomy  . Cholecystectomy  06/06/2012    Procedure: CHOLECYSTECTOMY;  Surgeon: [name], MD;  Location: [facility] OR;  Service: General;  Laterality: N/A;  . Pancreatic stent placement  06/06/2012    Procedure: PANCREATIC STENT PLACEMENT;  Surgeon: [name], MD;  Location: [facility] OR;  Service: General;  Laterality: N/A;  Removal of biliary stent; Placement of pacreatic stent  . Lymph node dissection  06/06/2012    Procedure: LYMPH NODE DISSECTION;  Surgeon: [name], MD;  Location: [facility] OR;  Service: General;  Laterality: N/A;  Portal lymph node dissection  . Coronary angioplasty      8 or 9 years ago  . Portacath placement  07/24/2012    Procedure: INSERTION PORT-A-CATH;  Surgeon: [name], MD;  Location: [facility] OR;  Service: General;  Laterality: Left;    REVIEW OF SYSTEMS:   General: fatigue (+), night sweats (-), fever (-), pain (-) Lymph: palpable nodes (-) HEENT: vision changes (-), mucositis (-), gum bleeding (-), epistaxis (-) Cardiovascular: chest pain (-), palpitations (-) Pulmonary: shortness of breath (-), dyspnea on exertion (-), cough (-), hemoptysis (-) GI:  Early satiety (-), melena (-), dysphagia (-), nausea/vomiting (-),  diarrhea (-) GU: dysuria (-), hematuria (-), incontinence (-) Musculoskeletal: joint swelling (-), joint pain (-), back pain (-) Neuro: weakness (-), numbness (-), headache (-), confusion (-) Skin: Rash (-), lesions (-), dryness (-) Psych: depression (-), suicidal/homicidal ideation (-), feeling of hopelessness (-)   PHYSICAL EXAMINATION: Blood pressure 99/70, pulse 66, temperature 98.2 F (36.8 C), temperature source Oral, resp. rate 20, height 5\' 6"  (1.676 m), weight 139 lb 4.8 oz (63.186 kg). Body mass index is 22.49 kg/(m^2). General: Patient is a well appearing male in no acute distress HEENT: PERRLA, sclerae anicteric no conjunctival pallor, MMM Neck: supple, no palpable adenopathy Lungs: clear to auscultation bilaterally, no wheezes, rhonchi, or rales Cardiovascular: tachycardic, regular rhythm, S1, S2, no murmurs, rubs or gallops Abdomen: Soft, non-distended, normoactive bowel sounds, no HSM, extremely tender  to palpation, particularly the RLQ, with rebound.  Patient guarding abdomen when moving on exam table and grimacing while moving.  Extremities: warm and well perfused, no clubbing, cyanosis, or edema Skin: No rashes or lesions Neuro: Non-focal  ECOG PERFORMANCE STATUS: 1 - Symptomatic but completely ambulatory   LABORATORY DATA: Lab Results  Component Value Date   WBC 3.5* 11/27/2012   HGB 13.1 11/27/2012   HCT 38.1* 11/27/2012   MCV 89.0 11/27/2012   PLT 74* 11/27/2012      Chemistry      Component Value Date/Time   NA 138 11/27/2012 1132   NA 134* 08/30/2012 0404   K 3.8 11/27/2012 1132   K 4.1 08/30/2012 0404   CL 110* 11/27/2012 1132   CL 106 08/30/2012 0404   CO2 20* 11/27/2012 1132   CO2 17* 08/30/2012 0404   BUN 15.6 11/27/2012 1132   BUN 6 08/30/2012 0404   CREATININE 0.7 11/27/2012 1132   CREATININE 0.75 08/30/2012 0404      Component Value Date/Time   CALCIUM 9.0 11/27/2012 1132   CALCIUM 8.4 08/30/2012 0404   ALKPHOS 76 11/27/2012 1132   ALKPHOS 99 08/28/2012  0430   AST 20 11/27/2012 1132   AST 17 08/28/2012 0430   ALT 29 11/27/2012 1132   ALT 29 08/28/2012 0430   BILITOT 0.30 11/27/2012 1132   BILITOT 0.3 08/28/2012 0430       RADIOGRAPHIC STUDIES:    ASSESSMENT: 67 year old gentleman with  #1 periampullary carcinoma stage III patient is status post Whipple procedure overall she's done well with it. You will not receive adjuvant chemotherapy initially consisting of Gemzar for one cycle. He will then go on to receive concomitant chemoradiation with chemotherapy consisting of xeloda. Risks and benefits of treatment have been discussed with them completely.  #2 patient has now completed the initial chemotherapy of one cycle of Gemzar. Unfortunately his course was complicated by development of abdominal pain weakness and fatigue and therefore he was admitted to the hospital with a small bowel obstruction this is now resolved.  #3 patient has completed concurrent radiation therapy and Xeloda. Overall he tolerated it well. He started the radiation 09/24/2012 through 10/31/2012. Concurrently he did receive escalating doses of Xeloda but maximal dose of we got into him was 1500 mg daily Monday through Friday.  #4 patient began adjuvant Gemzar on 11/20/2012. His dosing was thousand milligrams per meter squared. He tolerated it well. He is now here for cycle 2 of Gemzar only. However his platelets are low and I have dose reduced to 800 mg per meter squared.   PLAN:  #1 patient will proceed with scheduled Gemzar. This is dose reduced.  #2 he will return in 2 weeks' time for next scheduled cycle of treatment..  All questions were answered. The patient knows to call the clinic with any problems, questions or concerns. We can certainly see the patient much sooner if necessary.  I spent 25 minutes counseling the patient face to face. The total time spent in the appointment was 30 minutes.  Drue Second, MD Medical/Oncology [facility] [phone] (beeper) [phone] (Office)  09/08/2012, 11:09 AM

## 2012-12-11 ENCOUNTER — Telehealth: Payer: Self-pay | Admitting: Oncology

## 2012-12-11 ENCOUNTER — Encounter: Payer: Self-pay | Admitting: Oncology

## 2012-12-11 ENCOUNTER — Telehealth: Payer: Self-pay | Admitting: *Deleted

## 2012-12-11 ENCOUNTER — Ambulatory Visit (HOSPITAL_BASED_OUTPATIENT_CLINIC_OR_DEPARTMENT_OTHER): Payer: Medicare Other | Admitting: Oncology

## 2012-12-11 ENCOUNTER — Other Ambulatory Visit (HOSPITAL_BASED_OUTPATIENT_CLINIC_OR_DEPARTMENT_OTHER): Payer: Medicare Other | Admitting: Lab

## 2012-12-11 ENCOUNTER — Ambulatory Visit (HOSPITAL_BASED_OUTPATIENT_CLINIC_OR_DEPARTMENT_OTHER): Payer: Medicaid Other

## 2012-12-11 VITALS — BP 93/62 | HR 63 | Temp 94.9°F | Resp 20 | Ht 66.0 in | Wt 142.4 lb

## 2012-12-11 DIAGNOSIS — C241 Malignant neoplasm of ampulla of Vater: Secondary | ICD-10-CM

## 2012-12-11 DIAGNOSIS — Z5111 Encounter for antineoplastic chemotherapy: Secondary | ICD-10-CM

## 2012-12-11 LAB — COMPREHENSIVE METABOLIC PANEL (CC13)
ALT: 42 U/L (ref 0–55)
BUN: 12.2 mg/dL (ref 7.0–26.0)
CO2: 21 mEq/L — ABNORMAL LOW (ref 22–29)
Calcium: 8.9 mg/dL (ref 8.4–10.4)
Chloride: 112 mEq/L — ABNORMAL HIGH (ref 98–107)
Creatinine: 0.8 mg/dL (ref 0.7–1.3)
Glucose: 91 mg/dl (ref 70–99)
Total Bilirubin: 0.22 mg/dL (ref 0.20–1.20)

## 2012-12-11 LAB — CBC WITH DIFFERENTIAL/PLATELET
Eosinophils Absolute: 0.1 10*3/uL (ref 0.0–0.5)
HCT: 38.7 % (ref 38.4–49.9)
LYMPH%: 26.6 % (ref 14.0–49.0)
MCV: 88.8 fL (ref 79.3–98.0)
MONO%: 18.9 % — ABNORMAL HIGH (ref 0.0–14.0)
NEUT#: 2.9 10*3/uL (ref 1.5–6.5)
NEUT%: 52.9 % (ref 39.0–75.0)
Platelets: 229 10*3/uL (ref 140–400)
RBC: 4.36 10*6/uL (ref 4.20–5.82)
nRBC: 0 % (ref 0–0)

## 2012-12-11 MED ORDER — SODIUM CHLORIDE 0.9 % IV SOLN
1000.0000 mg/m2 | Freq: Once | INTRAVENOUS | Status: AC
  Administered 2012-12-11: 1710 mg via INTRAVENOUS
  Filled 2012-12-11: qty 45

## 2012-12-11 MED ORDER — PROCHLORPERAZINE MALEATE 10 MG PO TABS
10.0000 mg | ORAL_TABLET | Freq: Once | ORAL | Status: AC
  Administered 2012-12-11: 10 mg via ORAL

## 2012-12-11 MED ORDER — SODIUM CHLORIDE 0.9 % IJ SOLN
10.0000 mL | INTRAMUSCULAR | Status: DC | PRN
  Administered 2012-12-11: 10 mL
  Filled 2012-12-11: qty 10

## 2012-12-11 MED ORDER — HEPARIN SOD (PORK) LOCK FLUSH 100 UNIT/ML IV SOLN
500.0000 [IU] | Freq: Once | INTRAVENOUS | Status: AC | PRN
  Administered 2012-12-11: 500 [IU]
  Filled 2012-12-11: qty 5

## 2012-12-11 MED ORDER — SODIUM CHLORIDE 0.9 % IV SOLN
Freq: Once | INTRAVENOUS | Status: AC
  Administered 2012-12-11: 14:00:00 via INTRAVENOUS

## 2012-12-11 NOTE — Patient Instructions (Addendum)
Proceed with chemotherapy today

## 2012-12-11 NOTE — Telephone Encounter (Signed)
Per staff message and POF I have scheduled appts.  JMW  

## 2012-12-11 NOTE — Patient Instructions (Addendum)
[facility] Discharge Instructions for Patients Receiving Chemotherapy  Today you received the following chemotherapy agents Gemzar  To help prevent nausea and vomiting after your treatment, we encourage you to take your nausea medication as prescribed.   If you develop nausea and vomiting that is not controlled by your nausea medication, call the clinic. If it is after clinic hours your family physician or the after hours number for the clinic or go to the Emergency Department.   BELOW ARE SYMPTOMS THAT SHOULD BE REPORTED IMMEDIATELY:  *FEVER GREATER THAN 100.5 F  *CHILLS WITH OR WITHOUT FEVER  NAUSEA AND VOMITING THAT IS NOT CONTROLLED WITH YOUR NAUSEA MEDICATION  *UNUSUAL SHORTNESS OF BREATH  *UNUSUAL BRUISING OR BLEEDING  TENDERNESS IN MOUTH AND THROAT WITH OR WITHOUT PRESENCE OF ULCERS  *URINARY PROBLEMS  *BOWEL PROBLEMS  UNUSUAL RASH Items with * indicate a potential emergency and should be followed up as soon as possible.  Feel free to call the clinic you have any questions or concerns. The clinic phone number is [phone].   I have been informed and understand all the instructions given to me. I know to contact the clinic, my physician, or go to the Emergency Department if any problems should occur. I do not have any questions at this time, but understand that I may call the clinic during office hours   should I have any questions or need assistance in obtaining follow up care.    __________________________________________  _____________  __________ Signature of Patient or Authorized Representative            Date                   Time    __________________________________________ Nurse's Signature    

## 2012-12-11 NOTE — Progress Notes (Signed)
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 He underwent pancreatic code duodenectomy. His final pathology revealed a well to moderately differentiated adenocarcinoma the tumor arose from high grade dysplasia involving small bowel mucosa in the ampulla invaded into the pancreas margins. The margins were negative LV I was noted one lymph node was positive for metastatic disease additional 14 lymph nodes were negative. Portal lymph node resection revealed 5 benign lymph nodes. Pathologically T3 N1.  #2 patient completed adjuvant Gemzar for one cycle prior to start of radiation therapy he tolerated it well.   #3 he completed adjuvant radiation with radiosensitizing Xeloda on 10/31/2012. He tolerated the dose well  #4 began adjuvant single agent Gemzar 3 weeks on one week off beginning 11/20/2012. We'll plan on giving him a total of 3 cycles.  CURRENT THERAPY: Gemzar week 1/3  INTERVAL HISTORY: [name] 67 y.o. male returns for followup visit he is accompanied by his wife and interpreter. Patient gets very anxious and nervous when he waits too long. Unfortunately he did have to wait about an hour today due to scheduling issues. He is denying any nausea vomiting fevers chills. He has gained some weight he has been in very well at home. He has no myalgias and arthralgias no mucositis. No diarrhea or constipation. Remainder of the 10 point review of systems is negative.  MEDICAL HISTORY:  Past Medical History  Diagnosis Date  . Hypertension   . High cholesterol   . Coronary artery disease   . Common bile duct (CBD) stricture     Malignant  . Cancer 06/06/12 bx    stage III  ampullary invasive well to mod diff adenocarcinoma,invades iinto pancreas  . Pneumonia   . Constipation   . Myocardial infarction     15 years ago    ALLERGIES:  has No Known Allergies.  MEDICATIONS:  Current Outpatient Prescriptions  Medication Sig Dispense Refill  . acetaminophen (TYLENOL) 500 MG tablet Take 500 mg by mouth every 6 (six) hours as needed. For pain      . lidocaine-prilocaine (EMLA) cream Apply topically as needed. Apply to port site 1-2 hours prior to chemotherapy appt.  30 g  3  . lipase/protease/amylase (CREON) 12000 UNITS CPEP Take 2 capsules by mouth 3 (three) times daily before meals.  180 capsule  12  . metoprolol tartrate (LOPRESSOR) 25 MG tablet       . Multiple Vitamin (MULTIVITAMIN WITH MINERALS) TABS Take 1 tablet by mouth daily.      . ondansetron (ZOFRAN) 4 MG tablet Take 4 mg by mouth every 6 (six) hours as needed. For nausea      . zolpidem (AMBIEN) 5 MG tablet Take 1 tablet (5 mg total) by mouth at bedtime as needed for sleep.  30 tablet  0  . capecitabine (XELODA) 150 MG tablet Take by mouth 2 (two) times daily after a meal.      . capecitabine (XELODA) 500 MG tablet Take 1 tablet (500 mg total) by mouth 2 (two) times daily after a meal. Take Monday - Friday with radiation  60 tablet  2  . hydrOXYzine (ATARAX/VISTARIL) 10 MG tablet Take 10 mg by mouth 2 (two) times daily as needed. For itching      .  megestrol (MEGACE) 400 MG/10ML suspension Take 10 mLs (400 mg total) by mouth 2 (two) times daily.  240 mL  3  . polyethylene glycol powder (MIRALAX) powder Take 17 g by mouth daily.  255 g  4  . pravastatin (PRAVACHOL) 40 MG tablet        No current facility-administered medications for this visit.   Facility-Administered Medications Ordered in Other Visits  Medication Dose Route Frequency Provider Last Rate Last Dose  . sodium chloride 0.9 % injection 10 mL  10 mL Intravenous PRN Victorino December, MD   10 mL at 10/30/12 1409  . sodium chloride 0.9 %  injection 10 mL  10 mL Intracatheter PRN Victorino December, MD   10 mL at 12/11/12 1544    SURGICAL HISTORY:  Past Surgical History  Procedure Laterality Date  . Ercp  05/07/2012    Procedure: ENDOSCOPIC RETROGRADE CHOLANGIOPANCREATOGRAPHY (ERCP);  Surgeon: [name], MD;  Location: [facility];  Service: Endoscopy;  Laterality: N/A;  Dr [name]  . Eus  05/07/2012    Procedure: UPPER ENDOSCOPIC ULTRASOUND (EUS) RADIAL;  Surgeon: [name], MD;  Location: [facility];  Service: Endoscopy;;  . Appendectomy      open, 25 yrs ago  . Coronary stent placement      2 stents placed  . Whipple procedure  06/06/2012    Procedure: WHIPPLE PROCEDURE;  Surgeon: [name], MD;  Location: [facility] OR;  Service: General;  Laterality: N/A;  Pancreatico duodenectomy  . Cholecystectomy  06/06/2012    Procedure: CHOLECYSTECTOMY;  Surgeon: [name], MD;  Location: [facility] OR;  Service: General;  Laterality: N/A;  . Pancreatic stent placement  06/06/2012    Procedure: PANCREATIC STENT PLACEMENT;  Surgeon: [name], MD;  Location: [facility] OR;  Service: General;  Laterality: N/A;  Removal of biliary stent; Placement of pacreatic stent  . Lymph node dissection  06/06/2012    Procedure: LYMPH NODE DISSECTION;  Surgeon: [name], MD;  Location: [facility] OR;  Service: General;  Laterality: N/A;  Portal lymph node dissection  . Coronary angioplasty      8 or 9 years ago  . Portacath placement  07/24/2012    Procedure: INSERTION PORT-A-CATH;  Surgeon: [name], MD;  Location: [facility] OR;  Service: General;  Laterality: Left;    REVIEW OF SYSTEMS:   General: fatigue (+), night sweats (-), fever (-), pain (-) Lymph: palpable nodes (-) HEENT: vision changes (-), mucositis (-), gum bleeding (-), epistaxis (-) Cardiovascular: chest pain (-), palpitations (-) Pulmonary: shortness of breath (-), dyspnea on exertion (-), cough (-), hemoptysis (-) GI:  Early satiety (-), melena (-), dysphagia (-), nausea/vomiting (-),  diarrhea (-) GU: dysuria (-), hematuria (-), incontinence (-) Musculoskeletal: joint swelling (-), joint pain (-), back pain (-) Neuro: weakness (-), numbness (-), headache (-), confusion (-) Skin: Rash (-), lesions (-), dryness (-) Psych: depression (-), suicidal/homicidal ideation (-), feeling of hopelessness (-)   PHYSICAL EXAMINATION: Blood pressure 93/62, pulse 63, temperature 94.9 F (34.9 C), temperature source Oral, resp. rate 20, height 5\' 6"  (1.676 m), weight 142 lb 6.4 oz (64.592 kg). Body mass index is 22.99 kg/(m^2). General: Patient is a well appearing male in no acute distress HEENT: PERRLA, sclerae anicteric no conjunctival pallor, MMM Neck: supple, no palpable adenopathy Lungs: clear to auscultation bilaterally, no wheezes, rhonchi, or rales Cardiovascular: tachycardic, regular rhythm, S1, S2, no murmurs, rubs or gallops Abdomen: Soft, non-distended, normoactive bowel sounds, no HSM, extremely tender to palpation, particularly  the RLQ, with rebound.  Patient guarding abdomen when moving on exam table and grimacing while moving.  Extremities: warm and well perfused, no clubbing, cyanosis, or edema Skin: No rashes or lesions Neuro: Non-focal  ECOG PERFORMANCE STATUS: 1 - Symptomatic but completely ambulatory   LABORATORY DATA: Lab Results  Component Value Date   WBC 5.6 12/11/2012   HGB 13.3 12/11/2012   HCT 38.7 12/11/2012   MCV 88.8 12/11/2012   PLT 229 12/11/2012      Chemistry      Component Value Date/Time   NA 140 12/11/2012 1206   NA 134* 08/30/2012 0404   K 4.2 12/11/2012 1206   K 4.1 08/30/2012 0404   CL 112* 12/11/2012 1206   CL 106 08/30/2012 0404   CO2 21* 12/11/2012 1206   CO2 17* 08/30/2012 0404   BUN 12.2 12/11/2012 1206   BUN 6 08/30/2012 0404   CREATININE 0.8 12/11/2012 1206   CREATININE 0.75 08/30/2012 0404      Component Value Date/Time   CALCIUM 8.9 12/11/2012 1206   CALCIUM 8.4 08/30/2012 0404   ALKPHOS 94 12/11/2012 1206   ALKPHOS 99 08/28/2012  0430   AST 24 12/11/2012 1206   AST 17 08/28/2012 0430   ALT 42 12/11/2012 1206   ALT 29 08/28/2012 0430   BILITOT 0.22 12/11/2012 1206   BILITOT 0.3 08/28/2012 0430       RADIOGRAPHIC STUDIES:    ASSESSMENT: 67 year old gentleman with  #1 periampullary carcinoma stage III patient is status post Whipple procedure overall she's done well with it. You will not receive adjuvant chemotherapy initially consisting of Gemzar for one cycle. He will then go on to receive concomitant chemoradiation with chemotherapy consisting of xeloda. Risks and benefits of treatment have been discussed with them completely.  #2 patient has now completed the initial chemotherapy of one cycle of Gemzar. Unfortunately his course was complicated by development of abdominal pain weakness and fatigue and therefore he was admitted to the hospital with a small bowel obstruction this is now resolved.  #3 patient has completed concurrent radiation therapy and Xeloda. Overall he tolerated it well. He started the radiation 09/24/2012 through 10/31/2012. Concurrently he did receive escalating doses of Xeloda but maximal dose of we got into him was 1500 mg daily Monday through Friday.  #4 patient began adjuvant Gemzar on 11/20/2012. His dosing was thousand milligrams per meter squared. He tolerated it well. He is now here for cycle 2 of Gemzar only.his platelets are normal so therefore we will dose him at 1000 mg per meter squared.    PLAN:  #1 patient will proceed with scheduled Gemzar. We will dose him at 1000 mg per meter squared  #2 he will return in 1 weeks' time for next scheduled cycle of treatment..  All questions were answered. The patient knows to call the clinic with any problems, questions or concerns. We can certainly see the patient much sooner if necessary.  I spent 25 minutes counseling the patient face to face. The total time spent in the appointment was 30 minutes.  Drue Second, MD Medical/Oncology [facility] [phone] (beeper) [phone] (Office)  09/08/2012, 11:09 AM

## 2012-12-15 ENCOUNTER — Encounter (INDEPENDENT_AMBULATORY_CARE_PROVIDER_SITE_OTHER): Payer: Self-pay

## 2012-12-15 ENCOUNTER — Ambulatory Visit (INDEPENDENT_AMBULATORY_CARE_PROVIDER_SITE_OTHER): Payer: Medicare Other | Admitting: General Surgery

## 2012-12-15 ENCOUNTER — Encounter (INDEPENDENT_AMBULATORY_CARE_PROVIDER_SITE_OTHER): Payer: Self-pay | Admitting: General Surgery

## 2012-12-15 VITALS — BP 112/68 | HR 64 | Temp 96.6°F | Resp 18 | Ht 66.0 in | Wt 143.4 lb

## 2012-12-15 DIAGNOSIS — K8689 Other specified diseases of pancreas: Secondary | ICD-10-CM

## 2012-12-15 DIAGNOSIS — K8681 Exocrine pancreatic insufficiency: Secondary | ICD-10-CM

## 2012-12-15 DIAGNOSIS — C241 Malignant neoplasm of ampulla of Vater: Secondary | ICD-10-CM

## 2012-12-15 NOTE — Patient Instructions (Signed)
Will get letter prepared.   Follow up in 6 months.  Will let Dr Welton Flakes order follow up scans and labs.

## 2012-12-15 NOTE — Progress Notes (Signed)
HISTORY: Patient is 67 year old male who is approximately 6 months status post Whipple for ampullary carcinoma. He received chemoradiation postoperatively and is now receiving chemotherapy. He did have to have a dose reduction based on severe episode of gastroenteritis. He is doing much better with this other than some fatigue. He is finally starting to gain some weight. He denies abdominal pain, nausea, vomiting, reflux, diarrhea, bloating.   PERTINENT REVIEW OF SYSTEMS: Otherwise negative.    Filed Vitals:   12/15/12 1502  BP: 112/68  Pulse: 64  Temp: 96.6 F (35.9 C)  Resp: 18    EXAM: Head: Normocephalic and atraumatic.  Eyes:  Conjunctivae are normal. Pupils are equal, round, and reactive to light. No scleral icterus.  Neck:  Normal range of motion. Neck supple. No tracheal deviation present. No thyromegaly present.  Resp: No respiratory distress, normal effort. Abd:  Abdomen is soft, non distended and non tender. No masses are palpable.  There is no rebound and no guarding.  Neurological: Alert and oriented to person, place, and time. Coordination normal.  Skin: Skin is warm and dry. No rash noted. No diaphoretic. No erythema. No pallor.  Psychiatric: Normal mood and affect. Normal behavior. Judgment and thought content normal.      ASSESSMENT AND PLAN:   Exocrine pancreatic insufficiency Pt appears to be a good dose.  Continue creon.   Ampullary carcinoma Pt over halfway done with chemotherapy.  Continue chemo per Dr. Welton Flakes.  Follow up with me in 6 months.          Maudry Diego, MD Surgical Oncology, General & Endocrine Surgery [facility]  [name], MD [name], MD

## 2012-12-15 NOTE — Assessment & Plan Note (Signed)
Pt appears to be a good dose.  Continue creon.

## 2012-12-15 NOTE — Assessment & Plan Note (Signed)
Pt over halfway done with chemotherapy.  Continue chemo per Dr. Welton Flakes.  Follow up with me in 6 months.

## 2012-12-18 ENCOUNTER — Other Ambulatory Visit (HOSPITAL_BASED_OUTPATIENT_CLINIC_OR_DEPARTMENT_OTHER): Payer: Medicare Other | Admitting: Lab

## 2012-12-18 ENCOUNTER — Telehealth: Payer: Self-pay | Admitting: Oncology

## 2012-12-18 ENCOUNTER — Ambulatory Visit (HOSPITAL_BASED_OUTPATIENT_CLINIC_OR_DEPARTMENT_OTHER): Payer: Medicare Other | Admitting: Oncology

## 2012-12-18 ENCOUNTER — Ambulatory Visit (HOSPITAL_BASED_OUTPATIENT_CLINIC_OR_DEPARTMENT_OTHER): Payer: Medicare Other

## 2012-12-18 VITALS — BP 99/66 | HR 56 | Temp 97.5°F | Resp 18 | Ht 66.0 in | Wt 143.3 lb

## 2012-12-18 DIAGNOSIS — C241 Malignant neoplasm of ampulla of Vater: Secondary | ICD-10-CM

## 2012-12-18 DIAGNOSIS — E860 Dehydration: Secondary | ICD-10-CM

## 2012-12-18 LAB — COMPREHENSIVE METABOLIC PANEL (CC13)
AST: 38 U/L — ABNORMAL HIGH (ref 5–34)
Albumin: 3.2 g/dL — ABNORMAL LOW (ref 3.5–5.0)
BUN: 10.4 mg/dL (ref 7.0–26.0)
Calcium: 9.1 mg/dL (ref 8.4–10.4)
Chloride: 109 mEq/L — ABNORMAL HIGH (ref 98–107)
Potassium: 3.9 mEq/L (ref 3.5–5.1)
Sodium: 139 mEq/L (ref 136–145)
Total Protein: 7.6 g/dL (ref 6.4–8.3)

## 2012-12-18 LAB — CBC WITH DIFFERENTIAL/PLATELET
BASO%: 1 % (ref 0.0–2.0)
Basophils Absolute: 0 10*3/uL (ref 0.0–0.1)
HCT: 36.4 % — ABNORMAL LOW (ref 38.4–49.9)
HGB: 12.6 g/dL — ABNORMAL LOW (ref 13.0–17.1)
MCHC: 34.6 g/dL (ref 32.0–36.0)
MONO#: 0.5 10*3/uL (ref 0.1–0.9)
NEUT%: 34.8 % — ABNORMAL LOW (ref 39.0–75.0)
RDW: 14.9 % — ABNORMAL HIGH (ref 11.0–14.6)
WBC: 3.1 10*3/uL — ABNORMAL LOW (ref 4.0–10.3)
lymph#: 1.5 10*3/uL (ref 0.9–3.3)

## 2012-12-18 MED ORDER — SODIUM CHLORIDE 0.9 % IV SOLN
Freq: Once | INTRAVENOUS | Status: AC
  Administered 2012-12-18: 14:00:00 via INTRAVENOUS

## 2012-12-18 MED ORDER — CIPROFLOXACIN HCL 500 MG PO TABS: 500.0000 mg | ORAL_TABLET | Freq: Two times a day (BID) | ORAL | Status: DC

## 2012-12-18 MED ORDER — HEPARIN SOD (PORK) LOCK FLUSH 100 UNIT/ML IV SOLN
500.0000 [IU] | Freq: Once | INTRAVENOUS | Status: AC
  Administered 2012-12-18: 500 [IU] via INTRAVENOUS
  Filled 2012-12-18: qty 5

## 2012-12-18 MED ORDER — SODIUM CHLORIDE 0.9 % IJ SOLN
10.0000 mL | INTRAMUSCULAR | Status: DC | PRN
  Administered 2012-12-18: 10 mL via INTRAVENOUS
  Filled 2012-12-18: qty 10

## 2012-12-18 NOTE — Patient Instructions (Addendum)
Hold chemotherapy today  IVF today  Begin cipro 500 mg twice a day for 7 days

## 2012-12-18 NOTE — Patient Instructions (Signed)
Dehydration, Adult Dehydration is when you lose more fluids from the body than you take in. Vital organs like the kidneys, brain, and heart cannot function without a proper amount of fluids and salt. Any loss of fluids from the body can cause dehydration.  CAUSES   Vomiting.  Diarrhea.  Excessive sweating.  Excessive urine output.  Fever. SYMPTOMS  Mild dehydration  Thirst.  Dry lips.  Slightly dry mouth. Moderate dehydration  Very dry mouth.  Sunken eyes.  Skin does not bounce back quickly when lightly pinched and released.  Dark urine and decreased urine production.  Decreased tear production.  Headache. Severe dehydration  Very dry mouth.  Extreme thirst.  Rapid, weak pulse (more than 100 beats per minute at rest).  Cold hands and feet.  Not able to sweat in spite of heat and temperature.  Rapid breathing.  Blue lips.  Confusion and lethargy.  Difficulty being awakened.  Minimal urine production.  No tears. DIAGNOSIS  Your caregiver will diagnose dehydration based on your symptoms and your exam. Blood and urine tests will help confirm the diagnosis. The diagnostic evaluation should also identify the cause of dehydration. TREATMENT  Treatment of mild or moderate dehydration can often be done at home by increasing the amount of fluids that you drink. It is best to drink small amounts of fluid more often. Drinking too much at one time can make vomiting worse. Refer to the home care instructions below. Severe dehydration needs to be treated at the hospital where you will probably be given intravenous (IV) fluids that contain water and electrolytes. HOME CARE INSTRUCTIONS   Ask your caregiver about specific rehydration instructions.  Drink enough fluids to keep your urine clear or pale yellow.  Drink small amounts frequently if you have nausea and vomiting.  Eat as you normally do.  Avoid:  Foods or drinks high in sugar.  Carbonated  drinks.  Juice.  Extremely hot or cold fluids.  Drinks with caffeine.  Fatty, greasy foods.  Alcohol.  Tobacco.  Overeating.  Gelatin desserts.  Wash your hands well to avoid spreading bacteria and viruses.  Only take over-the-counter or prescription medicines for pain, discomfort, or fever as directed by your caregiver.  Ask your caregiver if you should continue all prescribed and over-the-counter medicines.  Keep all follow-up appointments with your caregiver. SEEK MEDICAL CARE IF:  You have abdominal pain and it increases or stays in one area (localizes).  You have a rash, stiff neck, or severe headache.  You are irritable, sleepy, or difficult to awaken.  You are weak, dizzy, or extremely thirsty. SEEK IMMEDIATE MEDICAL CARE IF:   You are unable to keep fluids down or you get worse despite treatment.  You have frequent episodes of vomiting or diarrhea.  You have blood or green matter (bile) in your vomit.  You have blood in your stool or your stool looks black and tarry.  You have not urinated in 6 to 8 hours, or you have only urinated a small amount of very dark urine.  You have a fever.  You faint. MAKE SURE YOU:   Understand these instructions.  Will watch your condition.  Will get help right away if you are not doing well or get worse. Document Released: 08/06/2005 Document Revised: 10/29/2011 Document Reviewed: 03/26/2011 [employer] Patient Information 2013 [employer].  

## 2012-12-18 NOTE — Progress Notes (Signed)
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 He underwent pancreatic code duodenectomy. His final pathology revealed a well to moderately differentiated adenocarcinoma the tumor arose from high grade dysplasia involving small bowel mucosa in the ampulla invaded into the pancreas margins. The margins were negative LV I was noted one lymph node was positive for metastatic disease additional 14 lymph nodes were negative. Portal lymph node resection revealed 5 benign lymph nodes. Pathologically T3 N1.  #2 patient completed adjuvant Gemzar for one cycle prior to start of radiation therapy he tolerated it well.   #3 he completed adjuvant radiation with radiosensitizing Xeloda on 10/31/2012. He tolerated the dose well   #4 begin adjuvant single agent Gemzar 3 weeks on one week off beginning 11/20/2012. We'll plan on giving him a total of 3 cycles.   CURRENT THERAPY: gemzar 3 weeks on 1 week off INTERVAL HISTORY: Colin Castro 67 y.o. male returns for followup visit he is accompanied by his wife.  Overall he is doing wellHe denies any fevers chills night sweats headaches shortness of breath chest pains palpitations no myalgias and arthralgias no diarrhea no bleeding. Remainder of the 10 point review of systems is negative MEDICAL HISTORY:  Past Medical History  Diagnosis Date  . Hypertension   . High cholesterol   . Coronary artery disease   . Common bile duct (CBD) stricture     Malignant  . 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 Pneumonia   . Constipation   . Myocardial infarction     15 years ago     ALLERGIES:  has No Known Allergies.  MEDICATIONS:  Current Outpatient Prescriptions  Medication Sig Dispense Refill  . acetaminophen (TYLENOL) 500 MG tablet Take 500 mg by mouth every 6 (six) hours as needed. For pain      . lipase/protease/amylase (CREON) 12000 UNITS CPEP Take 2 capsules by mouth 3 (three) times daily before meals.  180 capsule  12  . metoprolol tartrate (LOPRESSOR) 25 MG tablet       . Multiple Vitamin (MULTIVITAMIN WITH MINERALS) TABS Take 1 tablet by mouth daily.      . pravastatin (PRAVACHOL) 40 MG tablet       . capecitabine (XELODA) 150 MG tablet Take by mouth 2 (two) times daily after a meal.      . capecitabine (XELODA) 500 MG tablet Take 1 tablet (500 mg total) by mouth 2 (two) times daily after a meal. Take Monday - Friday with radiation  60 tablet  2  . ciprofloxacin (CIPRO) 500 MG tablet Take 1 tablet (500 mg total) by mouth 2 (two) times daily.  14 tablet  0  . hydrOXYzine (ATARAX/VISTARIL) 10 MG tablet Take 10 mg by mouth 2 (two) times daily as needed. For itching      . lidocaine-prilocaine (EMLA) cream Apply topically as needed. Apply to port site 1-2 hours prior to chemotherapy appt.  30 g  3  . megestrol (MEGACE) 400 MG/10ML suspension Take 10 mLs (400 mg total) by mouth 2 (two) times daily.  240 mL  3  . ondansetron (ZOFRAN) 4 MG tablet Take 4 mg by mouth every 6 (six) hours as  needed. For nausea      . polyethylene glycol powder (MIRALAX) powder Take 17 g by mouth daily.  255 g  4  . zolpidem (AMBIEN) 5 MG tablet Take 1 tablet (5 mg total) by mouth at bedtime as needed for sleep.  30 tablet  0   No current facility-administered medications for this visit.   Facility-Administered Medications Ordered in Other Visits  Medication Dose Route Frequency Provider Last Rate Last Dose  . sodium chloride 0.9 % injection 10 mL  10 mL Intravenous PRN Victorino December, MD   10 mL at 10/30/12 1409    SURGICAL HISTORY:  Past Surgical History  Procedure  Laterality Date  . Ercp  05/07/2012    Procedure: ENDOSCOPIC RETROGRADE CHOLANGIOPANCREATOGRAPHY (ERCP);  Surgeon: [name], MD;  Location: [facility];  Service: Endoscopy;  Laterality: N/A;  Dr [name]  . Eus  05/07/2012    Procedure: UPPER ENDOSCOPIC ULTRASOUND (EUS) RADIAL;  Surgeon: [name], MD;  Location: [facility];  Service: Endoscopy;;  . Appendectomy      open, 25 yrs ago  . Coronary stent placement      2 stents placed  . Whipple procedure  06/06/2012    Procedure: WHIPPLE PROCEDURE;  Surgeon: [name], MD;  Location: [facility] OR;  Service: General;  Laterality: N/A;  Pancreatico duodenectomy  . Cholecystectomy  06/06/2012    Procedure: CHOLECYSTECTOMY;  Surgeon: [name], MD;  Location: [facility] OR;  Service: General;  Laterality: N/A;  . Pancreatic stent placement  06/06/2012    Procedure: PANCREATIC STENT PLACEMENT;  Surgeon: [name], MD;  Location: [facility] OR;  Service: General;  Laterality: N/A;  Removal of biliary stent; Placement of pacreatic stent  . Lymph node dissection  06/06/2012    Procedure: LYMPH NODE DISSECTION;  Surgeon: [name], MD;  Location: [facility] OR;  Service: General;  Laterality: N/A;  Portal lymph node dissection  . Coronary angioplasty      8 or 9 years ago  . Portacath placement  07/24/2012    Procedure: INSERTION PORT-A-CATH;  Surgeon: [name], MD;  Location: [facility] OR;  Service: General;  Laterality: Left;    REVIEW OF SYSTEMS:   General: fatigue (+), night sweats (-), fever (-), pain (-) Lymph: palpable nodes (-) HEENT: vision changes (-), mucositis (-), gum bleeding (-), epistaxis (-) Cardiovascular: chest pain (-), palpitations (-) Pulmonary: shortness of breath (-), dyspnea on exertion (-), cough (-), hemoptysis (-) GI:  Early satiety (-), melena (-), dysphagia (-), nausea/vomiting (-), diarrhea (-) GU: dysuria (-), hematuria (-), incontinence (-) Musculoskeletal: joint swelling (-), joint pain (-), back pain (-) Neuro: weakness  (-), numbness (-), headache (-), confusion (-) Skin: Rash (-), lesions (-), dryness (-) Psych: depression (-), suicidal/homicidal ideation (-), feeling of hopelessness (-)   PHYSICAL EXAMINATION: Blood pressure 104/69, pulse 64, temperature 97.6 F (36.4 C), temperature source Oral, resp. rate 20, height 5\' 6"  (1.676 m), weight 138 lb 11.2 oz (62.914 kg). Body mass index is 22.4 kg/(m^2). General: Patient is a well appearing male in no acute distress HEENT: PERRLA, sclerae anicteric no conjunctival pallor, MMM Neck: supple, no palpable adenopathy Lungs: clear to auscultation bilaterally, no wheezes, rhonchi, or rales Cardiovascular: tachycardic, regular rhythm, S1, S2, no murmurs, rubs or gallops Abdomen: Soft, non-distended, normoactive bowel sounds, no HSM, extremely tender to palpation, particularly the RLQ, with rebound.  Patient guarding abdomen when moving on exam table and grimacing while moving.  Extremities: warm and well perfused, no clubbing, cyanosis,  or edema Skin: No rashes or lesions Neuro: Non-focal ECOG PERFORMANCE STATUS: 1 - Symptomatic but completely ambulatory   LABORATORY DATA: Lab Results  Component Value Date   WBC 3.1* 12/18/2012   HGB 12.6* 12/18/2012   HCT 36.4* 12/18/2012   MCV 88.1 12/18/2012   PLT 161 12/18/2012      Chemistry      Component Value Date/Time   NA 139 12/18/2012 1201   NA 134* 08/30/2012 0404   K 3.9 12/18/2012 1201   K 4.1 08/30/2012 0404   CL 109* 12/18/2012 1201   CL 106 08/30/2012 0404   CO2 20* 12/18/2012 1201   CO2 17* 08/30/2012 0404   BUN 10.4 12/18/2012 1201   BUN 6 08/30/2012 0404   CREATININE 0.7 12/18/2012 1201   CREATININE 0.75 08/30/2012 0404      Component Value Date/Time   CALCIUM 9.1 12/18/2012 1201   CALCIUM 8.4 08/30/2012 0404   ALKPHOS 108 12/18/2012 1201   ALKPHOS 99 08/28/2012 0430   AST 38* 12/18/2012 1201   AST 17 08/28/2012 0430   ALT 60* 12/18/2012 1201   ALT 29 08/28/2012 0430   BILITOT 0.30 12/18/2012 1201   BILITOT 0.3 08/28/2012 0430        RADIOGRAPHIC STUDIES:    ASSESSMENT: 67 year old gentleman with  #1 periampullary carcinoma stage III patient is status post Whipple procedure overall she's done well with it. You will not receive adjuvant chemotherapy initially consisting of Gemzar for one cycle. He will then go on to receive concomitant chemoradiation with chemotherapy consisting of xeloda. Risks and benefits of treatment have been discussed with them completely.  #2 patient has now completed the initial chemotherapy of one cycle of Gemzar. Unfortunately his course was complicated by development of abdominal pain weakness and fatigue and therefore he was admitted to the hospital with a small bowel obstruction this is now resolved.  .#3 patient has completed concurrent radiation therapy and Xeloda. Overall he tolerated it well. He started the radiation 09/24/2012 through 10/31/2012. Concurrently he did receive escalating doses of Xeloda but maximal dose of we got into him was 1500 mg daily Monday through Friday.   #4 patient began adjuvant Gemzar on 11/20/2012. His dosing was thousand milligrams per meter squared. He tolerated it well. He is now here for cycle 2 of Gemzar only.his platelets are normal so therefore we will dose him at 1000 mg per meter squared.    PLAN:  #1 patient will proceed with Gemzar today we will dose him at 1000 mg per meter squared.  #2he will return in one week's time for followup.    All questions were answered. The patient knows to call the clinic with any problems, questions or concerns. We can certainly see the patient much sooner if necessary.  I spent 25 minutes counseling the patient face to face. The total time spent in the appointment was 30 minutes.  Drue Second, MD Medical/Oncology [facility] [phone] (beeper) [phone] (Office)

## 2012-12-25 ENCOUNTER — Ambulatory Visit (HOSPITAL_BASED_OUTPATIENT_CLINIC_OR_DEPARTMENT_OTHER): Payer: Medicare Other | Admitting: Oncology

## 2012-12-25 ENCOUNTER — Other Ambulatory Visit (HOSPITAL_BASED_OUTPATIENT_CLINIC_OR_DEPARTMENT_OTHER): Payer: Medicare Other | Admitting: Lab

## 2012-12-25 ENCOUNTER — Telehealth: Payer: Self-pay | Admitting: Oncology

## 2012-12-25 ENCOUNTER — Ambulatory Visit (HOSPITAL_BASED_OUTPATIENT_CLINIC_OR_DEPARTMENT_OTHER): Payer: Medicare Other

## 2012-12-25 ENCOUNTER — Other Ambulatory Visit: Payer: Medicare Other | Admitting: Lab

## 2012-12-25 ENCOUNTER — Other Ambulatory Visit: Payer: Self-pay | Admitting: Medical Oncology

## 2012-12-25 VITALS — BP 94/65 | HR 60 | Temp 97.5°F | Resp 18 | Ht 66.0 in | Wt 143.2 lb

## 2012-12-25 DIAGNOSIS — R5383 Other fatigue: Secondary | ICD-10-CM

## 2012-12-25 DIAGNOSIS — C779 Secondary and unspecified malignant neoplasm of lymph node, unspecified: Secondary | ICD-10-CM

## 2012-12-25 DIAGNOSIS — E860 Dehydration: Secondary | ICD-10-CM

## 2012-12-25 DIAGNOSIS — C241 Malignant neoplasm of ampulla of Vater: Secondary | ICD-10-CM

## 2012-12-25 DIAGNOSIS — D696 Thrombocytopenia, unspecified: Secondary | ICD-10-CM

## 2012-12-25 DIAGNOSIS — R5381 Other malaise: Secondary | ICD-10-CM

## 2012-12-25 LAB — CBC WITH DIFFERENTIAL/PLATELET
Eosinophils Absolute: 0.1 10*3/uL (ref 0.0–0.5)
HCT: 53.9 % — ABNORMAL HIGH (ref 38.4–49.9)
LYMPH%: 20.8 % (ref 14.0–49.0)
MCHC: 23.6 g/dL — ABNORMAL LOW (ref 32.0–36.0)
MCV: 135.4 fL — ABNORMAL HIGH (ref 79.3–98.0)
MONO#: 1 10*3/uL — ABNORMAL HIGH (ref 0.1–0.9)
MONO%: 17 % — ABNORMAL HIGH (ref 0.0–14.0)
NEUT%: 60.2 % (ref 39.0–75.0)
Platelets: 79 10*3/uL — ABNORMAL LOW (ref 140–400)
RBC: 3.98 10*6/uL — ABNORMAL LOW (ref 4.20–5.82)
WBC: 6.1 10*3/uL (ref 4.0–10.3)
nRBC: 0 % (ref 0–0)

## 2012-12-25 LAB — COMPREHENSIVE METABOLIC PANEL (CC13)
BUN: 10.9 mg/dL (ref 7.0–26.0)
CO2: 21 mEq/L — ABNORMAL LOW (ref 22–29)
Calcium: 8.9 mg/dL (ref 8.4–10.4)
Chloride: 111 mEq/L — ABNORMAL HIGH (ref 98–107)
Creatinine: 0.8 mg/dL (ref 0.7–1.3)
Total Bilirubin: 0.51 mg/dL (ref 0.20–1.20)

## 2012-12-25 MED ORDER — SODIUM CHLORIDE 0.9 % IV SOLN
Freq: Once | INTRAVENOUS | Status: AC
  Administered 2012-12-25: 13:00:00 via INTRAVENOUS

## 2012-12-25 NOTE — Patient Instructions (Addendum)
Dehydration, Adult Dehydration means your body does not have as much fluid as it needs. Your kidneys, brain, and heart will not work properly without the right amount of fluids and salt.  HOME CARE  Ask your doctor how to replace body fluid losses (rehydrate).  Drink enough fluids to keep your pee (urine) clear or pale yellow.  Drink small amounts of fluids often if you feel sick to your stomach (nauseous) or throw up (vomit).  Eat like you normally do.  Avoid:  Foods or drinks high in sugar.  Bubbly (carbonated) drinks.  Juice.  Very hot or cold fluids.  Drinks with caffeine.  Fatty, greasy foods.  Alcohol.  Tobacco.  Eating too much.  Gelatin desserts.  Wash your hands to avoid spreading germs (bacteria, viruses).  Only take medicine as told by your doctor.  Keep all doctor visits as told. GET HELP RIGHT AWAY IF:   You cannot drink something without throwing up.  You get worse even with treatment.  Your vomit has blood in it or looks greenish.  Your poop (stool) has blood in it or looks black and tarry.  You have not peed in 6 to 8 hours.  You pee a small amount of very dark pee.  You have a fever.  You pass out (faint).  You have belly (abdominal) pain that gets worse or stays in one spot (localizes).  You have a rash, stiff neck, or bad headache.  You get easily annoyed, sleepy, or are hard to wake up.  You feel weak, dizzy, or very thirsty. MAKE SURE YOU:   Understand these instructions.  Will watch your condition.  Will get help right away if you are not doing well or get worse. Document Released: 06/02/2009 Document Revised: 10/29/2011 Document Reviewed: 03/26/2011 [employer] Patient Information 2013 [employer].  

## 2012-12-25 NOTE — Patient Instructions (Signed)
Hold chemotherapy today due to low platelets  IVF today  I will see you back in 1 week

## 2012-12-28 NOTE — Progress Notes (Signed)
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 He underwent pancreatic code duodenectomy. His final pathology revealed a well to moderately differentiated adenocarcinoma the tumor arose from high grade dysplasia involving small bowel mucosa in the ampulla invaded into the pancreas margins. The margins were negative LV I was noted one lymph node was positive for metastatic disease additional 14 lymph nodes were negative. Portal lymph node resection revealed 5 benign lymph nodes. Pathologically T3 N1.  #2 patient completed adjuvant Gemzar for one cycle prior to start of radiation therapy he tolerated it well.   #3 he completed adjuvant radiation with radiosensitizing Xeloda on 10/31/2012. He tolerated the dose well  #4 began adjuvant single agent Gemzar 3 weeks on one week off beginning 11/20/2012. We'll plan on giving him a total of 3 cycles.  CURRENT THERAPY: Gemzar week 2/3 to be held  INTERVAL HISTORY: Colin Castro 67 y.o. male returns for followup visit he is accompanied by his wife and interpreter.Patient seems to be a little bit more weaker today hasn't been eating or drinking well. He has not had any nausea. He has not had any fevers chills only weakness he is not eating. He has some myalgias. He is tired. He has not had any bleeding. He has no diarrhea. No stomach pains. Remainder of the review of systems is unremarkable.[name]  MEDICAL HISTORY:  Past Medical History  Diagnosis Date  . Hypertension   . High cholesterol   . Coronary artery disease   . Common bile duct (CBD) stricture     Malignant  . 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 Pneumonia   . Constipation   . Myocardial infarction     15 years ago    ALLERGIES:  has No Known Allergies.  MEDICATIONS:  Current Outpatient Prescriptions  Medication Sig Dispense Refill  . acetaminophen (TYLENOL) 500 MG tablet Take 500 mg by mouth every 6 (six) hours as needed. For pain      . lidocaine-prilocaine (EMLA) cream Apply topically as needed. Apply to port site 1-2 hours prior to chemotherapy appt.  30 g  3  . lipase/protease/amylase (CREON) 12000 UNITS CPEP Take 2 capsules by mouth 3 (three) times daily before meals.  180 capsule  12  . metoprolol tartrate (LOPRESSOR) 25 MG tablet       . Multiple Vitamin (MULTIVITAMIN WITH MINERALS) TABS Take 1 tablet by mouth daily.      . capecitabine (XELODA) 150 MG tablet Take by mouth 2 (two) times daily after a meal.      . capecitabine (XELODA) 500 MG tablet Take 1 tablet (500 mg total) by mouth 2 (two) times daily after a meal. Take Monday - Friday with radiation  60 tablet  2  . ciprofloxacin (CIPRO) 500 MG tablet Take 1 tablet (500 mg total) by mouth 2 (two) times daily.  14 tablet  0  . hydrOXYzine (ATARAX/VISTARIL) 10 MG tablet Take 10 mg by mouth 2 (two) times daily as needed. For itching      . megestrol (MEGACE) 400 MG/10ML suspension Take 10 mLs (400 mg total) by mouth 2 (two) times daily.  240 mL  3  . ondansetron (  ZOFRAN) 4 MG tablet Take 4 mg by mouth every 6 (six) hours as needed. For nausea      . polyethylene glycol powder (MIRALAX) powder Take 17 g by mouth daily.  255 g  4  . pravastatin (PRAVACHOL) 40 MG tablet       . zolpidem (AMBIEN) 5 MG tablet Take 1 tablet (5 mg total) by mouth at bedtime as needed for sleep.  30 tablet  0   No current facility-administered medications for this visit.   Facility-Administered Medications Ordered in Other Visits  Medication Dose Route Frequency Provider Last Rate Last Dose  . sodium chloride 0.9 % injection 10 mL  10 mL Intravenous  PRN Victorino December, MD   10 mL at 10/30/12 1409    SURGICAL HISTORY:  Past Surgical History  Procedure Laterality Date  . Ercp  05/07/2012    Procedure: ENDOSCOPIC RETROGRADE CHOLANGIOPANCREATOGRAPHY (ERCP);  Surgeon: [name], MD;  Location: [facility];  Service: Endoscopy;  Laterality: N/A;  Dr [name]  . Eus  05/07/2012    Procedure: UPPER ENDOSCOPIC ULTRASOUND (EUS) RADIAL;  Surgeon: [name], MD;  Location: [facility];  Service: Endoscopy;;  . Appendectomy      open, 25 yrs ago  . Coronary stent placement      2 stents placed  . Whipple procedure  06/06/2012    Procedure: WHIPPLE PROCEDURE;  Surgeon: [name], MD;  Location: [facility] OR;  Service: General;  Laterality: N/A;  Pancreatico duodenectomy  . Cholecystectomy  06/06/2012    Procedure: CHOLECYSTECTOMY;  Surgeon: [name], MD;  Location: [facility] OR;  Service: General;  Laterality: N/A;  . Pancreatic stent placement  06/06/2012    Procedure: PANCREATIC STENT PLACEMENT;  Surgeon: [name], MD;  Location: [facility] OR;  Service: General;  Laterality: N/A;  Removal of biliary stent; Placement of pacreatic stent  . Lymph node dissection  06/06/2012    Procedure: LYMPH NODE DISSECTION;  Surgeon: [name], MD;  Location: [facility] OR;  Service: General;  Laterality: N/A;  Portal lymph node dissection  . Coronary angioplasty      8 or 9 years ago  . Portacath placement  07/24/2012    Procedure: INSERTION PORT-A-CATH;  Surgeon: [name], MD;  Location: [facility] OR;  Service: General;  Laterality: Left;    REVIEW OF SYSTEMS:   General: fatigue (+), night sweats (-), fever (-), pain (-) Lymph: palpable nodes (-) HEENT: vision changes (-), mucositis (-), gum bleeding (-), epistaxis (-) Cardiovascular: chest pain (-), palpitations (-) Pulmonary: shortness of breath (-), dyspnea on exertion (-), cough (-), hemoptysis (-) GI:  Early satiety (-), melena (-), dysphagia (-), nausea/vomiting (-), diarrhea (-) GU: dysuria (-), hematuria  (-), incontinence (-) Musculoskeletal: joint swelling (-), joint pain (-), back pain (-) Neuro: weakness (-), numbness (-), headache (-), confusion (-) Skin: Rash (-), lesions (-), dryness (-) Psych: depression (-), suicidal/homicidal ideation (-), feeling of hopelessness (-)   PHYSICAL EXAMINATION: Blood pressure 99/66, pulse 56, temperature 97.5 F (36.4 C), temperature source Oral, resp. rate 18, height 5\' 6"  (1.676 m), weight 143 lb 4.8 oz (65 kg). Body mass index is 23.14 kg/(m^2). General: Patient is a well appearing male in no acute distress HEENT: PERRLA, sclerae anicteric no conjunctival pallor, MMM Neck: supple, no palpable adenopathy Lungs: clear to auscultation bilaterally, no wheezes, rhonchi, or rales Cardiovascular: tachycardic, regular rhythm, S1, S2, no murmurs, rubs or gallops Abdomen: Soft, non-distended, normoactive bowel sounds, no HSM, extremely tender to palpation, particularly  the RLQ, with rebound.  Patient guarding abdomen when moving on exam table and grimacing while moving.  Extremities: warm and well perfused, no clubbing, cyanosis, or edema Skin: No rashes or lesions Neuro: Non-focal  ECOG PERFORMANCE STATUS: 1 - Symptomatic but completely ambulatory   LABORATORY DATA: Lab Results  Component Value Date   WBC 6.1 12/25/2012   HGB 12.7* 12/25/2012   HCT 53.9* 12/25/2012   MCV 135.4* 12/25/2012   PLT 79* 12/25/2012      Chemistry      Component Value Date/Time   NA 140 12/25/2012 1105   NA 134* 08/30/2012 0404   K 4.0 12/25/2012 1105   K 4.1 08/30/2012 0404   CL 111* 12/25/2012 1105   CL 106 08/30/2012 0404   CO2 21* 12/25/2012 1105   CO2 17* 08/30/2012 0404   BUN 10.9 12/25/2012 1105   BUN 6 08/30/2012 0404   CREATININE 0.8 12/25/2012 1105   CREATININE 0.75 08/30/2012 0404      Component Value Date/Time   CALCIUM 8.9 12/25/2012 1105   CALCIUM 8.4 08/30/2012 0404   ALKPHOS 122 12/25/2012 1105   ALKPHOS 99 08/28/2012 0430   AST 29 12/25/2012 1105   AST 17 08/28/2012 0430    ALT 38 12/25/2012 1105   ALT 29 08/28/2012 0430   BILITOT 0.51 12/25/2012 1105   BILITOT 0.3 08/28/2012 0430       RADIOGRAPHIC STUDIES:    ASSESSMENT: 67 year old gentleman with  #1 periampullary carcinoma stage III patient is status post Whipple procedure overall she's done well with it. You will not receive adjuvant chemotherapy initially consisting of Gemzar for one cycle. He will then go on to receive concomitant chemoradiation with chemotherapy consisting of xeloda. Risks and benefits of treatment have been discussed with them completely.  #2 patient has now completed the initial chemotherapy of one cycle of Gemzar. Unfortunately his course was complicated by development of abdominal pain weakness and fatigue and therefore he was admitted to the hospital with a small bowel obstruction this is now resolved.  #3 patient has completed concurrent radiation therapy and Xeloda. Overall he tolerated it well. He started the radiation 09/24/2012 through 10/31/2012. Concurrently he did receive escalating doses of Xeloda but maximal dose of we got into him was 1500 mg daily Monday through Friday.  #4 patient began adjuvant Gemzar on 11/20/2012. His dosing was thousand milligrams per meter squared. He tolerated it well. He is now here for cycle 2 of Gemzar only.his platelets are normal so therefore we will dose him at 1000 mg per meter squared.    PLAN:  #1 since patient is not feeling well I will hold his chemotherapy today. He will be given IV fluids.  #2 he will return in 1 weeks' time for next scheduled cycle of treatment..  All questions were answered. The patient knows to call the clinic with any problems, questions or concerns. We can certainly see the patient much sooner if necessary.  I spent 25 minutes counseling the patient face to face. The total time spent in the appointment was 30 minutes.  Drue Second, MD Medical/Oncology [facility] [phone]  (beeper) [phone] (Office)  09/08/2012, 11:09 AM

## 2012-12-29 NOTE — Progress Notes (Signed)
OFFICE PROGRESS NOTE  CC  Colin Rodriguez, MD [address] 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 He underwent pancreatic code duodenectomy. His final pathology revealed a well to moderately differentiated adenocarcinoma the tumor arose from high grade dysplasia involving small bowel mucosa in the ampulla invaded into the pancreas margins. The margins were negative LV I was noted one lymph node was positive for metastatic disease additional 14 lymph nodes were negative. Portal lymph node resection revealed 5 benign lymph nodes. Pathologically T3 N1.  #2 patient completed adjuvant Gemzar for one cycle prior to start of radiation therapy he tolerated it well.   #3 he completed adjuvant radiation with radiosensitizing Xeloda on 10/31/2012. He tolerated the dose well  #4 began adjuvant single agent Gemzar 3 weeks on one week off beginning 11/20/2012. We'll plan on giving him a total of 3 cycles.  CURRENT THERAPY: Gemzar week 2/3 to be held  INTERVAL HISTORY: Colin Castro 67 y.o. male returns for followup visit he is accompanied by his wife and interpreter.Patient seems to be a little bit more weaker today hasn't been eating or drinking well. He has not had any nausea. He has not had any fevers chills only weakness he is not eating. He has some myalgias. He is tired. He has not had any bleeding. He has no diarrhea. No stomach pains. Remainder of the review of systems is unremarkable.[name]  MEDICAL HISTORY:  Past Medical History  Diagnosis Date  . Hypertension   . High cholesterol   . Coronary artery disease   . Common bile duct (CBD) stricture     Malignant  . 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 Pneumonia   . Constipation   . Myocardial infarction     15 years ago    ALLERGIES:  has No Known Allergies.  MEDICATIONS:  Current Outpatient Prescriptions  Medication Sig Dispense Refill  . acetaminophen (TYLENOL) 500 MG tablet Take 500 mg by mouth every 6 (six) hours as needed. For pain      . ciprofloxacin (CIPRO) 500 MG tablet Take 1 tablet (500 mg total) by mouth 2 (two) times daily.  14 tablet  0  . lidocaine-prilocaine (EMLA) cream Apply topically as needed. Apply to port site 1-2 hours prior to chemotherapy appt.  30 g  3  . lipase/protease/amylase (CREON) 12000 UNITS CPEP Take 2 capsules by mouth 3 (three) times daily before meals.  180 capsule  12  . metoprolol tartrate (LOPRESSOR) 25 MG tablet       . Multiple Vitamin (MULTIVITAMIN WITH MINERALS) TABS Take 1 tablet by mouth daily.      . ondansetron (ZOFRAN) 4 MG tablet Take 4 mg by mouth every 6 (six) hours as needed. For nausea      . zolpidem (AMBIEN) 5 MG tablet Take 1 tablet (5 mg total) by mouth at bedtime as needed for sleep.  30 tablet  0  . capecitabine (XELODA) 150 MG tablet Take by mouth 2 (two) times daily after a meal.      . capecitabine (XELODA) 500 MG tablet Take 1 tablet (500 mg total) by mouth 2 (two) times daily after a meal. Take Monday - Friday with radiation  60 tablet  2  .  hydrOXYzine (ATARAX/VISTARIL) 10 MG tablet Take 10 mg by mouth 2 (two) times daily as needed. For itching      . megestrol (MEGACE) 400 MG/10ML suspension Take 10 mLs (400 mg total) by mouth 2 (two) times daily.  240 mL  3  . polyethylene glycol powder (MIRALAX) powder Take 17 g by mouth daily.  255 g  4  . pravastatin (PRAVACHOL) 40 MG tablet        No current facility-administered medications for this visit.   Facility-Administered Medications Ordered in Other Visits  Medication Dose Route Frequency Provider Last Rate Last Dose  . sodium chloride 0.9 % injection 10 mL  10 mL Intravenous  PRN Victorino December, MD   10 mL at 10/30/12 1409    SURGICAL HISTORY:  Past Surgical History  Procedure Laterality Date  . Ercp  05/07/2012    Procedure: ENDOSCOPIC RETROGRADE CHOLANGIOPANCREATOGRAPHY (ERCP);  Surgeon: [name], MD;  Location: [facility];  Service: Endoscopy;  Laterality: N/A;  Dr [name]  . Eus  05/07/2012    Procedure: UPPER ENDOSCOPIC ULTRASOUND (EUS) RADIAL;  Surgeon: [name], MD;  Location: [facility];  Service: Endoscopy;;  . Appendectomy      open, 25 yrs ago  . Coronary stent placement      2 stents placed  . Whipple procedure  06/06/2012    Procedure: WHIPPLE PROCEDURE;  Surgeon: [name], MD;  Location: [facility] OR;  Service: General;  Laterality: N/A;  Pancreatico duodenectomy  . Cholecystectomy  06/06/2012    Procedure: CHOLECYSTECTOMY;  Surgeon: [name], MD;  Location: [facility] OR;  Service: General;  Laterality: N/A;  . Pancreatic stent placement  06/06/2012    Procedure: PANCREATIC STENT PLACEMENT;  Surgeon: [name], MD;  Location: [facility] OR;  Service: General;  Laterality: N/A;  Removal of biliary stent; Placement of pacreatic stent  . Lymph node dissection  06/06/2012    Procedure: LYMPH NODE DISSECTION;  Surgeon: [name], MD;  Location: [facility] OR;  Service: General;  Laterality: N/A;  Portal lymph node dissection  . Coronary angioplasty      8 or 9 years ago  . Portacath placement  07/24/2012    Procedure: INSERTION PORT-A-CATH;  Surgeon: [name], MD;  Location: [facility] OR;  Service: General;  Laterality: Left;    REVIEW OF SYSTEMS:   General: fatigue (+), night sweats (-), fever (-), pain (-) Lymph: palpable nodes (-) HEENT: vision changes (-), mucositis (-), gum bleeding (-), epistaxis (-) Cardiovascular: chest pain (-), palpitations (-) Pulmonary: shortness of breath (-), dyspnea on exertion (-), cough (-), hemoptysis (-) GI:  Early satiety (-), melena (-), dysphagia (-), nausea/vomiting (-), diarrhea (-) GU: dysuria (-), hematuria  (-), incontinence (-) Musculoskeletal: joint swelling (-), joint pain (-), back pain (-) Neuro: weakness (-), numbness (-), headache (-), confusion (-) Skin: Rash (-), lesions (-), dryness (-) Psych: depression (-), suicidal/homicidal ideation (-), feeling of hopelessness (-)   PHYSICAL EXAMINATION: Blood pressure 94/65, pulse 60, temperature 97.5 F (36.4 C), temperature source Oral, resp. rate 18, height 5\' 6"  (1.676 m), weight 143 lb 3.2 oz (64.955 kg). Body mass index is 23.12 kg/(m^2). General: Patient is a well appearing male in no acute distress HEENT: PERRLA, sclerae anicteric no conjunctival pallor, MMM Neck: supple, no palpable adenopathy Lungs: clear to auscultation bilaterally, no wheezes, rhonchi, or rales Cardiovascular: tachycardic, regular rhythm, S1, S2, no murmurs, rubs or gallops Abdomen: Soft, non-distended, normoactive bowel sounds, no HSM, extremely tender to palpation, particularly the  RLQ, with rebound.  Patient guarding abdomen when moving on exam table and grimacing while moving.  Extremities: warm and well perfused, no clubbing, cyanosis, or edema Skin: No rashes or lesions Neuro: Non-focal  ECOG PERFORMANCE STATUS: 1 - Symptomatic but completely ambulatory   LABORATORY DATA: Lab Results  Component Value Date   WBC 6.1 12/25/2012   HGB 12.7* 12/25/2012   HCT 53.9* 12/25/2012   MCV 135.4* 12/25/2012   PLT 79* 12/25/2012      Chemistry      Component Value Date/Time   NA 140 12/25/2012 1105   NA 134* 08/30/2012 0404   K 4.0 12/25/2012 1105   K 4.1 08/30/2012 0404   CL 111* 12/25/2012 1105   CL 106 08/30/2012 0404   CO2 21* 12/25/2012 1105   CO2 17* 08/30/2012 0404   BUN 10.9 12/25/2012 1105   BUN 6 08/30/2012 0404   CREATININE 0.8 12/25/2012 1105   CREATININE 0.75 08/30/2012 0404      Component Value Date/Time   CALCIUM 8.9 12/25/2012 1105   CALCIUM 8.4 08/30/2012 0404   ALKPHOS 122 12/25/2012 1105   ALKPHOS 99 08/28/2012 0430   AST 29 12/25/2012 1105   AST 17 08/28/2012 0430    ALT 38 12/25/2012 1105   ALT 29 08/28/2012 0430   BILITOT 0.51 12/25/2012 1105   BILITOT 0.3 08/28/2012 0430       RADIOGRAPHIC STUDIES:    ASSESSMENT: 67 year old gentleman with  #1 periampullary carcinoma stage III patient is status post Whipple procedure overall she's done well with it. You will not receive adjuvant chemotherapy initially consisting of Gemzar for one cycle. He will then go on to receive concomitant chemoradiation with chemotherapy consisting of xeloda. Risks and benefits of treatment have been discussed with them completely.  #2 patient has now completed the initial chemotherapy of one cycle of Gemzar. Unfortunately his course was complicated by development of abdominal pain weakness and fatigue and therefore he was admitted to the hospital with a small bowel obstruction this is now resolved.  #3 patient has completed concurrent radiation therapy and Xeloda. Overall he tolerated it well. He started the radiation 09/24/2012 through 10/31/2012. Concurrently he did receive escalating doses of Xeloda but maximal dose of we got into him was 1500 mg daily Monday through Friday.  #4 patient began adjuvant Gemzar on 11/20/2012. His dosing was thousand milligrams per meter squared. He tolerated it well. He is now here for cycle 2 of Gemzar only.his platelets are normal so therefore we will dose him at 1000 mg per meter squared.    PLAN:  #1 since patient is not feeling well and he is thrombocytopenic I will hold his chemotherapy today. He will be given IV fluids.  #2 thrombocytopenia observation  #2 he will return in 1 weeks' time for next scheduled cycle of treatment..  All questions were answered. The patient knows to call the clinic with any problems, questions or concerns. We can certainly see the patient much sooner if necessary.  I spent 25 minutes counseling the patient face to face. The total time spent in the appointment was 30 minutes.  Drue Second,  MD Medical/Oncology [facility] [phone] (beeper) [phone] (Office)  09/08/2012, 11:09 AM

## 2013-01-01 ENCOUNTER — Other Ambulatory Visit (HOSPITAL_BASED_OUTPATIENT_CLINIC_OR_DEPARTMENT_OTHER): Payer: Medicare Other | Admitting: Lab

## 2013-01-01 ENCOUNTER — Ambulatory Visit (HOSPITAL_BASED_OUTPATIENT_CLINIC_OR_DEPARTMENT_OTHER): Payer: Medicaid Other

## 2013-01-01 ENCOUNTER — Encounter: Payer: Self-pay | Admitting: Oncology

## 2013-01-01 ENCOUNTER — Ambulatory Visit (HOSPITAL_BASED_OUTPATIENT_CLINIC_OR_DEPARTMENT_OTHER): Payer: Medicare Other | Admitting: Oncology

## 2013-01-01 VITALS — BP 99/64 | HR 65 | Temp 97.7°F | Resp 20 | Ht 66.0 in | Wt 144.6 lb

## 2013-01-01 DIAGNOSIS — C241 Malignant neoplasm of ampulla of Vater: Secondary | ICD-10-CM

## 2013-01-01 DIAGNOSIS — Z5111 Encounter for antineoplastic chemotherapy: Secondary | ICD-10-CM

## 2013-01-01 DIAGNOSIS — R109 Unspecified abdominal pain: Secondary | ICD-10-CM

## 2013-01-01 LAB — COMPREHENSIVE METABOLIC PANEL (CC13)
ALT: 29 U/L (ref 0–55)
BUN: 10.7 mg/dL (ref 7.0–26.0)
CO2: 23 mEq/L (ref 22–29)
Calcium: 9 mg/dL (ref 8.4–10.4)
Chloride: 109 mEq/L — ABNORMAL HIGH (ref 98–107)
Creatinine: 0.8 mg/dL (ref 0.7–1.3)
Glucose: 96 mg/dl (ref 70–99)
Total Bilirubin: 0.33 mg/dL (ref 0.20–1.20)

## 2013-01-01 LAB — CBC WITH DIFFERENTIAL/PLATELET
BASO%: 0.5 % (ref 0.0–2.0)
Basophils Absolute: 0 10*3/uL (ref 0.0–0.1)
Eosinophils Absolute: 0.1 10*3/uL (ref 0.0–0.5)
HCT: 37.9 % — ABNORMAL LOW (ref 38.4–49.9)
HGB: 12.9 g/dL — ABNORMAL LOW (ref 13.0–17.1)
LYMPH%: 20.7 % (ref 14.0–49.0)
MONO#: 0.9 10*3/uL (ref 0.1–0.9)
NEUT#: 3.8 10*3/uL (ref 1.5–6.5)
NEUT%: 62.5 % (ref 39.0–75.0)
Platelets: 181 10*3/uL (ref 140–400)
WBC: 6.1 10*3/uL (ref 4.0–10.3)
lymph#: 1.3 10*3/uL (ref 0.9–3.3)

## 2013-01-01 MED ORDER — SODIUM CHLORIDE 0.9 % IV SOLN
800.0000 mg/m2 | Freq: Once | INTRAVENOUS | Status: AC
  Administered 2013-01-01: 1368 mg via INTRAVENOUS
  Filled 2013-01-01: qty 36

## 2013-01-01 MED ORDER — PROCHLORPERAZINE MALEATE 10 MG PO TABS
10.0000 mg | ORAL_TABLET | Freq: Once | ORAL | Status: AC
  Administered 2013-01-01: 10 mg via ORAL

## 2013-01-01 MED ORDER — SODIUM CHLORIDE 0.9 % IJ SOLN
10.0000 mL | INTRAMUSCULAR | Status: DC | PRN
  Administered 2013-01-01: 10 mL
  Filled 2013-01-01: qty 10

## 2013-01-01 MED ORDER — HEPARIN SOD (PORK) LOCK FLUSH 100 UNIT/ML IV SOLN
500.0000 [IU] | Freq: Once | INTRAVENOUS | Status: AC | PRN
  Administered 2013-01-01: 500 [IU]
  Filled 2013-01-01: qty 5

## 2013-01-01 MED ORDER — OMEPRAZOLE 20 MG PO CPDR: 20.0000 mg | DELAYED_RELEASE_CAPSULE | Freq: Every day | ORAL | Status: DC

## 2013-01-01 MED ORDER — SODIUM CHLORIDE 0.9 % IV SOLN
Freq: Once | INTRAVENOUS | Status: AC
  Administered 2013-01-01: 15:00:00 via INTRAVENOUS

## 2013-01-01 NOTE — Progress Notes (Signed)
OFFICE PROGRESS NOTE  CC  Colin Rodriguez, MD [address] 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 He underwent pancreatic code duodenectomy. His final pathology revealed a well to moderately differentiated adenocarcinoma the tumor arose from high grade dysplasia involving small bowel mucosa in the ampulla invaded into the pancreas margins. The margins were negative LV I was noted one lymph node was positive for metastatic disease additional 14 lymph nodes were negative. Portal lymph node resection revealed 5 benign lymph nodes. Pathologically T3 N1.  #2 patient completed adjuvant Gemzar for one cycle prior to start of radiation therapy he tolerated it well.   #3 he completed adjuvant radiation with radiosensitizing Xeloda on 10/31/2012. He tolerated the dose well  #4 began adjuvant single agent Gemzar 3 weeks on one week off beginning 11/20/2012. We'll plan on giving him a total of 3 cycles.  CURRENT THERAPY: Gemzar week 4  INTERVAL HISTORY: Colin Castro 67 y.o. male returns for followup visit he is accompanied by his wife and interpreter.He is doing well has had some abdominal pain. We will give  Him prilosec .He has not had any nausea. He has not had any fevers chills only weakness he is not eating. He has some myalgias. He is tired. He has not had any bleeding. He has no diarrhea. No stomach pains. Remainder of the review of systems is unremarkable.[name]  MEDICAL HISTORY:  Past Medical History  Diagnosis Date  . Hypertension   . High cholesterol   . Coronary artery disease   . Common bile duct (CBD) stricture     Malignant  . 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 Pneumonia   . Constipation   . Myocardial infarction     15 years ago    ALLERGIES:  has No Known Allergies.  MEDICATIONS:  Current Outpatient Prescriptions  Medication Sig Dispense Refill  . acetaminophen (TYLENOL) 500 MG tablet Take 500 mg by mouth every 6 (six) hours as needed. For pain      . hydrOXYzine (ATARAX/VISTARIL) 10 MG tablet Take 10 mg by mouth 2 (two) times daily as needed. For itching      . lidocaine-prilocaine (EMLA) cream Apply topically as needed. Apply to port site 1-2 hours prior to chemotherapy appt.  30 g  3  . lipase/protease/amylase (CREON) 12000 UNITS CPEP Take 2 capsules by mouth 3 (three) times daily before meals.  180 capsule  12  . metoprolol tartrate (LOPRESSOR) 25 MG tablet       . Multiple Vitamin (MULTIVITAMIN WITH MINERALS) TABS Take 1 tablet by mouth daily.      . ondansetron (ZOFRAN) 4 MG tablet Take 4 mg by mouth every 6 (six) hours as needed. For nausea      . pravastatin (PRAVACHOL) 40 MG tablet       . capecitabine (XELODA) 150 MG tablet Take by mouth 2 (two) times daily after a meal.      . capecitabine (XELODA) 500 MG tablet Take 1 tablet (500 mg total) by mouth 2 (two) times daily after a meal. Take Monday - Friday with radiation  60 tablet  2  . ciprofloxacin (CIPRO) 500 MG tablet Take 1 tablet (500 mg total) by mouth 2 (two) times daily.  14 tablet  0  . megestrol (MEGACE) 400 MG/10ML suspension Take 10 mLs (400 mg total) by mouth 2 (two) times daily.  240 mL  3  . omeprazole (PRILOSEC) 20 MG capsule Take 1 capsule (20 mg total) by mouth daily.  30 capsule  6  . polyethylene glycol powder (MIRALAX) powder Take 17 g by mouth daily.  255 g  4  . zolpidem (AMBIEN) 5 MG tablet Take 1 tablet (5 mg total) by mouth at bedtime as needed for sleep.  30 tablet  0   No current facility-administered medications for this visit.   Facility-Administered Medications Ordered in Other Visits  Medication Dose Route Frequency Provider Last Rate Last Dose   . sodium chloride 0.9 % injection 10 mL  10 mL Intravenous PRN Victorino December, MD   10 mL at 10/30/12 1409    SURGICAL HISTORY:  Past Surgical History  Procedure Laterality Date  . Ercp  05/07/2012    Procedure: ENDOSCOPIC RETROGRADE CHOLANGIOPANCREATOGRAPHY (ERCP);  Surgeon: [name], MD;  Location: [facility];  Service: Endoscopy;  Laterality: N/A;  Dr [name]  . Eus  05/07/2012    Procedure: UPPER ENDOSCOPIC ULTRASOUND (EUS) RADIAL;  Surgeon: [name], MD;  Location: [facility];  Service: Endoscopy;;  . Appendectomy      open, 25 yrs ago  . Coronary stent placement      2 stents placed  . Whipple procedure  06/06/2012    Procedure: WHIPPLE PROCEDURE;  Surgeon: [name], MD;  Location: [facility] OR;  Service: General;  Laterality: N/A;  Pancreatico duodenectomy  . Cholecystectomy  06/06/2012    Procedure: CHOLECYSTECTOMY;  Surgeon: [name], MD;  [facility];  Service: General;  Laterality: N/A;  . Pancreatic stent placement  06/06/2012    Procedure: PANCREATIC STENT PLACEMENT;  Surgeon: [name], MD;  Location: [facility] OR;  Service: General;  Laterality: N/A;  Removal of biliary stent; Placement of pacreatic stent  . Lymph node dissection  06/06/2012    Procedure: LYMPH NODE DISSECTION;  Surgeon: [name], MD;  Location: [facility] OR;  Service: General;  Laterality: N/A;  Portal lymph node dissection  . Coronary angioplasty      8 or 9 years ago  . Portacath placement  07/24/2012    Procedure: INSERTION PORT-A-CATH;  Surgeon: [name], MD;  Location: [facility] OR;  Service: General;  Laterality: Left;    REVIEW OF SYSTEMS:   General: fatigue (+), night sweats (-), fever (-), pain (-) Lymph: palpable nodes (-) HEENT: vision changes (-), mucositis (-), gum bleeding (-), epistaxis (-) Cardiovascular: chest pain (-), palpitations (-) Pulmonary: shortness of breath (-), dyspnea on exertion (-), cough (-), hemoptysis (-) GI:  Early satiety (-), melena (-), dysphagia (-),  nausea/vomiting (-), diarrhea (-) GU: dysuria (-), hematuria (-), incontinence (-) Musculoskeletal: joint swelling (-), joint pain (-), back pain (-) Neuro: weakness (-), numbness (-), headache (-), confusion (-) Skin: Rash (-), lesions (-), dryness (-) Psych: depression (-), suicidal/homicidal ideation (-), feeling of hopelessness (-)   PHYSICAL EXAMINATION: Blood pressure 99/64, pulse 65, temperature 97.7 F (36.5 C), temperature source Oral, resp. rate 20, height 5\' 6"  (1.676 m), weight 144 lb 9.6 oz (65.59 kg). Body mass index is 23.35 kg/(m^2). General: Patient is a well appearing male in no acute distress HEENT: PERRLA, sclerae anicteric no conjunctival pallor, MMM Neck: supple, no palpable adenopathy Lungs: clear to auscultation bilaterally, no wheezes, rhonchi, or rales Cardiovascular: tachycardic, regular rhythm, S1, S2, no murmurs,  rubs or gallops Abdomen: Soft, non-distended, normoactive bowel sounds, no HSM, extremely tender to palpation, particularly the RLQ, with rebound.  Patient guarding abdomen when moving on exam table and grimacing while moving.  Extremities: warm and well perfused, no clubbing, cyanosis, or edema Skin: No rashes or lesions Neuro: Non-focal  ECOG PERFORMANCE STATUS: 1 - Symptomatic but completely ambulatory   LABORATORY DATA: Lab Results  Component Value Date   WBC 6.1 01/01/2013   HGB 12.9* 01/01/2013   HCT 37.9* 01/01/2013   MCV 90.4 01/01/2013   PLT 181 01/01/2013      Chemistry      Component Value Date/Time   NA 141 01/01/2013 1204   NA 134* 08/30/2012 0404   K 3.9 01/01/2013 1204   K 4.1 08/30/2012 0404   CL 109* 01/01/2013 1204   CL 106 08/30/2012 0404   CO2 23 01/01/2013 1204   CO2 17* 08/30/2012 0404   BUN 10.7 01/01/2013 1204   BUN 6 08/30/2012 0404   CREATININE 0.8 01/01/2013 1204   CREATININE 0.75 08/30/2012 0404      Component Value Date/Time   CALCIUM 9.0 01/01/2013 1204   CALCIUM 8.4 08/30/2012 0404   ALKPHOS 119 01/01/2013 1204    ALKPHOS 99 08/28/2012 0430   AST 22 01/01/2013 1204   AST 17 08/28/2012 0430   ALT 29 01/01/2013 1204   ALT 29 08/28/2012 0430   BILITOT 0.33 01/01/2013 1204   BILITOT 0.3 08/28/2012 0430       RADIOGRAPHIC STUDIES:    ASSESSMENT: 67 year old gentleman with  #1 periampullary carcinoma stage III patient is status post Whipple procedure overall she's done well with it. You will not receive adjuvant chemotherapy initially consisting of Gemzar for one cycle. He will then go on to receive concomitant chemoradiation with chemotherapy consisting of xeloda. Risks and benefits of treatment have been discussed with them completely.  #2 patient has now completed the initial chemotherapy of one cycle of Gemzar. Unfortunately his course was complicated by development of abdominal pain weakness and fatigue and therefore he was admitted to the hospital with a small bowel obstruction this is now resolved.  #3 patient has completed concurrent radiation therapy and Xeloda. Overall he tolerated it well. He started the radiation 09/24/2012 through 10/31/2012. Concurrently he did receive escalating doses of Xeloda but maximal dose of we got into him was 1500 mg daily Monday through Friday.  #4 patient began adjuvant Gemzar on 11/20/2012. His dosing was thousand milligrams per meter squared. He tolerated it well. He is now here for cycle  4 of Gemzar only.we will dose reduce 800 mg/meter squared.He has had significant myelosuppression so we have reduced his doses.  #5 Abdominal pain    PLAN:  1. Proceed with gemzar today at lower dose  2. Prescribed prilosec 20 mg daily  2. He will return in 1 week for cycle 5  All questions were answered. The patient knows to call the clinic with any problems, questions or concerns. We can certainly see the patient much sooner if necessary.  I spent 25 minutes counseling the patient face to face. The total time spent in the appointment was 30 minutes.  Drue Second,  MD Medical/Oncology [facility] [phone] (beeper) [phone] (Office)  09/08/2012, 11:09 AM

## 2013-01-01 NOTE — Patient Instructions (Addendum)
#  1Proceed Gemzar today. I have reduced the dose to 800 mg per meter squared.  #2 take Prilosec 20 mg on a daily basis for your stomach pains.  #3 I will see you back in one week's time.

## 2013-01-02 ENCOUNTER — Ambulatory Visit: Payer: Medicare Other

## 2013-01-08 ENCOUNTER — Telehealth: Payer: Self-pay | Admitting: Oncology

## 2013-01-08 ENCOUNTER — Ambulatory Visit (HOSPITAL_BASED_OUTPATIENT_CLINIC_OR_DEPARTMENT_OTHER): Payer: Medicare Other | Admitting: Oncology

## 2013-01-08 ENCOUNTER — Ambulatory Visit (HOSPITAL_BASED_OUTPATIENT_CLINIC_OR_DEPARTMENT_OTHER): Payer: Medicaid Other

## 2013-01-08 ENCOUNTER — Encounter: Payer: Self-pay | Admitting: Oncology

## 2013-01-08 ENCOUNTER — Other Ambulatory Visit (HOSPITAL_BASED_OUTPATIENT_CLINIC_OR_DEPARTMENT_OTHER): Payer: Medicare Other | Admitting: Lab

## 2013-01-08 VITALS — BP 103/63 | HR 58 | Temp 97.5°F | Resp 20 | Ht 66.0 in | Wt 144.9 lb

## 2013-01-08 DIAGNOSIS — C241 Malignant neoplasm of ampulla of Vater: Secondary | ICD-10-CM

## 2013-01-08 DIAGNOSIS — E860 Dehydration: Secondary | ICD-10-CM

## 2013-01-08 DIAGNOSIS — Z5111 Encounter for antineoplastic chemotherapy: Secondary | ICD-10-CM

## 2013-01-08 DIAGNOSIS — D696 Thrombocytopenia, unspecified: Secondary | ICD-10-CM

## 2013-01-08 LAB — CBC WITH DIFFERENTIAL/PLATELET
BASO%: 0.6 % (ref 0.0–2.0)
EOS%: 1.2 % (ref 0.0–7.0)
HCT: 36.3 % — ABNORMAL LOW (ref 38.4–49.9)
MCH: 29.9 pg (ref 27.2–33.4)
MCHC: 33.6 g/dL (ref 32.0–36.0)
NEUT%: 44.6 % (ref 39.0–75.0)
RBC: 4.08 10*6/uL — ABNORMAL LOW (ref 4.20–5.82)
RDW: 14.4 % (ref 11.0–14.6)
lymph#: 1.3 10*3/uL (ref 0.9–3.3)

## 2013-01-08 LAB — COMPREHENSIVE METABOLIC PANEL (CC13)
AST: 31 U/L (ref 5–34)
Albumin: 3.2 g/dL — ABNORMAL LOW (ref 3.5–5.0)
Alkaline Phosphatase: 134 U/L (ref 40–150)
BUN: 9.1 mg/dL (ref 7.0–26.0)
Potassium: 4.3 mEq/L (ref 3.5–5.1)

## 2013-01-08 LAB — TECHNOLOGIST REVIEW

## 2013-01-08 MED ORDER — PROCHLORPERAZINE MALEATE 10 MG PO TABS
10.0000 mg | ORAL_TABLET | Freq: Once | ORAL | Status: AC
  Administered 2013-01-08: 10 mg via ORAL

## 2013-01-08 MED ORDER — SODIUM CHLORIDE 0.9 % IJ SOLN
10.0000 mL | INTRAMUSCULAR | Status: DC | PRN
  Administered 2013-01-08: 10 mL
  Filled 2013-01-08: qty 10

## 2013-01-08 MED ORDER — HEPARIN SOD (PORK) LOCK FLUSH 100 UNIT/ML IV SOLN
500.0000 [IU] | Freq: Once | INTRAVENOUS | Status: AC | PRN
  Administered 2013-01-08: 500 [IU]
  Filled 2013-01-08: qty 5

## 2013-01-08 MED ORDER — SODIUM CHLORIDE 0.9 % IV SOLN: Freq: Once | INTRAVENOUS | Status: DC

## 2013-01-08 MED ORDER — SODIUM CHLORIDE 0.9 % IV SOLN
800.0000 mg/m2 | Freq: Once | INTRAVENOUS | Status: AC
  Administered 2013-01-08: 1368 mg via INTRAVENOUS
  Filled 2013-01-08: qty 36

## 2013-01-08 MED ORDER — SODIUM CHLORIDE 0.9 % IV SOLN
Freq: Once | INTRAVENOUS | Status: AC
  Administered 2013-01-08: 12:00:00 via INTRAVENOUS

## 2013-01-08 NOTE — Patient Instructions (Signed)
[facility] Discharge Instructions for Patients Receiving Chemotherapy  Today you received the following chemotherapy agents Gemzar  To help prevent nausea and vomiting after your treatment, we encourage you to take your nausea medication as prescribed.   If you develop nausea and vomiting that is not controlled by your nausea medication, call the clinic. If it is after clinic hours your family physician or the after hours number for the clinic or go to the Emergency Department.   BELOW ARE SYMPTOMS THAT SHOULD BE REPORTED IMMEDIATELY:  *FEVER GREATER THAN 100.5 F  *CHILLS WITH OR WITHOUT FEVER  NAUSEA AND VOMITING THAT IS NOT CONTROLLED WITH YOUR NAUSEA MEDICATION  *UNUSUAL SHORTNESS OF BREATH  *UNUSUAL BRUISING OR BLEEDING  TENDERNESS IN MOUTH AND THROAT WITH OR WITHOUT PRESENCE OF ULCERS  *URINARY PROBLEMS  *BOWEL PROBLEMS  UNUSUAL RASH Items with * indicate a potential emergency and should be followed up as soon as possible.  Feel free to call the clinic you have any questions or concerns. The clinic phone number is [phone].   I have been informed and understand all the instructions given to me. I know to contact the clinic, my physician, or go to the Emergency Department if any problems should occur. I do not have any questions at this time, but understand that I may call the clinic during office hours   should I have any questions or need assistance in obtaining follow up care.    __________________________________________  _____________  __________ Signature of Patient or Authorized Representative            Date                   Time    __________________________________________ Nurse's Signature    

## 2013-01-08 NOTE — Telephone Encounter (Signed)
, °

## 2013-01-09 ENCOUNTER — Other Ambulatory Visit: Payer: Self-pay | Admitting: Certified Registered Nurse Anesthetist

## 2013-01-15 ENCOUNTER — Ambulatory Visit (HOSPITAL_BASED_OUTPATIENT_CLINIC_OR_DEPARTMENT_OTHER): Payer: Medicare Other | Admitting: Oncology

## 2013-01-15 ENCOUNTER — Other Ambulatory Visit (HOSPITAL_BASED_OUTPATIENT_CLINIC_OR_DEPARTMENT_OTHER): Payer: Medicare Other | Admitting: Lab

## 2013-01-15 ENCOUNTER — Ambulatory Visit: Payer: Medicare Other

## 2013-01-15 ENCOUNTER — Telehealth: Payer: Self-pay | Admitting: Oncology

## 2013-01-15 VITALS — BP 100/66 | HR 62 | Temp 97.3°F | Resp 20 | Ht 66.0 in | Wt 146.4 lb

## 2013-01-15 DIAGNOSIS — C241 Malignant neoplasm of ampulla of Vater: Secondary | ICD-10-CM

## 2013-01-15 DIAGNOSIS — D696 Thrombocytopenia, unspecified: Secondary | ICD-10-CM

## 2013-01-15 DIAGNOSIS — D72819 Decreased white blood cell count, unspecified: Secondary | ICD-10-CM

## 2013-01-15 LAB — CBC WITH DIFFERENTIAL/PLATELET
EOS%: 0.6 % (ref 0.0–7.0)
Eosinophils Absolute: 0 10*3/uL (ref 0.0–0.5)
LYMPH%: 33 % (ref 14.0–49.0)
MCH: 30.3 pg (ref 27.2–33.4)
MCV: 89.1 fL (ref 79.3–98.0)
MONO%: 21 % — ABNORMAL HIGH (ref 0.0–14.0)
NEUT#: 1.5 10*3/uL (ref 1.5–6.5)
Platelets: 89 10*3/uL — ABNORMAL LOW (ref 140–400)
RBC: 3.66 10*6/uL — ABNORMAL LOW (ref 4.20–5.82)
nRBC: 1 % — ABNORMAL HIGH (ref 0–0)

## 2013-01-15 NOTE — Telephone Encounter (Signed)
gv pt appt schedule for June and July.  °

## 2013-01-15 NOTE — Patient Instructions (Addendum)
Platelets are low today so be careful of bleeding and cutting yourself  You will be seen back in 1 week for follow up and chemotherapy as long as your blood counts are up.

## 2013-01-22 ENCOUNTER — Other Ambulatory Visit (HOSPITAL_BASED_OUTPATIENT_CLINIC_OR_DEPARTMENT_OTHER): Payer: Medicare Other | Admitting: Lab

## 2013-01-22 ENCOUNTER — Telehealth: Payer: Self-pay | Admitting: *Deleted

## 2013-01-22 ENCOUNTER — Ambulatory Visit (HOSPITAL_BASED_OUTPATIENT_CLINIC_OR_DEPARTMENT_OTHER): Payer: Medicare Other

## 2013-01-22 ENCOUNTER — Ambulatory Visit (HOSPITAL_BASED_OUTPATIENT_CLINIC_OR_DEPARTMENT_OTHER): Payer: Medicare Other | Admitting: Oncology

## 2013-01-22 ENCOUNTER — Ambulatory Visit: Payer: Medicare Other

## 2013-01-22 VITALS — BP 91/59 | HR 59 | Temp 97.0°F | Resp 20 | Ht 66.0 in | Wt 145.6 lb

## 2013-01-22 DIAGNOSIS — C241 Malignant neoplasm of ampulla of Vater: Secondary | ICD-10-CM

## 2013-01-22 DIAGNOSIS — Z5111 Encounter for antineoplastic chemotherapy: Secondary | ICD-10-CM

## 2013-01-22 DIAGNOSIS — E860 Dehydration: Secondary | ICD-10-CM

## 2013-01-22 DIAGNOSIS — D759 Disease of blood and blood-forming organs, unspecified: Secondary | ICD-10-CM

## 2013-01-22 DIAGNOSIS — R109 Unspecified abdominal pain: Secondary | ICD-10-CM

## 2013-01-22 LAB — CBC WITH DIFFERENTIAL/PLATELET
BASO%: 0.2 % (ref 0.0–2.0)
EOS%: 1 % (ref 0.0–7.0)
HCT: 36.8 % — ABNORMAL LOW (ref 38.4–49.9)
LYMPH%: 13.1 % — ABNORMAL LOW (ref 14.0–49.0)
MCH: 30.3 pg (ref 27.2–33.4)
MCHC: 33.4 g/dL (ref 32.0–36.0)
MCV: 90.6 fL (ref 79.3–98.0)
MONO#: 1.5 10*3/uL — ABNORMAL HIGH (ref 0.1–0.9)
MONO%: 31.1 % — ABNORMAL HIGH (ref 0.0–14.0)
NEUT%: 54.6 % (ref 39.0–75.0)
Platelets: 250 10*3/uL (ref 140–400)

## 2013-01-22 LAB — COMPREHENSIVE METABOLIC PANEL (CC13)
AST: 29 U/L (ref 5–34)
BUN: 10.6 mg/dL (ref 7.0–26.0)
Calcium: 8.6 mg/dL (ref 8.4–10.4)
Chloride: 109 mEq/L — ABNORMAL HIGH (ref 98–107)
Creatinine: 0.7 mg/dL (ref 0.7–1.3)

## 2013-01-22 MED ORDER — HEPARIN SOD (PORK) LOCK FLUSH 100 UNIT/ML IV SOLN
500.0000 [IU] | Freq: Once | INTRAVENOUS | Status: AC | PRN
  Administered 2013-01-22: 500 [IU]
  Filled 2013-01-22: qty 5

## 2013-01-22 MED ORDER — SODIUM CHLORIDE 0.9 % IV SOLN
Freq: Once | INTRAVENOUS | Status: AC
  Administered 2013-01-22: 14:00:00 via INTRAVENOUS

## 2013-01-22 MED ORDER — SODIUM CHLORIDE 0.9 % IV SOLN
800.0000 mg/m2 | Freq: Once | INTRAVENOUS | Status: AC
  Administered 2013-01-22: 1368 mg via INTRAVENOUS
  Filled 2013-01-22: qty 36.02

## 2013-01-22 MED ORDER — SODIUM CHLORIDE 0.9 % IJ SOLN
10.0000 mL | INTRAMUSCULAR | Status: DC | PRN
  Administered 2013-01-22: 10 mL
  Filled 2013-01-22: qty 10

## 2013-01-22 MED ORDER — PROCHLORPERAZINE MALEATE 10 MG PO TABS
10.0000 mg | ORAL_TABLET | Freq: Once | ORAL | Status: AC
  Administered 2013-01-22: 10 mg via ORAL

## 2013-01-22 NOTE — Telephone Encounter (Signed)
Per staff message and POF I have scheduled appts.  JMW  

## 2013-01-22 NOTE — Patient Instructions (Addendum)
[facility] Discharge Instructions for Patients Receiving Chemotherapy  Today you received the following chemotherapy agents:  Gemzar  To help prevent nausea and vomiting after your treatment, we encourage you to take your nausea medication as ordered per MD.   If you develop nausea and vomiting that is not controlled by your nausea medication, call the clinic.   BELOW ARE SYMPTOMS THAT SHOULD BE REPORTED IMMEDIATELY:  *FEVER GREATER THAN 100.5 F  *CHILLS WITH OR WITHOUT FEVER  NAUSEA AND VOMITING THAT IS NOT CONTROLLED WITH YOUR NAUSEA MEDICATION  *UNUSUAL SHORTNESS OF BREATH  *UNUSUAL BRUISING OR BLEEDING  TENDERNESS IN MOUTH AND THROAT WITH OR WITHOUT PRESENCE OF ULCERS  *URINARY PROBLEMS  *BOWEL PROBLEMS  UNUSUAL RASH Items with * indicate a potential emergency and should be followed up as soon as possible.  Feel free to call the clinic you have any questions or concerns. The clinic phone number is [phone].    

## 2013-01-26 NOTE — Progress Notes (Signed)
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 He underwent pancreatic code duodenectomy. His final pathology revealed a well to moderately differentiated adenocarcinoma the tumor arose from high grade dysplasia involving small bowel mucosa in the ampulla invaded into the pancreas margins. The margins were negative LV I was noted one lymph node was positive for metastatic disease additional 14 lymph nodes were negative. Portal lymph node resection revealed 5 benign lymph nodes. Pathologically T3 N1.  #2 patient completed adjuvant Gemzar for one cycle prior to start of radiation therapy he tolerated it well.   #3 he completed adjuvant radiation with radiosensitizing Xeloda on 10/31/2012. He tolerated the dose well  #4 began adjuvant single agent Gemzar 3 weeks on one week off beginning 11/20/2012. We'll plan on giving him a total of 3 cycles.  CURRENT THERAPY: Gemzar week 5  INTERVAL HISTORY: Colin Castro 67 y.o. male returns for followup visit he is accompanied by his wife and interpreter.He is doing well has had some abdominal pain. We will give  Him prilosec .He has not had any nausea. He has not had any fevers chills only weakness he is not eating. He has some myalgias. He is tired. He has not had any bleeding. He has no diarrhea. No stomach pains. Remainder of the review of systems is unremarkable.Colin Castro  MEDICAL HISTORY:  Past Medical History  Diagnosis Date  . Hypertension   . High cholesterol   . Coronary artery disease   . Common bile duct (CBD) stricture     Malignant  . 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 Pneumonia   . Constipation   . Myocardial infarction     15 years ago    ALLERGIES:  has No Known Allergies.  MEDICATIONS:  Current Outpatient Prescriptions  Medication Sig Dispense Refill  . acetaminophen (TYLENOL) 500 MG tablet Take 500 mg by mouth every 6 (six) hours as needed. For pain      . lidocaine-prilocaine (EMLA) cream Apply topically as needed. Apply to port site 1-2 hours prior to chemotherapy appt.  30 g  3  . lipase/protease/amylase (CREON) 12000 UNITS CPEP Take 2 capsules by mouth 3 (three) times daily before meals.  180 capsule  12  . megestrol (MEGACE) 400 MG/10ML suspension Take 10 mLs (400 mg total) by mouth 2 (two) times daily.  240 mL  3  . metoprolol tartrate (LOPRESSOR) 25 MG tablet       . Multiple Vitamin (MULTIVITAMIN WITH MINERALS) TABS Take 1 tablet by mouth daily.      . ondansetron (ZOFRAN) 4 MG tablet Take 4 mg by mouth every 6 (six) hours as needed. For nausea      . pravastatin (PRAVACHOL) 40 MG tablet       . capecitabine (XELODA) 150 MG tablet Take by mouth 2 (two) times daily after a meal.      . capecitabine (XELODA) 500 MG tablet Take 1 tablet (500 mg total) by mouth 2 (two) times daily after a meal. Take Monday - Friday with radiation  60 tablet  2  . hydrOXYzine (ATARAX/VISTARIL) 10 MG tablet Take 10 mg by mouth 2 (two) times daily as needed. For  itching      . omeprazole (PRILOSEC) 20 MG capsule Take 1 capsule (20 mg total) by mouth daily.  30 capsule  6  . oxyCODONE-acetaminophen (PERCOCET/ROXICET) 5-325 MG per tablet       . polyethylene glycol powder (MIRALAX) powder Take 17 g by mouth daily.  255 g  4  . zolpidem (AMBIEN) 5 MG tablet Take 1 tablet (5 mg total) by mouth at bedtime as needed for sleep.  30 tablet  0   No current facility-administered medications for this visit.   Facility-Administered Medications Ordered in Other Visits  Medication Dose Route Frequency Provider Last Rate Last Dose  . sodium chloride 0.9 % injection 10 mL   10 mL Intravenous PRN Victorino December, MD   10 mL at 10/30/12 1409    SURGICAL HISTORY:  Past Surgical History  Procedure Laterality Date  . Ercp  05/07/2012    Procedure: ENDOSCOPIC RETROGRADE CHOLANGIOPANCREATOGRAPHY (ERCP);  Surgeon: [name], MD;  Location: [facility];  Service: Endoscopy;  Laterality: N/A;  Dr [name]  . Eus  05/07/2012    Procedure: UPPER ENDOSCOPIC ULTRASOUND (EUS) RADIAL;  Surgeon: [name], MD;  Location: [facility];  Service: Endoscopy;;  . Appendectomy      open, 25 yrs ago  . Coronary stent placement      2 stents placed  . Whipple procedure  06/06/2012    Procedure: WHIPPLE PROCEDURE;  Surgeon: [name], MD;  Location: [facility] OR;  Service: General;  Laterality: N/A;  Pancreatico duodenectomy  . Cholecystectomy  06/06/2012    Procedure: CHOLECYSTECTOMY;  Surgeon: [name], MD;  Location: [facility] OR;  Service: General;  Laterality: N/A;  . Pancreatic stent placement  06/06/2012    Procedure: PANCREATIC STENT PLACEMENT;  Surgeon: [name], MD;  Location: [facility] OR;  Service: General;  Laterality: N/A;  Removal of biliary stent; Placement of pacreatic stent  . Lymph node dissection  06/06/2012    Procedure: LYMPH NODE DISSECTION;  Surgeon: [name], MD;  Location: [facility] OR;  Service: General;  Laterality: N/A;  Portal lymph node dissection  . Coronary angioplasty      8 or 9 years ago  . Portacath placement  07/24/2012    Procedure: INSERTION PORT-A-CATH;  Surgeon: [name], MD;  Location: [facility] OR;  Service: General;  Laterality: Left;    REVIEW OF SYSTEMS:   General: fatigue (+), night sweats (-), fever (-), pain (-) Lymph: palpable nodes (-) HEENT: vision changes (-), mucositis (-), gum bleeding (-), epistaxis (-) Cardiovascular: chest pain (-), palpitations (-) Pulmonary: shortness of breath (-), dyspnea on exertion (-), cough (-), hemoptysis (-) GI:  Early satiety (-), melena (-), dysphagia (-), nausea/vomiting (-), diarrhea (-) GU:  dysuria (-), hematuria (-), incontinence (-) Musculoskeletal: joint swelling (-), joint pain (-), back pain (-) Neuro: weakness (-), numbness (-), headache (-), confusion (-) Skin: Rash (-), lesions (-), dryness (-) Psych: depression (-), suicidal/homicidal ideation (-), feeling of hopelessness (-)   PHYSICAL EXAMINATION: Blood pressure 103/63, pulse 58, temperature 97.5 F (36.4 C), temperature source Oral, resp. rate 20, height 5\' 6"  (1.676 m), weight 144 lb 14.4 oz (65.726 kg). Body mass index is 23.4 kg/(m^2). General: Patient is a well appearing male in no acute distress HEENT: PERRLA, sclerae anicteric no conjunctival pallor, MMM Neck: supple, no palpable adenopathy Lungs: clear to auscultation bilaterally, no wheezes, rhonchi, or rales Cardiovascular: tachycardic, regular rhythm, S1, S2, no murmurs, rubs or gallops Abdomen: Soft, non-distended, normoactive bowel sounds, no  HSM, extremely tender to palpation, particularly the RLQ, with rebound.  Patient guarding abdomen when moving on exam table and grimacing while moving.  Extremities: warm and well perfused, no clubbing, cyanosis, or edema Skin: No rashes or lesions Neuro: Non-focal  ECOG PERFORMANCE STATUS: 1 - Symptomatic but completely ambulatory   LABORATORY DATA: Lab Results  Component Value Date   WBC 4.9 01/22/2013   HGB 12.3* 01/22/2013   HCT 36.8* 01/22/2013   MCV 90.6 01/22/2013   PLT 250 01/22/2013      Chemistry      Component Value Date/Time   NA 138 01/22/2013 1234   NA 134* 08/30/2012 0404   K 4.1 01/22/2013 1234   K 4.1 08/30/2012 0404   CL 109* 01/22/2013 1234   CL 106 08/30/2012 0404   CO2 20* 01/22/2013 1234   CO2 17* 08/30/2012 0404   BUN 10.6 01/22/2013 1234   BUN 6 08/30/2012 0404   CREATININE 0.7 01/22/2013 1234   CREATININE 0.75 08/30/2012 0404      Component Value Date/Time   CALCIUM 8.6 01/22/2013 1234   CALCIUM 8.4 08/30/2012 0404   ALKPHOS 152* 01/22/2013 1234   ALKPHOS 99 08/28/2012 0430   AST 29 01/22/2013 1234    AST 17 08/28/2012 0430   ALT 25 01/22/2013 1234   ALT 29 08/28/2012 0430   BILITOT 0.36 01/22/2013 1234   BILITOT 0.3 08/28/2012 0430       RADIOGRAPHIC STUDIES:    ASSESSMENT: 67 year old gentleman with  #1 periampullary carcinoma stage III patient is status post Whipple procedure overall she's done well with it. You will not receive adjuvant chemotherapy initially consisting of Gemzar for one cycle. He will then go on to receive concomitant chemoradiation with chemotherapy consisting of xeloda. Risks and benefits of treatment have been discussed with them completely.  #2 patient has now completed the initial chemotherapy of one cycle of Gemzar. Unfortunately his course was complicated by development of abdominal pain weakness and fatigue and therefore he was admitted to the hospital with a small bowel obstruction this is now resolved.  #3 patient has completed concurrent radiation therapy and Xeloda. Overall he tolerated it well. He started the radiation 09/24/2012 through 10/31/2012. Concurrently he did receive escalating doses of Xeloda but maximal dose of we got into him was 1500 mg daily Monday through Friday.  #4 patient began adjuvant Gemzar on 11/20/2012. His dosing was thousand milligrams per meter squared. He tolerated it well. He is now here for cycle  4 of Gemzar only.we will dose reduce 800 mg/meter squared.He has had significant myelosuppression so we have reduced his doses.  #5 Abdominal pain resolved    PLAN:  1. Proceed with gemzar today   #2 thrombocytopenia very mild due to chemotherapy.  #3 patient will return in one week's time for cycle 6 of Gemzar adjuvantly.  All questions were answered. The patient knows to call the clinic with any problems, questions or concerns. We can certainly see the patient much sooner if necessary.  I spent 25 minutes counseling the patient face to face. The total time spent in the appointment was 30 minutes.  Drue Second,  MD Medical/Oncology [facility] [phone] (beeper) [phone] (Office)

## 2013-01-29 ENCOUNTER — Ambulatory Visit: Payer: Medicare Other | Admitting: Oncology

## 2013-01-29 ENCOUNTER — Other Ambulatory Visit: Payer: Medicare Other | Admitting: Lab

## 2013-01-29 ENCOUNTER — Other Ambulatory Visit: Payer: Self-pay | Admitting: *Deleted

## 2013-01-29 ENCOUNTER — Ambulatory Visit (HOSPITAL_BASED_OUTPATIENT_CLINIC_OR_DEPARTMENT_OTHER): Payer: Medicare Other

## 2013-01-29 ENCOUNTER — Ambulatory Visit (HOSPITAL_BASED_OUTPATIENT_CLINIC_OR_DEPARTMENT_OTHER): Payer: Medicare Other | Admitting: Oncology

## 2013-01-29 ENCOUNTER — Other Ambulatory Visit (HOSPITAL_BASED_OUTPATIENT_CLINIC_OR_DEPARTMENT_OTHER): Payer: Medicare Other | Admitting: Lab

## 2013-01-29 VITALS — BP 97/61 | HR 57 | Temp 96.9°F | Resp 20 | Ht 66.0 in | Wt 141.2 lb

## 2013-01-29 DIAGNOSIS — C241 Malignant neoplasm of ampulla of Vater: Secondary | ICD-10-CM

## 2013-01-29 DIAGNOSIS — C251 Malignant neoplasm of body of pancreas: Secondary | ICD-10-CM

## 2013-01-29 DIAGNOSIS — Z5111 Encounter for antineoplastic chemotherapy: Secondary | ICD-10-CM

## 2013-01-29 DIAGNOSIS — E860 Dehydration: Secondary | ICD-10-CM

## 2013-01-29 LAB — TECHNOLOGIST REVIEW

## 2013-01-29 LAB — COMPREHENSIVE METABOLIC PANEL (CC13)
ALT: 32 U/L (ref 0–55)
BUN: 10.4 mg/dL (ref 7.0–26.0)
CO2: 22 mEq/L (ref 22–29)
Calcium: 8.8 mg/dL (ref 8.4–10.4)
Chloride: 106 mEq/L (ref 98–107)
Creatinine: 0.8 mg/dL (ref 0.7–1.3)

## 2013-01-29 LAB — CBC WITH DIFFERENTIAL/PLATELET
Eosinophils Absolute: 0 10*3/uL (ref 0.0–0.5)
HCT: 36.9 % — ABNORMAL LOW (ref 38.4–49.9)
LYMPH%: 25.4 % (ref 14.0–49.0)
MONO#: 1 10*3/uL — ABNORMAL HIGH (ref 0.1–0.9)
NEUT#: 2.7 10*3/uL (ref 1.5–6.5)
NEUT%: 54.3 % (ref 39.0–75.0)
Platelets: 197 10*3/uL (ref 140–400)
WBC: 5 10*3/uL (ref 4.0–10.3)

## 2013-01-29 MED ORDER — PROCHLORPERAZINE MALEATE 10 MG PO TABS
10.0000 mg | ORAL_TABLET | Freq: Once | ORAL | Status: AC
  Administered 2013-01-29: 10 mg via ORAL

## 2013-01-29 MED ORDER — HEPARIN SOD (PORK) LOCK FLUSH 100 UNIT/ML IV SOLN
500.0000 [IU] | Freq: Once | INTRAVENOUS | Status: AC | PRN
  Administered 2013-01-29: 500 [IU]
  Filled 2013-01-29: qty 5

## 2013-01-29 MED ORDER — SODIUM CHLORIDE 0.9 % IV SOLN
800.0000 mg/m2 | Freq: Once | INTRAVENOUS | Status: AC
  Administered 2013-01-29: 1368 mg via INTRAVENOUS
  Filled 2013-01-29: qty 36.02

## 2013-01-29 MED ORDER — PANTOPRAZOLE SODIUM 40 MG PO TBEC: 40.0000 mg | DELAYED_RELEASE_TABLET | Freq: Every day | ORAL | Status: DC

## 2013-01-29 MED ORDER — LIDOCAINE-PRILOCAINE 2.5-2.5 % EX CREA: TOPICAL_CREAM | CUTANEOUS | Status: DC | PRN

## 2013-01-29 MED ORDER — SODIUM CHLORIDE 0.9 % IV SOLN
Freq: Once | INTRAVENOUS | Status: AC
  Administered 2013-01-29: 14:00:00 via INTRAVENOUS

## 2013-01-29 MED ORDER — SODIUM CHLORIDE 0.9 % IV SOLN: Freq: Once | INTRAVENOUS | Status: DC

## 2013-01-29 MED ORDER — SODIUM CHLORIDE 0.9 % IJ SOLN
10.0000 mL | INTRAMUSCULAR | Status: DC | PRN
  Administered 2013-01-29: 10 mL
  Filled 2013-01-29: qty 10

## 2013-01-29 NOTE — Patient Instructions (Signed)
[facility] Discharge Instructions for Patients Receiving Chemotherapy  Today you received the following chemotherapy agents Gemzar.  To help prevent nausea and vomiting after your treatment, we encourage you to take your nausea medication as prescribed.   If you develop nausea and vomiting that is not controlled by your nausea medication, call the clinic.   BELOW ARE SYMPTOMS THAT SHOULD BE REPORTED IMMEDIATELY:  *FEVER GREATER THAN 100.5 F  *CHILLS WITH OR WITHOUT FEVER  NAUSEA AND VOMITING THAT IS NOT CONTROLLED WITH YOUR NAUSEA MEDICATION  *UNUSUAL SHORTNESS OF BREATH  *UNUSUAL BRUISING OR BLEEDING  TENDERNESS IN MOUTH AND THROAT WITH OR WITHOUT PRESENCE OF ULCERS  *URINARY PROBLEMS  *BOWEL PROBLEMS  UNUSUAL RASH Items with * indicate a potential emergency and should be followed up as soon as possible.  Feel free to call the clinic you have any questions or concerns. The clinic phone number is [phone].    

## 2013-01-29 NOTE — Patient Instructions (Addendum)
Proceed with gemzar today  Return in 1 week for follow up

## 2013-02-05 ENCOUNTER — Ambulatory Visit (HOSPITAL_BASED_OUTPATIENT_CLINIC_OR_DEPARTMENT_OTHER): Payer: Medicare Other | Admitting: Oncology

## 2013-02-05 ENCOUNTER — Ambulatory Visit (HOSPITAL_BASED_OUTPATIENT_CLINIC_OR_DEPARTMENT_OTHER): Payer: Medicare Other

## 2013-02-05 ENCOUNTER — Other Ambulatory Visit (HOSPITAL_BASED_OUTPATIENT_CLINIC_OR_DEPARTMENT_OTHER): Payer: Medicare Other | Admitting: Lab

## 2013-02-05 ENCOUNTER — Telehealth: Payer: Self-pay | Admitting: *Deleted

## 2013-02-05 ENCOUNTER — Ambulatory Visit (HOSPITAL_COMMUNITY)
Admission: RE | Admit: 2013-02-05 | Discharge: 2013-02-05 | Disposition: A | Discharge status: Home / Self Care | Payer: Medicare Other | Source: Ambulatory Visit | Attending: Oncology | Admitting: Oncology

## 2013-02-05 VITALS — BP 94/63 | HR 62 | Temp 97.0°F | Resp 18 | Ht 66.0 in | Wt 138.0 lb

## 2013-02-05 DIAGNOSIS — R05 Cough: Secondary | ICD-10-CM | POA: Insufficient documentation

## 2013-02-05 DIAGNOSIS — R093 Abnormal sputum: Secondary | ICD-10-CM | POA: Insufficient documentation

## 2013-02-05 DIAGNOSIS — R109 Unspecified abdominal pain: Secondary | ICD-10-CM | POA: Insufficient documentation

## 2013-02-05 DIAGNOSIS — Z95828 Presence of other vascular implants and grafts: Secondary | ICD-10-CM

## 2013-02-05 DIAGNOSIS — E860 Dehydration: Secondary | ICD-10-CM

## 2013-02-05 DIAGNOSIS — R059 Cough, unspecified: Secondary | ICD-10-CM | POA: Insufficient documentation

## 2013-02-05 DIAGNOSIS — R110 Nausea: Secondary | ICD-10-CM | POA: Insufficient documentation

## 2013-02-05 DIAGNOSIS — C241 Malignant neoplasm of ampulla of Vater: Secondary | ICD-10-CM

## 2013-02-05 DIAGNOSIS — J189 Pneumonia, unspecified organism: Secondary | ICD-10-CM

## 2013-02-05 LAB — CBC WITH DIFFERENTIAL/PLATELET
BASO%: 0.3 % (ref 0.0–2.0)
EOS%: 0.2 % (ref 0.0–7.0)
HCT: 35.4 % — ABNORMAL LOW (ref 38.4–49.9)
LYMPH%: 18 % (ref 14.0–49.0)
MCH: 29.9 pg (ref 27.2–33.4)
MCHC: 33.9 g/dL (ref 32.0–36.0)
MONO#: 0.7 10*3/uL (ref 0.1–0.9)
NEUT%: 69.6 % (ref 39.0–75.0)
Platelets: 95 10*3/uL — ABNORMAL LOW (ref 140–400)

## 2013-02-05 LAB — COMPREHENSIVE METABOLIC PANEL (CC13)
AST: 55 U/L — ABNORMAL HIGH (ref 5–34)
BUN: 14 mg/dL (ref 7.0–26.0)
Calcium: 8.9 mg/dL (ref 8.4–10.4)
Chloride: 107 mEq/L (ref 98–107)
Creatinine: 0.8 mg/dL (ref 0.7–1.3)

## 2013-02-05 MED ORDER — MEGESTROL ACETATE 625 MG/5ML PO SUSP: 625.0000 mg | Freq: Every day | ORAL | Status: DC

## 2013-02-05 MED ORDER — SODIUM CHLORIDE 0.9 % IV SOLN
500.0000 mL | Freq: Once | INTRAVENOUS | Status: AC
  Administered 2013-02-05: 1000 mL via INTRAVENOUS

## 2013-02-05 MED ORDER — BENZONATATE 200 MG PO CAPS: 200.0000 mg | ORAL_CAPSULE | Freq: Three times a day (TID) | ORAL | Status: DC | PRN

## 2013-02-05 MED ORDER — AZITHROMYCIN 250 MG PO TABS: ORAL_TABLET | ORAL | Status: DC

## 2013-02-05 MED ORDER — HEPARIN SOD (PORK) LOCK FLUSH 100 UNIT/ML IV SOLN
500.0000 [IU] | Freq: Once | INTRAVENOUS | Status: AC
  Administered 2013-02-05: 500 [IU] via INTRAVENOUS
  Filled 2013-02-05: qty 5

## 2013-02-05 MED ORDER — SODIUM CHLORIDE 0.9 % IJ SOLN
10.0000 mL | INTRAMUSCULAR | Status: DC | PRN
  Administered 2013-02-05: 10 mL via INTRAVENOUS
  Filled 2013-02-05: qty 10

## 2013-02-05 NOTE — Patient Instructions (Addendum)
Proceed with IVF only today  We will hold chemotherapy today due to low paltelets and weakness  Chest x-ray for cough  X-ray of the belly/abdomen for pain evaluation  Azithromycin (antibiotic) take 2 today then one a day for 4 four days  I will see you back in 1 week

## 2013-02-05 NOTE — Telephone Encounter (Signed)
appts made and printed.the patient is aware of his xrays...td

## 2013-02-05 NOTE — Patient Instructions (Addendum)
Dehydration, Adult Dehydration is when you lose more fluids from the body than you take in. Vital organs like the kidneys, brain, and heart cannot function without a proper amount of fluids and salt. Any loss of fluids from the body can cause dehydration.  CAUSES   Vomiting.  Diarrhea.  Excessive sweating.  Excessive urine output.  Fever. SYMPTOMS  Mild dehydration  Thirst.  Dry lips.  Slightly dry mouth. Moderate dehydration  Very dry mouth.  Sunken eyes.  Skin does not bounce back quickly when lightly pinched and released.  Dark urine and decreased urine production.  Decreased tear production.  Headache. Severe dehydration  Very dry mouth.  Extreme thirst.  Rapid, weak pulse (more than 100 beats per minute at rest).  Cold hands and feet.  Not able to sweat in spite of heat and temperature.  Rapid breathing.  Blue lips.  Confusion and lethargy.  Difficulty being awakened.  Minimal urine production.  No tears. DIAGNOSIS  Your caregiver will diagnose dehydration based on your symptoms and your exam. Blood and urine tests will help confirm the diagnosis. The diagnostic evaluation should also identify the cause of dehydration. TREATMENT  Treatment of mild or moderate dehydration can often be done at home by increasing the amount of fluids that you drink. It is best to drink small amounts of fluid more often. Drinking too much at one time can make vomiting worse. Refer to the home care instructions below. Severe dehydration needs to be treated at the hospital where you will probably be given intravenous (IV) fluids that contain water and electrolytes. HOME CARE INSTRUCTIONS   Ask your caregiver about specific rehydration instructions.  Drink enough fluids to keep your urine clear or pale yellow.  Drink small amounts frequently if you have nausea and vomiting.  Eat as you normally do.  Avoid:  Foods or drinks high in sugar.  Carbonated  drinks.  Juice.  Extremely hot or cold fluids.  Drinks with caffeine.  Fatty, greasy foods.  Alcohol.  Tobacco.  Overeating.  Gelatin desserts.  Wash your hands well to avoid spreading bacteria and viruses.  Only take over-the-counter or prescription medicines for pain, discomfort, or fever as directed by your caregiver.  Ask your caregiver if you should continue all prescribed and over-the-counter medicines.  Keep all follow-up appointments with your caregiver. SEEK MEDICAL CARE IF:  You have abdominal pain and it increases or stays in one area (localizes).  You have a rash, stiff neck, or severe headache.  You are irritable, sleepy, or difficult to awaken.  You are weak, dizzy, or extremely thirsty. SEEK IMMEDIATE MEDICAL CARE IF:   You are unable to keep fluids down or you get worse despite treatment.  You have frequent episodes of vomiting or diarrhea.  You have blood or green matter (bile) in your vomit.  You have blood in your stool or your stool looks black and tarry.  You have not urinated in 6 to 8 hours, or you have only urinated a small amount of very dark urine.  You have a fever.  You faint. MAKE SURE YOU:   Understand these instructions.  Will watch your condition.  Will get help right away if you are not doing well or get worse. Document Released: 08/06/2005 Document Revised: 10/29/2011 Document Reviewed: 03/26/2011 [employer] Patient Information 2014 [employer]  

## 2013-02-08 NOTE — Progress Notes (Signed)
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 He underwent pancreatic code duodenectomy. His final pathology revealed a well to moderately differentiated adenocarcinoma the tumor arose from high grade dysplasia involving small bowel mucosa in the ampulla invaded into the pancreas margins. The margins were negative LV I was noted one lymph node was positive for metastatic disease additional 14 lymph nodes were negative. Portal lymph node resection revealed 5 benign lymph nodes. Pathologically T3 N1.  #2 patient completed adjuvant Gemzar for one cycle prior to start of radiation therapy he tolerated it well.   #3 he completed adjuvant radiation with radiosensitizing Xeloda on 10/31/2012. He tolerated the dose well  #4 began adjuvant single agent Gemzar 3 weeks on one week off beginning 11/20/2012. We'll plan on giving him a total of 3 cycles.  CURRENT THERAPY: Gemzar week 6 but we will hold it today  INTERVAL HISTORY: Colin Castro 67 y.o. male returns for followup visit he is accompanied by his wife and interpreter.He  has had some abdominal pain. We will give  Him prilosec .He has not had any nausea. He has not had any fevers chills only weakness he is not eating. He has some myalgias. He is tired. He has not had any bleeding. He has  diarrhea. [name] of the review of systems is unremarkable.[name]  MEDICAL HISTORY:  Past Medical History  Diagnosis Date  . Hypertension   . High cholesterol   . Coronary artery disease   . Common bile duct (CBD) stricture     Malignant  . 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  Pneumonia   . Constipation   . Myocardial infarction     15 years ago    ALLERGIES:  has No Known Allergies.  MEDICATIONS:  Current Outpatient Prescriptions  Medication Sig Dispense Refill  . acetaminophen (TYLENOL) 500 MG tablet Take 500 mg by mouth every 6 (six) hours as needed. For pain      . lidocaine-prilocaine (EMLA) cream Apply topically as needed. Apply to port site 1-2 hours prior to chemotherapy appt.  30 g  3  . lipase/protease/amylase (CREON) 12000 UNITS CPEP Take 2 capsules by mouth 3 (three) times daily before meals.  180 capsule  12  . metoprolol tartrate (LOPRESSOR) 25 MG tablet       . Multiple Vitamin (MULTIVITAMIN WITH MINERALS) TABS Take 1 tablet by mouth daily.      . ondansetron (ZOFRAN) 4 MG tablet Take 4 mg by mouth every 6 (six) hours as needed. For nausea      . polyethylene glycol powder (MIRALAX) powder Take 17 g by mouth daily.  255 g  4  . pravastatin (PRAVACHOL) 40 MG tablet       . zolpidem (AMBIEN) 5 MG tablet Take 1 tablet (5 mg total) by mouth at bedtime as needed for sleep.  30 tablet  0  . azithromycin (ZITHROMAX Z-PAK) 250 MG tablet Take 2 today and then 1 a day for 4 days  6 each  0  . benzonatate (TESSALON) 200 MG capsule Take 1 capsule (200 mg total) by mouth 3 (three) times daily as needed for cough.  20 capsule  3  . lidocaine-prilocaine (EMLA) cream Apply topically as needed. Apply to port 1-2 hours before procedure  30 g  2  . megestrol (MEGACE ES) 625 MG/5ML suspension Take 5 mLs (625 mg total) by mouth daily.  150 mL  6  . oxyCODONE-acetaminophen (PERCOCET/ROXICET) 5-325 MG per tablet       . pantoprazole (PROTONIX) 40 MG tablet Take 1 tablet (40 mg total) by mouth daily.  30 tablet  6   No current facility-administered medications for this visit.   Facility-Administered Medications Ordered in Other Visits  Medication Dose Route Frequency Provider Last Rate Last Dose  . sodium chloride 0.9 % injection 10 mL  10 mL Intravenous PRN Victorino December, MD   10 mL at 10/30/12 1409    SURGICAL HISTORY:  Past Surgical History  Procedure Laterality Date  . Ercp  05/07/2012    Procedure: ENDOSCOPIC RETROGRADE CHOLANGIOPANCREATOGRAPHY (ERCP);  Surgeon: [name], MD;  Location: [facility];  Service: Endoscopy;  Laterality: N/A;  Dr [name]  . Eus  05/07/2012    Procedure: UPPER ENDOSCOPIC ULTRASOUND (EUS) RADIAL;  Surgeon: [name], MD;  Location: [facility];  Service: Endoscopy;;  . Appendectomy      open, 25 yrs ago  . Coronary stent placement      2 stents placed  . Whipple procedure  06/06/2012    Procedure: WHIPPLE PROCEDURE;  Surgeon: [name], MD;  Location: [facility] OR;  Service: General;  Laterality: N/A;  Pancreatico duodenectomy  . Cholecystectomy  06/06/2012    Procedure: CHOLECYSTECTOMY;  Surgeon: [name], MD;  Location: [facility] OR;  Service: General;  Laterality: N/A;  . Pancreatic stent placement  06/06/2012    Procedure: PANCREATIC STENT PLACEMENT;  Surgeon: [name], MD;  Location: [facility] OR;  Service: General;  Laterality: N/A;  Removal of biliary stent; Placement of pacreatic stent  . Lymph node dissection  06/06/2012    Procedure: LYMPH NODE DISSECTION;  Surgeon: [name], MD;  Location: [facility] OR;  Service: General;  Laterality: N/A;  Portal lymph node dissection  . Coronary angioplasty      8 or 9 years ago  . Portacath placement  07/24/2012    Procedure: INSERTION PORT-A-CATH;  Surgeon: [name], MD;  Location: [facility] OR;  Service: General;  Laterality: Left;    REVIEW OF SYSTEMS:   General: fatigue (+), night sweats (-), fever (-), pain (-) Lymph: palpable nodes (-) HEENT: vision changes (-), mucositis (-), gum bleeding (-), epistaxis (-) Cardiovascular: chest pain (-), palpitations (-) Pulmonary: shortness of breath (-), dyspnea on exertion (-), cough (-), hemoptysis (-) GI:  Early satiety (-), melena (-), dysphagia (-), nausea/vomiting (-), diarrhea (-) GU: dysuria (-), hematuria (-),  incontinence (-) Musculoskeletal: joint swelling (-), joint pain (-), back pain (-) Neuro: weakness (-), numbness (-), headache (-), confusion (-) Skin: Rash (-), lesions (-), dryness (-) Psych: depression (-), suicidal/homicidal ideation (-), feeling of hopelessness (-)   PHYSICAL EXAMINATION: Blood pressure 100/66, pulse 62, temperature 97.3 F (36.3 C), temperature source Oral, resp. rate 20, height 5\' 6"  (1.676 m), weight 146 lb 6.4 oz (66.407 kg). Body mass index is 23.64 kg/(m^2). General: Patient is a well appearing male in no acute distress HEENT: PERRLA, sclerae anicteric no conjunctival pallor, MMM Neck: supple, no palpable adenopathy Lungs: clear to auscultation bilaterally, no wheezes, rhonchi, or rales Cardiovascular: tachycardic, regular rhythm, S1, S2, no murmurs, rubs or gallops Abdomen: Soft, non-distended, normoactive bowel sounds, no HSM, extremely tender to palpation,  particularly the RLQ, with rebound.  Patient guarding abdomen when moving on exam table and grimacing while moving.  Extremities: warm and well perfused, no clubbing, cyanosis, or edema Skin: No rashes or lesions Neuro: Non-focal  ECOG PERFORMANCE STATUS: 1 - Symptomatic but completely ambulatory   LABORATORY DATA: Lab Results  Component Value Date   WBC 5.9 02/05/2013   HGB 12.0* 02/05/2013   HCT 35.4* 02/05/2013   MCV 88.3 02/05/2013   PLT 95* 02/05/2013      Chemistry      Component Value Date/Time   NA 138 02/05/2013 1108   NA 134* 08/30/2012 0404   K 3.8 02/05/2013 1108   K 4.1 08/30/2012 0404   CL 107 02/05/2013 1108   CL 106 08/30/2012 0404   CO2 19* 02/05/2013 1108   CO2 17* 08/30/2012 0404   BUN 14.0 02/05/2013 1108   BUN 6 08/30/2012 0404   CREATININE 0.8 02/05/2013 1108   CREATININE 0.75 08/30/2012 0404      Component Value Date/Time   CALCIUM 8.9 02/05/2013 1108   CALCIUM 8.4 08/30/2012 0404   ALKPHOS 136 02/05/2013 1108   ALKPHOS 99 08/28/2012 0430   AST 55* 02/05/2013 1108   AST 17  08/28/2012 0430   ALT 53 02/05/2013 1108   ALT 29 08/28/2012 0430   BILITOT 0.51 02/05/2013 1108   BILITOT 0.3 08/28/2012 0430       RADIOGRAPHIC STUDIES:    ASSESSMENT: 67 year old gentleman with  #1 periampullary carcinoma stage III patient is status post Whipple procedure overall she's done well with it. You will not receive adjuvant chemotherapy initially consisting of Gemzar for one cycle. He will then go on to receive concomitant chemoradiation with chemotherapy consisting of xeloda. Risks and benefits of treatment have been discussed with them completely.  #2 patient has now completed the initial chemotherapy of one cycle of Gemzar. Unfortunately his course was complicated by development of abdominal pain weakness and fatigue and therefore he was admitted to the hospital with a small bowel obstruction this is now resolved.  #3 patient has completed concurrent radiation therapy and Xeloda. Overall he tolerated it well. He started the radiation 09/24/2012 through 10/31/2012. Concurrently he did receive escalating doses of Xeloda but maximal dose of we got into him was 1500 mg daily Monday through Friday.  #4 patient began adjuvant Gemzar on 11/20/2012. His dosing was thousand milligrams per meter squared. He tolerated it well. He is now here for cycle  4 of Gemzar only.we will dose reduce 800 mg/meter squared.He has had significant myelosuppression so we have reduced his doses.  #5 Abdominal pain resolved    PLAN:  1. Hold gemzar today due to leukopenia and thrombocytopenia  #2 he will be given IVF today  #3 patient will return in one week's time for cycle 6 of Gemzar adjuvantly.  All questions were answered. The patient knows to call the clinic with any problems, questions or concerns. We can certainly see the patient much sooner if necessary.  I spent 25 minutes counseling the patient face to face. The total time spent in the appointment was 30 minutes.  Drue Second,  MD Medical/Oncology [facility] [phone] (beeper) [phone] (Office)

## 2013-02-12 ENCOUNTER — Ambulatory Visit (HOSPITAL_BASED_OUTPATIENT_CLINIC_OR_DEPARTMENT_OTHER): Payer: Medicare Other

## 2013-02-12 ENCOUNTER — Other Ambulatory Visit (HOSPITAL_BASED_OUTPATIENT_CLINIC_OR_DEPARTMENT_OTHER): Payer: Medicare Other

## 2013-02-12 ENCOUNTER — Ambulatory Visit (HOSPITAL_BASED_OUTPATIENT_CLINIC_OR_DEPARTMENT_OTHER): Payer: Medicare Other | Admitting: Oncology

## 2013-02-12 ENCOUNTER — Ambulatory Visit: Payer: Medicare Other

## 2013-02-12 VITALS — BP 95/62 | HR 57 | Temp 97.6°F | Resp 20 | Ht 66.0 in | Wt 141.0 lb

## 2013-02-12 DIAGNOSIS — R5381 Other malaise: Secondary | ICD-10-CM

## 2013-02-12 DIAGNOSIS — C241 Malignant neoplasm of ampulla of Vater: Secondary | ICD-10-CM

## 2013-02-12 DIAGNOSIS — C7889 Secondary malignant neoplasm of other digestive organs: Secondary | ICD-10-CM

## 2013-02-12 DIAGNOSIS — E860 Dehydration: Secondary | ICD-10-CM

## 2013-02-12 DIAGNOSIS — Z5111 Encounter for antineoplastic chemotherapy: Secondary | ICD-10-CM

## 2013-02-12 LAB — COMPREHENSIVE METABOLIC PANEL (CC13)
ALT: 16 U/L (ref 0–55)
AST: 19 U/L (ref 5–34)
Albumin: 2.6 g/dL — ABNORMAL LOW (ref 3.5–5.0)
CO2: 22 mEq/L (ref 22–29)
Calcium: 8.1 mg/dL — ABNORMAL LOW (ref 8.4–10.4)
Chloride: 110 mEq/L — ABNORMAL HIGH (ref 98–109)
Potassium: 3.7 mEq/L (ref 3.5–5.1)
Sodium: 137 mEq/L (ref 136–145)
Total Protein: 6.5 g/dL (ref 6.4–8.3)

## 2013-02-12 LAB — CBC WITH DIFFERENTIAL/PLATELET
BASO%: 0.5 % (ref 0.0–2.0)
EOS%: 1.5 % (ref 0.0–7.0)
HCT: 35.9 % — ABNORMAL LOW (ref 38.4–49.9)
MCHC: 33.7 g/dL (ref 32.0–36.0)
MONO#: 1.9 10*3/uL — ABNORMAL HIGH (ref 0.1–0.9)
NEUT%: 51.9 % (ref 39.0–75.0)
RBC: 4 10*6/uL — ABNORMAL LOW (ref 4.20–5.82)
RDW: 16.9 % — ABNORMAL HIGH (ref 11.0–14.6)
WBC: 6 10*3/uL (ref 4.0–10.3)
lymph#: 0.9 10*3/uL (ref 0.9–3.3)

## 2013-02-12 MED ORDER — SODIUM CHLORIDE 0.9 % IJ SOLN
10.0000 mL | INTRAMUSCULAR | Status: DC | PRN
  Administered 2013-02-12: 10 mL
  Filled 2013-02-12: qty 10

## 2013-02-12 MED ORDER — PROCHLORPERAZINE MALEATE 10 MG PO TABS
10.0000 mg | ORAL_TABLET | Freq: Once | ORAL | Status: AC
  Administered 2013-02-12: 10 mg via ORAL

## 2013-02-12 MED ORDER — SODIUM CHLORIDE 0.9 % IV SOLN
800.0000 mg/m2 | Freq: Once | INTRAVENOUS | Status: AC
  Administered 2013-02-12: 1368 mg via INTRAVENOUS
  Filled 2013-02-12: qty 36

## 2013-02-12 MED ORDER — SODIUM CHLORIDE 0.9 % IV SOLN
Freq: Once | INTRAVENOUS | Status: AC
  Administered 2013-02-12: 12:00:00 via INTRAVENOUS

## 2013-02-12 MED ORDER — HEPARIN SOD (PORK) LOCK FLUSH 100 UNIT/ML IV SOLN
500.0000 [IU] | Freq: Once | INTRAVENOUS | Status: AC | PRN
  Administered 2013-02-12: 500 [IU]
  Filled 2013-02-12: qty 5

## 2013-02-12 NOTE — Patient Instructions (Addendum)
Proceed with chemotherapy and IVF today  Return in 1 week for follow up for doctor appointment and chemotherapy

## 2013-02-12 NOTE — Patient Instructions (Addendum)
Cancer Center Discharge Instructions for Patients Receiving Chemotherapy  Today you received the following chemotherapy agents Gemzar.  To help prevent nausea and vomiting after your treatment, we encourage you to take your nausea medication as prescribed.   If you develop nausea and vomiting that is not controlled by your nausea medication, call the clinic.   BELOW ARE SYMPTOMS THAT SHOULD BE REPORTED IMMEDIATELY:  *FEVER GREATER THAN 100.5 F  *CHILLS WITH OR WITHOUT FEVER  NAUSEA AND VOMITING THAT IS NOT CONTROLLED WITH YOUR NAUSEA MEDICATION  *UNUSUAL SHORTNESS OF BREATH  *UNUSUAL BRUISING OR BLEEDING  TENDERNESS IN MOUTH AND THROAT WITH OR WITHOUT PRESENCE OF ULCERS  *URINARY PROBLEMS  *BOWEL PROBLEMS  UNUSUAL RASH Items with * indicate a potential emergency and should be followed up as soon as possible.  Feel free to call the clinic you have any questions or concerns. The clinic phone number is [phone].    

## 2013-02-15 NOTE — Progress Notes (Signed)
OFFICE PROGRESS NOTE  CC  Colin Rodriguez, MD [address] 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 He underwent pancreatic code duodenectomy. His final pathology revealed a well to moderately differentiated adenocarcinoma the tumor arose from high grade dysplasia involving small bowel mucosa in the ampulla invaded into the pancreas margins. The margins were negative LV I was noted one lymph node was positive for metastatic disease additional 14 lymph nodes were negative. Portal lymph node resection revealed 5 benign lymph nodes. Pathologically T3 N1.  #2 patient completed adjuvant Gemzar for one cycle prior to start of radiation therapy he tolerated it well.   #3 he completed adjuvant radiation with radiosensitizing Xeloda on 10/31/2012. He tolerated the dose well  #4 began adjuvant single agent Gemzar 3 weeks on one week off beginning 11/20/2012. We'll plan on giving him a total of 3 cycles.  CURRENT THERAPY: Gemzar week 6 today INTERVAL HISTORY: Colin Castro 68 y.o. male returns for followup visit he is accompanied by his wife and interpreter.He  has had some abdominal pain. His abdominal pain is a little better. .He has not had any nausea. He has not had any fevers chills only weakness he is not eating. He has some myalgias. He is tired. He has not had any bleeding. He has  diarrhea. Colin Castro of the review of systems is unremarkable.[name]  MEDICAL HISTORY:  Past Medical History  Diagnosis Date  . Hypertension   . High cholesterol   . Coronary artery disease   . Common bile duct (CBD) stricture     Malignant  . 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 Pneumonia    . Constipation   . Myocardial infarction     15 years ago    ALLERGIES:  has No Known Allergies.  MEDICATIONS:  Current Outpatient Prescriptions  Medication Sig Dispense Refill  . acetaminophen (TYLENOL) 500 MG tablet Take 500 mg by mouth every 6 (six) hours as needed. For pain      . lidocaine-prilocaine (EMLA) cream Apply topically as needed. Apply to port site 1-2 hours prior to chemotherapy appt.  30 g  3  . lipase/protease/amylase (CREON) 12000 UNITS CPEP Take 2 capsules by mouth 3 (three) times daily before meals.  180 capsule  12  . metoprolol tartrate (LOPRESSOR) 25 MG tablet       . Multiple Vitamin (MULTIVITAMIN WITH MINERALS) TABS Take 1 tablet by mouth daily.      . ondansetron (ZOFRAN) 4 MG tablet Take 4 mg by mouth every 6 (six) hours as needed. For nausea      . oxyCODONE-acetaminophen (PERCOCET/ROXICET) 5-325 MG per tablet       . zolpidem (AMBIEN) 5 MG tablet Take 1 tablet (5 mg total) by mouth at bedtime as needed for sleep.  30 tablet  0  . azithromycin (ZITHROMAX Z-PAK) 250 MG tablet Take 2 today and then 1 a day for 4 days  6 each  0  . benzonatate (TESSALON) 200 MG capsule Take 1 capsule (200 mg total) by mouth 3 (three) times daily as needed for cough.  20 capsule  3  . lidocaine-prilocaine (EMLA) cream Apply topically as needed. Apply to port 1-2 hours before procedure  30 g  2  .  megestrol (MEGACE ES) 625 MG/5ML suspension Take 5 mLs (625 mg total) by mouth daily.  150 mL  6  . omeprazole (PRILOSEC) 20 MG capsule       . pantoprazole (PROTONIX) 40 MG tablet Take 1 tablet (40 mg total) by mouth daily.  30 tablet  6  . polyethylene glycol powder (MIRALAX) powder Take 17 g by mouth daily.  255 g  4  . pravastatin (PRAVACHOL) 40 MG tablet        No current facility-administered medications for this visit.   Facility-Administered Medications Ordered in Other Visits  Medication Dose Route Frequency Provider Last Rate Last Dose  . sodium chloride 0.9 % injection 10  mL  10 mL Intravenous PRN Victorino December, MD   10 mL at 10/30/12 1409    SURGICAL HISTORY:  Past Surgical History  Procedure Laterality Date  . Ercp  05/07/2012    Procedure: ENDOSCOPIC RETROGRADE CHOLANGIOPANCREATOGRAPHY (ERCP);  Surgeon: [name], MD;  Location: [facility];  Service: Endoscopy;  Laterality: N/A;  Dr [name]  . Eus  05/07/2012    Procedure: UPPER ENDOSCOPIC ULTRASOUND (EUS) RADIAL;  Surgeon: [name], MD;  Location: [facility];  Service: Endoscopy;;  . Appendectomy      open, 25 yrs ago  . Coronary stent placement      2 stents placed  . Whipple procedure  06/06/2012    Procedure: WHIPPLE PROCEDURE;  Surgeon: [name], MD;  Location: [facility] OR;  Service: General;  Laterality: N/A;  Pancreatico duodenectomy  . Cholecystectomy  06/06/2012    Procedure: CHOLECYSTECTOMY;  Surgeon: [name], MD;  Location: [facility] OR;  Service: General;  Laterality: N/A;  . Pancreatic stent placement  06/06/2012    Procedure: PANCREATIC STENT PLACEMENT;  Surgeon: [name], MD;  Location: [facility] OR;  Service: General;  Laterality: N/A;  Removal of biliary stent; Placement of pacreatic stent  . Lymph node dissection  06/06/2012    Procedure: LYMPH NODE DISSECTION;  Surgeon: [name], MD;  Location: [facility] OR;  Service: General;  Laterality: N/A;  Portal lymph node dissection  . Coronary angioplasty      8 or 9 years ago  . Portacath placement  07/24/2012    Procedure: INSERTION PORT-A-CATH;  Surgeon: [name], MD;  Location: [facility] OR;  Service: General;  Laterality: Left;    REVIEW OF SYSTEMS:   General: fatigue (+), night sweats (-), fever (-), pain (-) Lymph: palpable nodes (-) HEENT: vision changes (-), mucositis (-), gum bleeding (-), epistaxis (-) Cardiovascular: chest pain (-), palpitations (-) Pulmonary: shortness of breath (-), dyspnea on exertion (-), cough (-), hemoptysis (-) GI:  Early satiety (-), melena (-), dysphagia (-), nausea/vomiting (-), diarrhea (-) GU:  dysuria (-), hematuria (-), incontinence (-) Musculoskeletal: joint swelling (-), joint pain (-), back pain (-) Neuro: weakness (-), numbness (-), headache (-), confusion (-) Skin: Rash (-), lesions (-), dryness (-) Psych: depression (-), suicidal/homicidal ideation (-), feeling of hopelessness (-)   PHYSICAL EXAMINATION: Blood pressure 91/59, pulse 59, temperature 97 F (36.1 C), temperature source Oral, resp. rate 20, height 5\' 6"  (1.676 m), weight 145 lb 9.6 oz (66.044 kg). Body mass index is 23.51 kg/(m^2). General: Patient is a well appearing male in no acute distress HEENT: PERRLA, sclerae anicteric no conjunctival pallor, MMM Neck: supple, no palpable adenopathy Lungs: clear to auscultation bilaterally, no wheezes, rhonchi, or rales Cardiovascular: tachycardic, regular rhythm, S1, S2, no murmurs, rubs or gallops Abdomen: Soft, non-distended, normoactive bowel sounds, no  HSM, extremely tender to palpation, particularly the RLQ, with rebound.  Patient guarding abdomen when moving on exam table and grimacing while moving.  Extremities: warm and well perfused, no clubbing, cyanosis, or edema Skin: No rashes or lesions Neuro: Non-focal  ECOG PERFORMANCE STATUS: 1 - Symptomatic but completely ambulatory   LABORATORY DATA: Lab Results  Component Value Date   WBC 6.0 02/12/2013   HGB 12.1* 02/12/2013   HCT 35.9* 02/12/2013   MCV 89.8 02/12/2013   PLT 352 02/12/2013      Chemistry      Component Value Date/Time   NA 137 02/12/2013 0917   NA 134* 08/30/2012 0404   K 3.7 02/12/2013 0917   K 4.1 08/30/2012 0404   CL 107 02/05/2013 1108   CL 106 08/30/2012 0404   CO2 22 02/12/2013 0917   CO2 17* 08/30/2012 0404   BUN 6.6* 02/12/2013 0917   BUN 6 08/30/2012 0404   CREATININE 0.8 02/12/2013 0917   CREATININE 0.75 08/30/2012 0404      Component Value Date/Time   CALCIUM 8.1* 02/12/2013 0917   CALCIUM 8.4 08/30/2012 0404   ALKPHOS 110 02/12/2013 0917   ALKPHOS 99 08/28/2012 0430   AST 19  02/12/2013 0917   AST 17 08/28/2012 0430   ALT 16 02/12/2013 0917   ALT 29 08/28/2012 0430   BILITOT 0.42 02/12/2013 0917   BILITOT 0.3 08/28/2012 0430       RADIOGRAPHIC STUDIES:    ASSESSMENT: 67 year old gentleman with  #1 periampullary carcinoma stage III patient is status post Whipple procedure overall she's done well with it. You will not receive adjuvant chemotherapy initially consisting of Gemzar for one cycle. He will then go on to receive concomitant chemoradiation with chemotherapy consisting of xeloda. Risks and benefits of treatment have been discussed with them completely.  #2 patient has now completed the initial chemotherapy of one cycle of Gemzar. Unfortunately his course was complicated by development of abdominal pain weakness and fatigue and therefore he was admitted to the hospital with a small bowel obstruction this is now resolved.  #3 patient has completed concurrent radiation therapy and Xeloda. Overall he tolerated it well. He started the radiation 09/24/2012 through 10/31/2012. Concurrently he did receive escalating doses of Xeloda but maximal dose of we got into him was 1500 mg daily Monday through Friday.  #4 patient began adjuvant Gemzar on 11/20/2012. His dosing was thousand milligrams per meter squared. He tolerated it well. He is now here for cycle  4 of Gemzar only.we will dose reduce 800 mg/meter squared.He has had significant myelosuppression so we have reduced his doses.  #5 Abdominal pain resolved    PLAN:  1.proceed with gemzar today this is week 6  #2 patient will return in one week's time for cycle 6 of Gemzar adjuvantly.  3. Encouraged him to take his medications as prescribed.   All questions were answered. The patient knows to call the clinic with any problems, questions or concerns. We can certainly see the patient much sooner if necessary.  I spent 25 minutes counseling the patient face to face. The total time spent in the appointment was 30  minutes.  Drue Second, MD Medical/Oncology [facility] [phone] (beeper) [phone] (Office)

## 2013-02-19 ENCOUNTER — Other Ambulatory Visit: Payer: Medicare Other | Admitting: Lab

## 2013-02-19 ENCOUNTER — Other Ambulatory Visit: Payer: Self-pay | Admitting: Emergency Medicine

## 2013-02-19 ENCOUNTER — Other Ambulatory Visit: Payer: Self-pay | Admitting: Oncology

## 2013-02-19 ENCOUNTER — Ambulatory Visit (HOSPITAL_BASED_OUTPATIENT_CLINIC_OR_DEPARTMENT_OTHER): Payer: Medicare Other | Admitting: Oncology

## 2013-02-19 ENCOUNTER — Ambulatory Visit: Payer: Medicare Other

## 2013-02-19 ENCOUNTER — Encounter: Payer: Self-pay | Admitting: Oncology

## 2013-02-19 ENCOUNTER — Other Ambulatory Visit (HOSPITAL_BASED_OUTPATIENT_CLINIC_OR_DEPARTMENT_OTHER): Payer: Medicare Other | Admitting: Lab

## 2013-02-19 ENCOUNTER — Ambulatory Visit (HOSPITAL_COMMUNITY)
Admission: RE | Admit: 2013-02-19 | Discharge: 2013-02-19 | Disposition: A | Discharge status: Home / Self Care | Payer: Medicare Other | Source: Ambulatory Visit | Attending: Oncology | Admitting: Oncology

## 2013-02-19 ENCOUNTER — Telehealth: Payer: Self-pay | Admitting: Oncology

## 2013-02-19 ENCOUNTER — Ambulatory Visit (HOSPITAL_BASED_OUTPATIENT_CLINIC_OR_DEPARTMENT_OTHER): Payer: Medicare Other

## 2013-02-19 ENCOUNTER — Telehealth: Payer: Self-pay | Admitting: *Deleted

## 2013-02-19 ENCOUNTER — Other Ambulatory Visit: Payer: Self-pay | Admitting: *Deleted

## 2013-02-19 VITALS — BP 102/69 | HR 88 | Temp 97.7°F | Resp 20 | Ht 66.0 in | Wt 135.3 lb

## 2013-02-19 DIAGNOSIS — K4090 Unilateral inguinal hernia, without obstruction or gangrene, not specified as recurrent: Secondary | ICD-10-CM | POA: Insufficient documentation

## 2013-02-19 DIAGNOSIS — E860 Dehydration: Secondary | ICD-10-CM

## 2013-02-19 DIAGNOSIS — Z90411 Acquired partial absence of pancreas: Secondary | ICD-10-CM | POA: Insufficient documentation

## 2013-02-19 DIAGNOSIS — I2510 Atherosclerotic heart disease of native coronary artery without angina pectoris: Secondary | ICD-10-CM | POA: Insufficient documentation

## 2013-02-19 DIAGNOSIS — C241 Malignant neoplasm of ampulla of Vater: Secondary | ICD-10-CM

## 2013-02-19 DIAGNOSIS — R109 Unspecified abdominal pain: Secondary | ICD-10-CM

## 2013-02-19 DIAGNOSIS — R112 Nausea with vomiting, unspecified: Secondary | ICD-10-CM

## 2013-02-19 DIAGNOSIS — K5289 Other specified noninfective gastroenteritis and colitis: Secondary | ICD-10-CM | POA: Insufficient documentation

## 2013-02-19 LAB — CBC WITH DIFFERENTIAL/PLATELET
Basophils Absolute: 0.1 10*3/uL (ref 0.0–0.1)
EOS%: 0.4 % (ref 0.0–7.0)
Eosinophils Absolute: 0 10*3/uL (ref 0.0–0.5)
HCT: 34 % — ABNORMAL LOW (ref 38.4–49.9)
HGB: 11.4 g/dL — ABNORMAL LOW (ref 13.0–17.1)
MCH: 29.8 pg (ref 27.2–33.4)
MONO#: 0.9 10*3/uL (ref 0.1–0.9)
NEUT#: 5 10*3/uL (ref 1.5–6.5)
NEUT%: 74.6 % (ref 39.0–75.0)
RDW: 16.5 % — ABNORMAL HIGH (ref 11.0–14.6)
lymph#: 0.7 10*3/uL — ABNORMAL LOW (ref 0.9–3.3)

## 2013-02-19 LAB — COMPREHENSIVE METABOLIC PANEL (CC13)
Albumin: 2.7 g/dL — ABNORMAL LOW (ref 3.5–5.0)
BUN: 6 mg/dL — ABNORMAL LOW (ref 7.0–26.0)
CO2: 22 mEq/L (ref 22–29)
Calcium: 8.5 mg/dL (ref 8.4–10.4)
Chloride: 105 mEq/L (ref 98–109)
Creatinine: 0.8 mg/dL (ref 0.7–1.3)
Glucose: 74 mg/dl (ref 70–140)
Potassium: 3.6 mEq/L (ref 3.5–5.1)

## 2013-02-19 LAB — TECHNOLOGIST REVIEW

## 2013-02-19 MED ORDER — LOPERAMIDE HCL 2 MG PO CAPS
4.0000 mg | ORAL_CAPSULE | Freq: Once | ORAL | Status: AC
  Administered 2013-02-19: 4 mg via ORAL

## 2013-02-19 MED ORDER — SODIUM CHLORIDE 0.9 % IV SOLN
Freq: Once | INTRAVENOUS | Status: DC
  Administered 2013-02-19: 13:00:00 via INTRAVENOUS

## 2013-02-19 MED ORDER — ONDANSETRON HCL 8 MG PO TABS: 8.0000 mg | ORAL_TABLET | Freq: Three times a day (TID) | ORAL | Status: AC | PRN

## 2013-02-19 MED ORDER — HEPARIN SOD (PORK) LOCK FLUSH 100 UNIT/ML IV SOLN
500.0000 [IU] | Freq: Once | INTRAVENOUS | Status: DC | PRN
  Filled 2013-02-19: qty 5

## 2013-02-19 MED ORDER — DRONABINOL 2.5 MG PO CAPS: 2.5000 mg | ORAL_CAPSULE | Freq: Two times a day (BID) | ORAL | Status: AC

## 2013-02-19 MED ORDER — SODIUM CHLORIDE 0.9 % IJ SOLN
10.0000 mL | INTRAMUSCULAR | Status: DC | PRN
  Filled 2013-02-19: qty 10

## 2013-02-19 MED ORDER — SODIUM CHLORIDE 0.9 % IV SOLN
Freq: Once | INTRAVENOUS | Status: AC
  Administered 2013-02-19: 16:00:00 via INTRAVENOUS

## 2013-02-19 MED ORDER — IOHEXOL 300 MG/ML  SOLN
80.0000 mL | Freq: Once | INTRAMUSCULAR | Status: AC | PRN
  Administered 2013-02-19: 80 mL via INTRAVENOUS

## 2013-02-19 MED ORDER — ONDANSETRON 16 MG/50ML IVPB (CHCC)
16.0000 mg | Freq: Once | INTRAVENOUS | Status: AC
  Administered 2013-02-19: 16 mg via INTRAVENOUS

## 2013-02-19 MED ORDER — PANTOPRAZOLE SODIUM 40 MG PO TBEC: 40.0000 mg | DELAYED_RELEASE_TABLET | Freq: Two times a day (BID) | ORAL | Status: AC

## 2013-02-19 MED ORDER — LORAZEPAM 2 MG/ML IJ SOLN
0.5000 mg | Freq: Once | INTRAMUSCULAR | Status: AC
  Administered 2013-02-19: 2 mg via INTRAVENOUS

## 2013-02-19 NOTE — Patient Instructions (Addendum)
Dehydration, Adult  Dehydration means your body does not have as much fluid as it needs. Your kidneys, brain, and heart will not work properly without the right amount of fluids and salt.   HOME CARE   Ask your doctor how to replace body fluid losses (rehydrate).   Drink enough fluids to keep your pee (urine) clear or pale yellow.   Drink small amounts of fluids often if you feel sick to your stomach (nauseous) or throw up (vomit).   Eat like you normally do.   Avoid:   Foods or drinks high in sugar.   Bubbly (carbonated) drinks.   Juice.   Very hot or cold fluids.   Drinks with caffeine.   Fatty, greasy foods.   Alcohol.   Tobacco.   Eating too much.   Gelatin desserts.   Wash your hands to avoid spreading germs (bacteria, viruses).   Only take medicine as told by your doctor.   Keep all doctor visits as told.  GET HELP RIGHT AWAY IF:    You cannot drink something without throwing up.   You get worse even with treatment.   Your vomit has blood in it or looks greenish.   Your poop (stool) has blood in it or looks black and tarry.   You have not peed in 6 to 8 hours.   You pee a small amount of very dark pee.   You have a fever.   You pass out (faint).   You have belly (abdominal) pain that gets worse or stays in one spot (localizes).   You have a rash, stiff neck, or bad headache.   You get easily annoyed, sleepy, or are hard to wake up.   You feel weak, dizzy, or very thirsty.  MAKE SURE YOU:    Understand these instructions.   Will watch your condition.   Will get help right away if you are not doing well or get worse.  Document Released: 06/02/2009 Document Revised: 10/29/2011 Document Reviewed: 03/26/2011  [employer] Patient Information 2014 [employer]

## 2013-02-19 NOTE — Telephone Encounter (Signed)
Per staff phone call and POF I have schedueld appts.  JMW  

## 2013-02-19 NOTE — Patient Instructions (Addendum)
#  1 nausea vomiting: Please takes Zofran (ondansetron) 8 mg 3 times a day. I have also given you a prescription for Marinol 2.5 mg you can take this once a day in the morning.  #2 please take protonix 40 mg once in the morning and once in the evening  #3  abdominal pain: I have ordered a CT of abdomen and pelvis to evaluate further.  #4 dehydration: I will give you IV fluids with Zofran today and he will return on Saturday, July 5 for IV fluids and Zofran again.  #5 we will hold chemotherapy today.  #6 I will see you back in one week's time for followup. Please call if you continue to have problems.

## 2013-02-19 NOTE — Progress Notes (Signed)
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 He underwent pancreatic code duodenectomy. His final pathology revealed a well to moderately differentiated adenocarcinoma the tumor arose from high grade dysplasia involving small bowel mucosa in the ampulla invaded into the pancreas margins. The margins were negative LV I was noted one lymph node was positive for metastatic disease additional 14 lymph nodes were negative. Portal lymph node resection revealed 5 benign lymph nodes. Pathologically T3 N1.  #2 patient completed adjuvant Gemzar for one cycle prior to start of radiation therapy he tolerated it well.   #3 he completed adjuvant radiation with radiosensitizing Xeloda on 10/31/2012. He tolerated the dose well  #4 began adjuvant single agent Gemzar 3 weeks on one week off beginning 11/20/2012. We'll plan on giving him a total of 3 cycles.  CURRENT THERAPY: Gemzar week 6 today  INTERVAL HISTORY: Colin Castro 67 y.o. male returns for followup visit he is accompanied by his wife and interpreter.[name] Marland KitchenHe has not had any nausea. He has not had any fevers chills only weakness he is not eating. He has some myalgias. He is tired. He has not had any bleeding. He has  diarrhea. [name] of the10 POINT review of systems is unremarkable.[name]  MEDICAL HISTORY:  Past Medical History  Diagnosis Date  . Hypertension   . High cholesterol   . Coronary artery disease   . Common bile duct (CBD) stricture     Malignant  . 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 Pneumonia   . Constipation   . Myocardial infarction     15 years  ago    ALLERGIES:  has No Known Allergies.  MEDICATIONS:  Current Outpatient Prescriptions  Medication Sig Dispense Refill  . acetaminophen (TYLENOL) 500 MG tablet Take 500 mg by mouth every 6 (six) hours as needed. For pain      . lidocaine-prilocaine (EMLA) cream Apply topically as needed. Apply to port site 1-2 hours prior to chemotherapy appt.  30 g  3  . lipase/protease/amylase (CREON) 12000 UNITS CPEP Take 2 capsules by mouth 3 (three) times daily before meals.  180 capsule  12  . metoprolol tartrate (LOPRESSOR) 25 MG tablet       . Multiple Vitamin (MULTIVITAMIN WITH MINERALS) TABS Take 1 tablet by mouth daily.      [name] oxyCODONE-acetaminophen (PERCOCET/ROXICET) 5-325 MG per tablet       . polyethylene glycol powder (MIRALAX) powder Take 17 g by mouth daily.  255 g  4  . pravastatin (PRAVACHOL) 40 MG tablet       . zolpidem (AMBIEN) 5 MG tablet Take 1 tablet (5 mg total) by mouth at bedtime as needed for sleep.  30 tablet  0  . benzonatate (TESSALON) 200 MG capsule Take 1 capsule (200 mg total) by mouth 3 (three) times daily as needed for cough.  20 capsule  3  . dronabinol (MARINOL) 2.5 MG capsule Take 1 capsule (2.5 mg total) by mouth 2 (two) times daily before a meal.  30 capsule  3  . lidocaine-prilocaine (EMLA) cream Apply topically as needed. Apply to port 1-2 hours before procedure  30 g  2  .  megestrol (MEGACE ES) 625 MG/5ML suspension Take 5 mLs (625 mg total) by mouth daily.  150 mL  6  . ondansetron (ZOFRAN) 8 MG tablet Take 1 tablet (8 mg total) by mouth every 8 (eight) hours as needed. For nausea  30 tablet  3  . pantoprazole (PROTONIX) 40 MG tablet Take 1 tablet (40 mg total) by mouth 2 (two) times daily at 10 AM and 5 PM.  60 tablet  6   No current facility-administered medications for this visit.   Facility-Administered Medications Ordered in Other Visits  Medication Dose Route Frequency Provider Last Rate Last Dose  . heparin lock flush 100 unit/mL  500 Units  Intracatheter Once PRN Victorino December, MD      . sodium chloride 0.9 % injection 10 mL  10 mL Intravenous PRN Victorino December, MD   10 mL at 10/30/12 1409  . sodium chloride 0.9 % injection 10 mL  10 mL Intracatheter PRN Victorino December, MD        SURGICAL HISTORY:  Past Surgical History  Procedure Laterality Date  . Ercp  05/07/2012    Procedure: ENDOSCOPIC RETROGRADE CHOLANGIOPANCREATOGRAPHY (ERCP);  Surgeon: [name], MD;  Location: [facility];  Service: Endoscopy;  Laterality: N/A;  Dr [name]  . Eus  05/07/2012    Procedure: UPPER ENDOSCOPIC ULTRASOUND (EUS) RADIAL;  Surgeon: [name], MD;  Location: [facility];  Service: Endoscopy;;  . Appendectomy      open, 25 yrs ago  . Coronary stent placement      2 stents placed  . Whipple procedure  06/06/2012    Procedure: WHIPPLE PROCEDURE;  Surgeon: [name], MD;  Location: [facility] OR;  Service: General;  Laterality: N/A;  Pancreatico duodenectomy  . Cholecystectomy  06/06/2012    Procedure: CHOLECYSTECTOMY;  Surgeon: [name], MD;  Location: [facility] OR;  Service: General;  Laterality: N/A;  . Pancreatic stent placement  06/06/2012    Procedure: PANCREATIC STENT PLACEMENT;  Surgeon: [name], MD;  Location: [facility] OR;  Service: General;  Laterality: N/A;  Removal of biliary stent; Placement of pacreatic stent  . Lymph node dissection  06/06/2012    Procedure: LYMPH NODE DISSECTION;  Surgeon: [name], MD;  Location: [facility] OR;  Service: General;  Laterality: N/A;  Portal lymph node dissection  . Coronary angioplasty      8 or 9 years ago  . Portacath placement  07/24/2012    Procedure: INSERTION PORT-A-CATH;  Surgeon: [name], MD;  Location: [facility] OR;  Service: General;  Laterality: Left;    REVIEW OF SYSTEMS:   General: fatigue (+), night sweats (-), fever (-), pain (-) Lymph: palpable nodes (-) HEENT: vision changes (-), mucositis (-), gum bleeding (-), epistaxis (-) Cardiovascular: chest pain (-), palpitations  (-) Pulmonary: shortness of breath (-), dyspnea on exertion (-), cough (-), hemoptysis (-) GI:  Early satiety (-), melena (-), dysphagia (-), nausea/vomiting (-), diarrhea (-) GU: dysuria (-), hematuria (-), incontinence (-) Musculoskeletal: joint swelling (-), joint pain (-), back pain (-) Neuro: weakness (-), numbness (-), headache (-), confusion (-) Skin: Rash (-), lesions (-), dryness (-) Psych: depression (-), suicidal/homicidal ideation (-), feeling of hopelessness (-)   PHYSICAL EXAMINATION: Blood pressure 97/61, pulse 57, temperature 96.9 F (36.1 C), temperature source Oral, resp. rate 20, height 5\' 6"  (1.676 m), weight 141 lb 3.2 oz (64.048 kg). Body mass index is 22.8 kg/(m^2). General: Patient is a well appearing male in no acute distress HEENT: PERRLA, sclerae  anicteric no conjunctival pallor, MMM Neck: supple, no palpable adenopathy Lungs: clear to auscultation bilaterally, no wheezes, rhonchi, or rales Cardiovascular: tachycardic, regular rhythm, S1, S2, no murmurs, rubs or gallops Abdomen: Soft, non-distended, normoactive bowel sounds, no HSM, extremely tender to palpation, particularly the RLQ, with rebound.  Patient guarding abdomen when moving on exam table and grimacing while moving.  Extremities: warm and well perfused, no clubbing, cyanosis, or edema Skin: No rashes or lesions Neuro: Non-focal  ECOG PERFORMANCE STATUS: 1 - Symptomatic but completely ambulatory   LABORATORY DATA: Lab Results  Component Value Date   WBC 6.7 02/19/2013   HGB 11.4* 02/19/2013   HCT 34.0* 02/19/2013   MCV 89.0 02/19/2013   PLT 249 02/19/2013      Chemistry      Component Value Date/Time   NA 136 02/19/2013 1047   NA 134* 08/30/2012 0404   K 3.6 02/19/2013 1047   K 4.1 08/30/2012 0404   CL 107 02/05/2013 1108   CL 106 08/30/2012 0404   CO2 22 02/19/2013 1047   CO2 17* 08/30/2012 0404   BUN 6.0* 02/19/2013 1047   BUN 6 08/30/2012 0404   CREATININE 0.8 02/19/2013 1047   CREATININE 0.75 08/30/2012  0404      Component Value Date/Time   CALCIUM 8.5 02/19/2013 1047   CALCIUM 8.4 08/30/2012 0404   ALKPHOS 120 02/19/2013 1047   ALKPHOS 99 08/28/2012 0430   AST 73* 02/19/2013 1047   AST 17 08/28/2012 0430   ALT 52 02/19/2013 1047   ALT 29 08/28/2012 0430   BILITOT 0.62 02/19/2013 1047   BILITOT 0.3 08/28/2012 0430       RADIOGRAPHIC STUDIES:    ASSESSMENT: 67 year old gentleman with  #1 periampullary carcinoma stage III patient is status post Whipple procedure overall she's done well with it. You will not receive adjuvant chemotherapy initially consisting of Gemzar for one cycle. He will then go on to receive concomitant chemoradiation with chemotherapy consisting of xeloda. Risks and benefits of treatment have been discussed with them completely.  #2 patient has now completed the initial chemotherapy of one cycle of Gemzar. Unfortunately his course was complicated by development of abdominal pain weakness and fatigue and therefore he was admitted to the hospital with a small bowel obstruction this is now resolved.  #3 patient has completed concurrent radiation therapy and Xeloda. Overall he tolerated it well. He started the radiation 09/24/2012 through 10/31/2012. Concurrently he did receive escalating doses of Xeloda but maximal dose of we got into him was 1500 mg daily Monday through Friday.  #4 patient began adjuvant Gemzar on 11/20/2012. His dosing was thousand milligrams per meter squared. He tolerated it well. He is now here for cycle  4 of Gemzar only.we will dose reduce 800 mg/meter squared.He has had significant myelosuppression so we have reduced his doses.      PLAN:  1.proceed with gemzar today this is week 7  #2 patient will return in one week's time for cycle 8 of Gemzar adjuvantly.  3. Encouraged him to take his medications as prescribed.   All questions were answered. The patient knows to call the clinic with any problems, questions or concerns. We can certainly see the  patient much sooner if necessary.  I spent 25 minutes counseling the patient face to face. The total time spent in the appointment was 30 minutes.  Drue Second, MD Medical/Oncology [facility] [phone] (beeper) [phone] (Office)

## 2013-02-19 NOTE — Progress Notes (Signed)
Chemo treatment cancelled today. IVFs only.

## 2013-02-19 NOTE — Progress Notes (Signed)
OFFICE PROGRESS NOTE  CC  Colin Rodriguez, MD [address] 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 He underwent pancreatic code duodenectomy. His final pathology revealed a well to moderately differentiated adenocarcinoma the tumor arose from high grade dysplasia involving small bowel mucosa in the ampulla invaded into the pancreas margins. The margins were negative LV I was noted one lymph node was positive for metastatic disease additional 14 lymph nodes were negative. Portal lymph node resection revealed 5 benign lymph nodes. Pathologically T3 N1.  #2 patient completed adjuvant Gemzar for one cycle prior to start of radiation therapy he tolerated it well.   #3 he completed adjuvant radiation with radiosensitizing Xeloda on 10/31/2012. He tolerated the dose well  #4 began adjuvant single agent Gemzar 3 weeks on one week off beginning 11/20/2012. We'll plan on giving him a total of 3 cycles.  CURRENT THERAPY: Chemotherapy will be held today. INTERVAL HISTORY: Tyaire Odem 67 y.o. male returns for followup visit to get snacks cycle of chemotherapy. Clinically he seems to have developed more nausea vomiting abdominal pain no fevers chills. His appetite is very poor he has lost weight. He is not eating anything. I've given him a prescription for Megace but it is too expensive for him to take her. We discussed using antiemetics such as Zofran he apparently has run out of it. I do think he may benefit from Marinol as an appetite stimulant as well as an anti-emetic. He is dehydrated I have recommended giving him IV fluids. He is having bowel movements. He is weak tired fatigued.  MEDICAL HISTORY:  Past Medical History  Diagnosis Date  .  Hypertension   . High cholesterol   . Coronary artery disease   . Common bile duct (CBD) stricture     Malignant  . 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 Pneumonia   . Constipation   . Myocardial infarction     15 years ago    ALLERGIES:  has No Known Allergies.  MEDICATIONS:  Current Outpatient Prescriptions  Medication Sig Dispense Refill  . acetaminophen (TYLENOL) 500 MG tablet Take 500 mg by mouth every 6 (six) hours as needed. For pain      . benzonatate (TESSALON) 200 MG capsule Take 1 capsule (200 mg total) by mouth 3 (three) times daily as needed for cough.  20 capsule  3  . lidocaine-prilocaine (EMLA) cream Apply topically as needed. Apply to port site 1-2 hours prior to chemotherapy appt.  30 g  3  . lidocaine-prilocaine (EMLA) cream Apply topically as needed. Apply to port 1-2 hours before procedure  30 g  2  . lipase/protease/amylase (CREON) 12000 UNITS CPEP Take 2 capsules by mouth 3 (three) times daily before meals.  180 capsule  12  . metoprolol tartrate (LOPRESSOR) 25 MG tablet       . Multiple Vitamin (MULTIVITAMIN WITH MINERALS) TABS Take 1 tablet by mouth daily.      . ondansetron (ZOFRAN) 8 MG tablet Take 1 tablet (8 mg total) by mouth every 8 (eight) hours as needed. For nausea  30 tablet  3  . oxyCODONE-acetaminophen (PERCOCET/ROXICET) 5-325 MG per tablet       . pantoprazole (PROTONIX) 40 MG tablet Take 1  tablet (40 mg total) by mouth 2 (two) times daily at 10 AM and 5 PM.  60 tablet  6  . pravastatin (PRAVACHOL) 40 MG tablet       . zolpidem (AMBIEN) 5 MG tablet Take 1 tablet (5 mg total) by mouth at bedtime as needed for sleep.  30 tablet  0  . dronabinol (MARINOL) 2.5 MG capsule Take 1 capsule (2.5 mg total) by mouth 2 (two) times daily before a meal.  30 capsule  3  . megestrol (MEGACE ES) 625 MG/5ML suspension Take 5 mLs (625 mg total) by mouth daily.  150 mL  6  . polyethylene glycol  powder (MIRALAX) powder Take 17 g by mouth daily.  255 g  4   Current Facility-Administered Medications  Medication Dose Route Frequency Provider Last Rate Last Dose  . 0.9 %  sodium chloride infusion   Intravenous Once Victorino December, MD      . ondansetron [facility]) IVPB 16 mg  16 mg Intravenous Once Victorino December, MD       Facility-Administered Medications Ordered in Other Visits  Medication Dose Route Frequency Provider Last Rate Last Dose  . sodium chloride 0.9 % injection 10 mL  10 mL Intravenous PRN Victorino December, MD   10 mL at 10/30/12 1409    SURGICAL HISTORY:  Past Surgical History  Procedure Laterality Date  . Ercp  05/07/2012    Procedure: ENDOSCOPIC RETROGRADE CHOLANGIOPANCREATOGRAPHY (ERCP);  Surgeon: [name], MD;  Location: [facility];  Service: Endoscopy;  Laterality: N/A;  Dr [name]  . Eus  05/07/2012    Procedure: UPPER ENDOSCOPIC ULTRASOUND (EUS) RADIAL;  Surgeon: [name], MD;  Location: [facility];  Service: Endoscopy;;  . Appendectomy      open, 25 yrs ago  . Coronary stent placement      2 stents placed  . Whipple procedure  06/06/2012    Procedure: WHIPPLE PROCEDURE;  Surgeon: [name], MD;  Location: [facility] OR;  Service: General;  Laterality: N/A;  Pancreatico duodenectomy  . Cholecystectomy  06/06/2012    Procedure: CHOLECYSTECTOMY;  Surgeon: [name], MD;  Location: [facility] OR;  Service: General;  Laterality: N/A;  . Pancreatic stent placement  06/06/2012    Procedure: PANCREATIC STENT PLACEMENT;  Surgeon: [name], MD;  Location: [facility] OR;  Service: General;  Laterality: N/A;  Removal of biliary stent; Placement of pacreatic stent  . Lymph node dissection  06/06/2012    Procedure: LYMPH NODE DISSECTION;  Surgeon: [name], MD;  Location: [facility] OR;  Service: General;  Laterality: N/A;  Portal lymph node dissection  . Coronary angioplasty      8 or 9 years ago  . Portacath placement  07/24/2012    Procedure: INSERTION PORT-A-CATH;  Surgeon:  [name], MD;  Location: [facility] OR;  Service: General;  Laterality: Left;    REVIEW OF SYSTEMS:   General: fatigue (+), night sweats (-), fever (-), pain (-) Lymph: palpable nodes (-) HEENT: vision changes (-), mucositis (-), gum bleeding (-), epistaxis (-) Cardiovascular: chest pain (-), palpitations (-) Pulmonary: shortness of breath (-), dyspnea on exertion (-), cough (-), hemoptysis (-) GI:  Early satiety (-), melena (-), dysphagia (-), nausea/vomiting (-), diarrhea (-) GU: dysuria (-), hematuria (-), incontinence (-) Musculoskeletal: joint swelling (-), joint pain (-), back pain (-) Neuro: weakness (-), numbness (-), headache (-), confusion (-) Skin: Rash (-), lesions (-), dryness (-) Psych: depression (-), suicidal/homicidal ideation (-), feeling of hopelessness (-)  PHYSICAL EXAMINATION: Blood pressure 102/69, pulse 88, temperature 97.7 F (36.5 C), temperature source Oral, resp. rate 20, height 5\' 6"  (1.676 m), weight 135 lb 4.8 oz (61.372 kg). Body mass index is 21.85 kg/(m^2). General: Patient is a well appearing male in no acute distress HEENT: PERRLA, sclerae anicteric no conjunctival pallor, MMM Neck: supple, no palpable adenopathy Lungs: clear to auscultation bilaterally, no wheezes, rhonchi, or rales Cardiovascular: tachycardic, regular rhythm, S1, S2, no murmurs, rubs or gallops Abdomen: Soft, non-distended, normoactive bowel sounds, no HSM, extremely tender to palpation, particularly the RLQ, with rebound.  Patient guarding abdomen when moving on exam table and grimacing while moving.  Extremities: warm and well perfused, no clubbing, cyanosis, or edema Skin: No rashes or lesions Neuro: Non-focal  ECOG PERFORMANCE STATUS: 1 - Symptomatic but completely ambulatory   LABORATORY DATA: Lab Results  Component Value Date   WBC 6.7 02/19/2013   HGB 11.4* 02/19/2013   HCT 34.0* 02/19/2013   MCV 89.0 02/19/2013   PLT 249 02/19/2013      Chemistry      Component Value  Date/Time   NA 137 02/12/2013 0917   NA 134* 08/30/2012 0404   K 3.7 02/12/2013 0917   K 4.1 08/30/2012 0404   CL 107 02/05/2013 1108   CL 106 08/30/2012 0404   CO2 22 02/12/2013 0917   CO2 17* 08/30/2012 0404   BUN 6.6* 02/12/2013 0917   BUN 6 08/30/2012 0404   CREATININE 0.8 02/12/2013 0917   CREATININE 0.75 08/30/2012 0404      Component Value Date/Time   CALCIUM 8.1* 02/12/2013 0917   CALCIUM 8.4 08/30/2012 0404   ALKPHOS 110 02/12/2013 0917   ALKPHOS 99 08/28/2012 0430   AST 19 02/12/2013 0917   AST 17 08/28/2012 0430   ALT 16 02/12/2013 0917   ALT 29 08/28/2012 0430   BILITOT 0.42 02/12/2013 0917   BILITOT 0.3 08/28/2012 0430       RADIOGRAPHIC STUDIES:    ASSESSMENT: 67 year old gentleman with  #1 periampullary carcinoma stage III patient is status post Whipple procedure overall she's done well with it. You will not receive adjuvant chemotherapy initially consisting of Gemzar for one cycle. He will then go on to receive concomitant chemoradiation with chemotherapy consisting of xeloda. Risks and benefits of treatment have been discussed with them completely.  #2 patient has now completed the initial chemotherapy of one cycle of Gemzar. Unfortunately his course was complicated by development of abdominal pain weakness and fatigue and therefore he was admitted to the hospital with a small bowel obstruction this is now resolved.  #3 patient has completed concurrent radiation therapy and Xeloda. Overall he tolerated it well. He started the radiation 09/24/2012 through 10/31/2012. Concurrently he did receive escalating doses of Xeloda but maximal dose of we got into him was 1500 mg daily Monday through Friday.  #4 patient began adjuvant Gemzar on 11/20/2012. His dosing was thousand milligrams per meter squared. He tolerated it well. He is now here for cycle  4 of Gemzar only.we will dose reduce 800 mg/meter squared.He has had significant myelosuppression so we have reduced his doses.  #5 Abdominal  pain persistent and recurring associated with nausea and vomiting  #6 dehydration  #7 malnutrition     PLAN:  #1 patient continues with abdominal pain I worry about obstruction. I have recommended CT of the abdomen. The KUB did not show any evidence of obstruction.  #2 nausea vomiting: Recommended continuing Zofran added Marinol.  #3 abdominal pain  CT of the abdomen as ordered. I've also recommended protonix 40 mg twice a day.  #4 dehydration: IV fluids with Zofran today and on Saturday, July  fifth.  #5 patient will return to see me in one week's time or sooner if need arises.  All questions were answered. The patient knows to call the clinic with any problems, questions or concerns. We can certainly see the patient much sooner if necessary.  I spent 25 minutes counseling the patient face to face. The total time spent in the appointment was 30 minutes.  Drue Second, MD Medical/Oncology [facility] [phone] (beeper) [phone] (Office)

## 2013-02-21 ENCOUNTER — Ambulatory Visit (HOSPITAL_BASED_OUTPATIENT_CLINIC_OR_DEPARTMENT_OTHER): Payer: Medicare Other

## 2013-02-21 DIAGNOSIS — E860 Dehydration: Secondary | ICD-10-CM

## 2013-02-21 DIAGNOSIS — R112 Nausea with vomiting, unspecified: Secondary | ICD-10-CM

## 2013-02-21 DIAGNOSIS — C241 Malignant neoplasm of ampulla of Vater: Secondary | ICD-10-CM

## 2013-02-21 MED ORDER — HEPARIN SOD (PORK) LOCK FLUSH 100 UNIT/ML IV SOLN
500.0000 [IU] | Freq: Once | INTRAVENOUS | Status: AC
  Administered 2013-02-21: 500 [IU] via INTRAVENOUS
  Filled 2013-02-21: qty 5

## 2013-02-21 MED ORDER — SODIUM CHLORIDE 0.9 % IJ SOLN
10.0000 mL | INTRAMUSCULAR | Status: DC | PRN
  Administered 2013-02-21: 10 mL via INTRAVENOUS
  Filled 2013-02-21: qty 10

## 2013-02-21 MED ORDER — SODIUM CHLORIDE 0.9 % IV SOLN
Freq: Once | INTRAVENOUS | Status: AC
  Administered 2013-02-21: 10:00:00 via INTRAVENOUS

## 2013-02-21 NOTE — Patient Instructions (Addendum)
Dehydration, Adult Dehydration is when you lose more fluids from the body than you take in. Vital organs like the kidneys, brain, and heart cannot function without a proper amount of fluids and salt. Any loss of fluids from the body can cause dehydration.  CAUSES   Vomiting.  Diarrhea.  Excessive sweating.  Excessive urine output.  Fever. SYMPTOMS  Mild dehydration  Thirst.  Dry lips.  Slightly dry mouth. Moderate dehydration  Very dry mouth.  Sunken eyes.  Skin does not bounce back quickly when lightly pinched and released.  Dark urine and decreased urine production.  Decreased tear production.  Headache. Severe dehydration  Very dry mouth.  Extreme thirst.  Rapid, weak pulse (more than 100 beats per minute at rest).  Cold hands and feet.  Not able to sweat in spite of heat and temperature.  Rapid breathing.  Blue lips.  Confusion and lethargy.  Difficulty being awakened.  Minimal urine production.  No tears. DIAGNOSIS  Your caregiver will diagnose dehydration based on your symptoms and your exam. Blood and urine tests will help confirm the diagnosis. The diagnostic evaluation should also identify the cause of dehydration. TREATMENT  Treatment of mild or moderate dehydration can often be done at home by increasing the amount of fluids that you drink. It is best to drink small amounts of fluid more often. Drinking too much at one time can make vomiting worse. Refer to the home care instructions below. Severe dehydration needs to be treated at the hospital where you will probably be given intravenous (IV) fluids that contain water and electrolytes. HOME CARE INSTRUCTIONS   Ask your caregiver about specific rehydration instructions.  Drink enough fluids to keep your urine clear or pale yellow.  Drink small amounts frequently if you have nausea and vomiting.  Eat as you normally do.  Avoid:  Foods or drinks high in sugar.  Carbonated  drinks.  Juice.  Extremely hot or cold fluids.  Drinks with caffeine.  Fatty, greasy foods.  Alcohol.  Tobacco.  Overeating.  Gelatin desserts.  Wash your hands well to avoid spreading bacteria and viruses.  Only take over-the-counter or prescription medicines for pain, discomfort, or fever as directed by your caregiver.  Ask your caregiver if you should continue all prescribed and over-the-counter medicines.  Keep all follow-up appointments with your caregiver. SEEK MEDICAL CARE IF:  You have abdominal pain and it increases or stays in one area (localizes).  You have a rash, stiff neck, or severe headache.  You are irritable, sleepy, or difficult to awaken.  You are weak, dizzy, or extremely thirsty. SEEK IMMEDIATE MEDICAL CARE IF:   You are unable to keep fluids down or you get worse despite treatment.  You have frequent episodes of vomiting or diarrhea.  You have blood or green matter (bile) in your vomit.  You have blood in your stool or your stool looks black and tarry.  You have not urinated in 6 to 8 hours, or you have only urinated a small amount of very dark urine.  You have a fever.  You faint. MAKE SURE YOU:   Understand these instructions.  Will watch your condition.  Will get help right away if you are not doing well or get worse. Document Released: 08/06/2005 Document Revised: 10/29/2011 Document Reviewed: 03/26/2011 [employer] Patient Information 2014 [employer]  

## 2013-02-23 ENCOUNTER — Other Ambulatory Visit: Payer: Self-pay | Admitting: Certified Registered Nurse Anesthetist

## 2013-02-23 NOTE — Progress Notes (Signed)
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 He underwent pancreatic code duodenectomy. His final pathology revealed a well to moderately differentiated adenocarcinoma the tumor arose from high grade dysplasia involving small bowel mucosa in the ampulla invaded into the pancreas margins. The margins were negative LV I was noted one lymph node was positive for metastatic disease additional 14 lymph nodes were negative. Portal lymph node resection revealed 5 benign lymph nodes. Pathologically T3 N1.  #2 patient completed adjuvant Gemzar for one cycle prior to start of radiation therapy he tolerated it well.   #3 he completed adjuvant radiation with radiosensitizing Xeloda on 10/31/2012. He tolerated the dose well  #4 began adjuvant single agent Gemzar 3 weeks on one week off beginning 11/20/2012. We'll plan on giving him a total of 3 cycles.  CURRENT THERAPY: Gemzar week 9 today  INTERVAL HISTORY: Colin Castro 67 y.o. male returns for followup visit he is accompanied by his wife and interpreter.[name] Marland KitchenHe has not had any nausea. He has not had any fevers chills only weakness he is not eating. He has some myalgias. He is tired. He has not had any bleeding. He has  diarrhea. Colin Castro of [facility] [facility] review of systems is unremarkable.[name]  MEDICAL HISTORY:  Past Medical History  Diagnosis Date  . Hypertension   . High cholesterol   . Coronary artery disease   . Common bile duct (CBD) stricture     Malignant  . 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 Pneumonia   . Constipation   . Myocardial infarction     15 years  ago    ALLERGIES:  has No Known Allergies.  MEDICATIONS:  Current Outpatient Prescriptions  Medication Sig Dispense Refill  . acetaminophen (TYLENOL) 500 MG tablet Take 500 mg by mouth every 6 (six) hours as needed. For pain      . benzonatate (TESSALON) 200 MG capsule Take 1 capsule (200 mg total) by mouth 3 (three) times daily as needed for cough.  20 capsule  3  . lipase/protease/amylase (CREON) 12000 UNITS CPEP Take 2 capsules by mouth 3 (three) times daily before meals.  180 capsule  12  . metoprolol tartrate (LOPRESSOR) 25 MG tablet       . Multiple Vitamin (MULTIVITAMIN WITH MINERALS) TABS Take 1 tablet by mouth daily.      [name] oxyCODONE-acetaminophen (PERCOCET/ROXICET) 5-325 MG per tablet       . dronabinol (MARINOL) 2.5 MG capsule Take 1 capsule (2.5 mg total) by mouth 2 (two) times daily before a meal.  30 capsule  3  . lidocaine-prilocaine (EMLA) cream Apply topically as needed. Apply to port site 1-2 hours prior to chemotherapy appt.  30 g  3  . lidocaine-prilocaine (EMLA) cream Apply topically as needed. Apply to port 1-2 hours before procedure  30 g  2  . megestrol (MEGACE ES) 625 MG/5ML suspension Take 5 mLs (625 mg total) by mouth daily.  150 mL  6  . ondansetron (ZOFRAN) 8 MG tablet Take 1 tablet (8 mg total) by mouth every 8 (eight) hours as needed. For nausea  30 tablet  3  . pantoprazole (PROTONIX) 40 MG tablet Take  1 tablet (40 mg total) by mouth 2 (two) times daily at 10 AM and 5 PM.  60 tablet  6  . polyethylene glycol powder (MIRALAX) powder Take 17 g by mouth daily.  255 g  4  . pravastatin (PRAVACHOL) 40 MG tablet       . zolpidem (AMBIEN) 5 MG tablet Take 1 tablet (5 mg total) by mouth at bedtime as needed for sleep.  30 tablet  0   No current facility-administered medications for this visit.   Facility-Administered Medications Ordered in Other Visits  Medication Dose Route Frequency Provider Last Rate Last Dose  . sodium chloride 0.9 % injection 10 mL  10 mL  Intravenous PRN Victorino December, MD   10 mL at 10/30/12 1409    SURGICAL HISTORY:  Past Surgical History  Procedure Laterality Date  . Ercp  05/07/2012    Procedure: ENDOSCOPIC RETROGRADE CHOLANGIOPANCREATOGRAPHY (ERCP);  Surgeon: [name], MD;  Location: [facility];  Service: Endoscopy;  Laterality: N/A;  Dr [name]  . Eus  05/07/2012    Procedure: UPPER ENDOSCOPIC ULTRASOUND (EUS) RADIAL;  Surgeon: [name], MD;  Location: [facility];  Service: Endoscopy;;  . Appendectomy      open, 25 yrs ago  . Coronary stent placement      2 stents placed  . Whipple procedure  06/06/2012    Procedure: WHIPPLE PROCEDURE;  Surgeon: [name], MD;  Location: [facility] OR;  Service: General;  Laterality: N/A;  Pancreatico duodenectomy  . Cholecystectomy  06/06/2012    Procedure: CHOLECYSTECTOMY;  Surgeon: [name], MD;  Location: [facility] OR;  Service: General;  Laterality: N/A;  . Pancreatic stent placement  06/06/2012    Procedure: PANCREATIC STENT PLACEMENT;  Surgeon: [name], MD;  Location: [facility] OR;  Service: General;  Laterality: N/A;  Removal of biliary stent; Placement of pacreatic stent  . Lymph node dissection  06/06/2012    Procedure: LYMPH NODE DISSECTION;  Surgeon: [name], MD;  Location: [facility] OR;  Service: General;  Laterality: N/A;  Portal lymph node dissection  . Coronary angioplasty      8 or 9 years ago  . Portacath placement  07/24/2012    Procedure: INSERTION PORT-A-CATH;  Surgeon: [name], MD;  Location: [facility] OR;  Service: General;  Laterality: Left;    REVIEW OF SYSTEMS:   General: fatigue (+), night sweats (-), fever (-), pain (-) Lymph: palpable nodes (-) HEENT: vision changes (-), mucositis (-), gum bleeding (-), epistaxis (-) Cardiovascular: chest pain (-), palpitations (-) Pulmonary: shortness of breath (-), dyspnea on exertion (-), cough (-), hemoptysis (-) GI:  Early satiety (-), melena (-), dysphagia (-), nausea/vomiting (-), diarrhea (-) GU: dysuria  (-), hematuria (-), incontinence (-) Musculoskeletal: joint swelling (-), joint pain (-), back pain (-) Neuro: weakness (-), numbness (-), headache (-), confusion (-) Skin: Rash (-), lesions (-), dryness (-) Psych: depression (-), suicidal/homicidal ideation (-), feeling of hopelessness (-)   PHYSICAL EXAMINATION: Blood pressure 95/62, pulse 57, temperature 97.6 F (36.4 C), temperature source Oral, resp. rate 20, height 5\' 6"  (1.676 m), weight 141 lb (63.957 kg). Body mass index is 22.77 kg/(m^2). General: Patient is a well appearing male in no acute distress HEENT: PERRLA, sclerae anicteric no conjunctival pallor, MMM Neck: supple, no palpable adenopathy Lungs: clear to auscultation bilaterally, no wheezes, rhonchi, or rales Cardiovascular: tachycardic, regular rhythm, S1, S2, no murmurs, rubs or gallops Abdomen: Soft, non-distended, normoactive bowel sounds, no HSM, extremely tender to palpation, particularly the  RLQ, with rebound.  Patient guarding abdomen when moving on exam table and grimacing while moving.  Extremities: warm and well perfused, no clubbing, cyanosis, or edema Skin: No rashes or lesions Neuro: Non-focal  ECOG PERFORMANCE STATUS: 1 - Symptomatic but completely ambulatory   LABORATORY DATA: Lab Results  Component Value Date   WBC 6.7 02/19/2013   HGB 11.4* 02/19/2013   HCT 34.0* 02/19/2013   MCV 89.0 02/19/2013   PLT 249 02/19/2013      Chemistry      Component Value Date/Time   NA 136 02/19/2013 1047   NA 134* 08/30/2012 0404   K 3.6 02/19/2013 1047   K 4.1 08/30/2012 0404   CL 107 02/05/2013 1108   CL 106 08/30/2012 0404   CO2 22 02/19/2013 1047   CO2 17* 08/30/2012 0404   BUN 6.0* 02/19/2013 1047   BUN 6 08/30/2012 0404   CREATININE 0.8 02/19/2013 1047   CREATININE 0.75 08/30/2012 0404      Component Value Date/Time   CALCIUM 8.5 02/19/2013 1047   CALCIUM 8.4 08/30/2012 0404   ALKPHOS 120 02/19/2013 1047   ALKPHOS 99 08/28/2012 0430   AST 73* 02/19/2013 1047   AST 17  08/28/2012 0430   ALT 52 02/19/2013 1047   ALT 29 08/28/2012 0430   BILITOT 0.62 02/19/2013 1047   BILITOT 0.3 08/28/2012 0430       RADIOGRAPHIC STUDIES:    ASSESSMENT: 67 year old gentleman with  #1 periampullary carcinoma stage III patient is status post Whipple procedure overall she's done well with it. You will not receive adjuvant chemotherapy initially consisting of Gemzar for one cycle. He will then go on to receive concomitant chemoradiation with chemotherapy consisting of xeloda. Risks and benefits of treatment have been discussed with them completely.  #2 patient has now completed the initial chemotherapy of one cycle of Gemzar. Unfortunately his course was complicated by development of abdominal pain weakness and fatigue and therefore he was admitted to the hospital with a small bowel obstruction this is now resolved.  #3 patient has completed concurrent radiation therapy and Xeloda. Overall he tolerated it well. He started the radiation 09/24/2012 through 10/31/2012. Concurrently he did receive escalating doses of Xeloda but maximal dose of we got into him was 1500 mg daily Monday through Friday.  #4 patient began adjuvant Gemzar on 11/20/2012. His dosing was thousand milligrams per meter squared. He tolerated it well. He is now here for cycle  4 of Gemzar only.we will dose reduce 800 mg/meter squared.He has had significant myelosuppression so we have reduced his doses.      PLAN:  Proceed with chemotherapy and IVF today  Return in 1 week for follow up for doctor appointment and chemotherapy  All questions were answered. The patient knows to call the clinic with any problems, questions or concerns. We can certainly see the patient much sooner if necessary.  I spent 25 minutes counseling the patient face to face. The total time spent in the appointment was 30 minutes.  Drue Second, MD Medical/Oncology [facility] [phone] (beeper) [phone]  (Office)

## 2013-02-25 ENCOUNTER — Other Ambulatory Visit: Payer: Self-pay | Admitting: Medical Oncology

## 2013-02-25 DIAGNOSIS — C241 Malignant neoplasm of ampulla of Vater: Secondary | ICD-10-CM

## 2013-02-26 ENCOUNTER — Encounter: Payer: Self-pay | Admitting: Oncology

## 2013-02-26 ENCOUNTER — Encounter: Payer: Self-pay | Admitting: *Deleted

## 2013-02-26 ENCOUNTER — Ambulatory Visit (HOSPITAL_BASED_OUTPATIENT_CLINIC_OR_DEPARTMENT_OTHER): Payer: Medicare Other | Admitting: Oncology

## 2013-02-26 ENCOUNTER — Other Ambulatory Visit (HOSPITAL_BASED_OUTPATIENT_CLINIC_OR_DEPARTMENT_OTHER): Payer: Medicare Other | Admitting: Lab

## 2013-02-26 ENCOUNTER — Ambulatory Visit (HOSPITAL_BASED_OUTPATIENT_CLINIC_OR_DEPARTMENT_OTHER): Payer: Medicare Other

## 2013-02-26 VITALS — BP 95/61 | HR 61 | Temp 97.2°F | Resp 20 | Ht 66.0 in | Wt 135.3 lb

## 2013-02-26 DIAGNOSIS — E860 Dehydration: Secondary | ICD-10-CM

## 2013-02-26 DIAGNOSIS — C241 Malignant neoplasm of ampulla of Vater: Secondary | ICD-10-CM

## 2013-02-26 LAB — CBC WITH DIFFERENTIAL/PLATELET
Eosinophils Absolute: 0.1 10*3/uL (ref 0.0–0.5)
HCT: 35.9 % — ABNORMAL LOW (ref 38.4–49.9)
LYMPH%: 23.1 % (ref 14.0–49.0)
MCV: 90 fL (ref 79.3–98.0)
MONO%: 26.8 % — ABNORMAL HIGH (ref 0.0–14.0)
NEUT#: 2.8 10*3/uL (ref 1.5–6.5)
NEUT%: 47.9 % (ref 39.0–75.0)
Platelets: 188 10*3/uL (ref 140–400)
RBC: 3.99 10*6/uL — ABNORMAL LOW (ref 4.20–5.82)
nRBC: 0 % (ref 0–0)

## 2013-02-26 LAB — COMPREHENSIVE METABOLIC PANEL (CC13)
ALT: 23 U/L (ref 0–55)
BUN: 6.9 mg/dL — ABNORMAL LOW (ref 7.0–26.0)
CO2: 23 mEq/L (ref 22–29)
Creatinine: 0.8 mg/dL (ref 0.7–1.3)
Glucose: 95 mg/dl (ref 70–140)
Total Bilirubin: 0.71 mg/dL (ref 0.20–1.20)

## 2013-02-26 MED ORDER — METRONIDAZOLE 500 MG PO TABS: 500.0000 mg | ORAL_TABLET | Freq: Three times a day (TID) | ORAL | Status: DC

## 2013-02-26 MED ORDER — HEPARIN SOD (PORK) LOCK FLUSH 100 UNIT/ML IV SOLN
500.0000 [IU] | Freq: Once | INTRAVENOUS | Status: AC
  Administered 2013-02-26: 500 [IU] via INTRAVENOUS
  Filled 2013-02-26: qty 5

## 2013-02-26 MED ORDER — SODIUM CHLORIDE 0.9 % IJ SOLN
10.0000 mL | INTRAMUSCULAR | Status: DC | PRN
  Administered 2013-02-26: 10 mL via INTRAVENOUS
  Filled 2013-02-26: qty 10

## 2013-02-26 MED ORDER — SODIUM CHLORIDE 0.9 % IV SOLN
1000.0000 mL | Freq: Once | INTRAVENOUS | Status: AC
  Administered 2013-02-26: 1000 mL via INTRAVENOUS

## 2013-02-26 NOTE — Progress Notes (Signed)
[place], [id number], for protonix 40mg  bid 60 tabs pa; per rep this will go to MD review because of the quantity; they suggest once daily.

## 2013-02-26 NOTE — Patient Instructions (Addendum)
Hold chemotherapy today  IVF today  Continue to take marinol to help with nausea and appetite  Begin flagyl 500 mg three times a day for infection in the colon

## 2013-02-26 NOTE — Progress Notes (Signed)
[employer] approved pantoprazole 40mg  60 tabs from 02/26/13-08/19/13.

## 2013-02-26 NOTE — Patient Instructions (Addendum)
Dehydration, Adult  Dehydration means your body does not have as much fluid as it needs. Your kidneys, brain, and heart will not work properly without the right amount of fluids and salt.   HOME CARE   Ask your doctor how to replace body fluid losses (rehydrate).   Drink enough fluids to keep your pee (urine) clear or pale yellow.   Drink small amounts of fluids often if you feel sick to your stomach (nauseous) or throw up (vomit).   Eat like you normally do.   Avoid:   Foods or drinks high in sugar.   Bubbly (carbonated) drinks.   Juice.   Very hot or cold fluids.   Drinks with caffeine.   Fatty, greasy foods.   Alcohol.   Tobacco.   Eating too much.   Gelatin desserts.   Wash your hands to avoid spreading germs (bacteria, viruses).   Only take medicine as told by your doctor.   Keep all doctor visits as told.  GET HELP RIGHT AWAY IF:    You cannot drink something without throwing up.   You get worse even with treatment.   Your vomit has blood in it or looks greenish.   Your poop (stool) has blood in it or looks black and tarry.   You have not peed in 6 to 8 hours.   You pee a small amount of very dark pee.   You have a fever.   You pass out (faint).   You have belly (abdominal) pain that gets worse or stays in one spot (localizes).   You have a rash, stiff neck, or bad headache.   You get easily annoyed, sleepy, or are hard to wake up.   You feel weak, dizzy, or very thirsty.  MAKE SURE YOU:    Understand these instructions.   Will watch your condition.   Will get help right away if you are not doing well or get worse.  Document Released: 06/02/2009 Document Revised: 10/29/2011 Document Reviewed: 03/26/2011  [employer] Patient Information 2014 [employer]

## 2013-02-26 NOTE — Progress Notes (Signed)
RECEIVED A FAX FROM [facility] CONCERNING A PRIOR AUTHORIZATION FOR PROTONIX. THIS REQUEST WAS GIVEN TO EBONY SMITH IN MANAGED CARE.

## 2013-02-27 NOTE — Progress Notes (Signed)
OFFICE PROGRESS NOTE  CC  Colin Rodriguez, MD [address] 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 He underwent pancreatic code duodenectomy. His final pathology revealed a well to moderately differentiated adenocarcinoma the tumor arose from high grade dysplasia involving small bowel mucosa in the ampulla invaded into the pancreas margins. The margins were negative LV I was noted one lymph node was positive for metastatic disease additional 14 lymph nodes were negative. Portal lymph node resection revealed 5 benign lymph nodes. Pathologically T3 N1.  #2 patient completed adjuvant Gemzar for one cycle prior to start of radiation therapy he tolerated it well.   #3 he completed adjuvant radiation with radiosensitizing Xeloda on 10/31/2012. He tolerated the dose well  #4 began adjuvant single agent Gemzar 3 weeks on one week off beginning 11/20/2012. We'll plan on giving him a total of 3 cycles.  CURRENT THERAPY: Gemzar week to be held  INTERVAL HISTORY: Faisal Stradling 67 y.o. male returns for followup visit he is accompanied by his wife and interpreter. Today his main complaint is abdominal pain fatigue not eating as much. He is also coughing. I have recommended that we give him azithromycin empirically for possible chronic bronchitis. We will also obtain a chest x-ray. Patient complains of having diffuse abdominal pain associated with nausea. He also is anorexic and has a very poor appetite. He is barely eating or drinking anything. He is weak tired and fatigued. Remainder of the review of systems is negative.  MEDICAL HISTORY:  Past Medical History  Diagnosis Date  . Hypertension   . High cholesterol   . Coronary artery disease   .  Common bile duct (CBD) stricture     Malignant  . 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 Pneumonia   . Constipation   . Myocardial infarction     15 years ago    ALLERGIES:  has No Known Allergies.  MEDICATIONS:  Current Outpatient Prescriptions  Medication Sig Dispense Refill  . acetaminophen (TYLENOL) 500 MG tablet Take 500 mg by mouth every 6 (six) hours as needed. For pain      . lipase/protease/amylase (CREON) 12000 UNITS CPEP Take 2 capsules by mouth 3 (three) times daily before meals.  180 capsule  12  . metoprolol tartrate (LOPRESSOR) 25 MG tablet       . Multiple Vitamin (MULTIVITAMIN WITH MINERALS) TABS Take 1 tablet by mouth daily.      [name] oxyCODONE-acetaminophen (PERCOCET/ROXICET) 5-325 MG per tablet       . pravastatin (PRAVACHOL) 40 MG tablet       . azithromycin (ZITHROMAX) 250 MG tablet       . benzonatate (TESSALON) 200 MG capsule Take 1 capsule (200 mg total) by mouth 3 (three) times daily as needed for cough.  20 capsule  3  . dronabinol (MARINOL) 2.5 MG capsule Take 1 capsule (2.5 mg total) by mouth 2 (two) times daily before a meal.  30 capsule  3  . lidocaine-prilocaine (EMLA) cream Apply topically as needed. Apply to port site 1-2 hours prior to chemotherapy appt.  30 g  3  . lidocaine-prilocaine (EMLA) cream Apply topically as needed. Apply to port 1-2 hours before procedure  30 g  2  .  metroNIDAZOLE (FLAGYL) 500 MG tablet Take 1 tablet (500 mg total) by mouth 3 (three) times daily.  21 tablet  1  . ondansetron (ZOFRAN) 8 MG tablet Take 1 tablet (8 mg total) by mouth every 8 (eight) hours as needed. For nausea  30 tablet  3  . pantoprazole (PROTONIX) 40 MG tablet Take 1 tablet (40 mg total) by mouth 2 (two) times daily at 10 AM and 5 PM.  60 tablet  6  . polyethylene glycol powder (MIRALAX) powder Take 17 g by mouth daily.  255 g  4   No current facility-administered medications for this visit.    Facility-Administered Medications Ordered in Other Visits  Medication Dose Route Frequency Provider Last Rate Last Dose  . sodium chloride 0.9 % injection 10 mL  10 mL Intravenous PRN Victorino December, MD   10 mL at 10/30/12 1409    SURGICAL HISTORY:  Past Surgical History  Procedure Laterality Date  . Ercp  05/07/2012    Procedure: ENDOSCOPIC RETROGRADE CHOLANGIOPANCREATOGRAPHY (ERCP);  Surgeon: [name], MD;  Location: [facility];  Service: Endoscopy;  Laterality: N/A;  Dr [name]  . Eus  05/07/2012    Procedure: UPPER ENDOSCOPIC ULTRASOUND (EUS) RADIAL;  Surgeon: [name], MD;  Location: [facility];  Service: Endoscopy;;  . Appendectomy      open, 25 yrs ago  . Coronary stent placement      2 stents placed  . Whipple procedure  06/06/2012    Procedure: WHIPPLE PROCEDURE;  Surgeon: [name], MD;  Location: [facility] OR;  Service: General;  Laterality: N/A;  Pancreatico duodenectomy  . Cholecystectomy  06/06/2012    Procedure: CHOLECYSTECTOMY;  Surgeon: [name], MD;  Location[facility] OR;  Service: General;  Laterality: N/A;  . Pancreatic stent placement  06/06/2012    Procedure: PANCREATIC STENT PLACEMENT;  Surgeon: [name], MD;  Location: [facility] OR;  Service: General;  Laterality: N/A;  Removal of biliary stent; Placement of pacreatic stent  . Lymph node dissection  06/06/2012    Procedure: LYMPH NODE DISSECTION;  Surgeon: [name], MD;  Location: [facility] OR;  Service: General;  Laterality: N/A;  Portal lymph node dissection  . Coronary angioplasty      8 or 9 years ago  . Portacath placement  07/24/2012    Procedure: INSERTION PORT-A-CATH;  Surgeon: [name], MD;  Location: [facility] OR;  Service: General;  Laterality: Left;    REVIEW OF SYSTEMS:   General: fatigue (+), night sweats (-), fever (-), pain (-) Lymph: palpable nodes (-) HEENT: vision changes (-), mucositis (-), gum bleeding (-), epistaxis (-) Cardiovascular: chest pain (-), palpitations (-) Pulmonary:  shortness of breath (-), dyspnea on exertion (-), cough (-), hemoptysis (-) GI:  Early satiety (-), melena (-), dysphagia (-), nausea/vomiting (-), diarrhea (-) GU: dysuria (-), hematuria (-), incontinence (-) Musculoskeletal: joint swelling (-), joint pain (-), back pain (-) Neuro: weakness (-), numbness (-), headache (-), confusion (-) Skin: Rash (-), lesions (-), dryness (-) Psych: depression (-), suicidal/homicidal ideation (-), feeling of hopelessness (-)   PHYSICAL EXAMINATION: Blood pressure 94/63, pulse 62, temperature 97 F (36.1 C), temperature source Oral, resp. rate 18, height 5\' 6"  (1.676 m), weight 138 lb (62.596 kg). Body mass index is 22.28 kg/(m^2). General: Patient is a well appearing male in no acute distress HEENT: PERRLA, sclerae anicteric no conjunctival pallor, MMM Neck: supple, no palpable adenopathy Lungs: clear to auscultation bilaterally, no wheezes, rhonchi, or rales Cardiovascular: tachycardic, regular rhythm, S1,  S2, no murmurs, rubs or gallops Abdomen: Soft, non-distended, normoactive bowel sounds, no HSM, extremely tender to palpation, particularly the RLQ, with rebound.  Patient guarding abdomen when moving on exam table and grimacing while moving.  Extremities: warm and well perfused, no clubbing, cyanosis, or edema Skin: No rashes or lesions Neuro: Non-focal  ECOG PERFORMANCE STATUS: 1 - Symptomatic but completely ambulatory   LABORATORY DATA: Lab Results  Component Value Date   WBC 5.9 02/26/2013   HGB 12.2* 02/26/2013   HCT 35.9* 02/26/2013   MCV 90.0 02/26/2013   PLT 188 02/26/2013      Chemistry      Component Value Date/Time   NA 139 02/26/2013 1425   NA 134* 08/30/2012 0404   K 3.6 02/26/2013 1425   K 4.1 08/30/2012 0404   CL 107 02/05/2013 1108   CL 106 08/30/2012 0404   CO2 23 02/26/2013 1425   CO2 17* 08/30/2012 0404   BUN 6.9* 02/26/2013 1425   BUN 6 08/30/2012 0404   CREATININE 0.8 02/26/2013 1425   CREATININE 0.75 08/30/2012 0404       Component Value Date/Time   CALCIUM 8.0* 02/26/2013 1425   CALCIUM 8.4 08/30/2012 0404   ALKPHOS 112 02/26/2013 1425   ALKPHOS 99 08/28/2012 0430   AST 35* 02/26/2013 1425   AST 17 08/28/2012 0430   ALT 23 02/26/2013 1425   ALT 29 08/28/2012 0430   BILITOT 0.71 02/26/2013 1425   BILITOT 0.3 08/28/2012 0430       RADIOGRAPHIC STUDIES:    ASSESSMENT: 67 year old gentleman with  #1 periampullary carcinoma stage III patient is status post Whipple procedure overall she's done well with it. You will not receive adjuvant chemotherapy initially consisting of Gemzar for one cycle. He will then go on to receive concomitant chemoradiation with chemotherapy consisting of xeloda. Risks and benefits of treatment have been discussed with them completely.  #2 patient has now completed the initial chemotherapy of one cycle of Gemzar. Unfortunately his course was complicated by development of abdominal pain weakness and fatigue and therefore he was admitted to the hospital with a small bowel obstruction this is now resolved.  #3 patient has completed concurrent radiation therapy and Xeloda. Overall he tolerated it well. He started the radiation 09/24/2012 through 10/31/2012. Concurrently he did receive escalating doses of Xeloda but maximal dose of we got into him was 1500 mg daily Monday through Friday.  #4 patient began adjuvant Gemzar on 11/20/2012. His dosing was thousand milligrams per meter squared. He tolerated it well. He is now here for cycle  4 of Gemzar only.we will dose reduce 800 mg/meter squared.He has had significant myelosuppression so we have reduced his doses.  #5 patient has significant dehydration and he is thrombocytopenic he is also coughing he continues to have abdominal pain. I am concerned about his symptoms workup is as below     PLAN:  #1Proceed with IVF only today  #2We will hold chemotherapy today due to low paltelets and weakness  #3 Chest x-ray for cough  #4 X-ray of the  belly/abdomen for pain evaluation  #5 Azithromycin (antibiotic) take 2 today then one a day for 4 four days  #6 I will see you back in 1 week   All questions were answered. The patient knows to call the clinic with any problems, questions or concerns. We can certainly see the patient much sooner if necessary.  I spent 25 minutes counseling the patient face to face. The total time spent in  the appointment was 30 minutes.  Drue Second, MD Medical/Oncology [facility] [phone] (beeper) [phone] (Office)

## 2013-03-02 NOTE — Progress Notes (Signed)
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 He underwent pancreatic code duodenectomy. His final pathology revealed a well to moderately differentiated adenocarcinoma the tumor arose from high grade dysplasia involving small bowel mucosa in the ampulla invaded into the pancreas margins. The margins were negative LV I was noted one lymph node was positive for metastatic disease additional 14 lymph nodes were negative. Portal lymph node resection revealed 5 benign lymph nodes. Pathologically T3 N1.  #2 patient completed adjuvant Gemzar for one cycle prior to start of radiation therapy he tolerated it well.   #3 he completed adjuvant radiation with radiosensitizing Xeloda on 10/31/2012. He tolerated the dose well  #4 began adjuvant single agent Gemzar 3 weeks on one week off beginning 11/20/2012. We'll plan on giving him a total of 3 cycles.  CURRENT THERAPY: Chemotherapy will be held today.  INTERVAL HISTORY: Colin Castro 67 y.o. male returns for followup visit to get next cycle of chemotherapy. Clinically he seems to have developed more nausea vomiting abdominal pain no fevers chills. His appetite is very poor he has lost weight. He is not eating anything. I've given him a prescription for Megace but it is too expensive for him to take her. We discussed using antiemetics such as Zofran he apparently has run out of it. I do think he may benefit from Marinol as an appetite stimulant as well as an anti-emetic. He is dehydrated I have recommended giving him IV fluids. He is having bowel movements. He is weak tired fatigued.  MEDICAL HISTORY:  Past Medical History  Diagnosis Date  .  Hypertension   . High cholesterol   . Coronary artery disease   . Common bile duct (CBD) stricture     Malignant  . 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 Pneumonia   . Constipation   . Myocardial infarction     15 years ago    ALLERGIES:  has No Known Allergies.  MEDICATIONS:  Current Outpatient Prescriptions  Medication Sig Dispense Refill  . acetaminophen (TYLENOL) 500 MG tablet Take 500 mg by mouth every 6 (six) hours as needed. For pain      . azithromycin (ZITHROMAX) 250 MG tablet       . lipase/protease/amylase (CREON) 12000 UNITS CPEP Take 2 capsules by mouth 3 (three) times daily before meals.  180 capsule  12  . metoprolol tartrate (LOPRESSOR) 25 MG tablet       . Multiple Vitamin (MULTIVITAMIN WITH MINERALS) TABS Take 1 tablet by mouth daily.      . ondansetron (ZOFRAN) 8 MG tablet Take 1 tablet (8 mg total) by mouth every 8 (eight) hours as needed. For nausea  30 tablet  3  . benzonatate (TESSALON) 200 MG capsule Take 1 capsule (200 mg total) by mouth 3 (three) times daily as needed for cough.  20 capsule  3  . dronabinol (MARINOL) 2.5 MG capsule Take 1 capsule (2.5 mg total) by mouth 2 (two) times daily before a meal.  30 capsule  3  . lidocaine-prilocaine (EMLA) cream Apply topically as needed. Apply to port site 1-2 hours prior to chemotherapy appt.  30 g  3  . lidocaine-prilocaine (  EMLA) cream Apply topically as needed. Apply to port 1-2 hours before procedure  30 g  2  . metroNIDAZOLE (FLAGYL) 500 MG tablet Take 1 tablet (500 mg total) by mouth 3 (three) times daily.  21 tablet  1  . oxyCODONE-acetaminophen (PERCOCET/ROXICET) 5-325 MG per tablet       . pantoprazole (PROTONIX) 40 MG tablet Take 1 tablet (40 mg total) by mouth 2 (two) times daily at 10 AM and 5 PM.  60 tablet  6  . polyethylene glycol powder (MIRALAX) powder Take 17 g by mouth daily.  255 g  4  . pravastatin (PRAVACHOL) 40 MG tablet         No current facility-administered medications for this visit.   Facility-Administered Medications Ordered in Other Visits  Medication Dose Route Frequency Provider Last Rate Last Dose  . sodium chloride 0.9 % injection 10 mL  10 mL Intravenous PRN Victorino December, MD   10 mL at 10/30/12 1409    SURGICAL HISTORY:  Past Surgical History  Procedure Laterality Date  . Ercp  05/07/2012    Procedure: ENDOSCOPIC RETROGRADE CHOLANGIOPANCREATOGRAPHY (ERCP);  Surgeon: [name], MD;  Location: [facility];  Service: Endoscopy;  Laterality: N/A;  Dr [name]  . Eus  05/07/2012    Procedure: UPPER ENDOSCOPIC ULTRASOUND (EUS) RADIAL;  Surgeon: [name], MD;  Location: [facility];  Service: Endoscopy;;  . Appendectomy      open, 25 yrs ago  . Coronary stent placement      2 stents placed  . Whipple procedure  06/06/2012    Procedure: WHIPPLE PROCEDURE;  Surgeon: [name], MD;  Location: [facility] OR;  Service: General;  Laterality: N/A;  Pancreatico duodenectomy  . Cholecystectomy  06/06/2012    Procedure: CHOLECYSTECTOMY;  Surgeon: [name], MD;  Location: [facility] OR;  Service: General;  Laterality: N/A;  . Pancreatic stent placement  06/06/2012    Procedure: PANCREATIC STENT PLACEMENT;  Surgeon: [name], MD;  Location: [facility] OR;  Service: General;  Laterality: N/A;  Removal of biliary stent; Placement of pacreatic stent  . Lymph node dissection  06/06/2012    Procedure: LYMPH NODE DISSECTION;  Surgeon: [name], MD;  Location: [facility] OR;  Service: General;  Laterality: N/A;  Portal lymph node dissection  . Coronary angioplasty      8 or 9 years ago  . Portacath placement  07/24/2012    Procedure: INSERTION PORT-A-CATH;  Surgeon: [name], MD;  Location: [facility] OR;  Service: General;  Laterality: Left;    REVIEW OF SYSTEMS:   General: fatigue (+), night sweats (-), fever (-), pain (-) Lymph: palpable nodes (-) HEENT: vision changes (-), mucositis (-), gum bleeding (-), epistaxis  (-) Cardiovascular: chest pain (-), palpitations (-) Pulmonary: shortness of breath (-), dyspnea on exertion (-), cough (-), hemoptysis (-) GI:  Early satiety (-), melena (-), dysphagia (-), nausea/vomiting (-), diarrhea (-) GU: dysuria (-), hematuria (-), incontinence (-) Musculoskeletal: joint swelling (-), joint pain (-), back pain (-) Neuro: weakness (-), numbness (-), headache (-), confusion (-) Skin: Rash (-), lesions (-), dryness (-) Psych: depression (-), suicidal/homicidal ideation (-), feeling of hopelessness (-)   PHYSICAL EXAMINATION: Blood pressure 95/61, pulse 61, temperature 97.2 F (36.2 C), temperature source Oral, resp. rate 20, height 5\' 6"  (1.676 m), weight 135 lb 4.8 oz (61.372 kg). Body mass index is 21.85 kg/(m^2). General: Patient is a well appearing male in no acute distress HEENT: PERRLA, sclerae anicteric no conjunctival pallor, MMM Neck:  supple, no palpable adenopathy Lungs: clear to auscultation bilaterally, no wheezes, rhonchi, or rales Cardiovascular: tachycardic, regular rhythm, S1, S2, no murmurs, rubs or gallops Abdomen: Soft, non-distended, normoactive bowel sounds, no HSM, extremely tender to palpation, particularly the RLQ, with rebound.  Patient guarding abdomen when moving on exam table and grimacing while moving.  Extremities: warm and well perfused, no clubbing, cyanosis, or edema Skin: No rashes or lesions Neuro: Non-focal  ECOG PERFORMANCE STATUS: 1 - Symptomatic but completely ambulatory   LABORATORY DATA: Lab Results  Component Value Date   WBC 5.9 02/26/2013   HGB 12.2* 02/26/2013   HCT 35.9* 02/26/2013   MCV 90.0 02/26/2013   PLT 188 02/26/2013      Chemistry      Component Value Date/Time   NA 139 02/26/2013 1425   NA 134* 08/30/2012 0404   K 3.6 02/26/2013 1425   K 4.1 08/30/2012 0404   CL 107 02/05/2013 1108   CL 106 08/30/2012 0404   CO2 23 02/26/2013 1425   CO2 17* 08/30/2012 0404   BUN 6.9* 02/26/2013 1425   BUN 6 08/30/2012 0404    CREATININE 0.8 02/26/2013 1425   CREATININE 0.75 08/30/2012 0404      Component Value Date/Time   CALCIUM 8.0* 02/26/2013 1425   CALCIUM 8.4 08/30/2012 0404   ALKPHOS 112 02/26/2013 1425   ALKPHOS 99 08/28/2012 0430   AST 35* 02/26/2013 1425   AST 17 08/28/2012 0430   ALT 23 02/26/2013 1425   ALT 29 08/28/2012 0430   BILITOT 0.71 02/26/2013 1425   BILITOT 0.3 08/28/2012 0430       RADIOGRAPHIC STUDIES:    ASSESSMENT: 67 year old gentleman with  #1 periampullary carcinoma stage III patient is status post Whipple procedure overall she's done well with it. You will not receive adjuvant chemotherapy initially consisting of Gemzar for one cycle. He will then go on to receive concomitant chemoradiation with chemotherapy consisting of xeloda. Risks and benefits of treatment have been discussed with them completely.  #2 patient has now completed the initial chemotherapy of one cycle of Gemzar. Unfortunately his course was complicated by development of abdominal pain weakness and fatigue and therefore he was admitted to the hospital with a small bowel obstruction this is now resolved.  #3 patient has completed concurrent radiation therapy and Xeloda. Overall he tolerated it well. He started the radiation 09/24/2012 through 10/31/2012. Concurrently he did receive escalating doses of Xeloda but maximal dose of we got into him was 1500 mg daily Monday through Friday.  #4 patient began adjuvant Gemzar on 11/20/2012. His dosing was thousand milligrams per meter squared. He tolerated it well. He is now here for cycle  4 of Gemzar only.we will dose reduce 800 mg/meter squared.He has had significant myelosuppression so we have reduced his doses.  #5 Abdominal pain persistent and recurring associated with nausea and vomiting CT with possible C.diff colitis  #6 dehydration  #7 malnutrition     PLAN:   Hold chemotherapy today  IVF today  Continue to take marinol to help with nausea and appetite  Begin  flagyl 500 mg three times a day for infection in the colon   All questions were answered. The patient knows to call the clinic with any problems, questions or concerns. We can certainly see the patient much sooner if necessary.  I spent 25 minutes counseling the patient face to face. The total time spent in the appointment was 30 minutes.  Drue Second, MD Medical/Oncology [facility] [phone] (beeper) [phone] (Office)

## 2013-03-05 ENCOUNTER — Ambulatory Visit (HOSPITAL_BASED_OUTPATIENT_CLINIC_OR_DEPARTMENT_OTHER): Payer: Medicare Other | Admitting: Oncology

## 2013-03-05 ENCOUNTER — Ambulatory Visit (HOSPITAL_BASED_OUTPATIENT_CLINIC_OR_DEPARTMENT_OTHER): Payer: Medicare Other

## 2013-03-05 ENCOUNTER — Telehealth: Payer: Self-pay | Admitting: *Deleted

## 2013-03-05 ENCOUNTER — Other Ambulatory Visit (HOSPITAL_BASED_OUTPATIENT_CLINIC_OR_DEPARTMENT_OTHER): Payer: Medicare Other | Admitting: Lab

## 2013-03-05 VITALS — BP 92/59 | HR 58 | Temp 98.2°F | Resp 18 | Ht 66.0 in | Wt 137.6 lb

## 2013-03-05 DIAGNOSIS — Z5111 Encounter for antineoplastic chemotherapy: Secondary | ICD-10-CM

## 2013-03-05 DIAGNOSIS — E46 Unspecified protein-calorie malnutrition: Secondary | ICD-10-CM

## 2013-03-05 DIAGNOSIS — C241 Malignant neoplasm of ampulla of Vater: Secondary | ICD-10-CM

## 2013-03-05 DIAGNOSIS — E860 Dehydration: Secondary | ICD-10-CM

## 2013-03-05 LAB — COMPREHENSIVE METABOLIC PANEL (CC13)
AST: 26 U/L (ref 5–34)
Albumin: 2.5 g/dL — ABNORMAL LOW (ref 3.5–5.0)
Alkaline Phosphatase: 101 U/L (ref 40–150)
BUN: 9.8 mg/dL (ref 7.0–26.0)
Potassium: 3.6 mEq/L (ref 3.5–5.1)
Total Bilirubin: 0.45 mg/dL (ref 0.20–1.20)

## 2013-03-05 LAB — CBC WITH DIFFERENTIAL/PLATELET
BASO%: 0.4 % (ref 0.0–2.0)
EOS%: 1.3 % (ref 0.0–7.0)
HCT: 36.4 % — ABNORMAL LOW (ref 38.4–49.9)
MCH: 30.8 pg (ref 27.2–33.4)
MCHC: 33.5 g/dL (ref 32.0–36.0)
MONO%: 14 % (ref 0.0–14.0)
NEUT%: 58.2 % (ref 39.0–75.0)
RDW: 21.7 % — ABNORMAL HIGH (ref 11.0–14.6)
lymph#: 1.8 10*3/uL (ref 0.9–3.3)

## 2013-03-05 MED ORDER — SODIUM CHLORIDE 0.9 % IJ SOLN
10.0000 mL | INTRAMUSCULAR | Status: DC | PRN
  Administered 2013-03-05: 10 mL
  Filled 2013-03-05: qty 10

## 2013-03-05 MED ORDER — SODIUM CHLORIDE 0.9 % IV SOLN
Freq: Once | INTRAVENOUS | Status: AC
  Administered 2013-03-05: 16:00:00 via INTRAVENOUS

## 2013-03-05 MED ORDER — SODIUM CHLORIDE 0.9 % IV SOLN: Freq: Once | INTRAVENOUS | Status: DC

## 2013-03-05 MED ORDER — SODIUM CHLORIDE 0.9 % IV SOLN
800.0000 mg/m2 | Freq: Once | INTRAVENOUS | Status: AC
  Administered 2013-03-05: 1368 mg via INTRAVENOUS
  Filled 2013-03-05: qty 36

## 2013-03-05 MED ORDER — PROCHLORPERAZINE MALEATE 10 MG PO TABS
10.0000 mg | ORAL_TABLET | Freq: Once | ORAL | Status: AC
  Administered 2013-03-05: 10 mg via ORAL

## 2013-03-05 MED ORDER — HEPARIN SOD (PORK) LOCK FLUSH 100 UNIT/ML IV SOLN
500.0000 [IU] | Freq: Once | INTRAVENOUS | Status: AC | PRN
  Administered 2013-03-05: 500 [IU]
  Filled 2013-03-05: qty 5

## 2013-03-05 NOTE — Telephone Encounter (Signed)
appts made and printed. Call tx area to make sure they are aware of the ivf today. Pt is aware that tx will be added i emailed MW to add the tx. i sw MW and she said that she would make sure that the charge nurse is aware of the pts ivf today...td

## 2013-03-05 NOTE — Patient Instructions (Addendum)
[facility] Discharge Instructions for Patients Receiving Chemotherapy  Today you received the following chemotherapy agents: gemzar  To help prevent nausea and vomiting after your treatment, we encourage you to take your nausea medication.  Take it as often as prescribed.     If you develop nausea and vomiting that is not controlled by your nausea medication, call the clinic. If it is after clinic hours your family physician or the after hours number for the clinic or go to the Emergency Department.   BELOW ARE SYMPTOMS THAT SHOULD BE REPORTED IMMEDIATELY:  *FEVER GREATER THAN 100.5 F  *CHILLS WITH OR WITHOUT FEVER  NAUSEA AND VOMITING THAT IS NOT CONTROLLED WITH YOUR NAUSEA MEDICATION  *UNUSUAL SHORTNESS OF BREATH  *UNUSUAL BRUISING OR BLEEDING  TENDERNESS IN MOUTH AND THROAT WITH OR WITHOUT PRESENCE OF ULCERS  *URINARY PROBLEMS  *BOWEL PROBLEMS  UNUSUAL RASH Items with * indicate a potential emergency and should be followed up as soon as possible.  Feel free to call the clinic you have any questions or concerns. The clinic phone number is [phone].   I have been informed and understand all the instructions given to me. I know to contact the clinic, my physician, or go to the Emergency Department if any problems should occur. I do not have any questions at this time, but understand that I may call the clinic during office hours   should I have any questions or need assistance in obtaining follow up care.    __________________________________________  _____________  __________ Signature of Patient or Authorized Representative            Date                   Time    __________________________________________ Nurse's Signature      Dehydration, Adult Dehydration is when you lose more fluids from the body than you take in. Vital organs like the kidneys, brain, and heart cannot function without a proper amount of fluids and salt. Any loss of fluids  from the body can cause dehydration.  CAUSES   Vomiting.  Diarrhea.  Excessive sweating.  Excessive urine output.  Fever. SYMPTOMS  Mild dehydration  Thirst.  Dry lips.  Slightly dry mouth. Moderate dehydration  Very dry mouth.  Sunken eyes.  Skin does not bounce back quickly when lightly pinched and released.  Dark urine and decreased urine production.  Decreased tear production.  Headache. Severe dehydration  Very dry mouth.  Extreme thirst.  Rapid, weak pulse (more than 100 beats per minute at rest).  Cold hands and feet.  Not able to sweat in spite of heat and temperature.  Rapid breathing.  Blue lips.  Confusion and lethargy.  Difficulty being awakened.  Minimal urine production.  No tears. DIAGNOSIS  Your caregiver will diagnose dehydration based on your symptoms and your exam. Blood and urine tests will help confirm the diagnosis. The diagnostic evaluation should also identify the cause of dehydration. TREATMENT  Treatment of mild or moderate dehydration can often be done at home by increasing the amount of fluids that you drink. It is best to drink small amounts of fluid more often. Drinking too much at one time can make vomiting worse. Refer to the home care instructions below. Severe dehydration needs to be treated at the hospital where you will probably be given intravenous (IV) fluids that contain water and electrolytes. HOME CARE INSTRUCTIONS   Ask your caregiver about specific rehydration instructions.  Drink  enough fluids to keep your urine clear or pale yellow.  Drink small amounts frequently if you have nausea and vomiting.  Eat as you normally do.  Avoid:  Foods or drinks high in sugar.  Carbonated drinks.  Juice.  Extremely hot or cold fluids.  Drinks with caffeine.  Fatty, greasy foods.  Alcohol.  Tobacco.  Overeating.  Gelatin desserts.  Wash your hands well to avoid spreading bacteria and  viruses.  Only take over-the-counter or prescription medicines for pain, discomfort, or fever as directed by your caregiver.  Ask your caregiver if you should continue all prescribed and over-the-counter medicines.  Keep all follow-up appointments with your caregiver. SEEK MEDICAL CARE IF:  You have abdominal pain and it increases or stays in one area (localizes).  You have a rash, stiff neck, or severe headache.  You are irritable, sleepy, or difficult to awaken.  You are weak, dizzy, or extremely thirsty. SEEK IMMEDIATE MEDICAL CARE IF:   You are unable to keep fluids down or you get worse despite treatment.  You have frequent episodes of vomiting or diarrhea.  You have blood or green matter (bile) in your vomit.  You have blood in your stool or your stool looks black and tarry.  You have not urinated in 6 to 8 hours, or you have only urinated a small amount of very dark urine.  You have a fever.  You faint. MAKE SURE YOU:   Understand these instructions.  Will watch your condition.  Will get help right away if you are not doing well or get worse. Document Released: 08/06/2005 Document Revised: 10/29/2011 Document Reviewed: 03/26/2011 [facility] Patient Information 2014 [place], [place].

## 2013-03-05 NOTE — Telephone Encounter (Signed)
Per staff message and POF I have scheduled appts.  JMW  

## 2013-03-12 ENCOUNTER — Ambulatory Visit (HOSPITAL_BASED_OUTPATIENT_CLINIC_OR_DEPARTMENT_OTHER): Payer: Medicare Other | Admitting: Oncology

## 2013-03-12 ENCOUNTER — Other Ambulatory Visit (HOSPITAL_BASED_OUTPATIENT_CLINIC_OR_DEPARTMENT_OTHER): Payer: Medicare Other | Admitting: Lab

## 2013-03-12 ENCOUNTER — Ambulatory Visit: Payer: Medicare Other

## 2013-03-12 VITALS — BP 96/64 | HR 61 | Temp 97.0°F | Resp 18 | Ht 66.0 in | Wt 137.1 lb

## 2013-03-12 DIAGNOSIS — C241 Malignant neoplasm of ampulla of Vater: Secondary | ICD-10-CM

## 2013-03-12 LAB — CBC WITH DIFFERENTIAL/PLATELET
BASO%: 1.5 % (ref 0.0–2.0)
Basophils Absolute: 0 10*3/uL (ref 0.0–0.1)
HCT: 34 % — ABNORMAL LOW (ref 38.4–49.9)
HGB: 11.2 g/dL — ABNORMAL LOW (ref 13.0–17.1)
LYMPH%: 36 % (ref 14.0–49.0)
MCHC: 32.9 g/dL (ref 32.0–36.0)
MONO#: 0.4 10*3/uL (ref 0.1–0.9)
NEUT%: 41.7 % (ref 39.0–75.0)
Platelets: 128 10*3/uL — ABNORMAL LOW (ref 140–400)
WBC: 2 10*3/uL — ABNORMAL LOW (ref 4.0–10.3)
lymph#: 0.7 10*3/uL — ABNORMAL LOW (ref 0.9–3.3)

## 2013-03-12 MED ORDER — CIPROFLOXACIN HCL 500 MG PO TABS: 500.0000 mg | ORAL_TABLET | Freq: Two times a day (BID) | ORAL | Status: DC

## 2013-03-16 ENCOUNTER — Telehealth: Payer: Self-pay | Admitting: Oncology

## 2013-03-16 NOTE — Telephone Encounter (Signed)
Per staff message from KK she will see pt 8/15 @ 3pm. S/w pt re lb/fu for 8/15 @ 2:30 pm.

## 2013-03-25 ENCOUNTER — Other Ambulatory Visit: Payer: Self-pay

## 2013-03-26 NOTE — Progress Notes (Signed)
OFFICE PROGRESS NOTE  CC  Colin Rodriguez, MD [address] 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 He underwent pancreatic code duodenectomy. His final pathology revealed a well to moderately differentiated adenocarcinoma the tumor arose from high grade dysplasia involving small bowel mucosa in the ampulla invaded into the pancreas margins. The margins were negative LV I was noted one lymph node was positive for metastatic disease additional 14 lymph nodes were negative. Portal lymph node resection revealed 5 benign lymph nodes. Pathologically T3 N1.  #2 patient completed adjuvant Gemzar for one cycle prior to start of radiation therapy he tolerated it well.   #3 he completed adjuvant radiation with radiosensitizing Xeloda on 10/31/2012. He tolerated the dose well  #4 began adjuvant single agent Gemzar 3 weeks on one week off beginning 11/20/2012. We'll plan on giving him a total of 3 cycles.  CURRENT THERAPY: Cycle 10 of single agent Gemzar  INTERVAL HISTORY: Colin Castro 67 y.o. male returns for followup visit to get next cycle of chemotherapy. He is doing significantly better in comparison to his prior visit. He doesn't have as much nausea or vomiting he's gained some weight he is eating. He has no peripheral paresthesias no diarrhea or constipation. Since being on Flagyl and Cipro he feels that his GI symptoms have resolved. Remainder of the 10 point review of systems is negative.  MEDICAL HISTORY:  Past Medical History  Diagnosis Date  . Hypertension   . High cholesterol   . Coronary artery disease   . Common bile duct (CBD) stricture     Malignant  . 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 Pneumonia   . Constipation   . Myocardial infarction     15 years ago    ALLERGIES:  has No Known Allergies.  MEDICATIONS:  Current Outpatient Prescriptions  Medication Sig Dispense Refill  . acetaminophen (TYLENOL) 500 MG tablet Take 500 mg by mouth every 6 (six) hours as needed. For pain      . dronabinol (MARINOL) 2.5 MG capsule Take 1 capsule (2.5 mg total) by mouth 2 (two) times daily before a meal.  30 capsule  3  . lidocaine-prilocaine (EMLA) cream Apply topically as needed. Apply to port site 1-2 hours prior to chemotherapy appt.  30 g  3  . lidocaine-prilocaine (EMLA) cream Apply topically as needed. Apply to port 1-2 hours before procedure  30 g  2  . lipase/protease/amylase (CREON) 12000 UNITS CPEP Take 2 capsules by mouth 3 (three) times daily before meals.  180 capsule  12  . metoprolol tartrate (LOPRESSOR) 25 MG tablet       . metroNIDAZOLE (FLAGYL) 500 MG tablet Take 1 tablet (500 mg total) by mouth 3 (three) times daily.  21 tablet  1  . Multiple Vitamin (MULTIVITAMIN WITH MINERALS) TABS Take 1 tablet by mouth daily.      . ondansetron (ZOFRAN) 8 MG tablet Take 1 tablet (8 mg total) by mouth every 8 (eight) hours as needed. For nausea  30 tablet  3  . oxyCODONE-acetaminophen (PERCOCET/ROXICET) 5-325 MG per tablet       . pantoprazole (PROTONIX) 40 MG tablet Take 1 tablet (40 mg total) by mouth 2 (two) times daily at 10  AM and 5 PM.  60 tablet  6  . polyethylene glycol powder (MIRALAX) powder Take 17 g by mouth daily.  255 g  4  . pravastatin (PRAVACHOL) 40 MG tablet       . azithromycin (ZITHROMAX) 250 MG tablet       . benzonatate (TESSALON) 200 MG capsule Take 1 capsule (200 mg total) by mouth 3 (three) times daily as needed for cough.  20 capsule  3  . ciprofloxacin (CIPRO) 500 MG tablet Take 1 tablet (500 mg total) by mouth 2 (two) times daily.  14 tablet  0   Current Facility-Administered Medications  Medication Dose Route  Frequency Provider Last Rate Last Dose  . 0.9 %  sodium chloride infusion   Intravenous Once Victorino December, MD       Facility-Administered Medications Ordered in Other Visits  Medication Dose Route Frequency Provider Last Rate Last Dose  . sodium chloride 0.9 % injection 10 mL  10 mL Intravenous PRN Victorino December, MD   10 mL at 10/30/12 1409    SURGICAL HISTORY:  Past Surgical History  Procedure Laterality Date  . Ercp  05/07/2012    Procedure: ENDOSCOPIC RETROGRADE CHOLANGIOPANCREATOGRAPHY (ERCP);  Surgeon: [name], MD;  Location: [facility];  Service: Endoscopy;  Laterality: N/A;  Dr [name]  . Eus  05/07/2012    Procedure: UPPER ENDOSCOPIC ULTRASOUND (EUS) RADIAL;  Surgeon: [name], MD;  Location: [facility];  Service: Endoscopy;;  . Appendectomy      open, 25 yrs ago  . Coronary stent placement      2 stents placed  . Whipple procedure  06/06/2012    Procedure: WHIPPLE PROCEDURE;  Surgeon: [name], MD;  Location: [facility] OR;  Service: General;  Laterality: N/A;  Pancreatico duodenectomy  . Cholecystectomy  06/06/2012    Procedure: CHOLECYSTECTOMY;  Surgeon: [name], MD;  Location: [facility] OR;  Service: General;  Laterality: N/A;  . Pancreatic stent placement  06/06/2012    Procedure: PANCREATIC STENT PLACEMENT;  Surgeon: [name], MD;  Location: [facility] OR;  Service: General;  Laterality: N/A;  Removal of biliary stent; Placement of pacreatic stent  . Lymph node dissection  06/06/2012    Procedure: LYMPH NODE DISSECTION;  Surgeon: [name], MD;  Location: [facility] OR;  Service: General;  Laterality: N/A;  Portal lymph node dissection  . Coronary angioplasty      8 or 9 years ago  . Portacath placement  07/24/2012    Procedure: INSERTION PORT-A-CATH;  Surgeon: [name], MD;  Location: [facility] OR;  Service: General;  Laterality: Left;    REVIEW OF SYSTEMS:   General: fatigue (+), night sweats (-), fever (-), pain (-) Lymph: palpable nodes (-) HEENT: vision changes  (-), mucositis (-), gum bleeding (-), epistaxis (-) Cardiovascular: chest pain (-), palpitations (-) Pulmonary: shortness of breath (-), dyspnea on exertion (-), cough (-), hemoptysis (-) GI:  Early satiety (-), melena (-), dysphagia (-), nausea/vomiting (-), diarrhea (-) GU: dysuria (-), hematuria (-), incontinence (-) Musculoskeletal: joint swelling (-), joint pain (-), back pain (-) Neuro: weakness (-), numbness (-), headache (-), confusion (-) Skin: Rash (-), lesions (-), dryness (-) Psych: depression (-), suicidal/homicidal ideation (-), feeling of hopelessness (-)   PHYSICAL EXAMINATION: Blood pressure 92/59, pulse 58, temperature 98.2 F (36.8 C), temperature source Oral, resp. rate 18, height 5\' 6"  (1.676 m), weight 137 lb 9.6 oz (62.415 kg). Body mass index is 22.22 kg/(m^2). General: Patient is a well appearing  male in no acute distress HEENT: PERRLA, sclerae anicteric no conjunctival pallor, MMM Neck: supple, no palpable adenopathy Lungs: clear to auscultation bilaterally, no wheezes, rhonchi, or rales Cardiovascular: tachycardic, regular rhythm, S1, S2, no murmurs, rubs or gallops Abdomen: Soft, non-distended, normoactive bowel sounds, no HSM, extremely tender to palpation, particularly the RLQ, with rebound.  Patient guarding abdomen when moving on exam table and grimacing while moving.  Extremities: warm and well perfused, no clubbing, cyanosis, or edema Skin: No rashes or lesions Neuro: Non-focal  ECOG PERFORMANCE STATUS: 1 - Symptomatic but completely ambulatory   LABORATORY DATA: Lab Results  Component Value Date   WBC 2.0 Repeated and Verified* 03/12/2013   HGB 11.2* 03/12/2013   HCT 34.0* 03/12/2013   MCV 92.4 03/12/2013   PLT 128 repeated & verified* 03/12/2013      Chemistry      Component Value Date/Time   NA 139 03/05/2013 1417   NA 134* 08/30/2012 0404   K 3.6 03/05/2013 1417   K 4.1 08/30/2012 0404   CL 107 02/05/2013 1108   CL 106 08/30/2012 0404   CO2 22  03/05/2013 1417   CO2 17* 08/30/2012 0404   BUN 9.8 03/05/2013 1417   BUN 6 08/30/2012 0404   CREATININE 0.9 03/05/2013 1417   CREATININE 0.75 08/30/2012 0404      Component Value Date/Time   CALCIUM 8.0* 03/05/2013 1417   CALCIUM 8.4 08/30/2012 0404   ALKPHOS 101 03/05/2013 1417   ALKPHOS 99 08/28/2012 0430   AST 26 03/05/2013 1417   AST 17 08/28/2012 0430   ALT 13 03/05/2013 1417   ALT 29 08/28/2012 0430   BILITOT 0.45 03/05/2013 1417   BILITOT 0.3 08/28/2012 0430       RADIOGRAPHIC STUDIES:    ASSESSMENT: 67 year old gentleman with  #1 periampullary carcinoma stage III patient is status post Whipple procedure overall she's done well with it. You will not receive adjuvant chemotherapy initially consisting of Gemzar for one cycle. He will then go on to receive concomitant chemoradiation with chemotherapy consisting of xeloda. Risks and benefits of treatment have been discussed with them completely.  #2 patient has now completed the initial chemotherapy of one cycle of Gemzar. Unfortunately his course was complicated by development of abdominal pain weakness and fatigue and therefore he was admitted to the hospital with a small bowel obstruction this is now resolved.  #3 patient has completed concurrent radiation therapy and Xeloda. Overall he tolerated it well. He started the radiation 09/24/2012 through 10/31/2012. Concurrently he did receive escalating doses of Xeloda but maximal dose of we got into him was 1500 mg daily Monday through Friday.  #4 patient began adjuvant Gemzar on 11/20/2012. His dosing was thousand milligrams per meter squared. He tolerated it well. He is now here for cycle  4 of Gemzar only.we will dose reduce 800 mg/meter squared.He has had significant myelosuppression so we have reduced his doses.  #5 Abdominal pain persistent and recurring associated with nausea and vomiting CT with possible C.diff colitis  #6 dehydration  #7 malnutrition     PLAN:  #1 patient will  proceed with cycle 10 of chemotherapy.  #2 he will return in one week's time for followup however patient would like to hold off on following up with me until August 2014 since he is going to [place] and [place] to visit his family for summer.   All questions were answered. The patient knows to call the clinic with any problems, questions or concerns. We  can certainly see the patient much sooner if necessary.  I spent 25 minutes counseling the patient face to face. The total time spent in the appointment was 30 minutes.  Drue Second, MD Medical/Oncology [facility] [phone] (beeper) [phone] (Office)

## 2013-03-29 NOTE — Progress Notes (Signed)
OFFICE PROGRESS NOTE  CC  [name], MD [address] 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 He underwent pancreatic code duodenectomy. His final pathology revealed a well to moderately differentiated adenocarcinoma the tumor arose from high grade dysplasia involving small bowel mucosa in the ampulla invaded into the pancreas margins. The margins were negative LV I was noted one lymph node was positive for metastatic disease additional 14 lymph nodes were negative. Portal lymph node resection revealed 5 benign lymph nodes. Pathologically T3 N1.  #2 patient completed adjuvant Gemzar for one cycle prior to start of radiation therapy he tolerated it well.   #3 he completed adjuvant radiation with radiosensitizing Xeloda on 10/31/2012. He tolerated the dose well  #4 began adjuvant single agent Gemzar 3 weeks on one week off beginning 11/20/2012. We'll plan on giving him a total of 3 cycles.  CURRENT THERAPY: status postCycle 10 of single agent Gemzar  INTERVAL HISTORY: Colin Castro 67 y.o. male returns for followup visit to get next cycle of chemotherapy. He is doing significantly better in comparison to his prior visit. He doesn't have as much nausea or vomiting he's gained some weight he is eating. He has no peripheral paresthesias no diarrhea or constipation.  Remainder of the 10 point review of systems is negative.  MEDICAL HISTORY:  Past Medical History  Diagnosis Date  . Hypertension   . High cholesterol   . Coronary artery disease   . Common bile duct (CBD) stricture     Malignant  . 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 Pneumonia   . Constipation    . Myocardial infarction     15 years ago    ALLERGIES:  has No Known Allergies.  MEDICATIONS:  Current Outpatient Prescriptions  Medication Sig Dispense Refill  . acetaminophen (TYLENOL) 500 MG tablet Take 500 mg by mouth every 6 (six) hours as needed. For pain      . azithromycin (ZITHROMAX) 250 MG tablet       . dronabinol (MARINOL) 2.5 MG capsule Take 1 capsule (2.5 mg total) by mouth 2 (two) times daily before a meal.  30 capsule  3  . lidocaine-prilocaine (EMLA) cream Apply topically as needed. Apply to port site 1-2 hours prior to chemotherapy appt.  30 g  3  . lidocaine-prilocaine (EMLA) cream Apply topically as needed. Apply to port 1-2 hours before procedure  30 g  2  . lipase/protease/amylase (CREON) 12000 UNITS CPEP Take 2 capsules by mouth 3 (three) times daily before meals.  180 capsule  12  . metoprolol tartrate (LOPRESSOR) 25 MG tablet       . metroNIDAZOLE (FLAGYL) 500 MG tablet Take 1 tablet (500 mg total) by mouth 3 (three) times daily.  21 tablet  1  . Multiple Vitamin (MULTIVITAMIN WITH MINERALS) TABS Take 1 tablet by mouth daily.      . ondansetron (ZOFRAN) 8 MG tablet Take 1 tablet (8 mg total) by mouth every 8 (eight) hours as needed. For nausea  30 tablet  3  . oxyCODONE-acetaminophen (PERCOCET/ROXICET) 5-325 MG per tablet       . pantoprazole (PROTONIX) 40 MG tablet Take 1 tablet (40 mg total) by mouth 2 (two) times daily at 10  AM and 5 PM.  60 tablet  6  . polyethylene glycol powder (MIRALAX) powder Take 17 g by mouth daily.  255 g  4  . pravastatin (PRAVACHOL) 40 MG tablet       . benzonatate (TESSALON) 200 MG capsule Take 1 capsule (200 mg total) by mouth 3 (three) times daily as needed for cough.  20 capsule  3  . ciprofloxacin (CIPRO) 500 MG tablet Take 1 tablet (500 mg total) by mouth 2 (two) times daily.  14 tablet  0   No current facility-administered medications for this visit.   Facility-Administered Medications Ordered in Other Visits  Medication  Dose Route Frequency Provider Last Rate Last Dose  . sodium chloride 0.9 % injection 10 mL  10 mL Intravenous PRN Victorino December, MD   10 mL at 10/30/12 1409    SURGICAL HISTORY:  Past Surgical History  Procedure Laterality Date  . Ercp  05/07/2012    Procedure: ENDOSCOPIC RETROGRADE CHOLANGIOPANCREATOGRAPHY (ERCP);  Surgeon: [name], MD;  Location: [facility];  Service: Endoscopy;  Laterality: N/A;  Dr [name]  . Eus  05/07/2012    Procedure: UPPER ENDOSCOPIC ULTRASOUND (EUS) RADIAL;  Surgeon: [name], MD;  Location: [facility];  Service: Endoscopy;;  . Appendectomy      open, 25 yrs ago  . Coronary stent placement      2 stents placed  . Whipple procedure  06/06/2012    Procedure: WHIPPLE PROCEDURE;  Surgeon: [name], MD;  Location: [facility] OR;  Service: General;  Laterality: N/A;  Pancreatico duodenectomy  . Cholecystectomy  06/06/2012    Procedure: CHOLECYSTECTOMY;  Surgeon: [name], MD;  Location: [facility] OR;  Service: General;  Laterality: N/A;  . Pancreatic stent placement  06/06/2012    Procedure: PANCREATIC STENT PLACEMENT;  Surgeon: [name], MD;  Location: [facility] OR;  Service: General;  Laterality: N/A;  Removal of biliary stent; Placement of pacreatic stent  . Lymph node dissection  06/06/2012    Procedure: LYMPH NODE DISSECTION;  Surgeon: [name], MD;  Location: [facility] OR;  Service: General;  Laterality: N/A;  Portal lymph node dissection  . Coronary angioplasty      8 or 9 years ago  . Portacath placement  07/24/2012    Procedure: INSERTION PORT-A-CATH;  Surgeon: [name], MD;  Location: [facility] OR;  Service: General;  Laterality: Left;    REVIEW OF SYSTEMS:   General: fatigue (+), night sweats (-), fever (-), pain (-) Lymph: palpable nodes (-) HEENT: vision changes (-), mucositis (-), gum bleeding (-), epistaxis (-) Cardiovascular: chest pain (-), palpitations (-) Pulmonary: shortness of breath (-), dyspnea on exertion (-), cough (-), hemoptysis  (-) GI:  Early satiety (-), melena (-), dysphagia (-), nausea/vomiting (-), diarrhea (-) GU: dysuria (-), hematuria (-), incontinence (-) Musculoskeletal: joint swelling (-), joint pain (-), back pain (-) Neuro: weakness (-), numbness (-), headache (-), confusion (-) Skin: Rash (-), lesions (-), dryness (-) Psych: depression (-), suicidal/homicidal ideation (-), feeling of hopelessness (-)   PHYSICAL EXAMINATION: Blood pressure 96/64, pulse 61, temperature 97 F (36.1 C), temperature source Oral, resp. rate 18, height 5\' 6"  (1.676 m), weight 137 lb 1.6 oz (62.188 kg). Body mass index is 22.14 kg/(m^2). General: Patient is a well appearing male in no acute distress HEENT: PERRLA, sclerae anicteric no conjunctival pallor, MMM Neck: supple, no palpable adenopathy Lungs: clear to auscultation bilaterally, no wheezes, rhonchi, or rales Cardiovascular: tachycardic, regular rhythm, S1, S2, no murmurs, rubs or  gallops Abdomen: Soft, non-distended, normoactive bowel sounds, no HSM, extremely tender to palpation, particularly the RLQ, with rebound.  Patient guarding abdomen when moving on exam table and grimacing while moving.  Extremities: warm and well perfused, no clubbing, cyanosis, or edema Skin: No rashes or lesions Neuro: Non-focal  ECOG PERFORMANCE STATUS: 1 - Symptomatic but completely ambulatory   LABORATORY DATA: Lab Results  Component Value Date   WBC 2.0 Repeated and Verified* 03/12/2013   HGB 11.2* 03/12/2013   HCT 34.0* 03/12/2013   MCV 92.4 03/12/2013   PLT 128 repeated & verified* 03/12/2013      Chemistry      Component Value Date/Time   NA 139 03/05/2013 1417   NA 134* 08/30/2012 0404   K 3.6 03/05/2013 1417   K 4.1 08/30/2012 0404   CL 107 02/05/2013 1108   CL 106 08/30/2012 0404   CO2 22 03/05/2013 1417   CO2 17* 08/30/2012 0404   BUN 9.8 03/05/2013 1417   BUN 6 08/30/2012 0404   CREATININE 0.9 03/05/2013 1417   CREATININE 0.75 08/30/2012 0404      Component Value  Date/Time   CALCIUM 8.0* 03/05/2013 1417   CALCIUM 8.4 08/30/2012 0404   ALKPHOS 101 03/05/2013 1417   ALKPHOS 99 08/28/2012 0430   AST 26 03/05/2013 1417   AST 17 08/28/2012 0430   ALT 13 03/05/2013 1417   ALT 29 08/28/2012 0430   BILITOT 0.45 03/05/2013 1417   BILITOT 0.3 08/28/2012 0430       RADIOGRAPHIC STUDIES:    ASSESSMENT: 67 year old gentleman with  #1 periampullary carcinoma stage III patient is status post Whipple procedure overall she's done well with it. You will not receive adjuvant chemotherapy initially consisting of Gemzar for one cycle. He will then go on to receive concomitant chemoradiation with chemotherapy consisting of xeloda. Risks and benefits of treatment have been discussed with them completely.  #2 patient has now completed the initial chemotherapy of one cycle of Gemzar. Unfortunately his course was complicated by development of abdominal pain weakness and fatigue and therefore he was admitted to the hospital with a small bowel obstruction this is now resolved.  #3 patient has completed concurrent radiation therapy and Xeloda. Overall he tolerated it well. He started the radiation 09/24/2012 through 10/31/2012. Concurrently he did receive escalating doses of Xeloda but maximal dose of we got into him was 1500 mg daily Monday through Friday.  #4 patient began adjuvant Gemzar on 11/20/2012. His dosing was thousand milligrams per meter squared. He tolerated it well. He is now here for cycle  4 of Gemzar only.we will dose reduce 800 mg/meter squared.He has had significant myelosuppression so we have reduced his doses.  #5 Abdominal pain persistent and recurring associated with nausea and vomiting CT with possible C.diff colitis  #6 dehydration: Improved  #7 malnutrition: improved    PLAN:  #1 patient is doing well after cycle 10 of Gemzar only.  #2 he will return on 8/15 for cycle 11 of Gemzar.  #3 in the meantime he is going to be visiting his family up  [place].   All questions were answered. The patient knows to call the clinic with any problems, questions or concerns. We can certainly see the patient much sooner if necessary.  I spent 25 minutes counseling the patient face to face. The total time spent in the appointment was 30 minutes.  Drue Second, MD Medical/Oncology [facility] [phone] (beeper) [phone] (Office)

## 2013-04-03 ENCOUNTER — Ambulatory Visit (HOSPITAL_BASED_OUTPATIENT_CLINIC_OR_DEPARTMENT_OTHER): Payer: Medicare Other

## 2013-04-03 ENCOUNTER — Telehealth: Payer: Self-pay | Admitting: Oncology

## 2013-04-03 ENCOUNTER — Encounter: Payer: Self-pay | Admitting: Oncology

## 2013-04-03 ENCOUNTER — Ambulatory Visit (HOSPITAL_BASED_OUTPATIENT_CLINIC_OR_DEPARTMENT_OTHER): Payer: Medicare Other | Admitting: Oncology

## 2013-04-03 ENCOUNTER — Other Ambulatory Visit (HOSPITAL_BASED_OUTPATIENT_CLINIC_OR_DEPARTMENT_OTHER): Payer: Medicare Other

## 2013-04-03 VITALS — BP 100/65 | HR 68 | Temp 97.9°F | Resp 18 | Ht 66.0 in | Wt 132.6 lb

## 2013-04-03 DIAGNOSIS — Z5111 Encounter for antineoplastic chemotherapy: Secondary | ICD-10-CM

## 2013-04-03 DIAGNOSIS — C241 Malignant neoplasm of ampulla of Vater: Secondary | ICD-10-CM

## 2013-04-03 DIAGNOSIS — R109 Unspecified abdominal pain: Secondary | ICD-10-CM

## 2013-04-03 LAB — CBC WITH DIFFERENTIAL/PLATELET
BASO%: 0.2 % (ref 0.0–2.0)
Basophils Absolute: 0 10*3/uL (ref 0.0–0.1)
EOS%: 2.1 % (ref 0.0–7.0)
HCT: 38.1 % — ABNORMAL LOW (ref 38.4–49.9)
HGB: 12.8 g/dL — ABNORMAL LOW (ref 13.0–17.1)
LYMPH%: 32.5 % (ref 14.0–49.0)
MCH: 30.9 pg (ref 27.2–33.4)
MCHC: 33.6 g/dL (ref 32.0–36.0)
MCV: 92 fL (ref 79.3–98.0)
MONO%: 11.4 % (ref 0.0–14.0)
NEUT%: 53.8 % (ref 39.0–75.0)
Platelets: 151 10*3/uL (ref 140–400)
lymph#: 1.4 10*3/uL (ref 0.9–3.3)

## 2013-04-03 LAB — COMPREHENSIVE METABOLIC PANEL (CC13)
AST: 31 U/L (ref 5–34)
Albumin: 3 g/dL — ABNORMAL LOW (ref 3.5–5.0)
Alkaline Phosphatase: 120 U/L (ref 40–150)
BUN: 21 mg/dL (ref 7.0–26.0)
Creatinine: 1 mg/dL (ref 0.7–1.3)
Glucose: 94 mg/dl (ref 70–140)
Total Bilirubin: 0.38 mg/dL (ref 0.20–1.20)

## 2013-04-03 MED ORDER — HEPARIN SOD (PORK) LOCK FLUSH 100 UNIT/ML IV SOLN
500.0000 [IU] | Freq: Once | INTRAVENOUS | Status: AC | PRN
  Administered 2013-04-03: 500 [IU]
  Filled 2013-04-03: qty 5

## 2013-04-03 MED ORDER — PROCHLORPERAZINE MALEATE 10 MG PO TABS
10.0000 mg | ORAL_TABLET | Freq: Once | ORAL | Status: AC
  Administered 2013-04-03: 10 mg via ORAL

## 2013-04-03 MED ORDER — SODIUM CHLORIDE 0.9 % IV SOLN
800.0000 mg/m2 | Freq: Once | INTRAVENOUS | Status: AC
  Administered 2013-04-03: 1368 mg via INTRAVENOUS
  Filled 2013-04-03: qty 35.98

## 2013-04-03 MED ORDER — SODIUM CHLORIDE 0.9 % IJ SOLN
10.0000 mL | INTRAMUSCULAR | Status: DC | PRN
  Administered 2013-04-03: 10 mL
  Filled 2013-04-03: qty 10

## 2013-04-03 MED ORDER — SODIUM CHLORIDE 0.9 % IV SOLN
Freq: Once | INTRAVENOUS | Status: AC
  Administered 2013-04-03: 17:00:00 via INTRAVENOUS

## 2013-04-03 NOTE — Telephone Encounter (Signed)
, °

## 2013-04-13 NOTE — Progress Notes (Signed)
OFFICE PROGRESS NOTE  CC  Colin Rodriguez, MD [address] 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 He underwent pancreatic code duodenectomy. His final pathology revealed a well to moderately differentiated adenocarcinoma the tumor arose from high grade dysplasia involving small bowel mucosa in the ampulla invaded into the pancreas margins. The margins were negative LV I was noted one lymph node was positive for metastatic disease additional 14 lymph nodes were negative. Portal lymph node resection revealed 5 benign lymph nodes. Pathologically T3 N1.  #2 patient completed adjuvant Gemzar for one cycle prior to start of radiation therapy he tolerated it well.   #3 he completed adjuvant radiation with radiosensitizing Xeloda on 10/31/2012. He tolerated the dose well  #4 began adjuvant single agent Gemzar 3 weeks on one week off beginning 11/20/2012. We'll plan on giving him a total of 3 cycles.  CURRENT THERAPY: Here for tCycle 11 of single agent Gemzar  INTERVAL HISTORY: Colin Castro 67 y.o. male returns for followup visit to get next cycle of chemotherapy. He is doing significantly better in comparison to his prior visit. He doesn't have as much nausea or vomiting he's gained some weight he is eating. He has no peripheral paresthesias no diarrhea or constipation.  Remainder of the 10 point review of systems is negative.  MEDICAL HISTORY:  Past Medical History  Diagnosis Date  . Hypertension   . High cholesterol   . Coronary artery disease   . Common bile duct (CBD) stricture     Malignant  . 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 Pneumonia   . Constipation    . Myocardial infarction     15 years ago    ALLERGIES:  has No Known Allergies.  MEDICATIONS:  Current Outpatient Prescriptions  Medication Sig Dispense Refill  . acetaminophen (TYLENOL) 500 MG tablet Take 500 mg by mouth every 6 (six) hours as needed. For pain      . azithromycin (ZITHROMAX) 250 MG tablet       . benzonatate (TESSALON) 200 MG capsule Take 1 capsule (200 mg total) by mouth 3 (three) times daily as needed for cough.  20 capsule  3  . ciprofloxacin (CIPRO) 500 MG tablet Take 1 tablet (500 mg total) by mouth 2 (two) times daily.  14 tablet  0  . dronabinol (MARINOL) 2.5 MG capsule Take 1 capsule (2.5 mg total) by mouth 2 (two) times daily before a meal.  30 capsule  3  . lidocaine-prilocaine (EMLA) cream Apply topically as needed. Apply to port site 1-2 hours prior to chemotherapy appt.  30 g  3  . lidocaine-prilocaine (EMLA) cream Apply topically as needed. Apply to port 1-2 hours before procedure  30 g  2  . lipase/protease/amylase (CREON) 12000 UNITS CPEP Take 2 capsules by mouth 3 (three) times daily before meals.  180 capsule  12  . megestrol (MEGACE) 40 MG/ML suspension       . metoprolol tartrate (LOPRESSOR) 25 MG tablet       . metroNIDAZOLE (FLAGYL) 500 MG tablet Take 1 tablet (500 mg total) by mouth 3 (three) times daily.  21 tablet  1  . Multiple Vitamin (MULTIVITAMIN WITH MINERALS) TABS Take 1 tablet by mouth daily.      [name]  ondansetron (ZOFRAN) 8 MG tablet Take 1 tablet (8 mg total) by mouth every 8 (eight) hours as needed. For nausea  30 tablet  3  . oxyCODONE-acetaminophen (PERCOCET/ROXICET) 5-325 MG per tablet       . pantoprazole (PROTONIX) 40 MG tablet Take 1 tablet (40 mg total) by mouth 2 (two) times daily at 10 AM and 5 PM.  60 tablet  6  . polyethylene glycol powder (MIRALAX) powder Take 17 g by mouth daily.  255 g  4  . pravastatin (PRAVACHOL) 40 MG tablet        Current Facility-Administered Medications  Medication Dose Route Frequency Provider Last  Rate Last Dose  . sodium chloride 0.9 % injection 10 mL  10 mL Intracatheter PRN Victorino December, MD   10 mL at 04/03/13 1734   Facility-Administered Medications Ordered in Other Visits  Medication Dose Route Frequency Provider Last Rate Last Dose  . sodium chloride 0.9 % injection 10 mL  10 mL Intravenous PRN Victorino December, MD   10 mL at 10/30/12 1409    SURGICAL HISTORY:  Past Surgical History  Procedure Laterality Date  . Ercp  05/07/2012    Procedure: ENDOSCOPIC RETROGRADE CHOLANGIOPANCREATOGRAPHY (ERCP);  Surgeon: [name], MD;  Location: [facility];  Service: Endoscopy;  Laterality: N/A;  Dr [name]  . Eus  05/07/2012    Procedure: UPPER ENDOSCOPIC ULTRASOUND (EUS) RADIAL;  Surgeon: [name], MD;  Location: [facility];  Service: Endoscopy;;  . Appendectomy      open, 25 yrs ago  . Coronary stent placement      2 stents placed  . Whipple procedure  06/06/2012    Procedure: WHIPPLE PROCEDURE;  Surgeon: [name], MD;  Location: [facility] OR;  Service: General;  Laterality: N/A;  Pancreatico duodenectomy  . Cholecystectomy  06/06/2012    Procedure: CHOLECYSTECTOMY;  Surgeon: [name], MD;  Location: [facility] OR;  Service: General;  Laterality: N/A;  . Pancreatic stent placement  06/06/2012    Procedure: PANCREATIC STENT PLACEMENT;  Surgeon: [name], MD;  Location: [facility] OR;  Service: General;  Laterality: N/A;  Removal of biliary stent; Placement of pacreatic stent  . Lymph node dissection  06/06/2012    Procedure: LYMPH NODE DISSECTION;  Surgeon: [name], MD;  Location: [facility] OR;  Service: General;  Laterality: N/A;  Portal lymph node dissection  . Coronary angioplasty      8 or 9 years ago  . Portacath placement  07/24/2012    Procedure: INSERTION PORT-A-CATH;  Surgeon: [name], MD;  Location: [facility] OR;  Service: General;  Laterality: Left;    REVIEW OF SYSTEMS:   General: fatigue (+), night sweats (-), fever (-), pain (-) Lymph: palpable nodes (-) HEENT: vision  changes (-), mucositis (-), gum bleeding (-), epistaxis (-) Cardiovascular: chest pain (-), palpitations (-) Pulmonary: shortness of breath (-), dyspnea on exertion (-), cough (-), hemoptysis (-) GI:  Early satiety (-), melena (-), dysphagia (-), nausea/vomiting (-), diarrhea (-) GU: dysuria (-), hematuria (-), incontinence (-) Musculoskeletal: joint swelling (-), joint pain (-), back pain (-) Neuro: weakness (-), numbness (-), headache (-), confusion (-) Skin: Rash (-), lesions (-), dryness (-) Psych: depression (-), suicidal/homicidal ideation (-), feeling of hopelessness (-)   PHYSICAL EXAMINATION: Blood pressure 100/65, pulse 68, temperature 97.9 F (36.6 C), temperature source Oral, resp. rate 18, height 5\' 6"  (1.676 m), weight 132 lb 9.6 oz (60.147 kg), SpO2 98.00%. Body mass index is 21.41 kg/(m^2). General: Patient is  a well appearing male in no acute distress HEENT: PERRLA, sclerae anicteric no conjunctival pallor, MMM Neck: supple, no palpable adenopathy Lungs: clear to auscultation bilaterally, no wheezes, rhonchi, or rales Cardiovascular: tachycardic, regular rhythm, S1, S2, no murmurs, rubs or gallops Abdomen: Soft, non-distended, normoactive bowel sounds, no HSM, extremely tender to palpation, particularly the RLQ, with rebound.  Patient guarding abdomen when moving on exam table and grimacing while moving.  Extremities: warm and well perfused, no clubbing, cyanosis, or edema Skin: No rashes or lesions Neuro: Non-focal  ECOG PERFORMANCE STATUS: 1 - Symptomatic but completely ambulatory   LABORATORY DATA: Lab Results  Component Value Date   WBC 4.4 04/03/2013   HGB 12.8* 04/03/2013   HCT 38.1* 04/03/2013   MCV 92.0 04/03/2013   PLT 151 04/03/2013      Chemistry      Component Value Date/Time   NA 141 04/03/2013 1442   NA 134* 08/30/2012 0404   K 4.1 04/03/2013 1442   K 4.1 08/30/2012 0404   CL 107 02/05/2013 1108   CL 106 08/30/2012 0404   CO2 16* 04/03/2013 1442   CO2  17* 08/30/2012 0404   BUN 21.0 04/03/2013 1442   BUN 6 08/30/2012 0404   CREATININE 1.0 04/03/2013 1442   CREATININE 0.75 08/30/2012 0404      Component Value Date/Time   CALCIUM 8.7 04/03/2013 1442   CALCIUM 8.4 08/30/2012 0404   ALKPHOS 120 04/03/2013 1442   ALKPHOS 99 08/28/2012 0430   AST 31 04/03/2013 1442   AST 17 08/28/2012 0430   ALT 32 04/03/2013 1442   ALT 29 08/28/2012 0430   BILITOT 0.38 04/03/2013 1442   BILITOT 0.3 08/28/2012 0430       RADIOGRAPHIC STUDIES:    ASSESSMENT: 67 year old gentleman with  #1 periampullary carcinoma stage III patient is status post Whipple procedure overall she's done well with it. You will not receive adjuvant chemotherapy initially consisting of Gemzar for one cycle. He will then go on to receive concomitant chemoradiation with chemotherapy consisting of xeloda. Risks and benefits of treatment have been discussed with them completely.  #2 patient has now completed the initial chemotherapy of one cycle of Gemzar. Unfortunately his course was complicated by development of abdominal pain weakness and fatigue and therefore he was admitted to the hospital with a small bowel obstruction this is now resolved.  #3 patient has completed concurrent radiation therapy and Xeloda. Overall he tolerated it well. He started the radiation 09/24/2012 through 10/31/2012. Concurrently he did receive escalating doses of Xeloda but maximal dose of we got into him was 1500 mg daily Monday through Friday.  #4 patient began adjuvant Gemzar on 11/20/2012. His dosing was thousand milligrams per meter squared. He tolerated it well. He is now here for cycle  4 of Gemzar only.we will dose reduce 800 mg/meter squared.He has had significant myelosuppression so we have reduced his doses.  #5 Abdominal pain persistent and recurring associated with nausea and vomiting CT with possible C.diff colitis  #6 dehydration: Improved  #7 malnutrition: improved    PLAN:  #1 proceed with cycle  #11 of Gemzar. This is his final treatment.  #2 he will be seen back in 3 months time for followup. At that time we will plan on doing restaging studies.  All questions were answered. The patient knows to call the clinic with any problems, questions or concerns. We can certainly see the patient much sooner if necessary.  I spent 25 minutes counseling the patient face  to face. The total time spent in the appointment was 30 minutes.  Drue Second, MD Medical/Oncology [facility] [phone] (beeper) [phone] (Office)

## 2013-06-05 ENCOUNTER — Other Ambulatory Visit (HOSPITAL_BASED_OUTPATIENT_CLINIC_OR_DEPARTMENT_OTHER): Payer: Medicare Other | Admitting: Lab

## 2013-06-05 ENCOUNTER — Ambulatory Visit (HOSPITAL_BASED_OUTPATIENT_CLINIC_OR_DEPARTMENT_OTHER): Payer: Medicare Other | Admitting: Oncology

## 2013-06-05 ENCOUNTER — Telehealth: Payer: Self-pay | Admitting: *Deleted

## 2013-06-05 ENCOUNTER — Ambulatory Visit (HOSPITAL_BASED_OUTPATIENT_CLINIC_OR_DEPARTMENT_OTHER): Payer: Medicare Other

## 2013-06-05 VITALS — BP 122/72 | HR 70 | Temp 98.4°F | Resp 20 | Ht 66.0 in | Wt 130.9 lb

## 2013-06-05 DIAGNOSIS — C241 Malignant neoplasm of ampulla of Vater: Secondary | ICD-10-CM

## 2013-06-05 DIAGNOSIS — C7889 Secondary malignant neoplasm of other digestive organs: Secondary | ICD-10-CM

## 2013-06-05 LAB — CBC WITH DIFFERENTIAL/PLATELET
BASO%: 0.8 % (ref 0.0–2.0)
Basophils Absolute: 0 10*3/uL (ref 0.0–0.1)
EOS%: 1.6 % (ref 0.0–7.0)
HCT: 35.9 % — ABNORMAL LOW (ref 38.4–49.9)
HGB: 12 g/dL — ABNORMAL LOW (ref 13.0–17.1)
MCH: 29.7 pg (ref 27.2–33.4)
MCHC: 33.5 g/dL (ref 32.0–36.0)
MCV: 88.5 fL (ref 79.3–98.0)
MONO%: 12 % (ref 0.0–14.0)
NEUT%: 61 % (ref 39.0–75.0)
RDW: 14.2 % (ref 11.0–14.6)
lymph#: 1.3 10*3/uL (ref 0.9–3.3)

## 2013-06-05 LAB — COMPREHENSIVE METABOLIC PANEL (CC13)
ALT: 21 U/L (ref 0–55)
AST: 25 U/L (ref 5–34)
Alkaline Phosphatase: 135 U/L (ref 40–150)
BUN: 9.5 mg/dL (ref 7.0–26.0)
Chloride: 109 mEq/L (ref 98–109)
Creatinine: 0.9 mg/dL (ref 0.7–1.3)

## 2013-06-05 MED ORDER — HEPARIN SOD (PORK) LOCK FLUSH 100 UNIT/ML IV SOLN
500.0000 [IU] | Freq: Once | INTRAVENOUS | Status: AC
  Administered 2013-06-05: 500 [IU] via INTRAVENOUS
  Filled 2013-06-05: qty 5

## 2013-06-05 MED ORDER — SODIUM CHLORIDE 0.9 % IJ SOLN
10.0000 mL | INTRAMUSCULAR | Status: DC | PRN
  Administered 2013-06-05: 10 mL via INTRAVENOUS
  Filled 2013-06-05: qty 10

## 2013-06-05 MED ORDER — OXYCODONE-ACETAMINOPHEN 5-325 MG PO TABS: 1.0000 | ORAL_TABLET | ORAL | Status: AC | PRN

## 2013-06-05 NOTE — Telephone Encounter (Signed)
appts made and printed...td 

## 2013-06-05 NOTE — Progress Notes (Signed)
OFFICE PROGRESS NOTE  CC  Colin Rodriguez, MD [address] 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 He underwent pancreatic code duodenectomy. His final pathology revealed a well to moderately differentiated adenocarcinoma the tumor arose from high grade dysplasia involving small bowel mucosa in the ampulla invaded into the pancreas margins. The margins were negative LV I was noted one lymph node was positive for metastatic disease additional 14 lymph nodes were negative. Portal lymph node resection revealed 5 benign lymph nodes. Pathologically T3 N1.  #2 patient completed adjuvant Gemzar for one cycle prior to start of radiation therapy he tolerated it well.   #3 he completed adjuvant radiation with radiosensitizing Xeloda on 10/31/2012. He tolerated the dose well  #4 began adjuvant single agent Gemzar 3 weeks on one week off beginning 11/20/2012 - 04/03/13  CURRENT THERAPY: observation  INTERVAL HISTORY: Colin Castro 67 y.o. male returns for followup visit.Colin Castro He is doing significantly better in comparison to his prior visit. He doesn't have  nausea or vomiting he's gained some weight he is eating. He has no peripheral paresthesias no diarrhea or constipation.  Remainder of the 10 point review of systems is negative.  MEDICAL HISTORY:  Past Medical History  Diagnosis Date  . Hypertension   . High cholesterol   . Coronary artery disease   . Common bile duct (CBD) stricture     Malignant  . 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 Pneumonia   . Constipation   . Myocardial infarction     15 years ago    ALLERGIES:  has No Known Allergies.  MEDICATIONS:   Current Outpatient Prescriptions  Medication Sig Dispense Refill  . acetaminophen (TYLENOL) 500 MG tablet Take 500 mg by mouth every 6 (six) hours as needed. For pain      . lipase/protease/amylase (CREON) 12000 UNITS CPEP Take 2 capsules by mouth 3 (three) times daily before meals.  180 capsule  12  . metoprolol tartrate (LOPRESSOR) 25 MG tablet       . oxyCODONE-acetaminophen (PERCOCET/ROXICET) 5-325 MG per tablet       . traMADol (ULTRAM) 50 MG tablet       . chlorhexidine (PERIDEX) 0.12 % solution       . dronabinol (MARINOL) 2.5 MG capsule Take 1 capsule (2.5 mg total) by mouth 2 (two) times daily before a meal.  30 capsule  3  . lidocaine-prilocaine (EMLA) cream Apply topically as needed. Apply to port site 1-2 hours prior to chemotherapy appt.  30 g  3  . megestrol (MEGACE) 40 MG/ML suspension       . ondansetron (ZOFRAN) 8 MG tablet Take 1 tablet (8 mg total) by mouth every 8 (eight) hours as needed. For nausea  30 tablet  3  . pantoprazole (PROTONIX) 40 MG tablet Take 1 tablet (40 mg total) by mouth 2 (two) times daily at 10 AM and 5 PM.  60 tablet  6  . polyethylene glycol powder (MIRALAX) powder Take 17 g by mouth daily.  255 g  4  . pravastatin (PRAVACHOL) 40 MG tablet        No current facility-administered medications for this visit.   Facility-Administered Medications Ordered in Other Visits  Medication Dose Route  Frequency Provider Last Rate Last Dose  . sodium chloride 0.9 % injection 10 mL  10 mL Intravenous PRN Victorino December, MD   10 mL at 10/30/12 1409    SURGICAL HISTORY:  Past Surgical History  Procedure Laterality Date  . Ercp  05/07/2012    Procedure: ENDOSCOPIC RETROGRADE CHOLANGIOPANCREATOGRAPHY (ERCP);  Surgeon: [name], MD;  Location: [facility];  Service: Endoscopy;  Laterality: N/A;  Dr [name]  . Eus  05/07/2012    Procedure: UPPER ENDOSCOPIC ULTRASOUND (EUS) RADIAL;  Surgeon: [name], MD;  Location: [facility];  Service: Endoscopy;;  .  Appendectomy      open, 25 yrs ago  . Coronary stent placement      2 stents placed  . Whipple procedure  06/06/2012    Procedure: WHIPPLE PROCEDURE;  Surgeon: [name], MD;  Location: [facility] OR;  Service: General;  Laterality: N/A;  Pancreatico duodenectomy  . Cholecystectomy  06/06/2012    Procedure: CHOLECYSTECTOMY;  Surgeon: [name], MD;  [facility] OR;  Service: General;  Laterality: N/A;  . Pancreatic stent placement  06/06/2012    Procedure: PANCREATIC STENT PLACEMENT;  Surgeon: [name], MD;  Location: [facility] OR;  Service: General;  Laterality: N/A;  Removal of biliary stent; Placement of pacreatic stent  . Lymph node dissection  06/06/2012    Procedure: LYMPH NODE DISSECTION;  Surgeon: [name], MD;  Location: [facility] OR;  Service: General;  Laterality: N/A;  Portal lymph node dissection  . Coronary angioplasty      8 or 9 years ago  . Portacath placement  07/24/2012    Procedure: INSERTION PORT-A-CATH;  Surgeon: [name], MD;  Location: [facility] OR;  Service: General;  Laterality: Left;    REVIEW OF SYSTEMS:   General: fatigue (+), night sweats (-), fever (-), pain (-) Lymph: palpable nodes (-) HEENT: vision changes (-), mucositis (-), gum bleeding (-), epistaxis (-) Cardiovascular: chest pain (-), palpitations (-) Pulmonary: shortness of breath (-), dyspnea on exertion (-), cough (-), hemoptysis (-) GI:  Early satiety (-), melena (-), dysphagia (-), nausea/vomiting (-), diarrhea (-) GU: dysuria (-), hematuria (-), incontinence (-) Musculoskeletal: joint swelling (-), joint pain (-), back pain (-) Neuro: weakness (-), numbness (-), headache (-), confusion (-) Skin: Rash (-), lesions (-), dryness (-) Psych: depression (-), suicidal/homicidal ideation (-), feeling of hopelessness (-)   PHYSICAL EXAMINATION: Blood pressure 122/72, pulse 70, temperature 98.4 F (36.9 C), temperature source Oral, resp. rate 20, height 5\' 6"  (1.676 m), weight 130 lb 14.4 oz (59.376 kg). Body  mass index is 21.14 kg/(m^2). General: Patient is a well appearing male in no acute distress HEENT: PERRLA, sclerae anicteric no conjunctival pallor, MMM Neck: supple, no palpable adenopathy Lungs: clear to auscultation bilaterally, no wheezes, rhonchi, or rales Cardiovascular: tachycardic, regular rhythm, S1, S2, no murmurs, rubs or gallops Abdomen: Soft, non-distended, normoactive bowel sounds, no HSM, extremely tender to palpation, particularly the RLQ, with rebound.  Patient guarding abdomen when moving on exam table and grimacing while moving.  Extremities: warm and well perfused, no clubbing, cyanosis, or edema Skin: No rashes or lesions Neuro: Non-focal  ECOG PERFORMANCE STATUS: 1 - Symptomatic but completely ambulatory   LABORATORY DATA: Lab Results  Component Value Date   WBC 5.2 06/05/2013   HGB 12.0* 06/05/2013   HCT 35.9* 06/05/2013   MCV 88.5 06/05/2013   PLT 108* 06/05/2013      Chemistry      Component Value Date/Time   NA 141  06/05/2013 1149   NA 134* 08/30/2012 0404   K 3.2* 06/05/2013 1149   K 4.1 08/30/2012 0404   CL 107 02/05/2013 1108   CL 106 08/30/2012 0404   CO2 22 06/05/2013 1149   CO2 17* 08/30/2012 0404   BUN 9.5 06/05/2013 1149   BUN 6 08/30/2012 0404   CREATININE 0.9 06/05/2013 1149   CREATININE 0.75 08/30/2012 0404      Component Value Date/Time   CALCIUM 8.5 06/05/2013 1149   CALCIUM 8.4 08/30/2012 0404   ALKPHOS 135 06/05/2013 1149   ALKPHOS 99 08/28/2012 0430   AST 25 06/05/2013 1149   AST 17 08/28/2012 0430   ALT 21 06/05/2013 1149   ALT 29 08/28/2012 0430   BILITOT 0.66 06/05/2013 1149   BILITOT 0.3 08/28/2012 0430       RADIOGRAPHIC STUDIES:    ASSESSMENT: 67 year old gentleman with  #1 periampullary carcinoma stage III patient is status post Whipple procedure overall she's done well with it. You will not receive adjuvant chemotherapy initially consisting of Gemzar for one cycle. He will then go on to receive concomitant chemoradiation  with chemotherapy consisting of xeloda. Risks and benefits of treatment have been discussed with them completely.  #2 patient has now completed the initial chemotherapy of one cycle of Gemzar. Unfortunately his course was complicated by development of abdominal pain weakness and fatigue and therefore he was admitted to the hospital with a small bowel obstruction this is now resolved.  #3 patient has completed concurrent radiation therapy and Xeloda. Overall he tolerated it well. He started the radiation 09/24/2012 through 10/31/2012. Concurrently he did receive escalating doses of Xeloda but maximal dose of we got into him was 1500 mg daily Monday through Friday.  #4 patient began adjuvant Gemzar on 11/20/2012 - 04/03/13 x 11 weeks.  He tolerated it well.      PLAN:  #1 doing well, no clinical evidence f recurrence  #2 he will be seen back in 3 months time for followup. At that time we will plan on doing restaging studies.  All questions were answered. The patient knows to call the clinic with any problems, questions or concerns. We can certainly see the patient much sooner if necessary.  I spent 15 minutes counseling the patient face to face. The total time spent in the appointment was 20 minutes.  Drue Second, MD Medical/Oncology [facility] [phone] (beeper) [phone] (Office)

## 2013-06-08 ENCOUNTER — Telehealth: Payer: Self-pay | Admitting: Oncology

## 2013-06-09 IMAGING — CT CT ABD-PELV W/ CM
1 of 3 series · 14 of 32 positions shown, 19 images · IV contrast (OMNIPAQUE 300)
Comparison: CT abdomen pelvis 05/01/2012.

CLINICAL DATA: Severe abdominal pain.

CT ABDOMEN AND PELVIS WITH CONTRAST
TECHNIQUE: Multidetector CT imaging of the abdomen and pelvis was
performed following the standard protocol during bolus
administration of intravenous contrast.
Contrast: 100mL OMNIPAQUE IOHEXOL 300 MG/ML  SOLN

[Series 2: abd/pel with · axial · 0.82mm/px · z∈[+1019,+1434]mm · 14 of 95 slices shown, 19 images]
[im 6/95  soft-tissue]
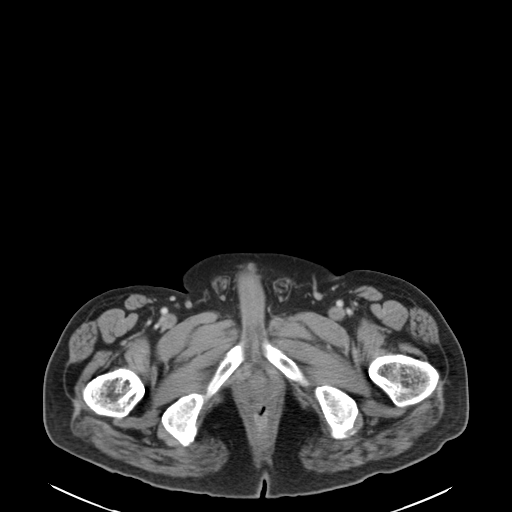
[im 6/95  bone]
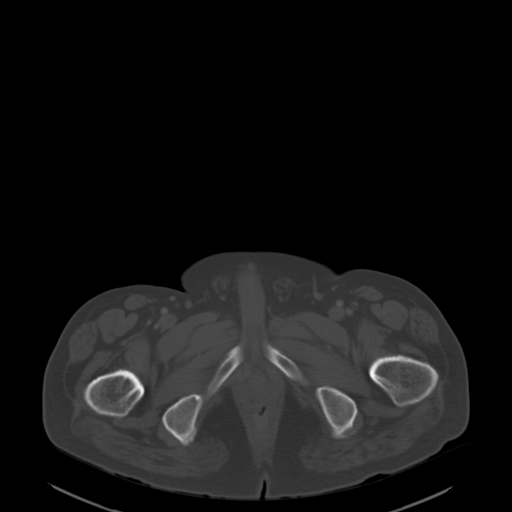
[im 12/95  soft-tissue]
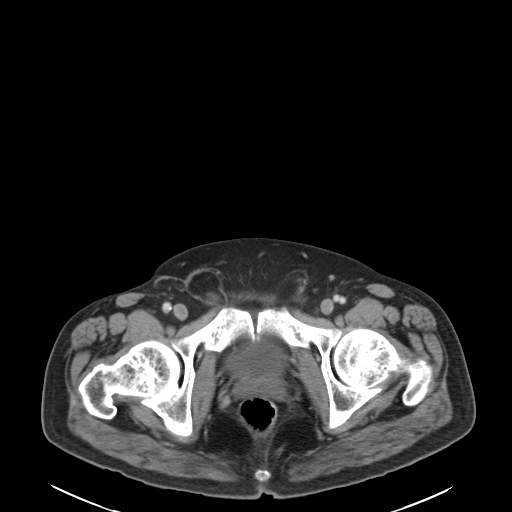
[im 18/95  soft-tissue]
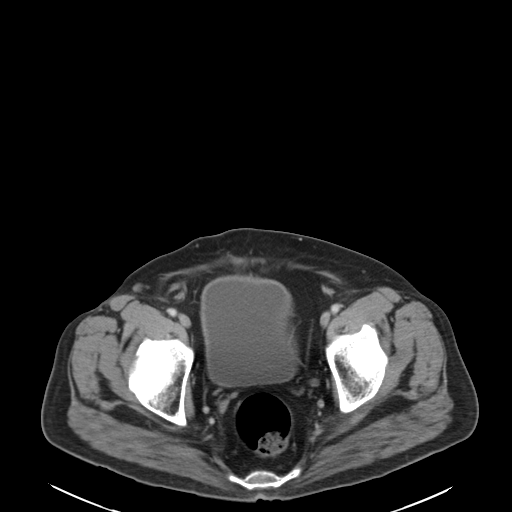
[im 30/95  soft-tissue]
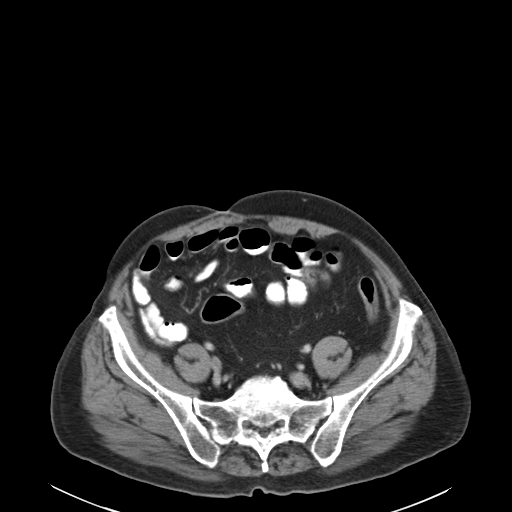
[im 36/95  soft-tissue]
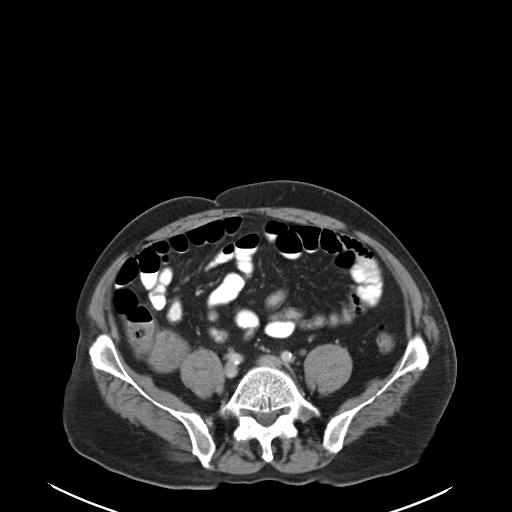
[im 42/95  soft-tissue]
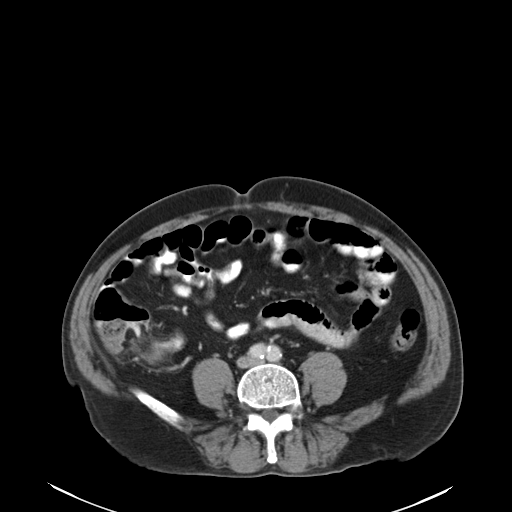
[im 48/95  soft-tissue]
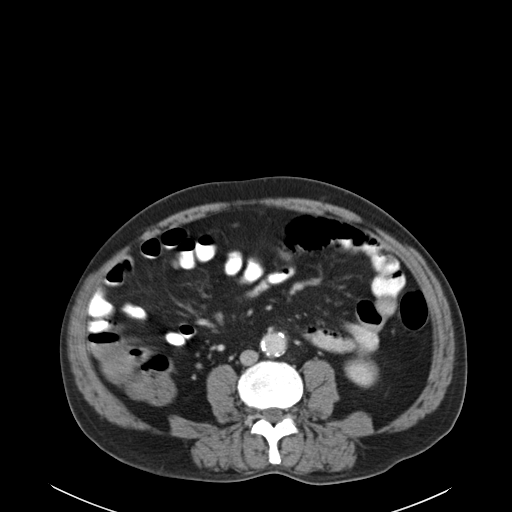
[im 53/95  soft-tissue]
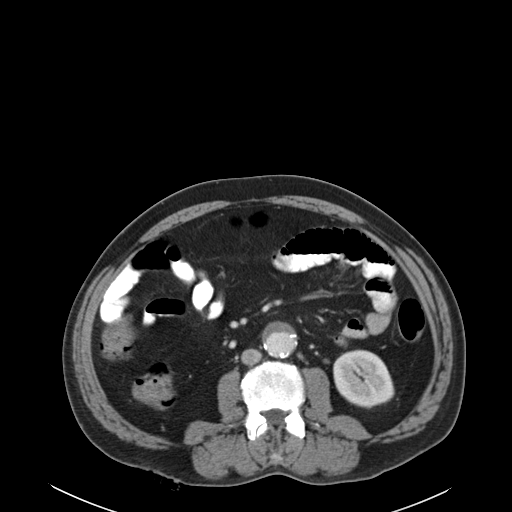
[im 59/95  soft-tissue]
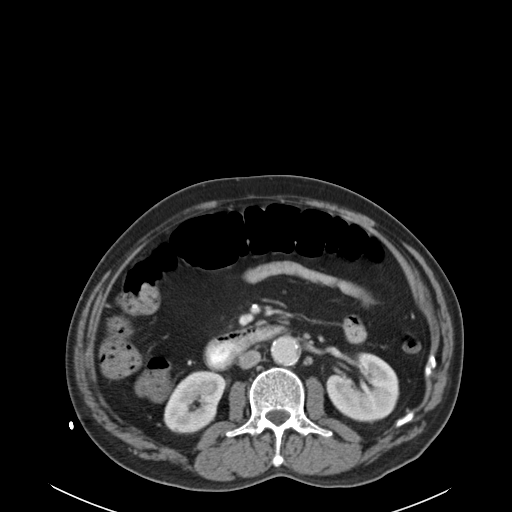
[im 59/95  bone]
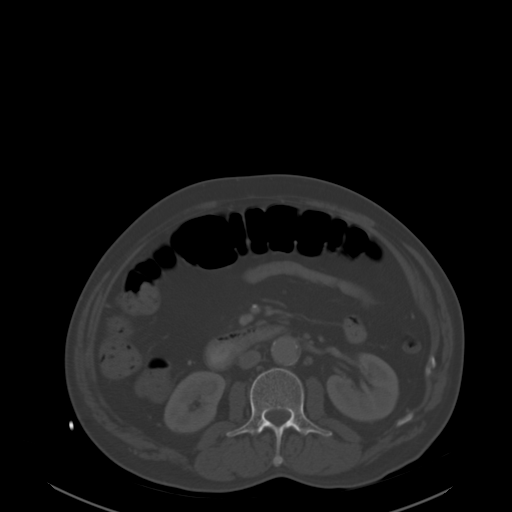
[im 65/95  soft-tissue]
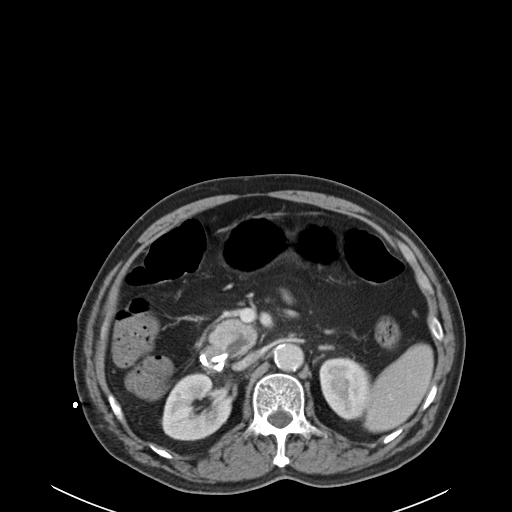
[im 71/95  lung]
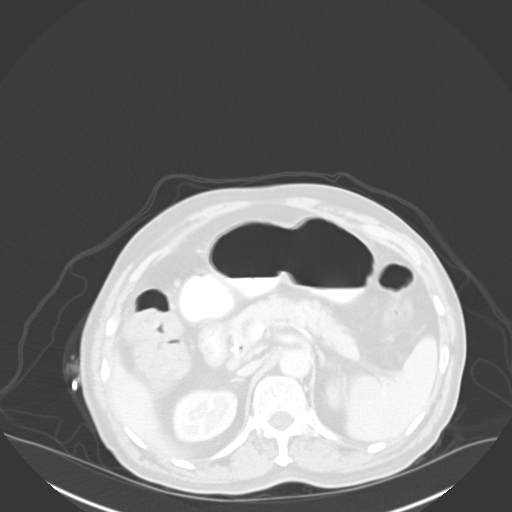
[im 77/95  soft-tissue]
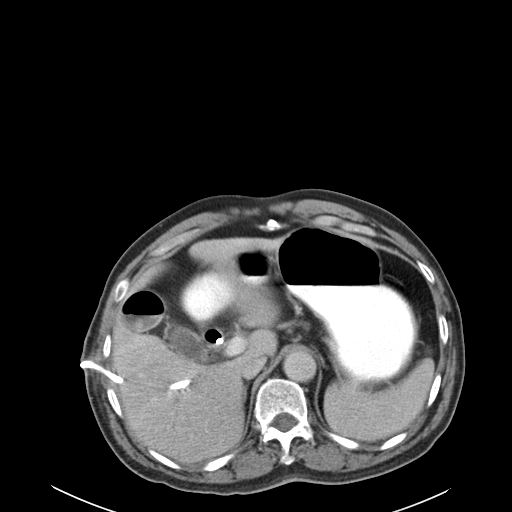
[im 77/95  lung]
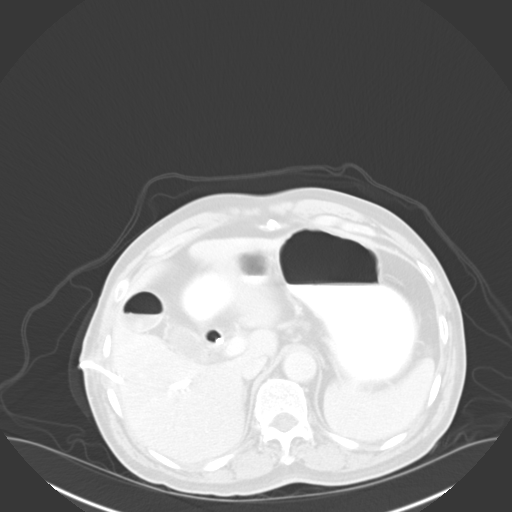
[im 83/95  soft-tissue]
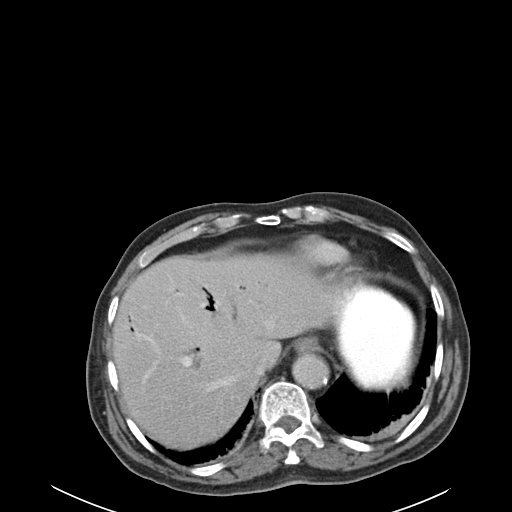
[im 83/95  lung]
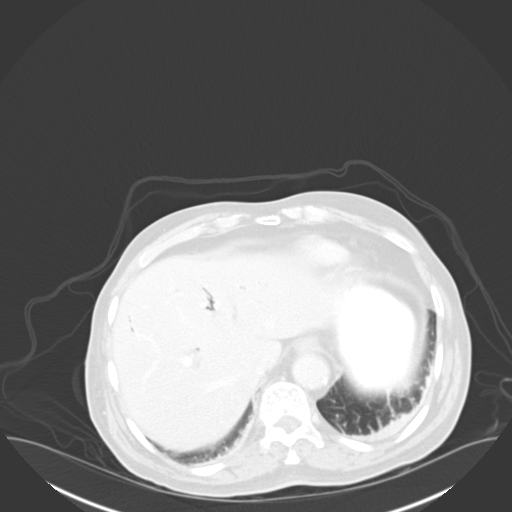
[im 89/95  soft-tissue]
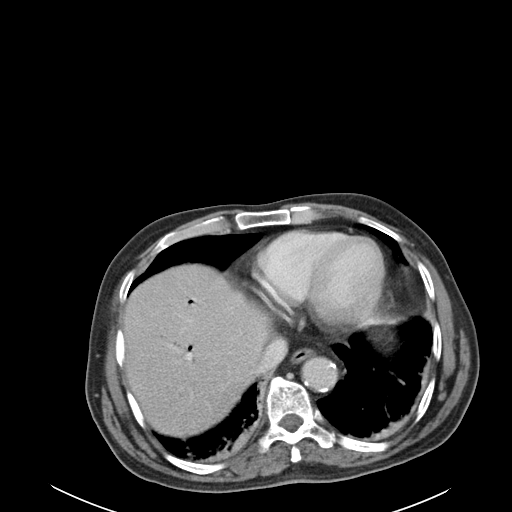
[im 89/95  lung]
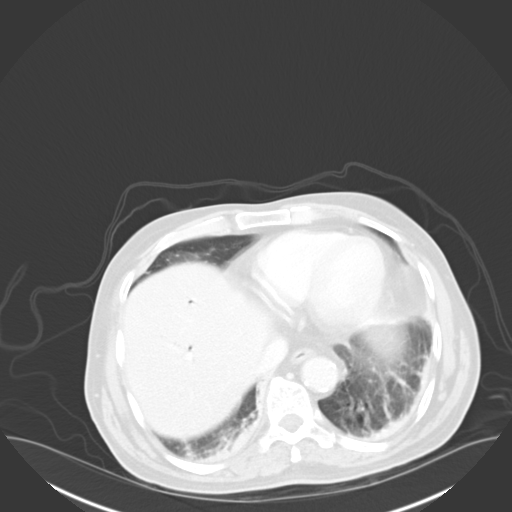

[14 of 32 positions shown; findings below may reference images not displayed]

FINDINGS: Lung bases show atelectasis in both lower lobes.  Heart
size within normal limits.  No pericardial or pleural effusion.

A percutaneous drain courses through the common bile duct,
terminating in the duodenum.  There is resultant pneumobilia.  A
stone is seen in the fundus of the gallbladder.  Adrenal glands are
unremarkable.  Sub centimeter low attenuation lesions in the
kidneys are too small to characterize.  Spleen is unremarkable.
Pancreatic duct is dilated, measuring 4 mm.  No pancreatic atrophy.
Stomach and bowel are unremarkable.

Small bilateral inguinal hernias contain fat.  Right inguinal
hernia contains a small portion of the bladder.  Inguinal lymph
nodes are borderline enlarged, measuring up to 10 mm on the left.
Atherosclerotic calcification of the arterial vasculature.
Infrarenal aorta measures up to 3 cm.  No free fluid.  No worrisome
lytic or sclerotic lesions.
IMPRESSION: 1.  Percutaneous biliary drain in place, terminating in the
duodenum, with pneumobilia. Together with pancreatic ductal
dilatation, an ampullary lesion is questioned.
2.  Cholelithiasis.
3.  Infrarenal aortic aneurysm.
4.  Bilateral inguinal hernias.

## 2013-06-15 ENCOUNTER — Ambulatory Visit (HOSPITAL_BASED_OUTPATIENT_CLINIC_OR_DEPARTMENT_OTHER): Payer: Medicare Other

## 2013-06-15 ENCOUNTER — Other Ambulatory Visit: Payer: Self-pay | Admitting: Adult Health

## 2013-06-15 DIAGNOSIS — Z23 Encounter for immunization: Secondary | ICD-10-CM

## 2013-06-15 DIAGNOSIS — C241 Malignant neoplasm of ampulla of Vater: Secondary | ICD-10-CM

## 2013-06-15 MED ORDER — INFLUENZA VAC SPLIT QUAD 0.5 ML IM SUSP
0.5000 mL | INTRAMUSCULAR | Status: AC
  Administered 2013-06-15: 0.5 mL via INTRAMUSCULAR
  Filled 2013-06-15: qty 0.5

## 2013-06-15 MED ORDER — PNEUMOCOCCAL VAC POLYVALENT 25 MCG/0.5ML IJ INJ
0.5000 mL | INJECTION | Freq: Once | INTRAMUSCULAR | Status: AC
  Administered 2013-06-15: 0.5 mL via INTRAMUSCULAR
  Filled 2013-06-15: qty 0.5

## 2013-06-15 NOTE — Progress Notes (Signed)
Flu and Pneumonia injection given by desk nurse, during office visit.

## 2013-06-15 NOTE — Progress Notes (Signed)
Patient is here accompanying his wife during her follow up appointment for her breast cancer.  He is a known patient of ours requesting a flu vaccine and pneumonia vaccine prior to his trip to [place].  He reports tolerating previous flu vaccines without incident.  He has never had a pneumonia vaccine.    Dr. Welton Flakes saw him as well during his wife's appointment and has reviewed the vaccines and their administration.    Illa Level, NP Medical Oncology [facility] [phone]

## 2013-10-09 ENCOUNTER — Other Ambulatory Visit: Payer: Medicare Other

## 2013-10-09 ENCOUNTER — Ambulatory Visit: Payer: Medicare Other | Admitting: Oncology

## 2013-11-02 ENCOUNTER — Telehealth: Payer: Self-pay

## 2013-11-02 NOTE — Telephone Encounter (Signed)
Charting error.

## 2013-11-12 ENCOUNTER — Telehealth: Payer: Self-pay

## 2013-11-12 NOTE — Telephone Encounter (Signed)
Message copied by Prentiss Bells on Thu Nov 12, 2013  9:18 AM ------      Message from: Deatra Robinson      Created: Mon Nov 09, 2013  3:56 PM       Tried to call Dr. Manus Rudd, she is away            She will call me if she has any questions or concerns            ----- Message -----         From: Hoy Finlay, RN         Sent: 11/03/2013  12:05 PM           To: Deatra Robinson, MD            Family member called and would like you to call the patient's new physician, Dr. Manus Rudd, in [place]. His number is [phone].       ------

## 2013-11-12 NOTE — Telephone Encounter (Signed)
Per note below - KK has attempted call  Awaiting callback.

## 2014-01-05 ENCOUNTER — Telehealth: Payer: Self-pay

## 2014-01-05 NOTE — Telephone Encounter (Signed)
Rcvd by fax Progress Note for pt visit with Dr. Loni Dolly - [facility].  Original to scan.  Copy to [place].

## 2014-04-27 ENCOUNTER — Encounter (HOSPITAL_COMMUNITY): Payer: Self-pay
